# Patient Record
Sex: Male | Born: 1980
Health system: Southern US, Community
[De-identification: ages and names within clinical notes are randomized; demographics above are authoritative.]

## PROBLEM LIST (undated history)

## (undated) DIAGNOSIS — R42 Dizziness and giddiness: Secondary | ICD-10-CM

## (undated) DIAGNOSIS — L02419 Cutaneous abscess of limb, unspecified: Secondary | ICD-10-CM

## (undated) DIAGNOSIS — L03119 Cellulitis of unspecified part of limb: Secondary | ICD-10-CM

## (undated) DIAGNOSIS — D509 Iron deficiency anemia, unspecified: Secondary | ICD-10-CM

## (undated) DIAGNOSIS — K4090 Unilateral inguinal hernia, without obstruction or gangrene, not specified as recurrent: Secondary | ICD-10-CM

## (undated) DIAGNOSIS — E538 Deficiency of other specified B group vitamins: Secondary | ICD-10-CM

## (undated) DIAGNOSIS — Z21 Asymptomatic human immunodeficiency virus [HIV] infection status: Secondary | ICD-10-CM

## (undated) DIAGNOSIS — K219 Gastro-esophageal reflux disease without esophagitis: Secondary | ICD-10-CM

## (undated) DIAGNOSIS — B20 Human immunodeficiency virus [HIV] disease: Secondary | ICD-10-CM

## (undated) HISTORY — DX: Asymptomatic human immunodeficiency virus (hiv) infection status: Z21

## (undated) HISTORY — PX: FRACTURE SURGERY: SHX138

## (undated) HISTORY — DX: Human immunodeficiency virus (HIV) disease: B20

---
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
## 2015-11-25 ENCOUNTER — Encounter (HOSPITAL_COMMUNITY)
Admission: EM | Disposition: A | Discharge status: Skilled Nursing Facility | Payer: Self-pay | Source: Home / Self Care | Attending: Internal Medicine

## 2015-11-25 ENCOUNTER — Encounter (HOSPITAL_COMMUNITY): Payer: Self-pay | Admitting: *Deleted

## 2015-11-25 ENCOUNTER — Observation Stay (HOSPITAL_COMMUNITY): Payer: Medicaid Other | Admitting: Anesthesiology

## 2015-11-25 ENCOUNTER — Inpatient Hospital Stay (HOSPITAL_COMMUNITY)
Admission: EM | Admit: 2015-11-25 | Discharge: 2015-11-28 | DRG: 494 | Disposition: A | Discharge status: Skilled Nursing Facility | Payer: Medicaid Other | Attending: Internal Medicine | Admitting: Internal Medicine

## 2015-11-25 ENCOUNTER — Emergency Department (HOSPITAL_COMMUNITY): Payer: Medicaid Other

## 2015-11-25 ENCOUNTER — Observation Stay (HOSPITAL_COMMUNITY): Payer: Medicaid Other

## 2015-11-25 ENCOUNTER — Inpatient Hospital Stay (HOSPITAL_COMMUNITY)
Admission: AD | Admit: 2015-11-25 | Payer: No Typology Code available for payment source | Source: Ambulatory Visit | Admitting: Orthopedic Surgery

## 2015-11-25 DIAGNOSIS — Z21 Asymptomatic human immunodeficiency virus [HIV] infection status: Secondary | ICD-10-CM | POA: Diagnosis present

## 2015-11-25 DIAGNOSIS — B351 Tinea unguium: Secondary | ICD-10-CM | POA: Diagnosis present

## 2015-11-25 DIAGNOSIS — K4090 Unilateral inguinal hernia, without obstruction or gangrene, not specified as recurrent: Secondary | ICD-10-CM | POA: Insufficient documentation

## 2015-11-25 DIAGNOSIS — S82141A Displaced bicondylar fracture of right tibia, initial encounter for closed fracture: Principal | ICD-10-CM | POA: Diagnosis present

## 2015-11-25 DIAGNOSIS — S82131A Displaced fracture of medial condyle of right tibia, initial encounter for closed fracture: Secondary | ICD-10-CM | POA: Diagnosis present

## 2015-11-25 DIAGNOSIS — R42 Dizziness and giddiness: Secondary | ICD-10-CM | POA: Diagnosis present

## 2015-11-25 DIAGNOSIS — M79604 Pain in right leg: Secondary | ICD-10-CM | POA: Diagnosis present

## 2015-11-25 DIAGNOSIS — S82143A Displaced bicondylar fracture of unspecified tibia, initial encounter for closed fracture: Secondary | ICD-10-CM | POA: Diagnosis present

## 2015-11-25 DIAGNOSIS — S82191A Other fracture of upper end of right tibia, initial encounter for closed fracture: Secondary | ICD-10-CM | POA: Diagnosis present

## 2015-11-25 DIAGNOSIS — Z419 Encounter for procedure for purposes other than remedying health state, unspecified: Secondary | ICD-10-CM

## 2015-11-25 DIAGNOSIS — I872 Venous insufficiency (chronic) (peripheral): Secondary | ICD-10-CM | POA: Diagnosis present

## 2015-11-25 DIAGNOSIS — Y92410 Unspecified street and highway as the place of occurrence of the external cause: Secondary | ICD-10-CM | POA: Diagnosis not present

## 2015-11-25 DIAGNOSIS — M79661 Pain in right lower leg: Secondary | ICD-10-CM

## 2015-11-25 DIAGNOSIS — S82209A Unspecified fracture of shaft of unspecified tibia, initial encounter for closed fracture: Secondary | ICD-10-CM

## 2015-11-25 DIAGNOSIS — B20 Human immunodeficiency virus [HIV] disease: Secondary | ICD-10-CM | POA: Insufficient documentation

## 2015-11-25 HISTORY — PX: EXTERNAL FIXATION LEG: SHX1549

## 2015-11-25 HISTORY — DX: Dizziness and giddiness: R42

## 2015-11-25 LAB — COMPREHENSIVE METABOLIC PANEL WITH GFR
ALT: 27 U/L (ref 17–63)
AST: 63 U/L — ABNORMAL HIGH (ref 15–41)
Albumin: 3.6 g/dL (ref 3.5–5.0)
Alkaline Phosphatase: 71 U/L (ref 38–126)
Anion gap: 8 (ref 5–15)
BUN: 11 mg/dL (ref 6–20)
CO2: 27 mmol/L (ref 22–32)
Calcium: 9.1 mg/dL (ref 8.9–10.3)
Chloride: 102 mmol/L (ref 101–111)
Creatinine, Ser: 0.9 mg/dL (ref 0.61–1.24)
GFR calc Af Amer: >60 mL/min
GFR calc non Af Amer: >60 mL/min
Glucose, Bld: 114 mg/dL — ABNORMAL HIGH (ref 65–99)
Potassium: 5 mmol/L (ref 3.5–5.1)
Sodium: 137 mmol/L (ref 135–145)
Total Bilirubin: 0.8 mg/dL (ref 0.3–1.2)
Total Protein: 7.9 g/dL (ref 6.5–8.1)

## 2015-11-25 LAB — CBC WITH DIFFERENTIAL/PLATELET
Basophils Absolute: 0 K/uL (ref 0.0–0.1)
Basophils Relative: 0 %
Eosinophils Absolute: 0.1 K/uL (ref 0.0–0.7)
Eosinophils Relative: 1 %
HCT: 39.5 % (ref 39.0–52.0)
Hemoglobin: 12 g/dL — ABNORMAL LOW (ref 13.0–17.0)
Lymphocytes Relative: 16 %
Lymphs Abs: 1.3 K/uL (ref 0.7–4.0)
MCH: 24.8 pg — ABNORMAL LOW (ref 26.0–34.0)
MCHC: 30.4 g/dL (ref 30.0–36.0)
MCV: 81.6 fL (ref 78.0–100.0)
Monocytes Absolute: 0.6 K/uL (ref 0.1–1.0)
Monocytes Relative: 7 %
Neutro Abs: 6 K/uL (ref 1.7–7.7)
Neutrophils Relative %: 76 %
Platelets: 244 K/uL (ref 150–400)
RBC: 4.84 MIL/uL (ref 4.22–5.81)
RDW: 13.5 % (ref 11.5–15.5)
WBC: 7.9 K/uL (ref 4.0–10.5)

## 2015-11-25 LAB — BRAIN NATRIURETIC PEPTIDE: B Natriuretic Peptide: 39.8 pg/mL (ref 0.0–100.0)

## 2015-11-25 SURGERY — EXTERNAL FIXATION, LOWER EXTREMITY
Anesthesia: General | Site: Leg Lower | Laterality: Right

## 2015-11-25 MED ORDER — CEFAZOLIN SODIUM-DEXTROSE 2-3 GM-% IV SOLR
2.0000 g | Freq: Four times a day (QID) | INTRAVENOUS | Status: AC
  Administered 2015-11-25 – 2015-11-26 (×3): 2 g via INTRAVENOUS
  Filled 2015-11-25 (×3): qty 50

## 2015-11-25 MED ORDER — ONDANSETRON HCL 4 MG PO TABS: 4.0000 mg | ORAL_TABLET | Freq: Four times a day (QID) | ORAL | Status: DC | PRN

## 2015-11-25 MED ORDER — TETANUS-DIPHTH-ACELL PERTUSSIS 5-2.5-18.5 LF-MCG/0.5 IM SUSP
0.5000 mL | Freq: Once | INTRAMUSCULAR | Status: AC
  Administered 2015-11-25: 0.5 mL via INTRAMUSCULAR
  Filled 2015-11-25: qty 0.5

## 2015-11-25 MED ORDER — FENTANYL CITRATE (PF) 100 MCG/2ML IJ SOLN
50.0000 ug | Freq: Once | INTRAMUSCULAR | Status: AC
Start: 2015-11-25 — End: 2015-11-25
  Administered 2015-11-25: 50 ug via INTRAVENOUS
  Filled 2015-11-25: qty 2

## 2015-11-25 MED ORDER — CEFAZOLIN SODIUM-DEXTROSE 2-3 GM-% IV SOLR: 2.0000 g | INTRAVENOUS | Status: DC

## 2015-11-25 MED ORDER — FENTANYL CITRATE (PF) 100 MCG/2ML IJ SOLN: 25.0000 ug | INTRAMUSCULAR | Status: DC | PRN

## 2015-11-25 MED ORDER — METHOCARBAMOL 500 MG PO TABS
500.0000 mg | ORAL_TABLET | Freq: Four times a day (QID) | ORAL | Status: DC | PRN
  Administered 2015-11-25 – 2015-11-28 (×9): 500 mg via ORAL
  Filled 2015-11-25 (×8): qty 1

## 2015-11-25 MED ORDER — SUCCINYLCHOLINE CHLORIDE 200 MG/10ML IV SOSY
PREFILLED_SYRINGE | INTRAVENOUS | Status: DC | PRN
  Administered 2015-11-25: 120 mg via INTRAVENOUS

## 2015-11-25 MED ORDER — METHOCARBAMOL 1000 MG/10ML IJ SOLN
500.0000 mg | Freq: Four times a day (QID) | INTRAVENOUS | Status: DC | PRN
  Filled 2015-11-25: qty 5

## 2015-11-25 MED ORDER — OXYCODONE HCL 5 MG PO TABS
5.0000 mg | ORAL_TABLET | ORAL | Status: DC | PRN
  Administered 2015-11-25 – 2015-11-27 (×10): 10 mg via ORAL
  Filled 2015-11-25 (×10): qty 2

## 2015-11-25 MED ORDER — ATROPINE SULFATE 1 MG/ML IJ SOLN
INTRAMUSCULAR | Status: DC | PRN
  Administered 2015-11-25: 1 mg via INTRAVENOUS

## 2015-11-25 MED ORDER — DEXAMETHASONE SODIUM PHOSPHATE 4 MG/ML IJ SOLN
INTRAMUSCULAR | Status: DC | PRN
  Administered 2015-11-25: 4 mg via INTRAVENOUS

## 2015-11-25 MED ORDER — ONDANSETRON HCL 4 MG/2ML IJ SOLN: 4.0000 mg | Freq: Four times a day (QID) | INTRAMUSCULAR | Status: DC | PRN

## 2015-11-25 MED ORDER — HYDROMORPHONE HCL 1 MG/ML IJ SOLN
INTRAMUSCULAR | Status: AC
  Filled 2015-11-25: qty 2

## 2015-11-25 MED ORDER — ALBUTEROL SULFATE HFA 108 (90 BASE) MCG/ACT IN AERS
INHALATION_SPRAY | RESPIRATORY_TRACT | Status: DC | PRN
  Administered 2015-11-25: 2 via RESPIRATORY_TRACT

## 2015-11-25 MED ORDER — LIDOCAINE HCL (CARDIAC) 20 MG/ML IV SOLN
INTRAVENOUS | Status: DC | PRN
  Administered 2015-11-25: 80 mg via INTRAVENOUS

## 2015-11-25 MED ORDER — ALBUTEROL SULFATE HFA 108 (90 BASE) MCG/ACT IN AERS
INHALATION_SPRAY | RESPIRATORY_TRACT | Status: AC
  Filled 2015-11-25: qty 6.7

## 2015-11-25 MED ORDER — METOCLOPRAMIDE HCL 5 MG PO TABS: 5.0000 mg | ORAL_TABLET | Freq: Three times a day (TID) | ORAL | Status: DC | PRN

## 2015-11-25 MED ORDER — MORPHINE SULFATE (PF) 4 MG/ML IV SOLN
4.0000 mg | Freq: Once | INTRAVENOUS | Status: AC
  Administered 2015-11-25: 4 mg via INTRAVENOUS
  Filled 2015-11-25: qty 1

## 2015-11-25 MED ORDER — PROPOFOL 10 MG/ML IV BOLUS
INTRAVENOUS | Status: AC
  Filled 2015-11-25: qty 20

## 2015-11-25 MED ORDER — METHOCARBAMOL 500 MG PO TABS
ORAL_TABLET | ORAL | Status: AC
  Filled 2015-11-25: qty 1

## 2015-11-25 MED ORDER — ONDANSETRON HCL 4 MG/2ML IJ SOLN
INTRAMUSCULAR | Status: DC | PRN
  Administered 2015-11-25: 4 mg via INTRAVENOUS

## 2015-11-25 MED ORDER — MIDAZOLAM HCL 2 MG/2ML IJ SOLN
INTRAMUSCULAR | Status: AC
  Filled 2015-11-25: qty 2

## 2015-11-25 MED ORDER — 0.9 % SODIUM CHLORIDE (POUR BTL) OPTIME
TOPICAL | Status: DC | PRN
  Administered 2015-11-25: 1000 mL

## 2015-11-25 MED ORDER — METOCLOPRAMIDE HCL 5 MG/ML IJ SOLN: 5.0000 mg | Freq: Three times a day (TID) | INTRAMUSCULAR | Status: DC | PRN

## 2015-11-25 MED ORDER — PROMETHAZINE HCL 25 MG/ML IJ SOLN: 6.2500 mg | INTRAMUSCULAR | Status: DC | PRN

## 2015-11-25 MED ORDER — ACETAMINOPHEN 650 MG RE SUPP: 650.0000 mg | Freq: Four times a day (QID) | RECTAL | Status: DC | PRN

## 2015-11-25 MED ORDER — FENTANYL CITRATE (PF) 250 MCG/5ML IJ SOLN
INTRAMUSCULAR | Status: AC
  Filled 2015-11-25: qty 5

## 2015-11-25 MED ORDER — ACETAMINOPHEN 500 MG PO TABS: 1000.0000 mg | ORAL_TABLET | Freq: Once | ORAL | Status: DC

## 2015-11-25 MED ORDER — LIDOCAINE HCL (CARDIAC) 20 MG/ML IV SOLN
INTRAVENOUS | Status: AC
  Filled 2015-11-25: qty 5

## 2015-11-25 MED ORDER — DEXTROSE-NACL 5-0.45 % IV SOLN: 100.0000 mL/h | INTRAVENOUS | Status: DC

## 2015-11-25 MED ORDER — LACTATED RINGERS IV SOLN
INTRAVENOUS | Status: DC | PRN
  Administered 2015-11-25: 15:00:00 via INTRAVENOUS

## 2015-11-25 MED ORDER — FENTANYL CITRATE (PF) 100 MCG/2ML IJ SOLN
INTRAMUSCULAR | Status: DC | PRN
  Administered 2015-11-25: 100 ug via INTRAVENOUS
  Administered 2015-11-25 (×3): 50 ug via INTRAVENOUS

## 2015-11-25 MED ORDER — POTASSIUM CHLORIDE IN NACL 20-0.45 MEQ/L-% IV SOLN
INTRAVENOUS | Status: DC
  Administered 2015-11-26: 100 mL/h via INTRAVENOUS
  Filled 2015-11-25 (×6): qty 1000

## 2015-11-25 MED ORDER — POLYETHYLENE GLYCOL 3350 17 G PO PACK: 17.0000 g | PACK | Freq: Every day | ORAL | Status: DC | PRN

## 2015-11-25 MED ORDER — DEXTROSE 5 % IV SOLN
3.0000 g | Freq: Once | INTRAVENOUS | Status: AC
  Administered 2015-11-25: 3 g via INTRAVENOUS
  Filled 2015-11-25 (×2): qty 3000

## 2015-11-25 MED ORDER — ACETAMINOPHEN 325 MG PO TABS
650.0000 mg | ORAL_TABLET | Freq: Four times a day (QID) | ORAL | Status: DC | PRN
  Administered 2015-11-26: 650 mg via ORAL
  Filled 2015-11-25: qty 2

## 2015-11-25 MED ORDER — DOCUSATE SODIUM 100 MG PO CAPS
100.0000 mg | ORAL_CAPSULE | Freq: Two times a day (BID) | ORAL | Status: DC
  Administered 2015-11-25 – 2015-11-26 (×3): 100 mg via ORAL
  Filled 2015-11-25 (×3): qty 1

## 2015-11-25 MED ORDER — HYDROMORPHONE HCL 1 MG/ML IJ SOLN
0.2500 mg | INTRAMUSCULAR | Status: DC | PRN
  Administered 2015-11-25 (×4): 0.5 mg via INTRAVENOUS

## 2015-11-25 MED ORDER — OXYCODONE HCL 5 MG PO TABS
ORAL_TABLET | ORAL | Status: AC
  Filled 2015-11-25: qty 2

## 2015-11-25 MED ORDER — PROPOFOL 10 MG/ML IV BOLUS
INTRAVENOUS | Status: DC | PRN
  Administered 2015-11-25: 200 mg via INTRAVENOUS

## 2015-11-25 MED ORDER — MECLIZINE HCL 12.5 MG PO TABS
12.5000 mg | ORAL_TABLET | Freq: Three times a day (TID) | ORAL | Status: DC | PRN
  Filled 2015-11-25: qty 1

## 2015-11-25 MED ORDER — MIDAZOLAM HCL 5 MG/5ML IJ SOLN
INTRAMUSCULAR | Status: DC | PRN
  Administered 2015-11-25: 2 mg via INTRAVENOUS

## 2015-11-25 MED ORDER — HYDROMORPHONE HCL 1 MG/ML IJ SOLN
1.0000 mg | INTRAMUSCULAR | Status: DC | PRN
  Administered 2015-11-25 – 2015-11-26 (×3): 1 mg via INTRAVENOUS
  Filled 2015-11-25 (×3): qty 1

## 2015-11-25 MED ORDER — ACETAMINOPHEN 325 MG PO TABS
650.0000 mg | ORAL_TABLET | Freq: Four times a day (QID) | ORAL | Status: DC | PRN
  Administered 2015-11-27 – 2015-11-28 (×3): 650 mg via ORAL
  Filled 2015-11-25 (×3): qty 2

## 2015-11-25 MED ORDER — OXYCODONE-ACETAMINOPHEN 5-325 MG PO TABS: 1.0000 | ORAL_TABLET | ORAL | Status: DC | PRN

## 2015-11-25 SURGICAL SUPPLY — 35 items
BANDAGE ELASTIC 6 VELCRO ST LF (GAUZE/BANDAGES/DRESSINGS) ×3 IMPLANT
BNDG COHESIVE 6X5 TAN STRL LF (GAUZE/BANDAGES/DRESSINGS) ×3 IMPLANT
BNDG CONFORM 3 STRL LF (GAUZE/BANDAGES/DRESSINGS) ×3 IMPLANT
BNDG ELASTIC 6X15 VLCR STRL LF (GAUZE/BANDAGES/DRESSINGS) ×3 IMPLANT
CHLORAPREP W/TINT 26ML (MISCELLANEOUS) ×9 IMPLANT
COVER SURGICAL LIGHT HANDLE (MISCELLANEOUS) ×3 IMPLANT
DRAPE C-ARM 42X72 X-RAY (DRAPES) ×3 IMPLANT
DRAPE C-ARMOR (DRAPES) ×3 IMPLANT
DRAPE IMP U-DRAPE 54X76 (DRAPES) ×3 IMPLANT
DRAPE U-SHAPE 47X51 STRL (DRAPES) ×3 IMPLANT
DRSG ADAPTIC 3X8 NADH LF (GAUZE/BANDAGES/DRESSINGS) ×3 IMPLANT
DRSG MEPILEX BORDER 4X4 (GAUZE/BANDAGES/DRESSINGS) IMPLANT
DRSG MEPITEL 4X7.2 (GAUZE/BANDAGES/DRESSINGS) IMPLANT
GAUZE SPONGE 4X4 12PLY STRL (GAUZE/BANDAGES/DRESSINGS) ×3 IMPLANT
GAUZE XEROFORM 5X9 LF (GAUZE/BANDAGES/DRESSINGS) ×3 IMPLANT
GLOVE BIO SURGEON STRL SZ7 (GLOVE) ×9 IMPLANT
GLOVE BIO SURGEON STRL SZ7.5 (GLOVE) IMPLANT
GLOVE BIOGEL PI IND STRL 7.0 (GLOVE) ×1 IMPLANT
GLOVE BIOGEL PI INDICATOR 7.0 (GLOVE) ×2
GLOVE NEODERM STER SZ 7 (GLOVE) ×9 IMPLANT
GLOVE SURG SS PI 6.0 STRL IVOR (GLOVE) ×3 IMPLANT
KIT ROOM TURNOVER OR (KITS) ×3 IMPLANT
NS IRRIG 1000ML POUR BTL (IV SOLUTION) ×3 IMPLANT
PAD ARMBOARD 7.5X6 YLW CONV (MISCELLANEOUS) ×6 IMPLANT
PAD CAST 4YDX4 CTTN HI CHSV (CAST SUPPLIES) ×1 IMPLANT
PADDING CAST COTTON 4X4 STRL (CAST SUPPLIES) ×2
PIN APEX 180X50X5XST SLF (PIN) ×2 IMPLANT
PIN APEX 5X180 (PIN) ×4
PIN APEX 6.0X250MM EXFIX (EXFIX) ×6 IMPLANT
PIN CLAMP 5H 30DEG POST (EXFIX) ×6 IMPLANT
ROD HOFFMANN3 CONNECT 11X500 (EXFIX) ×6 IMPLANT
ROD TO ROD COUPLING EXFIX (EXFIX) ×12 IMPLANT
STOCKINETTE IMPERVIOUS LG (DRAPES) ×3 IMPLANT
SUT ETHILON 3 0 PS 1 (SUTURE) IMPLANT
TOWEL OR 17X24 6PK STRL BLUE (TOWEL DISPOSABLE) ×3 IMPLANT

## 2015-11-25 NOTE — Progress Notes (Signed)
One bag of belongings taken to PACU. Sister's name Vicie Muttersmy Peyton [phone]

## 2015-11-25 NOTE — ED Notes (Signed)
With 2mg  morphine pt became hypotensive at 68/57, BP repeated and cuff adjusted, BP remains the same. MD Little made aware.
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
## 2015-11-25 NOTE — Anesthesia Procedure Notes (Signed)
Procedure Name: Intubation Date/Time: 11/25/2015 3:19 PM Performed by: Renford DillsMULLINS, Kariel Skillman L Pre-anesthesia Checklist: Patient identified, Emergency Drugs available, Suction available and Patient being monitored Patient Re-evaluated:Patient Re-evaluated prior to inductionOxygen Delivery Method: Circle system utilized Preoxygenation: Pre-oxygenation with 100% oxygen Intubation Type: IV induction, Cricoid Pressure applied and Rapid sequence Laryngoscope Size: Miller and 2 Grade View: Grade II Tube type: Oral Tube size: 7.5 mm Number of attempts: 2 Airway Equipment and Method: Stylet Placement Confirmation: ETT inserted through vocal cords under direct vision,  positive ETCO2,  CO2 detector and breath sounds checked- equal and bilateral Secured at: 23 cm Tube secured with: Tape Dental Injury: Teeth and Oropharynx as per pre-operative assessment
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
## 2015-11-25 NOTE — Transfer of Care (Signed)
Immediate Anesthesia Transfer of Care Note  Patient: Todd Parrish  Procedure(s) Performed: Procedure(s): EXTERNAL FIXATION LEG (Right)  Patient Location: PACU  Anesthesia Type:General  Level of Consciousness: awake  Airway & Oxygen Therapy: Patient Spontanous Breathing  Post-op Assessment: Report given to RN and Post -op Vital signs reviewed and stable  Post vital signs: stable  Last Vitals:  Filed Vitals:   11/25/15 1300 11/25/15 1453  BP: 113/74 114/63  Pulse:    Temp:    Resp: 15     Complications: No apparent anesthesia complications
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
## 2015-11-25 NOTE — Op Note (Signed)
11/25/2015  4:14 PM  PATIENT:  Todd Parrish    PRE-OPERATIVE DIAGNOSIS:  fracture tibia plateau  POST-OPERATIVE DIAGNOSIS:  Same  PROCEDURE:  EXTERNAL FIXATION LEG  SURGEON:  Helmi Hechavarria, Jewel BaizeIMOTHY D, MD  ASSISTANT: Janalee DaneBrittney Kelly, PA-C, She was present and scrubbed throughout the case, critical for completion in a timely fashion, and for retraction, instrumentation, and closure.   ANESTHESIA:   gen  PREOPERATIVE INDICATIONS:  Todd Parrish is a  34 y.o. male with a diagnosis of fracture tibia plateau who failed conservative measures and elected for surgical management.    The risks benefits and alternatives were discussed with the patient preoperatively including but not limited to the risks of infection, bleeding, nerve injury, cardiopulmonary complications, the need for revision surgery, among others, and the patient was willing to proceed.  OPERATIVE IMPLANTS: Stryker ex fix  OPERATIVE FINDINGS: unstable plateau, soft compartments  BLOOD LOSS: min  COMPLICATIONS: none  TOURNIQUET TIME: none  OPERATIVE PROCEDURE:  Patient was identified in the preoperative holding area and site was marked by me He was transported to the operating theater and placed on the table in supine position taking care to pad all bony prominences. After a preincinduction time out anesthesia was induced. The right lower extremity was prepped and draped in normal sterile fashion and a pre-incision timeout was performed. He received ancef for preoperative antibiotics.   I took multiple x-rays of his tibia to confirm the inferior aspect of the fracture and compared these with the CAT scan that was obtained. This was done to be able to keep the external for up to the fracture site but away from his edema is much as possible.  I placed 2 pins bicortical into the tibia from anterior to posterior these were 5 mm ex-fix pins.  I placed 25 mm ex-fix pins into the femur from anterior to posterior to  multiple x-rays of both confirming appropriate location and placement of pins. Next I assembled a multiplane frame spanning his knee. I then pulled his lateral side out to length I took multiple x-rays both AP and laterals confirming appropriate length and tension on the external fixator I final tightened everything was happy with the reduction.  Sterile dressings were applied and he was taking the PACU in stable condition.  POST OPERATIVE PLAN: NWB RLE, Lovenox    This note was generated using a template and dragon dictation system. In light of that, I have reviewed the note and all aspects of it are applicable to this case. Any dictation errors are due to the computerized dictation system.

## 2015-11-25 NOTE — Anesthesia Preprocedure Evaluation (Addendum)
Anesthesia Evaluation  Patient identified by MRN, date of birth, ID band Patient awake    Reviewed: Allergy & Precautions, H&P , NPO status , Patient's Chart, lab work & pertinent test results  Airway Mallampati: II  TM Distance: >3 FB Neck ROM: full    Dental  (+) Teeth Intact, Dental Advidsory Given   Pulmonary neg pulmonary ROS,    breath sounds clear to auscultation       Cardiovascular negative cardio ROS   Rhythm:regular Rate:Normal     Neuro/Psych negative neurological ROS  negative psych ROS   GI/Hepatic negative GI ROS, Neg liver ROS,   Endo/Other  Morbid obesity  Renal/GU negative Renal ROS     Musculoskeletal   Abdominal   Peds  Hematology   Anesthesia Other Findings   Reproductive/Obstetrics negative OB ROS                            Anesthesia Physical Anesthesia Plan  ASA: II and emergent  Anesthesia Plan: General ETT   Post-op Pain Management:    Induction: Intravenous, Rapid sequence and Cricoid pressure planned  Airway Management Planned:   Additional Equipment:   Intra-op Plan:   Post-operative Plan: Extubation in OR  Informed Consent: I have reviewed the patients History and Physical, chart, labs and discussed the procedure including the risks, benefits and alternatives for the proposed anesthesia with the patient or authorized representative who has indicated his/her understanding and acceptance.   Dental Advisory Given  Plan Discussed with: Anesthesiologist and Surgeon  Anesthesia Plan Comments:        Anesthesia Quick Evaluation

## 2015-11-25 NOTE — ED Notes (Signed)
Ortho tech paged  
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
## 2015-11-26 ENCOUNTER — Inpatient Hospital Stay (HOSPITAL_COMMUNITY): Payer: Medicaid Other

## 2015-11-26 ENCOUNTER — Encounter (HOSPITAL_COMMUNITY): Payer: Self-pay

## 2015-11-26 DIAGNOSIS — S82141A Displaced bicondylar fracture of right tibia, initial encounter for closed fracture: Principal | ICD-10-CM

## 2015-11-26 DIAGNOSIS — K4090 Unilateral inguinal hernia, without obstruction or gangrene, not specified as recurrent: Secondary | ICD-10-CM

## 2015-11-26 DIAGNOSIS — Y92410 Unspecified street and highway as the place of occurrence of the external cause: Secondary | ICD-10-CM

## 2015-11-26 DIAGNOSIS — R600 Localized edema: Secondary | ICD-10-CM

## 2015-11-26 LAB — BASIC METABOLIC PANEL
ANION GAP: 8 (ref 5–15)
BUN: 8 mg/dL (ref 6–20)
CALCIUM: 8.4 mg/dL — AB (ref 8.9–10.3)
CO2: 25 mmol/L (ref 22–32)
Chloride: 101 mmol/L (ref 101–111)
Creatinine, Ser: 0.82 mg/dL (ref 0.61–1.24)
Glucose, Bld: 117 mg/dL — ABNORMAL HIGH (ref 65–99)
POTASSIUM: 4.7 mmol/L (ref 3.5–5.1)
Sodium: 134 mmol/L — ABNORMAL LOW (ref 135–145)

## 2015-11-26 LAB — CBC
HCT: 34 % — ABNORMAL LOW (ref 39.0–52.0)
HEMOGLOBIN: 10.1 g/dL — AB (ref 13.0–17.0)
MCH: 24.2 pg — AB (ref 26.0–34.0)
MCHC: 29.7 g/dL — ABNORMAL LOW (ref 30.0–36.0)
MCV: 81.5 fL (ref 78.0–100.0)
Platelets: 189 10*3/uL (ref 150–400)
RBC: 4.17 MIL/uL — ABNORMAL LOW (ref 4.22–5.81)
RDW: 13.7 % (ref 11.5–15.5)
WBC: 10.9 10*3/uL — ABNORMAL HIGH (ref 4.0–10.5)

## 2015-11-26 MED ORDER — HYDROMORPHONE HCL 1 MG/ML IJ SOLN
0.5000 mg | INTRAMUSCULAR | Status: DC | PRN
  Administered 2015-11-26 – 2015-11-27 (×4): 0.5 mg via INTRAVENOUS
  Filled 2015-11-26 (×4): qty 1

## 2015-11-26 MED ORDER — ENOXAPARIN SODIUM 40 MG/0.4ML ~~LOC~~ SOLN
40.0000 mg | SUBCUTANEOUS | Status: DC
  Administered 2015-11-26 – 2015-11-28 (×3): 40 mg via SUBCUTANEOUS
  Filled 2015-11-26 (×3): qty 0.4
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
## 2015-11-26 NOTE — Clinical Social Work Note (Addendum)
CSW met with patient and also talked to patient's sister Amy [phone] about SNF search process.  Patient's sister stated she has a friend at [facility] and would like to see if patient can get into facility.  CSW informed patient and his sister that because patient is a self-pay he will have limited options on what SNF he can go to.  CSW explained about what to expect and informed patient that a SNF may not be in the county.  Patient and his sister are aware of the situation and are in agreement for CSW to fax patient out for SNF placement.  Patient has two young sons who are currently staying with a family friend, patient does not have any support at home and PT are recommending SNF.  CSW to continue to follow patient's progress and begin SNF search process.  Jones Broom. Horine, MSW, [place] 11/26/2015 8:00 PM
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
## 2015-11-26 NOTE — Clinical Social Work Placement (Signed)
   CLINICAL SOCIAL WORK PLACEMENT  NOTE  Date:  11/26/2015  Patient Details  Name: Todd Parrish MRN: [id number] Date of Birth: 1981/10/21  Clinical Social Work is seeking post-discharge placement for this patient at the Skilled  Nursing Facility level of care (*CSW will initial, date and re-position this form in  chart as items are completed):  Yes   Patient/family provided with [facility] Clinical Social Work Department's list of facilities offering this level of care within the geographic area requested by the patient (or if unable, by the patient's family).  Yes   Patient/family informed of their freedom to choose among providers that offer the needed level of care, that participate in Medicare, Medicaid or managed care program needed by the patient, have an available bed and are willing to accept the patient.  Yes   Patient/family informed of [facility]'s ownership interest in [facility] and [facility], as well as of the fact that they are under no obligation to receive care at these facilities.  PASRR submitted to EDS on 11/26/15     PASRR number received on 11/26/15     Existing PASRR number confirmed on       FL2 transmitted to all facilities in geographic area requested by pt/family on 11/26/15     FL2 transmitted to all facilities within larger geographic area on 11/26/15     Patient informed that his/her managed care company has contracts with or will negotiate with certain facilities, including the following:            Patient/family informed of bed offers received.  Patient chooses bed at       Physician recommends and patient chooses bed at      Patient to be transferred to   on  .  Patient to be transferred to facility by       Patient family notified on   of transfer.  Name of family member notified:        PHYSICIAN Please sign FL2     Additional Comment:    _______________________________________________ Darleene CleaverAnterhaus, Ryken Paschal R,  LCSWA 11/26/2015, 11:03 PM

## 2015-11-26 NOTE — Care Management (Signed)
Utilization review completed. Shatora Weatherbee, RN Case Manager [phone]. 

## 2015-11-26 NOTE — NC FL2 (Signed)
[place] MEDICAID FL2 LEVEL OF CARE SCREENING TOOL     IDENTIFICATION  Patient Name: Todd Parrish Birthdate: 01-13-1981 Sex: male Admission Date (Current Location): 11/25/2015  [facility] and [place] Number: Producer, television/film/video and Address:  The . [facility], [address], [place], [place] [address]      Provider Number: [id number]  Attending Physician Name and Address:  Burns Spain, MD  Relative Name and Phone Number:  Patient's sister Amy [phone]    Current Level of Care: Hospital Recommended Level of Care: Skilled Nursing Facility Prior Approval Number:    Date Approved/Denied:   PASRR Number: [id number]  Discharge Plan: SNF    Current Diagnoses: Patient Active Problem List   Diagnosis Date Noted  . Right medial tibial plateau fracture 11/25/2015  . Tibial plateau fracture 11/25/2015    Orientation ACTIVITIES/SOCIAL BLADDER RESPIRATION    Self, Time, Situation, Place    Continent Normal  BEHAVIORAL SYMPTOMS/MOOD NEUROLOGICAL BOWEL NUTRITION STATUS      Continent Diet  PHYSICIAN VISITS COMMUNICATION OF NEEDS Height & Weight Skin    Verbally     Surgical wounds          AMBULATORY STATUS RESPIRATION    Supervision limited Normal      Personal Care Assistance Level of Assistance  Bathing, Dressing Bathing Assistance: Limited assistance   Dressing Assistance: Limited assistance      Functional Limitations Info                SPECIAL CARE FACTORS FREQUENCY  PT (By licensed PT)     PT Frequency: 5x             Additional Factors Info  Code Status Code Status Info: Full             Current Medications (11/26/2015):  This is the current hospital active medication list Current Facility-Administered Medications  Medication Dose Route Frequency Provider Last Rate Last Dose  . 0.45 % NaCl with KCl 20 mEq / L infusion   Intravenous Continuous Janalee Dane, PA-C 100 mL/hr at 11/26/15 0059 100  mL/hr at 11/26/15 0059  . acetaminophen (TYLENOL) tablet 650 mg  650 mg Oral Q6H PRN Alexa Dulcy Fanny, MD       Or  . acetaminophen (TYLENOL) suppository 650 mg  650 mg Rectal Q6H PRN Alexa Dulcy Fanny, MD      . acetaminophen (TYLENOL) tablet 650 mg  650 mg Oral Q6H PRN Janalee Dane, PA-C   650 mg at 11/26/15 1501   Or  . acetaminophen (TYLENOL) suppository 650 mg  650 mg Rectal Q6H PRN Janalee Dane, PA-C      . docusate sodium (COLACE) capsule 100 mg  100 mg Oral BID Janalee Dane, PA-C   100 mg at 11/26/15 2052  . enoxaparin (LOVENOX) injection 40 mg  40 mg Subcutaneous Q24H Brittney Kelly, PA-C   40 mg at 11/26/15 1503  . HYDROmorphone (DILAUDID) injection 0.5 mg  0.5 mg Intravenous Q2H PRN Ruben Im, MD   0.5 mg at 11/26/15 2216  . meclizine (ANTIVERT) tablet 12.5 mg  12.5 mg Oral TID PRN Alexa Dulcy Fanny, MD      . methocarbamol (ROBAXIN) tablet 500 mg  500 mg Oral Q6H PRN Janalee Dane, PA-C   500 mg at 11/26/15 1726   Or  . methocarbamol (ROBAXIN) 500 mg in dextrose 5 % 50 mL IVPB  500 mg Intravenous Q6H PRN Janalee Dane, PA-C      .  metoCLOPramide (REGLAN) tablet 5-10 mg  5-10 mg Oral Q8H PRN Janalee DaneBrittney Kelly, PA-C       Or  . metoCLOPramide (REGLAN) injection 5-10 mg  5-10 mg Intravenous Q8H PRN Brittney Kelly, PA-C      . ondansetron (ZOFRAN) tablet 4 mg  4 mg Oral Q6H PRN Alexa Dulcy FannyM Richardson, MD       Or  . ondansetron (ZOFRAN) injection 4 mg  4 mg Intravenous Q6H PRN Alexa Dulcy FannyM Richardson, MD      . ondansetron (ZOFRAN) tablet 4 mg  4 mg Oral Q6H PRN Janalee DaneBrittney Kelly, PA-C       Or  . ondansetron (ZOFRAN) injection 4 mg  4 mg Intravenous Q6H PRN Janalee DaneBrittney Kelly, PA-C      . oxyCODONE (Oxy IR/ROXICODONE) immediate release tablet 5-10 mg  5-10 mg Oral Q3H PRN Janalee DaneBrittney Kelly, PA-C   10 mg at 11/26/15 2051  . polyethylene glycol (MIRALAX / GLYCOLAX) packet 17 g  17 g Oral Daily PRN Alexa Dulcy FannyM Richardson, MD         Discharge Medications: Please see discharge summary for a  list of discharge medications.  Relevant Imaging Results:  Relevant Lab Results:  Recent Labs    Additional Information Patient's SSN [id number]  Darleene Cleavernterhaus, Nekayla Heider R, LCSWA
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
## 2015-11-26 NOTE — Progress Notes (Signed)
     Subjective:  POD#1 Closed reduction with external fixation of R tibial plateau fx. Patient reports pain as moderate.  Patient was limited with mobility while working with PT today.  They recommend SNF at discharge.  Patient is ok for discharge once medically stable.  Plan for ORIF of tibial plateau 12/10/15 as inpatient.  Objective:   VITALS:   Filed Vitals:   11/25/15 1845 11/25/15 2142 11/26/15 0225 11/26/15 0702  BP: 156/90 147/78 128/69 133/65  Pulse: 66 70 84 82  Temp: 99.2 F (37.3 C) 98.9 F (37.2 C) 98 F (36.7 C) 98.8 F (37.1 C)  TempSrc: Oral Oral Oral Oral  Resp: 15 18 18 18   SpO2: 94% 98% 98% 97%    Neurologically intact ABD soft Neurovascular intact Sensation intact distally Intact pulses distally Dorsiflexion/Plantar flexion intact Incision: scant drainage External fixation to R leg  Lab Results  Component Value Date   WBC 10.9* 11/26/2015   HGB 10.1* 11/26/2015   HCT 34.0* 11/26/2015   MCV 81.5 11/26/2015   PLT 189 11/26/2015   BMET    Component Value Date/Time   NA 134* 11/26/2015 0705   K 4.7 11/26/2015 0705   CL 101 11/26/2015 0705   CO2 25 11/26/2015 0705   GLUCOSE 117* 11/26/2015 0705   BUN 8 11/26/2015 0705   CREATININE 0.82 11/26/2015 0705   CALCIUM 8.4* 11/26/2015 0705   GFRNONAA >60 11/26/2015 0705   GFRAA >60 11/26/2015 0705     Assessment/Plan: 1 Day Post-Op   Active Problems:   Right medial tibial plateau fracture   Tibial plateau fracture   Up with therapy NWB in the RLE Lovenox for DVT prophylaxis Ok for discharge to SNF once medically stable.  Will plan for ORIF of tibial plateau fracture on 12/10/15.   Todd Parrish Todd Parrish 11/26/2015, 10:31 AM Cell [phone]
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
## 2015-11-26 NOTE — Anesthesia Postprocedure Evaluation (Signed)
Anesthesia Post Note  Patient: Todd Parrish  Procedure(s) Performed: Procedure(s) (LRB): EXTERNAL FIXATION LEG (Right)  Patient location during evaluation: PACU Anesthesia Type: General Level of consciousness: sedated Pain management: pain level controlled Vital Signs Assessment: post-procedure vital signs reviewed and stable Respiratory status: spontaneous breathing and respiratory function stable Cardiovascular status: stable Anesthetic complications: no         Raksha Wolfgang DANIEL

## 2015-11-27 DIAGNOSIS — Z21 Asymptomatic human immunodeficiency virus [HIV] infection status: Secondary | ICD-10-CM

## 2015-11-27 DIAGNOSIS — S82191D Other fracture of upper end of right tibia, subsequent encounter for closed fracture with routine healing: Secondary | ICD-10-CM

## 2015-11-27 LAB — HIV 1/2 AB DIFFERENTIATION
HIV 1 Ab: POSITIVE — AB
HIV 2 Ab: NEGATIVE

## 2015-11-27 LAB — CBC
HEMATOCRIT: 33.4 % — AB (ref 39.0–52.0)
Hemoglobin: 10 g/dL — ABNORMAL LOW (ref 13.0–17.0)
MCH: 24.6 pg — AB (ref 26.0–34.0)
MCHC: 29.9 g/dL — AB (ref 30.0–36.0)
MCV: 82.1 fL (ref 78.0–100.0)
Platelets: 207 10*3/uL (ref 150–400)
RBC: 4.07 MIL/uL — ABNORMAL LOW (ref 4.22–5.81)
RDW: 13.7 % (ref 11.5–15.5)
WBC: 8.1 10*3/uL (ref 4.0–10.5)

## 2015-11-27 LAB — HIV ANTIBODY (ROUTINE TESTING W REFLEX)

## 2015-11-27 MED ORDER — OXYCODONE HCL 5 MG PO TABS
15.0000 mg | ORAL_TABLET | ORAL | Status: DC | PRN
  Administered 2015-11-27 – 2015-11-28 (×7): 15 mg via ORAL
  Filled 2015-11-27 (×7): qty 3

## 2015-11-27 MED ORDER — ENOXAPARIN SODIUM 40 MG/0.4ML ~~LOC~~ SOLN: 40.0000 mg | SUBCUTANEOUS | Status: DC

## 2015-11-27 MED ORDER — SENNOSIDES-DOCUSATE SODIUM 8.6-50 MG PO TABS
2.0000 | ORAL_TABLET | Freq: Once | ORAL | Status: AC
  Administered 2015-11-27: 2 via ORAL
  Filled 2015-11-27: qty 2

## 2015-11-27 MED ORDER — METHOCARBAMOL 500 MG PO TABS: 500.0000 mg | ORAL_TABLET | Freq: Four times a day (QID) | ORAL | Status: DC | PRN

## 2015-11-27 MED ORDER — ONDANSETRON HCL 4 MG PO TABS: 4.0000 mg | ORAL_TABLET | Freq: Four times a day (QID) | ORAL | Status: DC | PRN

## 2015-11-27 MED ORDER — ACETAMINOPHEN 325 MG PO TABS: 650.0000 mg | ORAL_TABLET | Freq: Four times a day (QID) | ORAL | Status: DC | PRN

## 2015-11-27 MED ORDER — OXYCODONE HCL 15 MG PO TABS: 15.0000 mg | ORAL_TABLET | ORAL | Status: DC | PRN

## 2015-11-27 MED ORDER — METOCLOPRAMIDE HCL 5 MG PO TABS: 5.0000 mg | ORAL_TABLET | Freq: Three times a day (TID) | ORAL | Status: DC | PRN

## 2015-11-27 MED ORDER — OXYCODONE HCL 5 MG PO TABS: 15.0000 mg | ORAL_TABLET | ORAL | Status: DC | PRN
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
## 2015-11-27 NOTE — Progress Notes (Signed)
     Subjective:  POD#2 Closed reduction and external fixation of R tibial plateau fx. Patient reports pain as moderate.  Resting comfortably in bed this morning.  Up to the chair multiple times with PT/OT yesterday.  Appropriate from ortho standpoint for discharge to SNF once bed is available. Discontinuing IV pain medication since will not be able to transfer to facility on this.  Will keep the oxycodone and muscle relaxant on board for pain control.  The importance of elevation was discussed to help with swelling.    Objective:   VITALS:   Filed Vitals:   11/26/15 0702 11/26/15 1411 11/26/15 2004 11/27/15 0401  BP: 133/65 161/77 148/71 142/62  Pulse: 82 81 52 80  Temp: 98.8 F (37.1 C) 97.7 F (36.5 C) 98.2 F (36.8 C) 98.1 F (36.7 C)  TempSrc: Oral Oral Oral Oral  Resp: 18 18 16 18   SpO2: 97% 100% 94% 97%    Neurologically intact ABD soft Neurovascular intact Sensation intact distally Intact pulses distally Dorsiflexion/Plantar flexion intact Incision: dressing C/D/I External fixation to the R leg  Lab Results  Component Value Date   WBC 8.1 11/27/2015   HGB 10.0* 11/27/2015   HCT 33.4* 11/27/2015   MCV 82.1 11/27/2015   PLT 207 11/27/2015   BMET    Component Value Date/Time   NA 134* 11/26/2015 0705   K 4.7 11/26/2015 0705   CL 101 11/26/2015 0705   CO2 25 11/26/2015 0705   GLUCOSE 117* 11/26/2015 0705   BUN 8 11/26/2015 0705   CREATININE 0.82 11/26/2015 0705   CALCIUM 8.4* 11/26/2015 0705   GFRNONAA >60 11/26/2015 0705   GFRAA >60 11/26/2015 0705     Assessment/Plan: 2 Days Post-Op   Active Problems:   Right medial tibial plateau fracture   Tibial plateau fracture   Up with therapy NWB in the RLE Lovenox for DVT prophylaxis Encouraged elevation to help with swelling of the leg.   Todd Parrish 11/27/2015, 6:28 AM Cell [phone]
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
## 2015-11-27 NOTE — Progress Notes (Signed)
Subjective:  Patient reports that the pain in his leg is a 5 this morning, improved from yesterday. However, off of IV pain medication, he says his pain is generally uncontrolled. He has no acute complaints, other than his longstanding scrotal hernia. He reports it is non-painful, but is interested in getting surgery for it in the near future.  Objective: Vital signs in last 24 hours: Filed Vitals:   11/26/15 0702 11/26/15 1411 11/26/15 2004 11/27/15 0401  BP: 133/65 161/77 148/71 142/62  Pulse: 82 81 52 80  Temp: 98.8 F (37.1 C) 97.7 F (36.5 C) 98.2 F (36.8 C) 98.1 F (36.7 C)  TempSrc: Oral Oral Oral Oral  Resp: 18 18 16 18   SpO2: 97% 100% 94% 97%   Weight change:   Intake/Output Summary (Last 24 hours) at 11/27/15 0836 Last data filed at 11/27/15 0343  Gross per 24 hour  Intake   1440 ml  Output    400 ml  Net   1040 ml   General: Sitting up recliner, no acute distress Cardiovascular: RRR, no m/r/g heard Pulmonary: Clear to auscultation bilaterally Abdomen: Soft, NT/ND. Normal bowel sounds. Extremities: 2+ edema in lower extremities.  MSK: Right leg in external fixation Genitourinary: Large, non-tender, scrotal hernia. Non-reducible. Neurological: Sensation in tact in distal extremities. Able to wiggle toes.  Psychiatric: Normal behavior and affect   Lab Results: Basic Metabolic Panel:  Recent Labs Lab 11/25/15 1134 11/26/15 0705  NA 137 134*  K 5.0 4.7  CL 102 101  CO2 27 25  GLUCOSE 114* 117*  BUN 11 8  CREATININE 0.90 0.82  CALCIUM 9.1 8.4*   CBC:  Recent Labs Lab 11/25/15 1134 11/26/15 0705 11/27/15 0439  WBC 7.9 10.9* 8.1  NEUTROABS 6.0  --   --   HGB 12.0* 10.1* 10.0*  HCT 39.5 34.0* 33.4*  MCV 81.6 81.5 82.1  PLT 244 189 207    Medications: I have reviewed the patient's current medications. Scheduled Meds: . enoxaparin (LOVENOX) injection  40 mg Subcutaneous Q24H  . senna-docusate  2 tablet Oral Once   Continuous  Infusions: . 0.45 % NaCl with KCl 20 mEq / L 100 mL/hr (11/26/15 0059)   PRN Meds:.acetaminophen **OR** acetaminophen, meclizine, methocarbamol **OR** methocarbamol (ROBAXIN)  IV, metoCLOPramide **OR** metoCLOPramide (REGLAN) injection, ondansetron **OR** ondansetron (ZOFRAN) IV, oxyCODONE Assessment/Plan:  Right medial tibial plateau fracture: External fixation on 12/5, followed by internal fixation on 12/20. PT recommends SNF, but placement could be an issue given payer status. SNF requests sent by CSW. Orthopedic surgery recommends Lovenox for DVT prophylaxis.  - Non-weight bearing  - Increased oxycodone to 15 mg q4h prn for better pain coverage - D/c'ed dilaudid  - PT/OT - Awaiting SNF placement  Scrotal Hernia: Unable to assess this AM since he had visitors. This is causing him distress since he has to physically move it every time he goes to the bathroom. Large, Non-tender, non-reducible, on exam. Interested in a  Procedure, but this could be an issue given his payer status. This hernia significantly diminishes his quality of life. He will establish with our clinic, we will continue to follow, and we will attempt to find him a surgeon once his leg fracture is addressed.  Lower leg edema: Likely represents venous insufficiency. No complaints of orthopnea, DOE, and no crackles on exam.  DVT Prophylaxis: Lovenox [place]. Will need continuing anticoagulation on discharge  Dispo: Disposition is deferred at this time, awaiting improvement of current medical problems.  Anticipated discharge in  approximately 2-3 day(s).   The patient does not have a current PCP (No primary care provider on file.) and does need an [facility] hospital follow-up appointment after discharge.  The patient does have transportation limitations that hinder transportation to clinic appointments.  .Services Needed at time of discharge: Y = Yes, Blank = No PT: Y  OT: Y  RN:   Equipment:   Other:     LOS: 2 days   Ruben Im, MD 11/27/2015, 8:36 AM

## 2015-11-27 NOTE — Progress Notes (Signed)
[facility]- MD and patient informed  CSW will continue to follow  Merlyn LotJenna Holoman, [facility] Behavioral Health CenterCSWA Clinical Social Worker [phone]
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
## 2015-11-28 ENCOUNTER — Encounter (HOSPITAL_COMMUNITY): Payer: Self-pay | Admitting: Orthopedic Surgery

## 2015-11-28 DIAGNOSIS — K4090 Unilateral inguinal hernia, without obstruction or gangrene, not specified as recurrent: Secondary | ICD-10-CM | POA: Insufficient documentation

## 2015-11-28 LAB — HIV ANTIBODY (ROUTINE TESTING W REFLEX)

## 2015-11-28 LAB — HIV 1/2 AB DIFFERENTIATION
HIV 1 AB: POSITIVE — AB
HIV 2 Ab: NEGATIVE

## 2015-11-28 LAB — RPR: RPR: NONREACTIVE

## 2015-11-28 LAB — HEPATITIS PANEL, ACUTE
HCV Ab: <0.1 s/co ratio (ref 0.0–0.9)
HEP B S AG: NEGATIVE
Hep A IgM: NEGATIVE
Hep B C IgM: NEGATIVE

## 2015-11-28 LAB — GC/CHLAMYDIA PROBE AMP (~~LOC~~) NOT AT ARMC
CHLAMYDIA, DNA PROBE: NEGATIVE
NEISSERIA GONORRHEA: NEGATIVE

## 2015-11-28 LAB — HEMOGLOBIN A1C
Hgb A1c MFr Bld: 5.6 % (ref 4.8–5.6)
Mean Plasma Glucose: 114 mg/dL

## 2015-11-28 LAB — T-HELPER CELLS (CD4) COUNT (NOT AT ARMC)
CD4 T CELL HELPER: 31 % — AB (ref 33–55)
CD4 T Cell Abs: 450 /uL (ref 400–2700)

## 2015-11-28 MED ORDER — HEPATITIS B VAC RECOMBINANT 10 MCG/ML IJ SUSP
1.0000 mL | Freq: Once | INTRAMUSCULAR | Status: AC
  Administered 2015-11-28: 10 ug via INTRAMUSCULAR
  Filled 2015-11-28: qty 1

## 2015-11-28 NOTE — Progress Notes (Signed)
[facility] Anticipated discharge date:11/28/15 Report #: [phone] Transportation by Sharin MonsPTAR- scheduled for 1pm  CSW signing off.  Merlyn LotJenna Holoman, LCSWA Clinical Social Worker [phone]

## 2015-11-28 NOTE — Discharge Instructions (Signed)
Mr. Ellwood SayersCreasey, you had a positive HIV test, and this is undergoing confirmatory testing and characterization. You will follow up in our infectious disease clinic in January.  Non-weight bearing in the right leg  Keep leg clean and dry till follow up  Elevate as much as possible to help control swelling  Please follow up in our clinic for general medical management and to discuss options for your scrotal hernia on 12/21. We will also attempt to establish you with a podiatrist.

## 2015-11-28 NOTE — Progress Notes (Signed)
OT Cancellation Note  Patient Details Name: Todd StackDevon Jermaine Wise MRN: [id number] DOB: Aug 22, 1981   Cancelled Treatment:    Reason Eval/Treat Not Completed: Other (comment);Pain limiting ability to participate;Fatigue/lethargy limiting ability to participate.  Pt. Declines skilled OT secondary to fatigue and pain.  Also states he is d/c to snf later today and prefers to start therapy there and rest for now.    Robet LeuMorris, Kyriakos Babler Lorraine, COTA/L 11/28/2015, 9:47 AM

## 2015-11-28 NOTE — Progress Notes (Signed)
   INFECTIOUS DISEASE PROGRESS NOTE  ID: Todd Parrish is a 34 y.o. male with  Active Problems:   Right medial tibial plateau fracture   Tibial plateau fracture  Subjective: Without complaints.  He states his wife used to be involved with a "drug dealer" But her tests for "STD" were negative prior to getting married.   Abtx:  Anti-infectives    Start     Dose/Rate Route Frequency Ordered Stop   11/25/15 2200  ceFAZolin (ANCEF) IVPB 2 g/50 mL premix     2 g 100 mL/hr over 30 Minutes Intravenous Every 6 hours 11/25/15 1849 11/26/15 1101   11/25/15 1615  ceFAZolin (ANCEF) IVPB 2 g/50 mL premix  Status:  Discontinued     2 g 100 mL/hr over 30 Minutes Intravenous On call to O.R. 11/25/15 1459 11/25/15 1836   11/25/15 1500  ceFAZolin (ANCEF) 3 g in dextrose 5 % 50 mL IVPB     3 g 160 mL/hr over 30 Minutes Intravenous  Once 11/25/15 1451 11/25/15 1535      Medications:  Scheduled: . enoxaparin (LOVENOX) injection  40 mg Subcutaneous Q24H    Objective: Vital signs in last 24 hours: Temp:  [98.2 F (36.8 C)-98.4 F (36.9 C)] 98.4 F (36.9 C) (12/08 0440) Pulse Rate:  [67-94] 94 (12/08 0440) Resp:  [16-18] 16 (12/08 0440) BP: (148-153)/(71-78) 148/71 mmHg (12/08 0440) SpO2:  [95 %-98 %] 95 % (12/08 0440)   General appearance: alert, cooperative and no distress Resp: clear to auscultation bilaterally Cardio: regular rate and rhythm GI: normal findings: bowel sounds normal and soft, non-tender  Lab Results  Recent Labs  11/25/15 1134 11/26/15 0705 11/27/15 0439  WBC 7.9 10.9* 8.1  HGB 12.0* 10.1* 10.0*  HCT 39.5 34.0* 33.4*  NA 137 134*  --   K 5.0 4.7  --   CL 102 101  --   CO2 27 25  --   BUN 11 8  --   CREATININE 0.90 0.82  --    Liver Panel  Recent Labs  11/25/15 1134  PROT 7.9  ALBUMIN 3.6  AST 63*  ALT 27  ALKPHOS 71  BILITOT 0.8   Sedimentation Rate No results for input(s): ESRSEDRATE in the last 72 hours. C-Reactive Protein No  results for input(s): CRP in the last 72 hours.  Microbiology: No results found for this or any previous visit (from the past 240 hour(s)).  Studies/Results: No results found.   Assessment/Plan: HIV+ Spoke with lab- his RNA will be resulted on 12-10 Will call him with results. Verified his phone # in epic.  He needs Hep B series.  He is being d/c to SNF today.   R leg fracture F/u with orthopedics.  Pain well controlled at this point.   Obesity  LLE venous insufficiency  Scrotal hernia outpt surgical f/u         Todd Parrish Infectious Diseases (pager) [phone] [email or web address] 11/28/2015, 11:22 AM  LOS: 3 days

## 2015-11-28 NOTE — Progress Notes (Signed)
Subjective:  Patient reported that he was surprised that his HIV test was positive, and is hopeful that the confirmatory testing would be negative. We explained that HIV is now a chronic disease that can be successfully managed with medications. He reported that his pain was well controlled.   Objective: Vital signs in last 24 hours: Filed Vitals:   11/27/15 0401 11/27/15 1250 11/27/15 1940 11/28/15 0440  BP: 142/62 152/78 153/71 148/71  Pulse: 80 67 81 94  Temp: 98.1 F (36.7 C) 98.2 F (36.8 C) 98.2 F (36.8 C) 98.4 F (36.9 C)  TempSrc: Oral  Oral Oral  Resp: SpO2: 97% 96% 98% 95%   Weight change:   Intake/Output Summary (Last 24 hours) at 11/28/15 1104 Last data filed at 11/28/15 8119  Gross per 24 hour  Intake    600 ml  Output   1400 ml  Net   -800 ml   General: Sitting up bed, no acute distress Cardiovascular: RRR, no m/r/g heard Pulmonary: Clear to auscultation bilaterally Abdomen: Soft, NT/ND. Normal bowel sounds. Extremities: 2+ edema in lower extremities.  MSK: Right leg in external fixation Neurological: Sensation in tact in distal extremities. Able to wiggle toes.  Psychiatric: Normal behavior and affect   Lab Results: Basic Metabolic Panel:  Recent Labs Lab 11/25/15 1134 11/26/15 0705  NA 137 134*  K 5.0 4.7  CL 102 101  CO2 27 25  GLUCOSE 114* 117*  BUN 11 8  CREATININE 0.90 0.82  CALCIUM 9.1 8.4*   CBC:  Recent Labs Lab 11/25/15 1134 11/26/15 0705 11/27/15 0439  WBC 7.9 10.9* 8.1  NEUTROABS 6.0  --   --   HGB 12.0* 10.1* 10.0*  HCT 39.5 34.0* 33.4*  MCV 81.6 81.5 82.1  PLT 244 189 207    Medications: I have reviewed the patient's current medications. Scheduled Meds: . enoxaparin (LOVENOX) injection  40 mg Subcutaneous Q24H   Continuous Infusions:   PRN Meds:.acetaminophen **OR** acetaminophen, meclizine, methocarbamol **OR** methocarbamol (ROBAXIN)  IV, metoCLOPramide **OR** metoCLOPramide (REGLAN)  injection, ondansetron **OR** ondansetron (ZOFRAN) IV, oxyCODONE Assessment/Plan:  Right medial tibial plateau fracture: External fixation on 12/5, followed by internal fixation on 12/20. PT recommends SNF, but placement could be an issue given payer status. SNF requests sent by CSW. Orthopedic surgery recommends Lovenox for DVT prophylaxis.  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 Patient was informed of status by infectious disease specialist, Dr. Ninetta Lights. Todd Parrish verbalized understanding that this would have to followed closely and likely treated long-term.  Scrotal Hernia: Unable to assess this AM since he had visitors. This is causing him distress since he has to physically move it every time he goes to the bathroom. Large, Non-tender, non-reducible, on exam. Interested in a  Procedure, but this could be an issue given his payer status. This hernia significantly diminishes his quality of life. He will establish with our clinic, we will continue to follow, and we will attempt to find him a surgeon once his leg fracture is addressed.  Lower leg edema: Likely represents venous insufficiency. No complaints of orthopnea, DOE, and no crackles on exam.  DVT Prophylaxis: Lovenox [place]. Will need continuing anticoagulation on discharge  Dispo: Disposition is deferred at this time, awaiting improvement of current medical problems.  Anticipated discharge today.  The patient does not have a current PCP (No primary care provider on file.) and [facility]  The patient does have transportation limitations that hinder transportation to clinic appointments.  .Services Needed at time of discharge: Y = Yes, Blank = No PT: Y  OT: Y  RN:   Equipment:   Other:     LOS: 3 days    Todd ImJeremy Kody Vigil, MD 11/28/2015, 11:04 AM

## 2015-11-29 ENCOUNTER — Telehealth: Payer: Self-pay | Admitting: Infectious Diseases

## 2015-11-29 ENCOUNTER — Non-Acute Institutional Stay (SKILLED_NURSING_FACILITY): Payer: Medicaid Other | Admitting: Adult Health

## 2015-11-29 DIAGNOSIS — T402X5A Adverse effect of other opioids, initial encounter: Secondary | ICD-10-CM | POA: Diagnosis not present

## 2015-11-29 DIAGNOSIS — J302 Other seasonal allergic rhinitis: Secondary | ICD-10-CM

## 2015-11-29 DIAGNOSIS — Z21 Asymptomatic human immunodeficiency virus [HIV] infection status: Secondary | ICD-10-CM | POA: Diagnosis not present

## 2015-11-29 DIAGNOSIS — S82131S Displaced fracture of medial condyle of right tibia, sequela: Secondary | ICD-10-CM

## 2015-11-29 DIAGNOSIS — S82191S Other fracture of upper end of right tibia, sequela: Secondary | ICD-10-CM | POA: Diagnosis not present

## 2015-11-29 DIAGNOSIS — K5903 Drug induced constipation: Secondary | ICD-10-CM | POA: Diagnosis not present

## 2015-11-29 NOTE — Telephone Encounter (Signed)
Called pt to let him know of 2nd positive HIV test.  Will get him seen in clinic within next 2 weeks.

## 2015-11-30 DIAGNOSIS — B20 Human immunodeficiency virus [HIV] disease: Secondary | ICD-10-CM | POA: Insufficient documentation

## 2015-12-03 ENCOUNTER — Encounter: Payer: Self-pay | Admitting: Internal Medicine

## 2015-12-03 ENCOUNTER — Non-Acute Institutional Stay (SKILLED_NURSING_FACILITY): Payer: Medicaid Other | Admitting: Internal Medicine

## 2015-12-03 ENCOUNTER — Encounter: Payer: Self-pay | Admitting: Adult Health

## 2015-12-03 ENCOUNTER — Ambulatory Visit: Payer: Self-pay | Admitting: Internal Medicine

## 2015-12-03 DIAGNOSIS — J302 Other seasonal allergic rhinitis: Secondary | ICD-10-CM | POA: Insufficient documentation

## 2015-12-03 DIAGNOSIS — S82191A Other fracture of upper end of right tibia, initial encounter for closed fracture: Secondary | ICD-10-CM

## 2015-12-03 DIAGNOSIS — T402X5A Adverse effect of other opioids, initial encounter: Secondary | ICD-10-CM | POA: Insufficient documentation

## 2015-12-03 DIAGNOSIS — K4090 Unilateral inguinal hernia, without obstruction or gangrene, not specified as recurrent: Secondary | ICD-10-CM | POA: Diagnosis not present

## 2015-12-03 DIAGNOSIS — K5900 Constipation, unspecified: Secondary | ICD-10-CM

## 2015-12-03 DIAGNOSIS — Z21 Asymptomatic human immunodeficiency virus [HIV] infection status: Secondary | ICD-10-CM | POA: Diagnosis not present

## 2015-12-03 DIAGNOSIS — S82131A Displaced fracture of medial condyle of right tibia, initial encounter for closed fracture: Secondary | ICD-10-CM

## 2015-12-03 DIAGNOSIS — K5903 Drug induced constipation: Secondary | ICD-10-CM | POA: Insufficient documentation
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
## 2015-12-05 NOTE — Telephone Encounter (Signed)
Patient was recently admitted to skilled nursing facility on 12-03-15.  I will call to see if transportation is an issue since he is not able to ambulate at this time.   If this is an issue I will schedule the patient for visit with Dr Ninetta LightsHatcher and intake on same day during a one hour block of time.   Laurell Josephsammy K Jair Lindblad, RN

## 2015-12-06 LAB — HIV-1 RNA ULTRAQUANT REFLEX TO GENTYP+
HIV-1 RNA BY PCR: 12500 {copies}/mL
HIV-1 RNA Quant, Log: 4.097 log10copy/mL

## 2015-12-06 LAB — REFLEX TO GENOSURE(R) MG: HIV GenoSure(R) MG PDF: 0

## 2015-12-06 NOTE — Telephone Encounter (Signed)
Thank you for your dedication to the care of our patients, to our community and your commitment to excellence.   

## 2015-12-09 ENCOUNTER — Encounter (HOSPITAL_COMMUNITY): Payer: Self-pay | Admitting: *Deleted

## 2015-12-09 MED ORDER — ACETAMINOPHEN 500 MG PO TABS
1000.0000 mg | ORAL_TABLET | Freq: Once | ORAL | Status: DC
  Filled 2015-12-09: qty 2

## 2015-12-09 MED ORDER — POTASSIUM CHLORIDE IN NACL 20-0.45 MEQ/L-% IV SOLN
INTRAVENOUS | Status: DC
  Filled 2015-12-09: qty 1000

## 2015-12-09 MED ORDER — CEFAZOLIN SODIUM 10 G IJ SOLR
3.0000 g | INTRAMUSCULAR | Status: AC
  Administered 2015-12-10: 3 g via INTRAVENOUS
  Filled 2015-12-09 (×2): qty 3000

## 2015-12-09 MED ORDER — CHLORHEXIDINE GLUCONATE 4 % EX LIQD: 60.0000 mL | Freq: Once | CUTANEOUS | Status: DC

## 2015-12-09 NOTE — Progress Notes (Signed)
Nurse Logan BoresEvans and pt made aware that it is okay to administer today's dose of Lovenox per MD ( via Tresa EndoKelly, nursing staff). Nurse Logan BoresEvans confirmed receipt of faxed pre-op instructions.

## 2015-12-09 NOTE — Progress Notes (Signed)
Pt denies SOB, chest pain, and being under the care of a cardiologist. Pt completed SDW-pre-op call and pt instructions faxed to nurse Logan BoresEvans, LPN.  LVM with Tresa EndoKelly, Dr. Eulah PontMurphy, nursing staff, to make MD aware that pt refused Lovenox injection today " because I am having surgery tomorrow."

## 2015-12-09 NOTE — Pre-Procedure Instructions (Signed)
    [name]  [facility]      [id number] [name], [name] [place] [id number] Phone: [phone] Fax: [phone]    Your procedure is scheduled on Tuesday, December 10, 2015  [facility] at 5:30 A.M.  Call this number if you have problems the morning of surgery:  [phone]   Remember:  Do not eat food or drink liquids after midnight.  Take these medicines the morning of surgery with A SIP OF WATER : If needed: Antivert for dizziness, Tylenol OR Oxycodone for pain, Zofran OR Reglan for nausea.  Stop taking vitamins, fish oil, and herbal medications. Do not take any NSAIDs ie: Ibuprofen, Advil, Naproxen, BC or Goody Powder; stop now.   Do not wear jewelry, make-up or nail polish.  Do not wear lotions, powders, or perfumes.  You may wear deodorant.  Do not shave 48 hours prior to surgery.  Men may shave face and neck.  Do not bring valuables to the hospital.  [facility] not responsible for any belongings or valuables.  Contacts, dentures or bridgework may not be worn into surgery.  Leave your suitcase in the car.  After surgery it may be brought to your room.  For patients admitted to the hospital, discharge time will be determined by your treatment team.  Patients discharged the day of surgery will not be allowed to drive home. *  Please read over the following fact sheets that you were given.

## 2015-12-10 ENCOUNTER — Encounter (HOSPITAL_COMMUNITY): Payer: Self-pay | Admitting: *Deleted

## 2015-12-10 ENCOUNTER — Inpatient Hospital Stay (HOSPITAL_COMMUNITY)
Admission: RE | Admit: 2015-12-10 | Discharge: 2015-12-12 | DRG: 493 | Disposition: A | Discharge status: Skilled Nursing Facility | Payer: Medicaid Other | Source: Ambulatory Visit | Attending: Orthopedic Surgery | Admitting: Orthopedic Surgery

## 2015-12-10 ENCOUNTER — Encounter (HOSPITAL_COMMUNITY)
Admission: RE | Disposition: A | Discharge status: Skilled Nursing Facility | Payer: Self-pay | Source: Ambulatory Visit | Attending: Orthopedic Surgery

## 2015-12-10 ENCOUNTER — Inpatient Hospital Stay (HOSPITAL_COMMUNITY): Payer: Medicaid Other

## 2015-12-10 ENCOUNTER — Inpatient Hospital Stay (HOSPITAL_COMMUNITY): Payer: Medicaid Other | Admitting: Certified Registered Nurse Anesthetist

## 2015-12-10 ENCOUNTER — Telehealth (HOSPITAL_BASED_OUTPATIENT_CLINIC_OR_DEPARTMENT_OTHER): Payer: Self-pay | Admitting: Emergency Medicine

## 2015-12-10 DIAGNOSIS — S82141A Displaced bicondylar fracture of right tibia, initial encounter for closed fracture: Principal | ICD-10-CM | POA: Diagnosis present

## 2015-12-10 DIAGNOSIS — S83282A Other tear of lateral meniscus, current injury, left knee, initial encounter: Secondary | ICD-10-CM | POA: Diagnosis present

## 2015-12-10 DIAGNOSIS — H8113 Benign paroxysmal vertigo, bilateral: Secondary | ICD-10-CM | POA: Diagnosis not present

## 2015-12-10 DIAGNOSIS — R42 Dizziness and giddiness: Secondary | ICD-10-CM | POA: Diagnosis present

## 2015-12-10 DIAGNOSIS — J45909 Unspecified asthma, uncomplicated: Secondary | ICD-10-CM | POA: Diagnosis present

## 2015-12-10 DIAGNOSIS — S82141D Displaced bicondylar fracture of right tibia, subsequent encounter for closed fracture with routine healing: Secondary | ICD-10-CM | POA: Diagnosis not present

## 2015-12-10 DIAGNOSIS — Z6841 Body Mass Index (BMI) 40.0 and over, adult: Secondary | ICD-10-CM | POA: Diagnosis not present

## 2015-12-10 DIAGNOSIS — K4090 Unilateral inguinal hernia, without obstruction or gangrene, not specified as recurrent: Secondary | ICD-10-CM | POA: Diagnosis not present

## 2015-12-10 DIAGNOSIS — K5903 Drug induced constipation: Secondary | ICD-10-CM | POA: Diagnosis not present

## 2015-12-10 DIAGNOSIS — T402X5A Adverse effect of other opioids, initial encounter: Secondary | ICD-10-CM | POA: Diagnosis not present

## 2015-12-10 DIAGNOSIS — B20 Human immunodeficiency virus [HIV] disease: Secondary | ICD-10-CM | POA: Diagnosis present

## 2015-12-10 DIAGNOSIS — S82141S Displaced bicondylar fracture of right tibia, sequela: Secondary | ICD-10-CM

## 2015-12-10 DIAGNOSIS — K219 Gastro-esophageal reflux disease without esophagitis: Secondary | ICD-10-CM | POA: Diagnosis present

## 2015-12-10 DIAGNOSIS — S82143A Displaced bicondylar fracture of unspecified tibia, initial encounter for closed fracture: Secondary | ICD-10-CM | POA: Diagnosis present

## 2015-12-10 DIAGNOSIS — E66813 Obesity, class 3: Secondary | ICD-10-CM | POA: Diagnosis present

## 2015-12-10 DIAGNOSIS — Z419 Encounter for procedure for purposes other than remedying health state, unspecified: Secondary | ICD-10-CM

## 2015-12-10 DIAGNOSIS — Z21 Asymptomatic human immunodeficiency virus [HIV] infection status: Secondary | ICD-10-CM | POA: Diagnosis not present

## 2015-12-10 DIAGNOSIS — G629 Polyneuropathy, unspecified: Secondary | ICD-10-CM | POA: Diagnosis not present

## 2015-12-10 HISTORY — PX: ORIF TIBIA PLATEAU: SHX2132

## 2015-12-10 HISTORY — PX: KNEE ARTHROTOMY: SHX5881

## 2015-12-10 HISTORY — PX: ORIF PROXIMAL TIBIAL PLATEAU FRACTURE: SUR953

## 2015-12-10 HISTORY — DX: Gastro-esophageal reflux disease without esophagitis: K21.9

## 2015-12-10 HISTORY — DX: Unilateral inguinal hernia, without obstruction or gangrene, not specified as recurrent: K40.90

## 2015-12-10 HISTORY — PX: APPLICATION OF WOUND VAC: SHX5189

## 2015-12-10 LAB — CBC
HCT: 31.7 % — ABNORMAL LOW (ref 39.0–52.0)
HEMOGLOBIN: 10 g/dL — AB (ref 13.0–17.0)
MCH: 25.6 pg — AB (ref 26.0–34.0)
MCHC: 31.5 g/dL (ref 30.0–36.0)
MCV: 81.1 fL (ref 78.0–100.0)
PLATELETS: 255 10*3/uL (ref 150–400)
RBC: 3.91 MIL/uL — AB (ref 4.22–5.81)
RDW: 13.6 % (ref 11.5–15.5)
WBC: 13.9 10*3/uL — AB (ref 4.0–10.5)

## 2015-12-10 LAB — CREATININE, SERUM
Creatinine, Ser: 0.75 mg/dL (ref 0.61–1.24)
GFR calc non Af Amer: >60 mL/min (ref >60)

## 2015-12-10 LAB — PREPARE RBC (CROSSMATCH)

## 2015-12-10 LAB — ABO/RH: ABO/RH(D): O POS

## 2015-12-10 SURGERY — OPEN REDUCTION INTERNAL FIXATION (ORIF) TIBIAL PLATEAU
Anesthesia: General | Site: Leg Lower | Laterality: Right

## 2015-12-10 MED ORDER — ALBUTEROL SULFATE HFA 108 (90 BASE) MCG/ACT IN AERS
INHALATION_SPRAY | RESPIRATORY_TRACT | Status: AC
  Filled 2015-12-10: qty 6.7

## 2015-12-10 MED ORDER — SUGAMMADEX SODIUM 200 MG/2ML IV SOLN
INTRAVENOUS | Status: DC | PRN
  Administered 2015-12-10: 200 mg via INTRAVENOUS

## 2015-12-10 MED ORDER — LACTATED RINGERS IV SOLN
INTRAVENOUS | Status: DC | PRN
  Administered 2015-12-10 (×4): via INTRAVENOUS

## 2015-12-10 MED ORDER — LIDOCAINE HCL (CARDIAC) 20 MG/ML IV SOLN
INTRAVENOUS | Status: DC | PRN
  Administered 2015-12-10: 100 mg via INTRAVENOUS

## 2015-12-10 MED ORDER — ENOXAPARIN SODIUM 40 MG/0.4ML ~~LOC~~ SOLN: 40.0000 mg | SUBCUTANEOUS | Status: DC

## 2015-12-10 MED ORDER — ACETAMINOPHEN 650 MG RE SUPP: 650.0000 mg | Freq: Four times a day (QID) | RECTAL | Status: DC | PRN

## 2015-12-10 MED ORDER — FENTANYL CITRATE (PF) 250 MCG/5ML IJ SOLN
INTRAMUSCULAR | Status: AC
  Filled 2015-12-10: qty 5

## 2015-12-10 MED ORDER — SODIUM CHLORIDE 0.9 % IV SOLN
Freq: Once | INTRAVENOUS | Status: AC
  Administered 2015-12-10: 10:00:00 via INTRAVENOUS

## 2015-12-10 MED ORDER — SUCCINYLCHOLINE CHLORIDE 20 MG/ML IJ SOLN
INTRAMUSCULAR | Status: AC
  Filled 2015-12-10: qty 1

## 2015-12-10 MED ORDER — OXYCODONE HCL 5 MG PO TABS
5.0000 mg | ORAL_TABLET | ORAL | Status: DC | PRN
Start: 2015-12-10 — End: 2015-12-12
  Administered 2015-12-10 – 2015-12-12 (×14): 10 mg via ORAL
  Filled 2015-12-10 (×14): qty 2

## 2015-12-10 MED ORDER — ONDANSETRON HCL 4 MG PO TABS: 4.0000 mg | ORAL_TABLET | Freq: Four times a day (QID) | ORAL | Status: DC | PRN

## 2015-12-10 MED ORDER — ALBUTEROL SULFATE HFA 108 (90 BASE) MCG/ACT IN AERS
INHALATION_SPRAY | RESPIRATORY_TRACT | Status: DC | PRN
  Administered 2015-12-10: 8 via RESPIRATORY_TRACT

## 2015-12-10 MED ORDER — METHOCARBAMOL 500 MG PO TABS: 500.0000 mg | ORAL_TABLET | Freq: Four times a day (QID) | ORAL | Status: DC

## 2015-12-10 MED ORDER — PROPOFOL 10 MG/ML IV BOLUS
INTRAVENOUS | Status: DC | PRN
  Administered 2015-12-10: 150 mg via INTRAVENOUS

## 2015-12-10 MED ORDER — MEPERIDINE HCL 25 MG/ML IJ SOLN: 6.2500 mg | INTRAMUSCULAR | Status: DC | PRN

## 2015-12-10 MED ORDER — ALBUMIN HUMAN 5 % IV SOLN
INTRAVENOUS | Status: DC | PRN
  Administered 2015-12-10: 10:00:00 via INTRAVENOUS

## 2015-12-10 MED ORDER — ACETAMINOPHEN 325 MG PO TABS
650.0000 mg | ORAL_TABLET | Freq: Four times a day (QID) | ORAL | Status: DC | PRN
Start: 2015-12-10 — End: 2015-12-12

## 2015-12-10 MED ORDER — 0.9 % SODIUM CHLORIDE (POUR BTL) OPTIME
TOPICAL | Status: DC | PRN
  Administered 2015-12-10: 1000 mL

## 2015-12-10 MED ORDER — ONDANSETRON HCL 4 MG/2ML IJ SOLN
INTRAMUSCULAR | Status: DC | PRN
  Administered 2015-12-10: 4 mg via INTRAVENOUS

## 2015-12-10 MED ORDER — MIDAZOLAM HCL 5 MG/5ML IJ SOLN
INTRAMUSCULAR | Status: DC | PRN
  Administered 2015-12-10: 2 mg via INTRAVENOUS

## 2015-12-10 MED ORDER — ONDANSETRON HCL 4 MG/2ML IJ SOLN: 4.0000 mg | Freq: Once | INTRAMUSCULAR | Status: DC | PRN

## 2015-12-10 MED ORDER — DOCUSATE SODIUM 100 MG PO CAPS
100.0000 mg | ORAL_CAPSULE | Freq: Two times a day (BID) | ORAL | Status: DC
  Administered 2015-12-10 – 2015-12-12 (×5): 100 mg via ORAL
  Filled 2015-12-10 (×5): qty 1

## 2015-12-10 MED ORDER — PROPOFOL 10 MG/ML IV BOLUS
INTRAVENOUS | Status: AC
  Filled 2015-12-10: qty 20

## 2015-12-10 MED ORDER — METOCLOPRAMIDE HCL 5 MG PO TABS: 5.0000 mg | ORAL_TABLET | Freq: Three times a day (TID) | ORAL | Status: DC | PRN

## 2015-12-10 MED ORDER — EPHEDRINE SULFATE 50 MG/ML IJ SOLN
INTRAMUSCULAR | Status: AC
  Filled 2015-12-10: qty 1

## 2015-12-10 MED ORDER — HYDROMORPHONE HCL 1 MG/ML IJ SOLN
INTRAMUSCULAR | Status: AC
  Administered 2015-12-10: 0.5 mg via INTRAVENOUS
  Filled 2015-12-10: qty 1

## 2015-12-10 MED ORDER — ROCURONIUM BROMIDE 50 MG/5ML IV SOLN
INTRAVENOUS | Status: AC
  Filled 2015-12-10: qty 1

## 2015-12-10 MED ORDER — ONDANSETRON HCL 4 MG/2ML IJ SOLN: 4.0000 mg | Freq: Four times a day (QID) | INTRAMUSCULAR | Status: DC | PRN

## 2015-12-10 MED ORDER — PHENYLEPHRINE 40 MCG/ML (10ML) SYRINGE FOR IV PUSH (FOR BLOOD PRESSURE SUPPORT)
PREFILLED_SYRINGE | INTRAVENOUS | Status: AC
  Filled 2015-12-10: qty 10

## 2015-12-10 MED ORDER — LIDOCAINE HCL (CARDIAC) 20 MG/ML IV SOLN
INTRAVENOUS | Status: AC
  Filled 2015-12-10: qty 5

## 2015-12-10 MED ORDER — ONDANSETRON HCL 4 MG PO TABS: 4.0000 mg | ORAL_TABLET | Freq: Three times a day (TID) | ORAL | Status: DC | PRN

## 2015-12-10 MED ORDER — GABAPENTIN 300 MG PO CAPS
300.0000 mg | ORAL_CAPSULE | Freq: Every day | ORAL | Status: DC
  Administered 2015-12-10 – 2015-12-11 (×2): 300 mg via ORAL
  Filled 2015-12-10 (×2): qty 1

## 2015-12-10 MED ORDER — POLYETHYLENE GLYCOL 3350 17 G PO PACK
17.0000 g | PACK | Freq: Every day | ORAL | Status: DC
  Administered 2015-12-10 – 2015-12-12 (×2): 17 g via ORAL
  Filled 2015-12-10 (×3): qty 1

## 2015-12-10 MED ORDER — SUGAMMADEX SODIUM 200 MG/2ML IV SOLN
INTRAVENOUS | Status: AC
  Filled 2015-12-10: qty 2

## 2015-12-10 MED ORDER — METOCLOPRAMIDE HCL 5 MG/ML IJ SOLN: 5.0000 mg | Freq: Three times a day (TID) | INTRAMUSCULAR | Status: DC | PRN

## 2015-12-10 MED ORDER — PNEUMOCOCCAL VAC POLYVALENT 25 MCG/0.5ML IJ INJ
0.5000 mL | INJECTION | INTRAMUSCULAR | Status: DC
  Filled 2015-12-10: qty 0.5

## 2015-12-10 MED ORDER — HYDROMORPHONE HCL 1 MG/ML IJ SOLN
1.0000 mg | INTRAMUSCULAR | Status: DC | PRN
  Administered 2015-12-10 – 2015-12-12 (×7): 1 mg via INTRAVENOUS
  Filled 2015-12-10 (×7): qty 1

## 2015-12-10 MED ORDER — DIAZEPAM 5 MG PO TABS
5.0000 mg | ORAL_TABLET | Freq: Four times a day (QID) | ORAL | Status: DC | PRN
  Administered 2015-12-10 – 2015-12-12 (×7): 5 mg via ORAL
  Filled 2015-12-10 (×7): qty 1

## 2015-12-10 MED ORDER — DEXAMETHASONE SODIUM PHOSPHATE 10 MG/ML IJ SOLN
INTRAMUSCULAR | Status: DC | PRN
  Administered 2015-12-10: 10 mg via INTRAVENOUS

## 2015-12-10 MED ORDER — HYDROMORPHONE HCL 1 MG/ML IJ SOLN
0.2500 mg | INTRAMUSCULAR | Status: DC | PRN
  Administered 2015-12-10 (×4): 0.5 mg via INTRAVENOUS

## 2015-12-10 MED ORDER — DEXAMETHASONE SODIUM PHOSPHATE 10 MG/ML IJ SOLN
INTRAMUSCULAR | Status: AC
  Filled 2015-12-10: qty 1

## 2015-12-10 MED ORDER — CEFAZOLIN SODIUM-DEXTROSE 2-3 GM-% IV SOLR
2.0000 g | Freq: Four times a day (QID) | INTRAVENOUS | Status: AC
  Administered 2015-12-10 – 2015-12-11 (×3): 2 g via INTRAVENOUS
  Filled 2015-12-10 (×5): qty 50

## 2015-12-10 MED ORDER — POTASSIUM CHLORIDE IN NACL 20-0.45 MEQ/L-% IV SOLN
INTRAVENOUS | Status: DC
  Administered 2015-12-10 – 2015-12-11 (×2): via INTRAVENOUS
  Filled 2015-12-10 (×7): qty 1000

## 2015-12-10 MED ORDER — FENTANYL CITRATE (PF) 100 MCG/2ML IJ SOLN
INTRAMUSCULAR | Status: DC | PRN
  Administered 2015-12-10 (×4): 50 ug via INTRAVENOUS
  Administered 2015-12-10: 100 ug via INTRAVENOUS
  Administered 2015-12-10 (×2): 50 ug via INTRAVENOUS

## 2015-12-10 MED ORDER — MECLIZINE HCL 12.5 MG PO TABS
12.5000 mg | ORAL_TABLET | Freq: Three times a day (TID) | ORAL | Status: DC | PRN
  Filled 2015-12-10: qty 1

## 2015-12-10 MED ORDER — ROCURONIUM BROMIDE 100 MG/10ML IV SOLN
INTRAVENOUS | Status: DC | PRN
  Administered 2015-12-10: 50 mg via INTRAVENOUS
  Administered 2015-12-10: 10 mg via INTRAVENOUS

## 2015-12-10 MED ORDER — OXYCODONE HCL 5 MG PO TABS: 15.0000 mg | ORAL_TABLET | ORAL | Status: DC | PRN

## 2015-12-10 MED ORDER — MIDAZOLAM HCL 2 MG/2ML IJ SOLN
INTRAMUSCULAR | Status: AC
  Filled 2015-12-10: qty 2

## 2015-12-10 MED ORDER — ONDANSETRON HCL 4 MG/2ML IJ SOLN
INTRAMUSCULAR | Status: AC
  Filled 2015-12-10: qty 2

## 2015-12-10 MED ORDER — DOCUSATE SODIUM 100 MG PO CAPS: 100.0000 mg | ORAL_CAPSULE | Freq: Two times a day (BID) | ORAL | Status: DC

## 2015-12-10 SURGICAL SUPPLY — 98 items
BANDAGE ELASTIC 4 VELCRO ST LF (GAUZE/BANDAGES/DRESSINGS) IMPLANT
BANDAGE ELASTIC 6 VELCRO ST LF (GAUZE/BANDAGES/DRESSINGS) IMPLANT
BANDAGE ESMARK 6X9 LF (GAUZE/BANDAGES/DRESSINGS) ×1 IMPLANT
BIT DRILL 2.5 (BIT) ×1
BIT DRILL LOCK 3.1X238 (BIT) ×2 IMPLANT
BIT DRILL SHRT 216X2.5XNS LF (BIT) ×1 IMPLANT
BIT DRL SHRT 216X2.5XNS LF (BIT) ×1
BLADE SURG 10 STRL SS (BLADE) IMPLANT
BLADE SURG 15 STRL LF DISP TIS (BLADE) IMPLANT
BLADE SURG 15 STRL SS (BLADE)
BNDG COHESIVE 4X5 TAN STRL (GAUZE/BANDAGES/DRESSINGS) IMPLANT
BNDG COHESIVE 6X5 TAN STRL LF (GAUZE/BANDAGES/DRESSINGS) ×2 IMPLANT
BNDG ELASTIC 6X10 VLCR STRL LF (GAUZE/BANDAGES/DRESSINGS) ×2 IMPLANT
BNDG ESMARK 6X9 LF (GAUZE/BANDAGES/DRESSINGS) ×2
BNDG GAUZE ELAST 4 BULKY (GAUZE/BANDAGES/DRESSINGS) ×2 IMPLANT
CANISTER WOUND CARE 500ML ATS (WOUND CARE) ×2 IMPLANT
COVER MAYO STAND STRL (DRAPES) IMPLANT
COVER SURGICAL LIGHT HANDLE (MISCELLANEOUS) ×2 IMPLANT
CUFF TOURNIQUET SINGLE 34IN LL (TOURNIQUET CUFF) IMPLANT
CUFF TOURNIQUET SINGLE 44IN (TOURNIQUET CUFF) ×2 IMPLANT
DRAPE C-ARM 42X72 X-RAY (DRAPES) ×2 IMPLANT
DRAPE C-ARMOR (DRAPES) ×2 IMPLANT
DRAPE IMP U-DRAPE 54X76 (DRAPES) ×4 IMPLANT
DRAPE INCISE IOBAN 66X45 STRL (DRAPES) ×2 IMPLANT
DRAPE ORTHO SPLIT 77X108 STRL (DRAPES) ×2
DRAPE SURG ORHT 6 SPLT 77X108 (DRAPES) ×2 IMPLANT
DRAPE U-SHAPE 47X51 STRL (DRAPES) ×2 IMPLANT
DRSG ADAPTIC 3X8 NADH LF (GAUZE/BANDAGES/DRESSINGS) ×2 IMPLANT
DRSG MEPILEX BORDER 4X4 (GAUZE/BANDAGES/DRESSINGS) ×2 IMPLANT
DRSG PAD ABDOMINAL 8X10 ST (GAUZE/BANDAGES/DRESSINGS) IMPLANT
DRSG VAC ATS MED SENSATRAC (GAUZE/BANDAGES/DRESSINGS) ×2 IMPLANT
ELECT REM PT RETURN 9FT ADLT (ELECTROSURGICAL) ×2
ELECTRODE REM PT RTRN 9FT ADLT (ELECTROSURGICAL) ×1 IMPLANT
GAUZE SPONGE 4X4 12PLY STRL (GAUZE/BANDAGES/DRESSINGS) ×2 IMPLANT
GLOVE BIO SURGEON STRL SZ7 (GLOVE) ×2 IMPLANT
GLOVE BIO SURGEON STRL SZ7.5 (GLOVE) ×2 IMPLANT
GLOVE BIOGEL PI IND STRL 7.0 (GLOVE) ×1 IMPLANT
GLOVE BIOGEL PI IND STRL 7.5 (GLOVE) ×1 IMPLANT
GLOVE BIOGEL PI IND STRL 8 (GLOVE) ×2 IMPLANT
GLOVE BIOGEL PI INDICATOR 7.0 (GLOVE) ×1
GLOVE BIOGEL PI INDICATOR 7.5 (GLOVE) ×1
GLOVE BIOGEL PI INDICATOR 8 (GLOVE) ×2
GLOVE SURG SS PI 6.5 STRL IVOR (GLOVE) ×2 IMPLANT
GLOVE SURG SS PI 8.0 STRL IVOR (GLOVE) ×4 IMPLANT
GOWN STRL REUS W/ TWL LRG LVL3 (GOWN DISPOSABLE) ×2 IMPLANT
GOWN STRL REUS W/TWL LRG LVL3 (GOWN DISPOSABLE) ×2
IMMOBILIZER KNEE 22  40 CIR (ORTHOPEDIC SUPPLIES) ×1
IMMOBILIZER KNEE 22 40 CIR (ORTHOPEDIC SUPPLIES) ×1 IMPLANT
IMMOBILIZER KNEE 22 UNIV (SOFTGOODS) IMPLANT
K-WIRE 2.0 (WIRE) ×2
K-WIRE FX234X2XDRL TIP NS (WIRE) ×2
KIT BASIN OR (CUSTOM PROCEDURE TRAY) ×2 IMPLANT
KIT ROOM TURNOVER OR (KITS) ×2 IMPLANT
KWIRE FX234X2XDRL TIP NS (WIRE) ×2 IMPLANT
LOCKING DRILL BIT ×2 IMPLANT
MANIFOLD NEPTUNE II (INSTRUMENTS) ×4 IMPLANT
NDL SUT 6 .5 CRC .975X.05 MAYO (NEEDLE) ×1 IMPLANT
NEEDLE MAYO TAPER (NEEDLE) ×1
NS IRRIG 1000ML POUR BTL (IV SOLUTION) ×2 IMPLANT
PACK ORTHO EXTREMITY (CUSTOM PROCEDURE TRAY) ×2 IMPLANT
PAD ARMBOARD 7.5X6 YLW CONV (MISCELLANEOUS) ×4 IMPLANT
PLATE LAT TIB RT PROX 6H (Plate) ×2 IMPLANT
SCREW 3.5X80MM (Screw) ×2 IMPLANT
SCREW 4.0X60MM (Screw) ×4 IMPLANT
SCREW BONE 3.5X75MM (Screw) ×2 IMPLANT
SCREW BONE 38X3.5MM (Screw) ×2 IMPLANT
SCREW BONE 40X3.5MM (Screw) ×2 IMPLANT
SCREW CORT 30X3.5XST LCK NS (Screw) ×3 IMPLANT
SCREW CORT 32X3.5XST LCK NS (Screw) ×1 IMPLANT
SCREW CORT 34X3.5XST LCK NS (Screw) ×1 IMPLANT
SCREW CORTICAL 3.5X30MM (Screw) ×3 IMPLANT
SCREW CORTICAL 3.5X32MM (Screw) ×1 IMPLANT
SCREW CORTICAL 3.5X34MM (Screw) ×1 IMPLANT
SCREW LOCKING 4.0X70MM (Screw) ×4 IMPLANT
SCREW NON LOCKING 3.5X70MM (Screw) ×2 IMPLANT
SOLUTION BETADINE 4OZ (MISCELLANEOUS) ×2 IMPLANT
SPONGE LAP 18X18 X RAY DECT (DISPOSABLE) ×4 IMPLANT
SPRAY BETADINE AEROSOL XXX (MISCELLANEOUS) ×2 IMPLANT
STOCKINETTE IMPERVIOUS LG (DRAPES) ×2 IMPLANT
SUCTION FRAZIER TIP 10 FR DISP (SUCTIONS) ×4 IMPLANT
SUT ETHILON 2 0 PSLX (SUTURE) ×2 IMPLANT
SUT ETHILON 3 0 FSL (SUTURE) ×2 IMPLANT
SUT FIBERWIRE #2 38 T-5 BLUE (SUTURE) ×6
SUT FIBERWIRE 2-0 18 17.9 3/8 (SUTURE)
SUT MNCRL AB 4-0 PS2 18 (SUTURE) ×2 IMPLANT
SUT MON AB 2-0 CT1 36 (SUTURE) ×4 IMPLANT
SUT VIC AB 0 CT1 27 (SUTURE) ×1
SUT VIC AB 0 CT1 27XBRD ANBCTR (SUTURE) ×1 IMPLANT
SUTURE FIBERWR #2 38 T-5 BLUE (SUTURE) ×3 IMPLANT
SUTURE FIBERWR 2-0 18 17.9 3/8 (SUTURE) IMPLANT
TOWEL OR 17X24 6PK STRL BLUE (TOWEL DISPOSABLE) ×2 IMPLANT
TOWEL OR 17X26 10 PK STRL BLUE (TOWEL DISPOSABLE) ×4 IMPLANT
TOWEL OR NON WOVEN STRL DISP B (DISPOSABLE) ×2 IMPLANT
TRAY CATH 16FR W/PLASTIC CATH (SET/KITS/TRAYS/PACK) ×2 IMPLANT
TRAY FOLEY CATH 14FR (SET/KITS/TRAYS/PACK) IMPLANT
TUBE CONNECTING 12X1/4 (SUCTIONS) ×2 IMPLANT
WATER STERILE IRR 1000ML POUR (IV SOLUTION) IMPLANT
YANKAUER SUCT BULB TIP NO VENT (SUCTIONS) ×2 IMPLANT

## 2015-12-10 NOTE — Discharge Instructions (Signed)
Keep dressings clean and dry till follow up  Non-weight bearing in the knee immobilizer at all times  Lovenox daily for DVT prophylaxis

## 2015-12-10 NOTE — Transfer of Care (Signed)
Immediate Anesthesia Transfer of Care Note  Patient: [employer] Jermaine Dorner  Procedure(s) Performed: Procedure(s): OPEN REDUCTION INTERNAL FIXATION (ORIF) RIGHT BICONDYLAR PLATEAU     (Right) RIGHT KNEE ARTHROTOMY FASCIOTOMY. (Right) APPLICATION OF WOUND VAC RIGHT LOWER LEG (Right)  Patient Location: PACU  Anesthesia Type:General  Level of Consciousness: awake  Airway & Oxygen Therapy: Patient Spontanous Breathing and Patient connected to face mask oxygen  Post-op Assessment: Report given to RN and Post -op Vital signs reviewed and stable  Post vital signs: stable  Last Vitals:  Filed Vitals:   12/10/15 0555  BP: 173/72  Pulse: 84  Temp: 37.1 C  Resp: 20    Complications: No apparent anesthesia complications

## 2015-12-10 NOTE — Anesthesia Procedure Notes (Signed)
Procedure Name: Intubation Date/Time: 12/10/2015 7:44 AM Performed by: Little IshikawaMERCER, Masha Orbach L Pre-anesthesia Checklist: Patient identified, Timeout performed, Emergency Drugs available, Suction available and Patient being monitored Patient Re-evaluated:Patient Re-evaluated prior to inductionOxygen Delivery Method: Circle system utilized Preoxygenation: Pre-oxygenation with 100% oxygen Intubation Type: IV induction Ventilation: Mask ventilation without difficulty Laryngoscope Size: Mac and 4 Grade View: Grade II Tube type: Oral Tube size: 7.5 mm Number of attempts: 1 Airway Equipment and Method: Stylet (positioned with ramped shoulders on blankets) Placement Confirmation: ETT inserted through vocal cords under direct vision,  positive ETCO2 and breath sounds checked- equal and bilateral Secured at: 22 cm Tube secured with: Tape Dental Injury: Teeth and Oropharynx as per pre-operative assessment

## 2015-12-10 NOTE — Op Note (Signed)
12/10/2015  9:58 AM  PATIENT:  Todd Parrish    PRE-OPERATIVE DIAGNOSIS:  RIGHT TIBIAL FIBIULA FRACTURE   POST-OPERATIVE DIAGNOSIS:  Same  PROCEDURE:  OPEN REDUCTION INTERNAL FIXATION (ORIF) RIGHT BICONDYLAR PLATEAU     ARTHROTOMY,FASCIOTOMY., RIGHT KNEE ARTHROTOMY FASCIOTOMY.  SURGEON:  MURPHY, Jewel BaizeIMOTHY D, MD  ASSISTANT: Janalee DaneBrittney Kelly, PA-C, She was present and scrubbed throughout the case, critical for completion in a timely fashion, and for retraction, instrumentation, and closure.   ANESTHESIA:   gen  PREOPERATIVE INDICATIONS:  Todd Parrish is a  34 y.o. male with a diagnosis of RIGHT TIBIAL FIBIULA FRACTURE  who failed conservative measures and elected for surgical management.    The risks benefits and alternatives were discussed with the patient preoperatively including but not limited to the risks of infection, bleeding, nerve injury, cardiopulmonary complications, the need for revision surgery, among others, and the patient was willing to proceed.  OPERATIVE IMPLANTS: ant lat locking plate  OPERATIVE FINDINGS: lateral meniscal tear. Significant lateral joint comminution.   BLOOD LOSS: 1L  COMPLICATIONS: none  TOURNIQUET TIME: 90min  OPERATIVE PROCEDURE:  Patient was identified in the preoperative holding area and site was marked by me He was transported to the operating theater and placed on the table in supine position taking care to pad all bony prominences. After a preincinduction time out anesthesia was induced. The right extremity was prepped and draped in normal sterile fashion and a pre-incision timeout was performed. He received ancef for preoperative antibiotics.   His surgery was made significantly more difficult due to his obesity.  Tourniquet was inflated to 300 mmHg.  I made an anterior lateral approach to his knee. Identified his IT band and split this. I incised his anterior compartment off the crest of the tibia and elevated it.  I  used a Cobb to elevate submuscular within the anterior compartment for placement of the plate.  I then made a sub-meniscal arthrotomy identified a circumferential bucketed meniscus tear I reduced this and placed two 0 FiberWire stitches in the meniscus.   Open booked his fracture identified the compressed portion of his joint there was significantly comminuted portions that had to be removed and were stripped of cartilage.  With the elevation of the large depressed portion I was able to maintain a relatively stable joint line I then reduced the split portion of the fracture back under this and this gave him good varus valgus stability.  Next I pinned his plate into place I placed 2 lag screws initially to close down the split on the medial side as well as a lag screw in the shaft.  Next I placed multiple locking screws underneath the lateral joint and medial joint to help raft these.  I placed 4 more lag screws across the shaft and was happy with the purchase of each of these. I then repaired his meniscus down to the plate and capsule.  I left his anterior compartment open for room for swelling after performing this fasciotomy there.  I closed his capsule IT band and reapproximated some of his anterior musculature.  I then thoroughly irrigated his incision and closed in layers following with simple stitches and a wound VAC. Sterile dressings were applied he received a block in the operating room and an in and out catheter.  He received a unit of blood as well. His taken the PACU in stable condition.  POST OPERATIVE PLAN: Lovenox, and scd's for dvt px.     This note was  generated using a template and dragon dictation system. In light of that, I have reviewed the note and all aspects of it are applicable to this case. Any dictation errors are due to the computerized dictation system.

## 2015-12-10 NOTE — Anesthesia Postprocedure Evaluation (Signed)
Anesthesia Post Note  Patient: Todd Parrish  Procedure(s) Performed: Procedure(s) (LRB): OPEN REDUCTION INTERNAL FIXATION (ORIF) RIGHT BICONDYLAR PLATEAU     (Right) RIGHT KNEE ARTHROTOMY FASCIOTOMY. (Right) APPLICATION OF WOUND VAC RIGHT LOWER LEG (Right)  Patient location during evaluation: PACU Anesthesia Type: General Level of consciousness: awake and alert Pain management: pain level controlled Vital Signs Assessment: post-procedure vital signs reviewed and stable Respiratory status: spontaneous breathing, nonlabored ventilation, respiratory function stable and patient connected to nasal cannula oxygen Cardiovascular status: blood pressure returned to baseline and stable Postop Assessment: no signs of nausea or vomiting Anesthetic complications: no    Last Vitals:  Filed Vitals:   12/10/15 1135 12/10/15 1150  BP: 149/78 148/73  Pulse: 106 110  Temp:    Resp: 16 17    Last Pain:  Filed Vitals:   12/10/15 1154  PainSc: Asleep        RLE Motor Response: Purposeful movement;Responds to commands (12/10/15 1150)        Carrissa Taitano DAVID

## 2015-12-10 NOTE — Progress Notes (Signed)
Utilization review completed. Bernita Beckstrom, RN, BSN. 

## 2015-12-10 NOTE — Anesthesia Preprocedure Evaluation (Addendum)
Anesthesia Evaluation  Patient identified by MRN, date of birth, ID band Patient awake    Reviewed: Allergy & Precautions, NPO status , Patient's Chart, lab work & pertinent test results  History of Anesthesia Complications Negative for: history of anesthetic complications  Airway Mallampati: II  TM Distance: >3 FB Neck ROM: Full    Dental  (+) Teeth Intact, Dental Advisory Given   Pulmonary asthma ,  Childhood Asthma. Denies issues as an adult   Pulmonary exam normal        Cardiovascular Normal cardiovascular exam     Neuro/Psych vertigo    GI/Hepatic GERD  Medicated and Controlled,  Endo/Other    Renal/GU      Musculoskeletal   Abdominal   Peds  Hematology   Anesthesia Other Findings   Reproductive/Obstetrics                            Anesthesia Physical Anesthesia Plan  ASA: II  Anesthesia Plan: General   Post-op Pain Management:    Induction: Intravenous  Airway Management Planned: Oral ETT  Additional Equipment:   Intra-op Plan:   Post-operative Plan: Extubation in OR  Informed Consent: I have reviewed the patients History and Physical, chart, labs and discussed the procedure including the risks, benefits and alternatives for the proposed anesthesia with the patient or authorized representative who has indicated his/her understanding and acceptance.     Plan Discussed with: CRNA and Surgeon  Anesthesia Plan Comments:        Anesthesia Quick Evaluation

## 2015-12-10 NOTE — Interval H&P Note (Signed)
History and Physical Interval Note:  12/10/2015 7:23 AM  Todd Parrish  has presented today for surgery, with the diagnosis of RIGHT TIBIAL FIBIULA FRACTURE   The various methods of treatment have been discussed with the patient and family. After consideration of risks, benefits and other options for treatment, the patient has consented to  Procedure(s): OPEN REDUCTION INTERNAL FIXATION (ORIF) RIGHT BICONDYLAR PLATEAU     ARTHROTOMY,FASCIOTOMY. (Right) RIGHT KNEE ARTHROTOMY FASCIOTOMY. (Right) as a surgical intervention .  The patient's history has been reviewed, patient examined, no change in status, stable for surgery.  I have reviewed the patient's chart and labs.  Questions were answered to the patient's satisfaction.     Cortland Crehan D

## 2015-12-10 NOTE — H&P (View-Only) (Signed)
     Subjective:  POD#2 Closed reduction and external fixation of R tibial plateau fx. Patient reports pain as moderate.  Resting comfortably in bed this morning.  Up to the chair multiple times with PT/OT yesterday.  Appropriate from ortho standpoint for discharge to SNF once bed is available. Discontinuing IV pain medication since will not be able to transfer to facility on this.  Will keep the oxycodone and muscle relaxant on board for pain control.  The importance of elevation was discussed to help with swelling.    Objective:   VITALS:   Filed Vitals:   11/26/15 0702 11/26/15 1411 11/26/15 2004 11/27/15 0401  BP: 133/65 161/77 148/71 142/62  Pulse: 82 81 52 80  Temp: 98.8 F (37.1 C) 97.7 F (36.5 C) 98.2 F (36.8 C) 98.1 F (36.7 C)  TempSrc: Oral Oral Oral Oral  Resp: 18 18 16 18  SpO2: 97% 100% 94% 97%    Neurologically intact ABD soft Neurovascular intact Sensation intact distally Intact pulses distally Dorsiflexion/Plantar flexion intact Incision: dressing C/D/I External fixation to the R leg  Lab Results  Component Value Date   WBC 8.1 11/27/2015   HGB 10.0* 11/27/2015   HCT 33.4* 11/27/2015   MCV 82.1 11/27/2015   PLT 207 11/27/2015   BMET    Component Value Date/Time   NA 134* 11/26/2015 0705   K 4.7 11/26/2015 0705   CL 101 11/26/2015 0705   CO2 25 11/26/2015 0705   GLUCOSE 117* 11/26/2015 0705   BUN 8 11/26/2015 0705   CREATININE 0.82 11/26/2015 0705   CALCIUM 8.4* 11/26/2015 0705   GFRNONAA >60 11/26/2015 0705   GFRAA >60 11/26/2015 0705     Assessment/Plan: 2 Days Post-Op   Active Problems:   Right medial tibial plateau fracture   Tibial plateau fracture   Up with therapy NWB in the RLE Lovenox for DVT prophylaxis Encouraged elevation to help with swelling of the leg.   Todd Parrish 11/27/2015, 6:28 AM Cell [phone]    

## 2015-12-11 ENCOUNTER — Encounter (HOSPITAL_COMMUNITY): Payer: Self-pay | Admitting: Orthopedic Surgery

## 2015-12-11 ENCOUNTER — Ambulatory Visit: Payer: Self-pay | Admitting: Internal Medicine

## 2015-12-11 LAB — TYPE AND SCREEN
ABO/RH(D): O POS
ANTIBODY SCREEN: NEGATIVE
Unit division: 0
Unit division: 0

## 2015-12-11 LAB — POCT I-STAT EG7
ACID-BASE EXCESS: 2 mmol/L (ref 0.0–2.0)
BICARBONATE: 25.6 meq/L — AB (ref 20.0–24.0)
CALCIUM ION: 1.13 mmol/L (ref 1.12–1.23)
HCT: 29 % — ABNORMAL LOW (ref 39.0–52.0)
Hemoglobin: 9.9 g/dL — ABNORMAL LOW (ref 13.0–17.0)
O2 SAT: 99 %
PH VEN: 7.495 — AB (ref 7.250–7.300)
Potassium: 4.1 mmol/L (ref 3.5–5.1)
SODIUM: 132 mmol/L — AB (ref 135–145)
TCO2: 27 mmol/L (ref 0–100)
pCO2, Ven: 33 mmHg — ABNORMAL LOW (ref 45.0–50.0)
pO2, Ven: 126 mmHg — ABNORMAL HIGH (ref 30.0–45.0)

## 2015-12-11 LAB — HEMOGLOBIN AND HEMATOCRIT, BLOOD
HEMATOCRIT: 27.2 % — AB (ref 39.0–52.0)
Hemoglobin: 8.8 g/dL — ABNORMAL LOW (ref 13.0–17.0)

## 2015-12-11 MED ORDER — ENOXAPARIN SODIUM 40 MG/0.4ML ~~LOC~~ SOLN: 80.0000 mg | SUBCUTANEOUS | Status: DC

## 2015-12-11 MED ORDER — ENOXAPARIN SODIUM 80 MG/0.8ML ~~LOC~~ SOLN
80.0000 mg | SUBCUTANEOUS | Status: DC
  Administered 2015-12-11 – 2015-12-12 (×2): 80 mg via SUBCUTANEOUS
  Filled 2015-12-11 (×2): qty 0.8

## 2015-12-11 NOTE — Progress Notes (Signed)
OT Cancellation Note  Patient Details Name: Allayne StackDevon Jermaine Bertelson MRN: [id number] DOB: 1981-08-15   Cancelled Treatment:    Reason Eval/Treat Not Completed: Other (comment) (Pt from SNF, plan to return to SNF; defer OT to next venue). CM notified to re-consult OT if change in discharge disposition occurs or OT eval is needed for SNF placement. Thank you for this referral.   Gaye AlkenBailey A Karon Cotterill M.S., OTR/L Pager: [phone]  12/11/2015, 2:45 PM

## 2015-12-11 NOTE — Evaluation (Signed)
Physical Therapy Evaluation Patient Details Name: Todd Parrish MRN: [id number] DOB: 27-May-1981 Today's Date: 12/11/2015   History of Present Illness  Patient s/p ORIF Rt tibial plateau fx with wound vac placement. Pt with PMHx of vertigo, HIV, and hernia  Clinical Impression  Patient is s/p above surgery presenting with functional limitations due to the deficits listed below (see PT Problem List). Tolerated bed mobility and transfer training today with min-mod +2 assist. Maintains NWB on Rt with slight assistance. Requires much encouragement to participate with therapy. Patient will benefit from skilled PT to increase their independence and safety with mobility to allow discharge to the venue listed below.       Follow Up Recommendations SNF;Supervision/Assistance - 24 hour    Equipment Recommendations  None recommended by PT    Recommendations for Other Services       Precautions / Restrictions Precautions Precautions: Fall Restrictions Weight Bearing Restrictions: Yes RLE Weight Bearing: Non weight bearing      Mobility  Bed Mobility Overal bed mobility: Needs Assistance;+2 for physical assistance Bed Mobility: Supine to Sit     Supine to sit: Mod assist;HOB elevated;+2 for safety/equipment     General bed mobility comments: Mod assist for RLE support out of bed and for patient to pull through PTs hand.  Cues for technique. Required extra time.  Transfers Overall transfer level: Needs assistance Equipment used: Rolling walker (2 wheeled) Transfers: Sit to/from Stand Sit to Stand: +2 physical assistance;Mod assist;From elevated surface Stand pivot transfers: Min assist;+2 safety/equipment       General transfer comment: Mod assist for boost to stand +2 support with cues for technique and hand placement. Pt impatient and wanting to attempt his way which was not successful. Once LEs placed within RW and pt cued to use Lt hand to push up from bed he was able  to safely complete with our assistance. Min assist to maintain NWB on Rt and for control of RW with pivot to chair. Poor control with descent.  Ambulation/Gait                Stairs            Wheelchair Mobility    Modified Rankin (Stroke Patients Only)       Balance Overall balance assessment: Needs assistance Sitting-balance support: Single extremity supported Sitting balance-Leahy Scale: Poor     Standing balance support: Bilateral upper extremity supported Standing balance-Leahy Scale: Poor                               Pertinent Vitals/Pain Pain Assessment: Faces Faces Pain Scale: Hurts whole lot Pain Location: RLE Pain Descriptors / Indicators: Aching Pain Intervention(s): Monitored during session;Repositioned;Limited activity within patient's tolerance    Home Living Family/patient expects to be discharged to:: Skilled nursing facility Living Arrangements: Children   Type of Home: House Home Access: Ramped entrance     Home Layout: One level Home Equipment: None      Prior Function Level of Independence: Independent               Hand Dominance        Extremity/Trunk Assessment   Upper Extremity Assessment: Defer to OT evaluation           Lower Extremity Assessment: RLE deficits/detail RLE Deficits / Details: Knee in immobilizer. Able to move Rt ankle without difficulty       Communication   Communication:  No difficulties  Cognition Arousal/Alertness: Awake/alert Behavior During Therapy: WFL for tasks assessed/performed Overall Cognitive Status: Within Functional Limits for tasks assessed                      General Comments General comments (skin integrity, edema, etc.): educated on knee brace use    Exercises General Exercises - Lower Extremity Ankle Circles/Pumps: AROM;Both;10 reps;Seated Quad Sets: Strengthening;Both;10 reps;Seated      Assessment/Plan    PT Assessment Patient needs  continued PT services  PT Diagnosis Difficulty walking;Acute pain   PT Problem List Decreased strength;Decreased range of motion;Decreased activity tolerance;Decreased balance;Decreased mobility;Decreased knowledge of use of DME;Obesity;Pain  PT Treatment Interventions DME instruction;Gait training;Therapeutic activities;Functional mobility training;Therapeutic exercise;Balance training;Neuromuscular re-education;Patient/family education;Modalities   PT Goals (Current goals can be found in the Care Plan section) Acute Rehab PT Goals Patient Stated Goal: to return home after rehab PT Goal Formulation: With patient Time For Goal Achievement: 12/24/15 Potential to Achieve Goals: Good    Frequency Min 5X/week   Barriers to discharge Decreased caregiver support      Co-evaluation               End of Session Equipment Utilized During Treatment: Gait belt Activity Tolerance: Patient limited by pain Patient left: in chair;with call bell/phone within reach;with family/visitor present;with SCD's reapplied Nurse Communication: Mobility status;Weight bearing status         Time: [id number] PT Time Calculation (min) (ACUTE ONLY): 34 min   Charges:   PT Evaluation $Initial PT Evaluation Tier I: 1 Procedure PT Treatments $Therapeutic Activity: 8-22 mins   PT G CodesBerton Mount:        Keauna Brasel S 12/11/2015, 11:38 AM Charlsie MerlesLogan Secor Darinda [place], PT [phone]

## 2015-12-11 NOTE — [place] FL2 (Signed)
[place] MEDICAID FL2 LEVEL OF CARE SCREENING TOOL     IDENTIFICATION  Patient Name: Todd StackDevon Jermaine Lisenbee Birthdate: [birth year] Sex: male Admission Date (Current Location): 12/10/2015  [facility] [place]   [facility] and [facility]  [facility] [address], [place], [place] [address]      Provider Number: [id number]  Attending Physician Name and Address:  Sheral Apleyimothy D Murphy, MD  Relative Name and Phone Number:       Current Level of Care: Hospital Recommended Level of Care: Skilled Nursing Facility Prior Approval Number:    Date Approved/Denied:   PASRR Number: [id number] [id number]  Discharge Plan: SNF    Current Diagnoses: Patient Active Problem List   Diagnosis Date Noted  . Constipation due to opioid therapy 12/03/2015  . Seasonal allergies 12/03/2015  . HIV antibody positive (HCC)   . Left inguinal hernia   . Right medial tibial plateau fracture 11/25/2015  . Tibial plateau fracture 11/25/2015    Orientation RESPIRATION BLADDER Height & Weight    Self, Time, Situation, Place  O2 Continent 5\' 7"  (170.2 cm) 340 lbs.  BEHAVIORAL SYMPTOMS/MOOD NEUROLOGICAL BOWEL NUTRITION STATUS  Other (Comment) (n/a)  (n/a) Continent Diet (Please see discharge summary.)  AMBULATORY STATUS COMMUNICATION OF NEEDS Skin   Extensive Assist Verbally Wound Vac                       Personal Care Assistance Level of Assistance  Bathing, Feeding, Dressing Bathing Assistance: Limited assistance Feeding assistance: Independent Dressing Assistance: Limited assistance     Functional Limitations Info   (n/a)          SPECIAL CARE FACTORS FREQUENCY  PT (By licensed PT), OT (By licensed OT)     PT Frequency: 5 OT Frequency: 5            Contractures      Additional Factors Info  Code Status, Allergies Code Status Info: FULL Allergies Info: Morphine and Related           Current Medications (12/11/2015):  This is the  current hospital active medication list Current Facility-Administered Medications  Medication Dose Route Frequency Provider Last Rate Last Dose  . 0.45 % NaCl with KCl 20 mEq / L infusion   Intravenous Continuous Janalee DaneBrittney Kelly, PA-C 100 mL/hr at 12/11/15 0058    . acetaminophen (TYLENOL) tablet 650 mg  650 mg Oral Q6H PRN Janalee DaneBrittney Kelly, PA-C       Or  . acetaminophen (TYLENOL) suppository 650 mg  650 mg Rectal Q6H PRN Brittney Kelly, PA-C      . diazepam (VALIUM) tablet 5 mg  5 mg Oral Q6H PRN Janalee DaneBrittney Kelly, PA-C   5 mg at 12/11/15 0424  . docusate sodium (COLACE) capsule 100 mg  100 mg Oral BID Janalee DaneBrittney Kelly, PA-C   100 mg at 12/11/15 1144  . enoxaparin (LOVENOX) injection 80 mg  80 mg Subcutaneous Q24H Brittney Kelly, PA-C   80 mg at 12/11/15 0843  . gabapentin (NEURONTIN) capsule 300 mg  300 mg Oral QHS Brittney Kelly, PA-C   300 mg at 12/10/15 2132  . HYDROmorphone (DILAUDID) injection 1 mg  1 mg Intravenous Q2H PRN Janalee DaneBrittney Kelly, PA-C   1 mg at 12/11/15 0353  . meclizine (ANTIVERT) tablet 12.5 mg  12.5 mg Oral TID PRN Janalee DaneBrittney Kelly, PA-C      . metoCLOPramide (REGLAN) tablet 5-10 mg  5-10 mg Oral Q8H PRN Janalee DaneBrittney Kelly, PA-C  Or  . metoCLOPramide (REGLAN) injection 5-10 mg  5-10 mg Intravenous Q8H PRN Brittney Kelly, PA-C      . ondansetron (ZOFRAN) tablet 4 mg  4 mg Oral Q6H PRN Janalee Dane, PA-C       Or  . ondansetron (ZOFRAN) injection 4 mg  4 mg Intravenous Q6H PRN Brittney Kelly, PA-C      . oxyCODONE (Oxy IR/ROXICODONE) immediate release tablet 5-10 mg  5-10 mg Oral Q3H PRN Janalee Dane, PA-C   10 mg at 12/11/15 1144  . pneumococcal 23 valent vaccine (PNU-IMMUNE) injection 0.5 mL  0.5 mL Intramuscular Tomorrow-1000 Sheral Apley, MD      . polyethylene glycol (MIRALAX / GLYCOLAX) packet 17 g  17 g Oral Daily Brittney Kelly, PA-C   17 g at 12/10/15 1602     Discharge Medications: Please see discharge summary for a list of discharge medications.  Relevant  Imaging Results:  Relevant Lab Results:   Additional Information Social Security #: [id number]  Rojelio Brenner [id number]

## 2015-12-11 NOTE — Progress Notes (Signed)
     Subjective:  POD#1 ORIF R tibial plateau fx. Patient reports pain as moderate.  Resting comfortably in bed this morning. Will likely need one more night to improve pain control and mobilization.  Will plan for discharge back to SNF tomorrow with the wound vac.    Objective:   VITALS:   Filed Vitals:   12/10/15 1900 12/10/15 2131 12/11/15 0148 12/11/15 0537  BP: 158/70 136/81 158/72 135/55  Pulse: 110 103 104 107  Temp: 97.7 F (36.5 C) 98.5 F (36.9 C) 98.4 F (36.9 C) 98.5 F (36.9 C)  TempSrc: Oral Oral Oral Oral  Resp: 18 16 14 18   Height:      Weight:      SpO2: 97% 96% 99% 95%    Neurologically intact ABD soft Neurovascular intact Sensation intact distally Intact pulses distally Dorsiflexion/Plantar flexion intact Incision: dressing C/D/I Wound vac to the R leg, no output so far Knee immobilizer to the R leg  Lab Results  Component Value Date   WBC 13.9* 12/10/2015   HGB 8.8* 12/11/2015   HCT 27.2* 12/11/2015   MCV 81.1 12/10/2015   PLT 255 12/10/2015   BMET    Component Value Date/Time   NA 132* 12/10/2015 0858   K 4.1 12/10/2015 0858   CL 101 11/26/2015 0705   CO2 25 11/26/2015 0705   GLUCOSE 117* 11/26/2015 0705   BUN 8 11/26/2015 0705   CREATININE 0.75 12/10/2015 1605   CALCIUM 8.4* 11/26/2015 0705   GFRNONAA >60 12/10/2015 1605   GFRAA >60 12/10/2015 1605     Assessment/Plan: 1 Day Post-Op   Active Problems:   Tibial plateau fracture   Up with therapy NWB in the RLE, knee immobilizer at all times Lovenox for DVT prophylaxis Will plan for discharge to facility tomorrow  Mikeila [name] Marie 12/11/2015, 8:20 AM Cell [phone]

## 2015-12-12 ENCOUNTER — Non-Acute Institutional Stay (SKILLED_NURSING_FACILITY): Payer: Medicaid Other | Admitting: Internal Medicine

## 2015-12-12 ENCOUNTER — Encounter: Payer: Self-pay | Admitting: Internal Medicine

## 2015-12-12 DIAGNOSIS — K5903 Drug induced constipation: Secondary | ICD-10-CM

## 2015-12-12 DIAGNOSIS — G629 Polyneuropathy, unspecified: Secondary | ICD-10-CM

## 2015-12-12 DIAGNOSIS — H8113 Benign paroxysmal vertigo, bilateral: Secondary | ICD-10-CM

## 2015-12-12 DIAGNOSIS — Z6841 Body Mass Index (BMI) 40.0 and over, adult: Secondary | ICD-10-CM | POA: Diagnosis present

## 2015-12-12 DIAGNOSIS — T402X5A Adverse effect of other opioids, initial encounter: Secondary | ICD-10-CM

## 2015-12-12 DIAGNOSIS — Z21 Asymptomatic human immunodeficiency virus [HIV] infection status: Secondary | ICD-10-CM

## 2015-12-12 DIAGNOSIS — S82141D Displaced bicondylar fracture of right tibia, subsequent encounter for closed fracture with routine healing: Secondary | ICD-10-CM | POA: Diagnosis not present

## 2015-12-12 DIAGNOSIS — K4090 Unilateral inguinal hernia, without obstruction or gangrene, not specified as recurrent: Secondary | ICD-10-CM

## 2015-12-12 NOTE — Clinical Social Work Note (Signed)
Clinical Social Work Assessment  Patient Details  Name: Todd Parrish MRN: [id number] Date of Birth: 1981-12-10  Date of referral:  12/12/15               Reason for consult:  Facility Placement, Discharge Planning                Permission sought to share information with:  Chartered certified accountant granted to share information::  Yes, Verbal Permission Granted  Name::        Agency::  [facility] (previously [facility])  Relationship::     Contact Information:     Housing/Transportation Living arrangements for the past 2 months:  [place] (Admitted from [facility] (previously [facility] Source of Information:  Patient Patient Interpreter Needed:  None Criminal Activity/Legal Involvement Pertinent to Current Situation/Hospitalization:  No - Comment as needed Significant Relationships:  Siblings Lives with:  Self, Facility Resident Do you feel safe going back to the place where you live?  Yes (Returning to SNF at discharge.) Need for family participation in patient care:  No (Coment) (Patient able to make own decisions.)  Care giving concerns:  Patient expressed no concerns at this time.  Social Worker assessment / plan:  CSW received referral stating patient admitted from [facility] and to return to SNF at time of discharge. CSW contacted by [facility] admissions coordinator stating patient to return to [facility] facility, but will return to [facility] instead of [facility]. CSW met with patient to confirm patient agreeable to discharge plan. Patient verbally stated patient agreeable to discharge plan. CSW to continue to follow and assist with discharge planning needs.  CSW Director approved continued LOG placement.  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  Patient/Family's Understanding of and Emotional Response to Diagnosis, Current Treatment, and Prognosis:  Patient understanding and agreeable to CSW plan of care.  Emotional Assessment Appearance:  Appears younger than stated age Attitude/Demeanor/Rapport:  Other (Pleasant.) Affect (typically observed):  Accepting, Adaptable, Appropriate, Pleasant Orientation:  Oriented to Self, Oriented to Place, Oriented to  Time, Oriented to Situation Alcohol / Substance use:  Not Applicable Psych involvement (Current and /or in the community):  No (Comment) (Not appropriate on this admission.)  Discharge Needs  Concerns to be addressed:  No discharge needs identified Readmission within the last 30 days:  Yes Current discharge risk:  None Barriers to Discharge:  No Barriers Identified   Caroline Sauger, LCSW 12/12/2015, 12:03 PM [phone]

## 2015-12-12 NOTE — Progress Notes (Signed)
Called PA Brittney to clarify if patient needs a temporary transport VAC when [facility]. PA stated that he can leave just with a clampped VAC access. Order carry out and communicated with CM and SW. I also measured the VAC dressing size and reported to SW.

## 2015-12-12 NOTE — Assessment & Plan Note (Addendum)
OPEN REDUCTION INTERNAL FIXATION (ORIF) RIGHT BICONDYLAR PLATEAU  RIGHT KNEE ARTHROTOMY FASCIOTOMY. APPLICATION OF WOUND VAC RIGHT LOWER LEG on 12/20/201  SNF - OT/PT and wound vac care

## 2015-12-12 NOTE — Discharge Planning (Signed)
Patient to be discharged to [facility]. Patient updated at bedside.  Facility[facility] RN report number: [phone] Transportation: [facility]  Vanshika Jastrzebski, [place] [phone] Orthopedics: [id number] Surgical: [phone]
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
## 2015-12-12 NOTE — Progress Notes (Signed)
     Subjective:  POD#2 ORIF R tibial plateau fracture. Patient reports pain as moderate.  Resting comfortably in bed this morning.  Up with assistance with PT yesterday.  Pain managed with oral meds.  Appropriate for discharge to SNF today with the wound vac in place.  Will have vac removed in our office in 10 days.  Objective:   VITALS:   Filed Vitals:   12/11/15 0537 12/11/15 1300 12/11/15 2240 12/12/15 0627  BP: 135/55 131/68 155/70 153/74  Pulse: 107 101 104 106  Temp: 98.5 F (36.9 C) 98.1 F (36.7 C) 98.7 F (37.1 C) 98.7 F (37.1 C)  TempSrc: Oral Oral Oral Oral  Resp: 18 16 20 18   Height:      Weight:      SpO2: 95% 98% 100% 94%    Neurologically intact ABD soft Neurovascular intact Sensation intact distally Intact pulses distally Dorsiflexion/Plantar flexion intact Incision: dressing C/D/I Wound vac actively connected to R leg Knee immobilizer to R leg  Lab Results  Component Value Date   WBC 13.9* 12/10/2015   HGB 8.8* 12/11/2015   HCT 27.2* 12/11/2015   MCV 81.1 12/10/2015   PLT 255 12/10/2015   BMET    Component Value Date/Time   NA 132* 12/10/2015 0858   K 4.1 12/10/2015 0858   CL 101 11/26/2015 0705   CO2 25 11/26/2015 0705   GLUCOSE 117* 11/26/2015 0705   BUN 8 11/26/2015 0705   CREATININE 0.75 12/10/2015 1605   CALCIUM 8.4* 11/26/2015 0705   GFRNONAA >60 12/10/2015 1605   GFRAA >60 12/10/2015 1605     Assessment/Plan: 2 Days Post-Op   Active Problems:   Tibial plateau fracture   Up with therapy NWB in RLE, knee immobilizer full time Lovenox for DVT prophylaxis Will be discharged back to SNF today with wound vac.  Will have vac removed in 10 days in our office.   Amanie Mcculley [name] 12/12/2015, 8:05 AM Cell [phone]

## 2015-12-12 NOTE — Progress Notes (Signed)
RN tried to call [facility] a couple of times, but was not able to give report due to nobody response to the phone. RN explained to the EMS transporters and gave a hand off report to the transporter before patient left.

## 2015-12-12 NOTE — Care Management Note (Signed)
Case Management Note  Patient Details  Name: Todd Parrish MRN: [id number] Date of Birth: 01/05/1981  Subjective/Objective:           S/p ORIF right tibial plateau         Action/Plan: Patient from SNF, referral made to CSW. Per CSW [facility]   Expected Discharge Date:                  Expected Discharge Plan:  Skilled Nursing Facility  In-House Referral:  Clinical Social Work  Discharge planning Services  CM Consult  Post Acute Care Choice:  NA Choice offered to:     DME Arranged:    DME Agency:     HH Arranged:    HH Agency:     Status of Service:  Completed, signed off  Medicare Important Message Given:    Date Medicare IM Given:    Medicare IM give by:    Date Additional Medicare IM Given:    Additional Medicare Important Message give by:     If discussed at Long Length of Stay Meetings, dates discussed:    Additional Comments:  Monica BectonKrieg, Kilian Schwartz Watson, RN 12/12/2015, 12:43 PM

## 2015-12-12 NOTE — Progress Notes (Signed)
PT Cancellation Note  Patient Details Name: Todd StackDevon [name] MRN: [id number] DOB: February 14, 1981   Cancelled Treatment:    Reason Eval/Treat Not Completed:  (patient deferred treatment as he is discharging to SNF today)   Olivia CanterMoton, Addilynne Olheiser M 12/12/2015, 1:14 PM

## 2015-12-16 ENCOUNTER — Encounter: Payer: Self-pay | Admitting: Internal Medicine

## 2015-12-18 ENCOUNTER — Other Ambulatory Visit: Payer: Self-pay

## 2015-12-18 ENCOUNTER — Encounter (HOSPITAL_COMMUNITY): Payer: Self-pay | Admitting: Orthopedic Surgery

## 2015-12-18 MED ORDER — OXYCODONE HCL 5 MG PO TABS: ORAL_TABLET | ORAL | Status: DC

## 2015-12-18 NOTE — Telephone Encounter (Signed)
RX faxed to [facility] @ [phone], phone number [phone] 

## 2015-12-23 ENCOUNTER — Encounter: Payer: Self-pay | Admitting: Internal Medicine

## 2015-12-23 DIAGNOSIS — G629 Polyneuropathy, unspecified: Secondary | ICD-10-CM | POA: Insufficient documentation

## 2015-12-23 DIAGNOSIS — H8110 Benign paroxysmal vertigo, unspecified ear: Secondary | ICD-10-CM | POA: Insufficient documentation
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
## 2015-12-23 NOTE — Assessment & Plan Note (Signed)
SNF - colace regularly BID

## 2015-12-23 NOTE — Assessment & Plan Note (Signed)
SNF - RLE, neurontin 300 mg qHS for numbness r leg

## 2015-12-23 NOTE — Assessment & Plan Note (Signed)
SNF -Long time problem, usually in the am; continue antivert TID prn

## 2015-12-23 NOTE — Assessment & Plan Note (Signed)
SNF - has been there for years;pt can f/u as outpt

## 2016-01-02 ENCOUNTER — Ambulatory Visit: Payer: Self-pay | Admitting: Internal Medicine

## 2016-01-09 ENCOUNTER — Non-Acute Institutional Stay (SKILLED_NURSING_FACILITY): Payer: Medicaid Other | Admitting: Internal Medicine

## 2016-01-09 ENCOUNTER — Encounter: Payer: Self-pay | Admitting: Internal Medicine

## 2016-01-09 DIAGNOSIS — K4090 Unilateral inguinal hernia, without obstruction or gangrene, not specified as recurrent: Secondary | ICD-10-CM | POA: Diagnosis not present

## 2016-01-09 DIAGNOSIS — T402X5A Adverse effect of other opioids, initial encounter: Secondary | ICD-10-CM | POA: Diagnosis not present

## 2016-01-09 DIAGNOSIS — G629 Polyneuropathy, unspecified: Secondary | ICD-10-CM

## 2016-01-09 DIAGNOSIS — H8113 Benign paroxysmal vertigo, bilateral: Secondary | ICD-10-CM

## 2016-01-09 DIAGNOSIS — Z21 Asymptomatic human immunodeficiency virus [HIV] infection status: Secondary | ICD-10-CM | POA: Diagnosis not present

## 2016-01-09 DIAGNOSIS — S82141D Displaced bicondylar fracture of right tibia, subsequent encounter for closed fracture with routine healing: Secondary | ICD-10-CM

## 2016-01-09 DIAGNOSIS — K5903 Drug induced constipation: Secondary | ICD-10-CM | POA: Diagnosis not present
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
## 2016-01-12 ENCOUNTER — Encounter (HOSPITAL_COMMUNITY): Payer: Self-pay | Admitting: Nurse Practitioner

## 2016-01-12 ENCOUNTER — Emergency Department (HOSPITAL_COMMUNITY)
Admission: EM | Admit: 2016-01-12 | Discharge: 2016-01-13 | Disposition: A | Discharge status: Skilled Nursing Facility | Payer: Medicaid Other | Attending: Emergency Medicine | Admitting: Emergency Medicine

## 2016-01-12 DIAGNOSIS — Z8719 Personal history of other diseases of the digestive system: Secondary | ICD-10-CM | POA: Insufficient documentation

## 2016-01-12 DIAGNOSIS — Z719 Counseling, unspecified: Secondary | ICD-10-CM

## 2016-01-12 DIAGNOSIS — G629 Polyneuropathy, unspecified: Secondary | ICD-10-CM

## 2016-01-12 DIAGNOSIS — X58XXXD Exposure to other specified factors, subsequent encounter: Secondary | ICD-10-CM | POA: Insufficient documentation

## 2016-01-12 DIAGNOSIS — Z7409 Other reduced mobility: Secondary | ICD-10-CM

## 2016-01-12 DIAGNOSIS — S82201D Unspecified fracture of shaft of right tibia, subsequent encounter for closed fracture with routine healing: Secondary | ICD-10-CM | POA: Diagnosis not present

## 2016-01-12 DIAGNOSIS — Z79899 Other long term (current) drug therapy: Secondary | ICD-10-CM | POA: Insufficient documentation

## 2016-01-12 DIAGNOSIS — Z741 Need for assistance with personal care: Secondary | ICD-10-CM | POA: Diagnosis present

## 2016-01-12 DIAGNOSIS — S82141D Displaced bicondylar fracture of right tibia, subsequent encounter for closed fracture with routine healing: Secondary | ICD-10-CM

## 2016-01-12 DIAGNOSIS — Z7982 Long term (current) use of aspirin: Secondary | ICD-10-CM | POA: Insufficient documentation
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
## 2016-01-13 ENCOUNTER — Ambulatory Visit: Payer: Self-pay | Admitting: Infectious Diseases

## 2016-01-13 ENCOUNTER — Encounter: Payer: Self-pay | Admitting: *Deleted

## 2016-01-13 MED ORDER — ASPIRIN EC 325 MG PO TBEC: 325.0000 mg | DELAYED_RELEASE_TABLET | Freq: Every day | ORAL | Status: DC

## 2016-01-13 MED ORDER — ZIPRASIDONE MESYLATE 20 MG IM SOLR: 20.0000 mg | Freq: Once | INTRAMUSCULAR | Status: DC

## 2016-01-13 MED ORDER — MECLIZINE HCL 25 MG PO TABS: 12.5000 mg | ORAL_TABLET | Freq: Three times a day (TID) | ORAL | Status: DC | PRN

## 2016-01-13 MED ORDER — OXYCODONE HCL 5 MG PO TABS: 5.0000 mg | ORAL_TABLET | ORAL | Status: DC | PRN

## 2016-01-13 MED ORDER — DOCUSATE SODIUM 100 MG PO CAPS: 100.0000 mg | ORAL_CAPSULE | Freq: Two times a day (BID) | ORAL | Status: DC

## 2016-01-13 MED ORDER — METHOCARBAMOL 500 MG PO TABS: 500.0000 mg | ORAL_TABLET | Freq: Four times a day (QID) | ORAL | Status: DC

## 2016-01-13 MED ORDER — GABAPENTIN 100 MG PO CAPS: 300.0000 mg | ORAL_CAPSULE | Freq: Every day | ORAL | Status: DC
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
## 2016-01-13 NOTE — Progress Notes (Addendum)
1328 updated Cm supervisor  [address] Spoke with Octavia Bruckner of [place] who confirm they got referral but did not engage services - home safety check possibly from [facility] staff 1230 Cm met with pt again who state "janice and steven" helped him to get an agency to his home and they had done the first "home safety check" and "was scheduled to see him "today"  Pt still not sure but believes agency is [place].  Cm called [facility] in house [place] staff, Tim as pt called [facility] for "[employer] confirmed with CM while she was in the room with pt at 1209 that pt is active with [place]  Cm provided pt with the following resources:   List of [place] accepting [employer], list of [place] uninsured patient resources, list of [place] private duty nursing services, list of [place] crisis assistance programs and a list of [place] home health agencies  CM reviewed in details home health guidelines, home health ([place]) (length of stay in home, types of [facility] staff available, coverage, primary caregiver, up to 24 hrs before services may be started) and Private duty nursing (PDN-coverage, length of stay in the home types of staff available).  CM medicaid list given to pt so he could be active participant in choosing [place] when and if medicaid is active Discussed 30 -45 day time it takes for approval.  Pt is aware and states he believes he will get medicaid Reports his children has medicaid Discussed the importance of pcp to write further orders for home health and to have when released by surgeon and/or orthopedic provider CM spoke with pt who confirms uninsured [employer] resident with no pcp.  CM discussed and provided written information for uninsured accepting pcps, discussed the importance of pcp vs EDP services for f/u care, www.needymeds.org, www.goodrx.com, discounted pharmacies and other [employer] such as [employer], affordable care act,  financial assistance, uninsured dental services, [place] med assist, DSS and  health department  Reviewed resources for [employer] uninsured accepting pcps like Jinny Blossom, family medicine at [employer], [facility], [facility], local urgent care centers, [facility], [facility] family practice, general medical clinics, family services of the [place], [facility] urgent care plus others, medication resources, [facility] out patient pharmacies and housing Pt voiced understanding and appreciation of resources provided   Provided [facility] contact information Pt agreed to a referral Cm completed referral Pt to be contact by [facility] clinical liason  A list of [place] crisis assistance programs offered because 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 Reporting having a mother remaining in [place] who will return his children when he starts walking well, "I am the youngest", " I am spoiled", and he has two sisters both nurses; one travels as a Marine scientist and the sister is a step sister living in [place] as a Marine scientist but has kids and works long hours and he is a single parent These resources offer emergency crisis programs for utility, rent, bills, food, legal aid, jobs/employment, housing, counselors, Futures trader came to room prior to [employer] leaving Cm inquired of pt if he left snf AMA and pt informed CM with Surveyor, quantity present he did not leave AMA

## 2016-01-13 NOTE — ED Notes (Signed)
Bed: ZO10 Expected date:  Expected time:  Means of arrival:  Comments: Margo Aye A

## 2016-01-13 NOTE — ED Provider Notes (Signed)
Rehabilitation placement has been arranged. I have assessed the patient, he is alert and nontoxic. He has no complaints at this time. He reports he does not have assistance at home to deal with his care of his tibial plateau fracture. He reports he is getting regular wound care through surgery and wound management. He however is unable to accomplish activities of daily living without assistance and he has none at home. He reports the wound management is going well he does not have complaints regarding his wound.  Home medications have been reordered for his nursing facility.  Arby Barrette, MD 01/13/16 1430

## 2016-01-13 NOTE — Progress Notes (Signed)
ED CM consulted by ED SW who states pt is at baseline walking with a walker and pt wanting further snf placement; not meeting criteria  Pt was d/c from [facility] to [facility] on 12/12/15 EPIC indicates pt d/c home from [place] on 01/09/16  with HH/OT/PT set up by snf staff

## 2016-01-13 NOTE — NC FL2 (Signed)
  [place] MEDICAID FL2 LEVEL OF CARE SCREENING TOOL     IDENTIFICATION  Patient Name: Todd Parrish Birthdate: December 02, 1981 Sex: male Admission Date (Current Location): 01/12/2016  [facility] and [place] Number:  Producer, television/film/video and Address:  [facility],  [address], [place] [address]      Provider Number: [id number]  Attending Physician Name and Address:  Provider Default, MD  Relative Name and Phone Number:  Patient's sister Amy [phone]    Current Level of Care: Hospital Recommended Level of Care: Skilled Nursing Facility Prior Approval Number:    Date Approved/Denied:   PASRR Number: [id number]  Discharge Plan: SNF    Current Diagnoses: Patient Active Problem List   Diagnosis Date Noted  . Neuropathy (HCC) 12/23/2015  . Benign paroxysmal positional vertigo 12/23/2015  . Morbid obesity (HCC) 12/12/2015  . Constipation due to opioid therapy 12/03/2015  . Seasonal allergies 12/03/2015  . HIV antibody positive (HCC)   . Left inguinal hernia   . Right medial tibial plateau fracture 11/25/2015  . Tibial plateau fracture 11/25/2015    Orientation RESPIRATION BLADDER Height & Weight    Self, Time, Situation, Place  Normal Continent   340 lbs.  BEHAVIORAL SYMPTOMS/MOOD NEUROLOGICAL BOWEL NUTRITION STATUS  Other (Comment) (n/a)  (n/a) Continent Diet  AMBULATORY STATUS COMMUNICATION OF NEEDS Skin   Extensive Assist Verbally  (Right lower extremity immobilized with a long-leg immobilizer )                       Personal Care Assistance Level of Assistance  Bathing, Dressing, Feeding Bathing Assistance: Maximum assistance (1-2 assist) Feeding assistance: Independent Dressing Assistance: Maximum assistance (1-2 assist)     Functional Limitations Info   (n/a)          SPECIAL CARE FACTORS FREQUENCY  PT (By licensed PT)     PT Frequency: 3 x a week              Contractures Contractures Info: Not present     Additional Factors Info  Code Status, Allergies Code Status Info: FULL code status Allergies Info: Morphine and Related           Current Medications (01/13/2016):  This is the current hospital active medication list No current facility-administered medications for this encounter.   Current Outpatient Prescriptions  Medication Sig Dispense Refill  . aspirin 325 MG tablet Take 325 mg by mouth daily.    Marland Kitchen docusate sodium (COLACE) 100 MG capsule Take 1 capsule (100 mg total) by mouth 2 (two) times daily. 10 capsule 0  . gabapentin (NEURONTIN) 300 MG capsule Take 300 mg by mouth at bedtime.    . methocarbamol (ROBAXIN) 500 MG tablet Take 1 tablet (500 mg total) by mouth 4 (four) times daily. 60 tablet 0  . oxycodone (OXY-IR) 5 MG capsule Take 5 mg by mouth every 4 (four) hours as needed for pain.     . meclizine (ANTIVERT) 12.5 MG tablet Take 12.5 mg by mouth 3 (three) times daily as needed for dizziness.       Discharge Medications: Please see discharge summary for a list of discharge medications.  Relevant Imaging Results:  Relevant Lab Results:   Additional Information Social Security #: [id number]  Claudean Severance, LCSW

## 2016-01-13 NOTE — Discharge Instructions (Signed)
Nondisplaced Tibial Plateau Fracture °A tibial plateau fracture is a break in the bone that forms the bottom of your knee joint (tibia or shin bone). The lower end of your thigh bone (femur) forms the upper surface of your knee joint. The top of the tibia has a flat, smooth surface (tibial plateau). This part of the shin bone is made of softer bone than the shaft of the shin bone. If a strong force shoves your femur down into your tibial plateau, the tibial plateau can collapse or break away at the edges. This is also called an intra-articular fracture. °A nondisplaced fracture means that the broken piece or pieces of your tibial plateau have not moved out of their normal position. This type of fracture can usually be treated without surgery. You will need to wear a brace or a splint and avoid using your knee to support your body weight while the fracture is healing. °CAUSES °Common causes of this type of fracture include: °· Car accidents. °· Jumps or falls from a significant height. °· Injuries from high-energy sports or contact sports. °RISK FACTORS °You may be at higher risk for this type of fracture if: °· You play high-energy sports or contact sports. °· You have a history of bone infections. °· You are an older person with a condition that causes weak bones (osteoporosis). °SIGNS AND SYMPTOMS °Signs and symptoms of a nondisplaced tibial plateau fracture begin immediately after the injury. They may include: °· Pain that is worse when you use your knee to support your body weight. °· Swelling of your knee. °· Bruising around your knee. °DIAGNOSIS °Your health care provider may suspect a nondisplaced tibial plateau fracture based on your signs and symptoms, especially if you had a recent injury. Your health care provider will also do a physical exam. This may include imaging tests such as: °· X-ray of your knee. This is used to confirm the diagnosis. °· CT scan or MRI. This checks to make sure that no bones have  moved out of place and that there are no other injuries to your knee. °TREATMENT °Treatment for a nondisplaced tibial plateau fracture may involve wearing a brace or a splint to keep your leg in one position while it heals. You may also need to use crutches, a scooter, a walker, or a wheelchair so you can move around without using your injured leg to support your body weight. You may also be prescribed pain medicine. °While your knee is healing, you may see a physical therapist. Your physical therapist will move your knee without putting weight on it (passive exercise). This helps to keep your knee from becoming stiff. After your leg has healed, your health care provider will show you how to do range-of-motion exercises to strengthen your knee muscles and prevent stiffness. °HOME CARE INSTRUCTIONS °If You Have a Brace or a Splint: °· Wear it as directed by your health care provider. Remove it only as directed by your health care provider. °· Loosen the brace or splint if your toes become numb and tingle, or if they turn cold and blue. °Bathing °· Cover the brace or splint with a watertight plastic bag to protect it from water while you bathe or shower. Do not let the brace or splint get wet. °Managing Pain, Stiffness, and Swelling °· If directed, apply ice to the injured area: °¨ Put ice in a plastic bag. °¨ Place a towel between your skin and the bag. °¨ Leave the ice on for 20   minutes, 2-3 times a day. °· Move your toes and ankle often to avoid stiffness and to lessen swelling. °· Raise the injured area above the level of your heart while you are sitting or lying down. °Driving °· Do not drive or operate heavy machinery while taking pain medicine. °· Do not drive while wearing a brace or a splint on a leg that you use for driving. °Activity °· Return to your normal activities as directed by your health care provider. Ask your health care provider what activities are safe for you. °· Perform range-of-motion  exercises only as directed by your health care provider. °Safety °· Do not use the injured limb to support your body weight until your health care provider says that you can. Use crutches, a scooter, a walker, or a wheelchair as directed by your health care provider. °General Instructions °· Keep the brace or splint clean and dry. °· Do not use any tobacco products, including cigarettes, chewing tobacco, or electronic cigarettes. Tobacco can delay bone healing. If you need help quitting, ask your health care provider. °· Take medicines only as directed by your health care provider. °· Keep all follow-up visits as directed by your health care provider. This is important. °SEEK MEDICAL CARE IF: °· Your pain is getting worse. °· Your pain medicine is not helping. °SEEK IMMEDIATE MEDICAL CARE IF: °· You have very bad pain in your injured leg. °· You have swelling or redness in your foot that is getting worse. °· You begin to lose feeling in your foot or your toes. °· Your foot or your toes are cold or turn blue. °  °This information is not intended to replace advice given to you by your health care provider. Make sure you discuss any questions you have with your health care provider. °  °Document Released: 09/16/2005 Document Revised: 12/28/2014 Document Reviewed: 07/25/2014 °Elsevier Interactive Patient Education ©2016 [employer] ° °

## 2016-01-13 NOTE — ED Provider Notes (Signed)
Voice mail left on the daytime case manager phone detailing the patients needs and assistance we require for placement  Todd Bilis, MD 01/13/16 [phone]
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
## 2016-01-14 ENCOUNTER — Non-Acute Institutional Stay (SKILLED_NURSING_FACILITY): Payer: Medicaid Other | Admitting: Adult Health

## 2016-01-14 ENCOUNTER — Telehealth: Payer: Self-pay

## 2016-01-14 DIAGNOSIS — S82131S Displaced fracture of medial condyle of right tibia, sequela: Secondary | ICD-10-CM

## 2016-01-14 DIAGNOSIS — T402X5A Adverse effect of other opioids, initial encounter: Secondary | ICD-10-CM | POA: Diagnosis not present

## 2016-01-14 DIAGNOSIS — K5903 Drug induced constipation: Secondary | ICD-10-CM | POA: Diagnosis not present

## 2016-01-14 DIAGNOSIS — S82191S Other fracture of upper end of right tibia, sequela: Secondary | ICD-10-CM | POA: Diagnosis not present

## 2016-01-14 DIAGNOSIS — B20 Human immunodeficiency virus [HIV] disease: Secondary | ICD-10-CM

## 2016-01-14 NOTE — Telephone Encounter (Signed)
TC to client to followup from Sunday.  No answer, voice mail left to call me back

## 2016-01-15 ENCOUNTER — Non-Acute Institutional Stay (SKILLED_NURSING_FACILITY): Payer: Medicaid Other | Admitting: Internal Medicine

## 2016-01-15 DIAGNOSIS — G629 Polyneuropathy, unspecified: Secondary | ICD-10-CM | POA: Diagnosis not present

## 2016-01-15 DIAGNOSIS — K4090 Unilateral inguinal hernia, without obstruction or gangrene, not specified as recurrent: Secondary | ICD-10-CM

## 2016-01-15 DIAGNOSIS — K5903 Drug induced constipation: Secondary | ICD-10-CM

## 2016-01-15 DIAGNOSIS — T402X5A Adverse effect of other opioids, initial encounter: Secondary | ICD-10-CM | POA: Diagnosis not present

## 2016-01-15 DIAGNOSIS — B20 Human immunodeficiency virus [HIV] disease: Secondary | ICD-10-CM | POA: Diagnosis not present

## 2016-01-15 DIAGNOSIS — H8113 Benign paroxysmal vertigo, bilateral: Secondary | ICD-10-CM

## 2016-01-15 DIAGNOSIS — S82191S Other fracture of upper end of right tibia, sequela: Secondary | ICD-10-CM | POA: Diagnosis not present

## 2016-01-15 DIAGNOSIS — S82131S Displaced fracture of medial condyle of right tibia, sequela: Secondary | ICD-10-CM

## 2016-01-17 ENCOUNTER — Encounter: Payer: Self-pay | Admitting: Infectious Diseases

## 2016-01-17 ENCOUNTER — Ambulatory Visit (INDEPENDENT_AMBULATORY_CARE_PROVIDER_SITE_OTHER): Payer: Self-pay | Admitting: Infectious Diseases

## 2016-01-17 VITALS — BP 142/89 | HR 83 | Temp 97.8°F

## 2016-01-17 DIAGNOSIS — Z23 Encounter for immunization: Secondary | ICD-10-CM

## 2016-01-17 DIAGNOSIS — Z113 Encounter for screening for infections with a predominantly sexual mode of transmission: Secondary | ICD-10-CM

## 2016-01-17 DIAGNOSIS — S82191S Other fracture of upper end of right tibia, sequela: Secondary | ICD-10-CM

## 2016-01-17 DIAGNOSIS — S82131S Displaced fracture of medial condyle of right tibia, sequela: Secondary | ICD-10-CM

## 2016-01-17 DIAGNOSIS — B20 Human immunodeficiency virus [HIV] disease: Secondary | ICD-10-CM

## 2016-01-17 DIAGNOSIS — K5903 Drug induced constipation: Secondary | ICD-10-CM

## 2016-01-17 DIAGNOSIS — Z79899 Other long term (current) drug therapy: Secondary | ICD-10-CM

## 2016-01-17 DIAGNOSIS — T402X5A Adverse effect of other opioids, initial encounter: Secondary | ICD-10-CM

## 2016-01-17 LAB — COMPREHENSIVE METABOLIC PANEL
ALBUMIN: 3.9 g/dL (ref 3.6–5.1)
ALT: 42 U/L (ref 9–46)
AST: 19 U/L (ref 10–40)
Alkaline Phosphatase: 85 U/L (ref 40–115)
BILIRUBIN TOTAL: 0.3 mg/dL (ref 0.2–1.2)
BUN: 9 mg/dL (ref 7–25)
CO2: 25 mmol/L (ref 20–31)
CREATININE: 0.85 mg/dL (ref 0.60–1.35)
Calcium: 9.2 mg/dL (ref 8.6–10.3)
Chloride: 102 mmol/L (ref 98–110)
Glucose, Bld: 102 mg/dL — ABNORMAL HIGH (ref 65–99)
Potassium: 4.1 mmol/L (ref 3.5–5.3)
SODIUM: 139 mmol/L (ref 135–146)
TOTAL PROTEIN: 7.9 g/dL (ref 6.1–8.1)

## 2016-01-17 LAB — T-HELPER CELL (CD4) - (RCID CLINIC ONLY)
CD4 T CELL ABS: 420 /uL (ref 400–2700)
CD4 T CELL HELPER: 22 % — AB (ref 33–55)

## 2016-01-17 LAB — CBC
HCT: 40.3 % (ref 39.0–52.0)
HEMOGLOBIN: 12.5 g/dL — AB (ref 13.0–17.0)
MCH: 24.9 pg — AB (ref 26.0–34.0)
MCHC: 31 g/dL (ref 30.0–36.0)
MCV: 80.1 fL (ref 78.0–100.0)
PLATELETS: 188 10*3/uL (ref 150–400)
RBC: 5.03 MIL/uL (ref 4.22–5.81)
RDW: 14.3 % (ref 11.5–15.5)
WBC: 7 10*3/uL (ref 4.0–10.5)

## 2016-01-17 LAB — LIPID PANEL
CHOLESTEROL: 162 mg/dL (ref 125–200)
HDL: 40 mg/dL (ref >=40)
LDL CALC: 98 mg/dL (ref <130)
Total CHOL/HDL Ratio: 4.1 Ratio (ref <=5.0)
Triglycerides: 121 mg/dL (ref <150)
VLDL: 24 mg/dL (ref <30)

## 2016-01-17 NOTE — Assessment & Plan Note (Signed)
Will continue to follow up with surgery, rehab Dressing change today.  Can get dressing supplies at rehab.

## 2016-01-17 NOTE — Assessment & Plan Note (Signed)
Has lost 20#, will encourage him to continue to lose wt.
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
## 2016-01-17 NOTE — Assessment & Plan Note (Signed)
Todd Parrish is convinced some one called him and told him his 3rd test was negative and he is HIV- I re-assured him that he has had 3 tests positive including  RNA.  Will repeat his tests today to further convince him His CD4 is intact I doubt he is having sex in rehab center.  He refuses PNVx and flu Will give him 2nd hep B rtc in 2 weeks

## 2016-01-17 NOTE — Assessment & Plan Note (Signed)
Now off opioids, improving.

## 2016-01-17 NOTE — Addendum Note (Signed)
Addended by: Alesia Morin F on: 01/17/2016 12:01 PM   Modules accepted: Orders

## 2016-01-18 LAB — HEPATITIS B CORE ANTIBODY, TOTAL: Hep B Core Total Ab: NONREACTIVE

## 2016-01-18 LAB — HEPATITIS B SURFACE ANTIGEN: HEP B S AG: NEGATIVE

## 2016-01-18 LAB — HEPATITIS B SURFACE ANTIBODY,QUALITATIVE: Hep B S Ab: NEGATIVE

## 2016-01-18 LAB — RPR

## 2016-01-18 LAB — HEPATITIS C ANTIBODY: HCV AB: NEGATIVE

## 2016-01-20 ENCOUNTER — Encounter: Payer: Self-pay | Admitting: Internal Medicine
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
## 2016-01-21 LAB — HIV-1 RNA ULTRAQUANT REFLEX TO GENTYP+
HIV 1 RNA Quant: 6270 copies/mL — ABNORMAL HIGH (ref <20)
HIV-1 RNA Quant, Log: 3.8 Log copies/mL — ABNORMAL HIGH (ref <1.30)

## 2016-01-24 LAB — HLA B*5701: HLA-B 5701 W/RFLX HLA-B HIGH: NEGATIVE

## 2016-01-27 LAB — HIV-1 GENOTYPR PLUS

## 2016-01-28 ENCOUNTER — Non-Acute Institutional Stay (SKILLED_NURSING_FACILITY): Payer: Medicaid Other | Admitting: Adult Health

## 2016-01-28 DIAGNOSIS — S82191S Other fracture of upper end of right tibia, sequela: Secondary | ICD-10-CM

## 2016-01-28 DIAGNOSIS — S82131S Displaced fracture of medial condyle of right tibia, sequela: Secondary | ICD-10-CM

## 2016-01-28 DIAGNOSIS — B20 Human immunodeficiency virus [HIV] disease: Secondary | ICD-10-CM | POA: Diagnosis not present

## 2016-01-28 DIAGNOSIS — R5381 Other malaise: Secondary | ICD-10-CM | POA: Diagnosis not present

## 2016-01-29 ENCOUNTER — Encounter (HOSPITAL_COMMUNITY): Payer: Self-pay | Admitting: *Deleted

## 2016-01-29 ENCOUNTER — Inpatient Hospital Stay (HOSPITAL_COMMUNITY)
Admission: EM | Admit: 2016-01-29 | Discharge: 2016-02-06 | DRG: 166 | Disposition: A | Discharge status: Rehabilitation Facility | Payer: Medicaid Other | Attending: Internal Medicine | Admitting: Internal Medicine

## 2016-01-29 ENCOUNTER — Emergency Department (HOSPITAL_COMMUNITY): Payer: Medicaid Other

## 2016-01-29 ENCOUNTER — Inpatient Hospital Stay (HOSPITAL_COMMUNITY): Payer: Medicaid Other

## 2016-01-29 DIAGNOSIS — R58 Hemorrhage, not elsewhere classified: Secondary | ICD-10-CM | POA: Diagnosis not present

## 2016-01-29 DIAGNOSIS — R571 Hypovolemic shock: Secondary | ICD-10-CM | POA: Diagnosis present

## 2016-01-29 DIAGNOSIS — K4090 Unilateral inguinal hernia, without obstruction or gangrene, not specified as recurrent: Secondary | ICD-10-CM | POA: Diagnosis present

## 2016-01-29 DIAGNOSIS — R7303 Prediabetes: Secondary | ICD-10-CM | POA: Insufficient documentation

## 2016-01-29 DIAGNOSIS — J9690 Respiratory failure, unspecified, unspecified whether with hypoxia or hypercapnia: Secondary | ICD-10-CM

## 2016-01-29 DIAGNOSIS — D696 Thrombocytopenia, unspecified: Secondary | ICD-10-CM | POA: Diagnosis present

## 2016-01-29 DIAGNOSIS — J9601 Acute respiratory failure with hypoxia: Secondary | ICD-10-CM | POA: Diagnosis present

## 2016-01-29 DIAGNOSIS — Z6841 Body Mass Index (BMI) 40.0 and over, adult: Secondary | ICD-10-CM | POA: Diagnosis not present

## 2016-01-29 DIAGNOSIS — G9341 Metabolic encephalopathy: Secondary | ICD-10-CM | POA: Diagnosis present

## 2016-01-29 DIAGNOSIS — E876 Hypokalemia: Secondary | ICD-10-CM | POA: Diagnosis present

## 2016-01-29 DIAGNOSIS — I2699 Other pulmonary embolism without acute cor pulmonale: Secondary | ICD-10-CM | POA: Insufficient documentation

## 2016-01-29 DIAGNOSIS — I8290 Acute embolism and thrombosis of unspecified vein: Secondary | ICD-10-CM | POA: Insufficient documentation

## 2016-01-29 DIAGNOSIS — Z7982 Long term (current) use of aspirin: Secondary | ICD-10-CM

## 2016-01-29 DIAGNOSIS — J90 Pleural effusion, not elsewhere classified: Secondary | ICD-10-CM | POA: Diagnosis not present

## 2016-01-29 DIAGNOSIS — B20 Human immunodeficiency virus [HIV] disease: Secondary | ICD-10-CM | POA: Diagnosis present

## 2016-01-29 DIAGNOSIS — I469 Cardiac arrest, cause unspecified: Secondary | ICD-10-CM

## 2016-01-29 DIAGNOSIS — E874 Mixed disorder of acid-base balance: Secondary | ICD-10-CM | POA: Diagnosis present

## 2016-01-29 DIAGNOSIS — N179 Acute kidney failure, unspecified: Secondary | ICD-10-CM | POA: Insufficient documentation

## 2016-01-29 DIAGNOSIS — K219 Gastro-esophageal reflux disease without esophagitis: Secondary | ICD-10-CM | POA: Diagnosis present

## 2016-01-29 DIAGNOSIS — D500 Iron deficiency anemia secondary to blood loss (chronic): Secondary | ICD-10-CM | POA: Insufficient documentation

## 2016-01-29 DIAGNOSIS — E872 Acidosis: Secondary | ICD-10-CM | POA: Insufficient documentation

## 2016-01-29 DIAGNOSIS — I468 Cardiac arrest due to other underlying condition: Secondary | ICD-10-CM | POA: Diagnosis present

## 2016-01-29 DIAGNOSIS — D62 Acute posthemorrhagic anemia: Secondary | ICD-10-CM | POA: Diagnosis not present

## 2016-01-29 DIAGNOSIS — R42 Dizziness and giddiness: Secondary | ICD-10-CM | POA: Insufficient documentation

## 2016-01-29 DIAGNOSIS — R0602 Shortness of breath: Secondary | ICD-10-CM

## 2016-01-29 DIAGNOSIS — J690 Pneumonitis due to inhalation of food and vomit: Secondary | ICD-10-CM | POA: Diagnosis present

## 2016-01-29 DIAGNOSIS — E8729 Other acidosis: Secondary | ICD-10-CM | POA: Insufficient documentation

## 2016-01-29 LAB — CBC WITH DIFFERENTIAL/PLATELET
BASOS ABS: 0.1 10*3/uL (ref 0.0–0.1)
BASOS PCT: 0 %
EOS ABS: 0.2 10*3/uL (ref 0.0–0.7)
Eosinophils Relative: 1 %
HEMATOCRIT: 37.9 % — AB (ref 39.0–52.0)
HEMOGLOBIN: 11.1 g/dL — AB (ref 13.0–17.0)
LYMPHS ABS: 9.7 10*3/uL — AB (ref 0.7–4.0)
LYMPHS PCT: 50 %
MCH: 24.7 pg — ABNORMAL LOW (ref 26.0–34.0)
MCHC: 29.3 g/dL — ABNORMAL LOW (ref 30.0–36.0)
MCV: 84.2 fL (ref 78.0–100.0)
MONO ABS: 1.4 10*3/uL — AB (ref 0.1–1.0)
MONOS PCT: 7 %
NEUTROS ABS: 8 10*3/uL — AB (ref 1.7–7.7)
Neutrophils Relative %: 41 %
Platelets: 100 10*3/uL — ABNORMAL LOW (ref 150–400)
RBC: 4.5 MIL/uL (ref 4.22–5.81)
RDW: 14.3 % (ref 11.5–15.5)
WBC: 19.3 10*3/uL — ABNORMAL HIGH (ref 4.0–10.5)

## 2016-01-29 LAB — COMPREHENSIVE METABOLIC PANEL
ALBUMIN: 2.7 g/dL — AB (ref 3.5–5.0)
ALT: 210 U/L — ABNORMAL HIGH (ref 17–63)
AST: 201 U/L — AB (ref 15–41)
Alkaline Phosphatase: 73 U/L (ref 38–126)
Anion gap: 25 — ABNORMAL HIGH (ref 5–15)
BILIRUBIN TOTAL: 0.5 mg/dL (ref 0.3–1.2)
BUN: 8 mg/dL (ref 6–20)
CO2: 14 mmol/L — AB (ref 22–32)
Calcium: 8.5 mg/dL — ABNORMAL LOW (ref 8.9–10.3)
Chloride: 102 mmol/L (ref 101–111)
Creatinine, Ser: 1.7 mg/dL — ABNORMAL HIGH (ref 0.61–1.24)
GFR calc Af Amer: 58 mL/min — ABNORMAL LOW (ref >60)
GFR calc non Af Amer: 50 mL/min — ABNORMAL LOW (ref >60)
GLUCOSE: 436 mg/dL — AB (ref 65–99)
POTASSIUM: 3.4 mmol/L — AB (ref 3.5–5.1)
SODIUM: 141 mmol/L (ref 135–145)
TOTAL PROTEIN: 6 g/dL — AB (ref 6.5–8.1)

## 2016-01-29 LAB — I-STAT CHEM 8, ED
BUN: 9 mg/dL (ref 6–20)
CALCIUM ION: 1.07 mmol/L — AB (ref 1.12–1.23)
CHLORIDE: 101 mmol/L (ref 101–111)
Creatinine, Ser: 1.3 mg/dL — ABNORMAL HIGH (ref 0.61–1.24)
Glucose, Bld: 444 mg/dL — ABNORMAL HIGH (ref 65–99)
HEMATOCRIT: 42 % (ref 39.0–52.0)
Hemoglobin: 14.3 g/dL (ref 13.0–17.0)
POTASSIUM: 3.4 mmol/L — AB (ref 3.5–5.1)
SODIUM: 139 mmol/L (ref 135–145)
TCO2: 16 mmol/L (ref 0–100)

## 2016-01-29 LAB — TRIGLYCERIDES: Triglycerides: 136 mg/dL (ref <150)

## 2016-01-29 LAB — I-STAT ARTERIAL BLOOD GAS, ED
ACID-BASE DEFICIT: 9 mmol/L — AB (ref 0.0–2.0)
Acid-base deficit: 24 mmol/L — ABNORMAL HIGH (ref 0.0–2.0)
BICARBONATE: 11.9 meq/L — AB (ref 20.0–24.0)
BICARBONATE: 18.3 meq/L — AB (ref 20.0–24.0)
O2 Saturation: 93 %
O2 Saturation: 94 %
PCO2 ART: 81.4 mmHg — AB (ref 35.0–45.0)
PO2 ART: 77 mmHg — AB (ref 80.0–100.0)
Patient temperature: 96.7
Patient temperature: 96.7
TCO2: 15 mmol/L (ref 0–100)
TCO2: 20 mmol/L (ref 0–100)
pCO2 arterial: 42.2 mmHg (ref 35.0–45.0)
pH, Arterial: 6.765 — CL (ref 7.350–7.450)
pH, Arterial: 7.239 — ABNORMAL LOW (ref 7.350–7.450)
pO2, Arterial: 128 mmHg — ABNORMAL HIGH (ref 80.0–100.0)

## 2016-01-29 LAB — PROTIME-INR
INR: 1.97 — ABNORMAL HIGH (ref 0.00–1.49)
Prothrombin Time: 22.3 seconds — ABNORMAL HIGH (ref 11.6–15.2)

## 2016-01-29 LAB — CBG MONITORING, ED: GLUCOSE-CAPILLARY: 165 mg/dL — AB (ref 65–99)

## 2016-01-29 LAB — I-STAT TROPONIN, ED: TROPONIN I, POC: 0.1 ng/mL — AB (ref 0.00–0.08)

## 2016-01-29 LAB — I-STAT CG4 LACTIC ACID, ED: Lactic Acid, Venous: 16.73 mmol/L (ref 0.5–2.0)

## 2016-01-29 LAB — BRAIN NATRIURETIC PEPTIDE: B NATRIURETIC PEPTIDE 5: 19.1 pg/mL (ref 0.0–100.0)

## 2016-01-29 LAB — APTT: aPTT: 62 seconds — ABNORMAL HIGH (ref 24–37)

## 2016-01-29 LAB — MAGNESIUM: Magnesium: 2.5 mg/dL — ABNORMAL HIGH (ref 1.7–2.4)

## 2016-01-29 LAB — PHOSPHORUS: Phosphorus: 12 mg/dL — ABNORMAL HIGH (ref 2.5–4.6)

## 2016-01-29 MED ORDER — SODIUM BICARBONATE 8.4 % IV SOLN
50.0000 meq | Freq: Once | INTRAVENOUS | Status: AC
  Administered 2016-01-29 (×2): 50 meq via INTRAVENOUS
  Filled 2016-01-29: qty 100

## 2016-01-29 MED ORDER — NOREPINEPHRINE BITARTRATE 1 MG/ML IV SOLN
0.0000 ug/min | INTRAVENOUS | Status: DC
  Filled 2016-01-29: qty 4

## 2016-01-29 MED ORDER — FENTANYL BOLUS VIA INFUSION
50.0000 ug | INTRAVENOUS | Status: DC | PRN
  Filled 2016-01-29: qty 50

## 2016-01-29 MED ORDER — SODIUM CHLORIDE 0.9 % IV SOLN
1.0000 g | Freq: Once | INTRAVENOUS | Status: AC
  Administered 2016-01-30: 1 g via INTRAVENOUS
  Filled 2016-01-29: qty 10

## 2016-01-29 MED ORDER — TENECTEPLASE 50 MG IV KIT
50.0000 mg | PACK | Freq: Once | INTRAVENOUS | Status: AC
  Administered 2016-01-29: 50 mg via INTRAVENOUS
  Filled 2016-01-29: qty 10

## 2016-01-29 MED ORDER — FENTANYL CITRATE (PF) 100 MCG/2ML IJ SOLN
100.0000 ug | Freq: Once | INTRAMUSCULAR | Status: AC
Start: 2016-01-29 — End: 2016-01-29
  Administered 2016-01-29: 100 ug via INTRAVENOUS

## 2016-01-29 MED ORDER — CISATRACURIUM BOLUS VIA INFUSION
0.0500 mg/kg | INTRAVENOUS | Status: DC | PRN
  Filled 2016-01-29: qty 8

## 2016-01-29 MED ORDER — SODIUM CHLORIDE 0.9 % IV SOLN
1.0000 ug/kg/min | INTRAVENOUS | Status: DC
  Filled 2016-01-29: qty 20

## 2016-01-29 MED ORDER — NALOXONE HCL 2 MG/2ML IJ SOSY
PREFILLED_SYRINGE | INTRAMUSCULAR | Status: AC
  Filled 2016-01-29: qty 2

## 2016-01-29 MED ORDER — SODIUM CHLORIDE 0.9 % IV SOLN
INTRAVENOUS | Status: DC
  Administered 2016-01-30: 01:00:00 via INTRAVENOUS

## 2016-01-29 MED ORDER — NALOXONE HCL 2 MG/2ML IJ SOSY
2.0000 mg/h | PREFILLED_SYRINGE | INTRAVENOUS | Status: DC
  Administered 2016-01-29: 2 mg/h via INTRAVENOUS
  Filled 2016-01-29: qty 4

## 2016-01-29 MED ORDER — PROPOFOL 1000 MG/100ML IV EMUL
5.0000 ug/kg/min | INTRAVENOUS | Status: DC
  Administered 2016-01-29: 10 ug/kg/min via INTRAVENOUS
  Filled 2016-01-29: qty 100

## 2016-01-29 MED ORDER — MIDAZOLAM HCL 2 MG/2ML IJ SOLN
2.0000 mg | Freq: Once | INTRAMUSCULAR | Status: AC
  Administered 2016-01-29: 2 mg via INTRAVENOUS

## 2016-01-29 MED ORDER — ROCURONIUM BROMIDE 50 MG/5ML IV SOLN
INTRAVENOUS | Status: AC | PRN
  Administered 2016-01-29: 80 mg via INTRAVENOUS

## 2016-01-29 MED ORDER — ARTIFICIAL TEARS OP OINT
1.0000 "application " | TOPICAL_OINTMENT | Freq: Three times a day (TID) | OPHTHALMIC | Status: DC
  Filled 2016-01-29: qty 3.5

## 2016-01-29 MED ORDER — MIDAZOLAM HCL 5 MG/ML IJ SOLN
1.0000 mg/h | INTRAMUSCULAR | Status: DC
  Filled 2016-01-29: qty 10

## 2016-01-29 MED ORDER — CISATRACURIUM BOLUS VIA INFUSION
0.1000 mg/kg | Freq: Once | INTRAVENOUS | Status: DC
Start: 2016-01-29 — End: 2016-01-30
  Filled 2016-01-29: qty 16

## 2016-01-29 MED ORDER — PANTOPRAZOLE SODIUM 40 MG IV SOLR
40.0000 mg | Freq: Every day | INTRAVENOUS | Status: DC
  Administered 2016-01-30 (×2): 40 mg via INTRAVENOUS
  Filled 2016-01-29 (×2): qty 40

## 2016-01-29 MED ORDER — MIDAZOLAM BOLUS VIA INFUSION
2.0000 mg | INTRAVENOUS | Status: DC | PRN
  Filled 2016-01-29: qty 2

## 2016-01-29 MED ORDER — IOHEXOL 350 MG/ML SOLN
100.0000 mL | Freq: Once | INTRAVENOUS | Status: AC | PRN
  Administered 2016-01-29: 100 mL via INTRAVENOUS

## 2016-01-29 MED ORDER — EPINEPHRINE HCL 0.1 MG/ML IJ SOSY
PREFILLED_SYRINGE | INTRAMUSCULAR | Status: DC | PRN
  Administered 2016-01-29 (×2): 1 mg via INTRAVENOUS

## 2016-01-29 MED ORDER — SODIUM CHLORIDE 0.9 % IV SOLN
25.0000 ug/h | INTRAVENOUS | Status: DC
  Filled 2016-01-29: qty 50

## 2016-01-29 MED ORDER — NALOXONE HCL 2 MG/2ML IJ SOSY
PREFILLED_SYRINGE | INTRAMUSCULAR | Status: DC | PRN
Start: 2016-01-29 — End: 2016-01-29
  Administered 2016-01-29 (×4): 2 mg via INTRAVENOUS

## 2016-01-29 MED ORDER — MIDAZOLAM HCL 2 MG/2ML IJ SOLN
INTRAMUSCULAR | Status: AC
  Administered 2016-01-29: 2 mg via INTRAVENOUS
  Filled 2016-01-29: qty 2

## 2016-01-29 MED ORDER — SODIUM CHLORIDE 0.9 % IV SOLN
2000.0000 mL | Freq: Once | INTRAVENOUS | Status: AC
  Administered 2016-01-29: 2000 mL via INTRAVENOUS

## 2016-01-29 MED ORDER — ASPIRIN 300 MG RE SUPP: 300.0000 mg | RECTAL | Status: DC

## 2016-01-29 MED ORDER — EPINEPHRINE HCL 0.1 MG/ML IJ SOSY
PREFILLED_SYRINGE | INTRAMUSCULAR | Status: DC | PRN
  Administered 2016-01-29: 1 mg via INTRAVENOUS

## 2016-01-29 MED ORDER — INSULIN ASPART 100 UNIT/ML ~~LOC~~ SOLN: 2.0000 [IU] | SUBCUTANEOUS | Status: DC

## 2016-01-29 MED ORDER — FENTANYL CITRATE (PF) 100 MCG/2ML IJ SOLN
INTRAMUSCULAR | Status: AC
  Administered 2016-01-29: 100 ug via INTRAVENOUS
  Filled 2016-01-29: qty 2

## 2016-01-29 MED ORDER — ETOMIDATE 2 MG/ML IV SOLN
INTRAVENOUS | Status: AC | PRN
  Administered 2016-01-29: 20 mg via INTRAVENOUS
  Administered 2016-01-29: 25 mg via INTRAVENOUS

## 2016-01-29 NOTE — ED Notes (Signed)
See free text note

## 2016-01-29 NOTE — Progress Notes (Signed)
Pt transferred to 2H1. Pt was transported on 100% uneventful trip pt is stable at this time RN at bedside

## 2016-01-29 NOTE — Code Documentation (Signed)
Pulse check - compressions stopped

## 2016-01-29 NOTE — ED Notes (Signed)
Critical Care PA bedside with resident.

## 2016-01-29 NOTE — ED Notes (Signed)
ETCO2 set up.

## 2016-01-29 NOTE — Progress Notes (Signed)
EMS On arrival noted severe SOB and pt huffing and trying to talk. Pt was alert but only limited orientation. Pt was able to state his name, very hard to make it out due to his SOB and him stating "cant breath". Shortly after pt presented agonal respirations with bradycardia and eventually soon thereafter pt had no pulse. CPR initiated. Prepped to set up for Intubation at this time.

## 2016-01-29 NOTE — Progress Notes (Signed)
eLink Physician-Brief Progress Note Patient Name: Todd Parrish DOB: 1981-09-13 MRN: [id number]   Date of Service  01/29/2016  HPI/Events of Note  S/p cardiac arrest-patient was  To be started on CODe Cool. However, nurse notes following commands. Patient was able to wiggle toes on command, upper ext seem to be weak, had attempted to gives thumbs up sign  eICU Interventions  ICU team notified-will re-assess neuro status and consider stopping code cool.        Erin Fulling 01/29/2016, 11:34 PM

## 2016-01-29 NOTE — ED Notes (Addendum)
Patient arrived via EMS from [facility] on [address].  Patient was being discharged from the rehab there and had 3 syncopal episodes while in the car leaving.  EMS was called and transferred him in to the ambulance and while enroute patient was talking and then had 2 seizure like activity episodes with right gaze and sudden SOB.  Upon arrival to the ED patient started with agonal breathing.  EMS reported they paced him for approx 15 mins due to becoming bradycardic. Versed 1/2mg  given by EMS sats 90%  CBG 180 EMS BP 140/98 initial rhythm ST

## 2016-01-29 NOTE — ED Notes (Signed)
Pants cut and full of stool.  Place in trash can.  Shoes, Advertising account planner, gift card, $41.00, notebook  NO CELL PHONE FOUND

## 2016-01-29 NOTE — Progress Notes (Signed)
abg results given to MD covering this case. MD aware of Marked Acidosis
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
## 2016-01-29 NOTE — Code Documentation (Signed)
Compressions started.

## 2016-01-29 NOTE — Progress Notes (Signed)
abg collected  

## 2016-01-29 NOTE — ED Notes (Signed)
Unable to insert urinary cath due to large scrotal hernia.  MD aware.  Patient cleaned up of large amount yellow stool.

## 2016-01-29 NOTE — ED Notes (Addendum)
TNK  via right AC over 5 seconds

## 2016-01-29 NOTE — Procedures (Signed)
Intubation Procedure Note Todd Parrish [id number] 1981-10-16  Procedure: Intubation Indications: Airway protection and maintenance  Procedure Details Consent: Unable to obtain consent because of emergent medical necessity. Time Out: Verified patient identification, verified procedure, site/side was marked, verified correct patient position, special equipment/implants available, medications/allergies/relevent history reviewed, required imaging and test results available.  Performed  Maximum sterile technique was used including gloves, hand hygiene and mask.  MAC and 3    Evaluation Hemodynamic Status: BP stable throughout; O2 sats: stable throughout Patient's Current Condition: stable Complications: No apparent complications Patient did tolerate procedure well. Chest X-ray ordered to verify placement.  CXR: pending   Pt was intubated per ED MD. 7.5 ETT 26lip. Pt had positive CO2 color change and bilateral breath sounds.   Todd Parrish 01/29/2016
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
## 2016-01-29 NOTE — Progress Notes (Signed)
abg results given to PA
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
## 2016-01-29 NOTE — ED Notes (Signed)
Heart rate 93 - compression stopped

## 2016-01-29 NOTE — ED Notes (Signed)
Patients heart rate in the 40's compression initiated

## 2016-01-29 NOTE — ED Notes (Signed)
CBG-[id number] 

## 2016-01-29 NOTE — ED Notes (Signed)
Patient with agonal resp and compressions started

## 2016-01-30 ENCOUNTER — Inpatient Hospital Stay (HOSPITAL_COMMUNITY): Payer: Medicaid Other

## 2016-01-30 DIAGNOSIS — E8729 Other acidosis: Secondary | ICD-10-CM | POA: Insufficient documentation

## 2016-01-30 DIAGNOSIS — I469 Cardiac arrest, cause unspecified: Secondary | ICD-10-CM | POA: Insufficient documentation

## 2016-01-30 DIAGNOSIS — J9600 Acute respiratory failure, unspecified whether with hypoxia or hypercapnia: Secondary | ICD-10-CM

## 2016-01-30 DIAGNOSIS — J9690 Respiratory failure, unspecified, unspecified whether with hypoxia or hypercapnia: Secondary | ICD-10-CM | POA: Insufficient documentation

## 2016-01-30 DIAGNOSIS — N179 Acute kidney failure, unspecified: Secondary | ICD-10-CM

## 2016-01-30 DIAGNOSIS — R0602 Shortness of breath: Secondary | ICD-10-CM

## 2016-01-30 DIAGNOSIS — I2699 Other pulmonary embolism without acute cor pulmonale: Secondary | ICD-10-CM | POA: Insufficient documentation

## 2016-01-30 DIAGNOSIS — I2609 Other pulmonary embolism with acute cor pulmonale: Secondary | ICD-10-CM

## 2016-01-30 DIAGNOSIS — E872 Acidosis: Secondary | ICD-10-CM

## 2016-01-30 LAB — LACTIC ACID, PLASMA
LACTIC ACID, VENOUS: 5.8 mmol/L — AB (ref 0.5–2.0)
Lactic Acid, Venous: 7.5 mmol/L (ref 0.5–2.0)

## 2016-01-30 LAB — BASIC METABOLIC PANEL
ANION GAP: 17 — AB (ref 5–15)
BUN: 10 mg/dL (ref 6–20)
CALCIUM: 6.7 mg/dL — AB (ref 8.9–10.3)
CO2: 16 mmol/L — ABNORMAL LOW (ref 22–32)
Chloride: 110 mmol/L (ref 101–111)
Creatinine, Ser: 1.69 mg/dL — ABNORMAL HIGH (ref 0.61–1.24)
GFR calc Af Amer: 59 mL/min — ABNORMAL LOW (ref >60)
GFR, EST NON AFRICAN AMERICAN: 51 mL/min — AB (ref >60)
GLUCOSE: 243 mg/dL — AB (ref 65–99)
POTASSIUM: 3.5 mmol/L (ref 3.5–5.1)
SODIUM: 143 mmol/L (ref 135–145)

## 2016-01-30 LAB — HEPARIN LEVEL (UNFRACTIONATED)
HEPARIN UNFRACTIONATED: 0.2 [IU]/mL — AB (ref 0.30–0.70)
Heparin Unfractionated: 0.38 IU/mL (ref 0.30–0.70)

## 2016-01-30 LAB — POCT I-STAT, CHEM 8
BUN: 10 mg/dL (ref 6–20)
Calcium, Ion: 0.94 mmol/L — ABNORMAL LOW (ref 1.12–1.23)
Chloride: 106 mmol/L (ref 101–111)
Creatinine, Ser: 1.3 mg/dL — ABNORMAL HIGH (ref 0.61–1.24)
Glucose, Bld: 234 mg/dL — ABNORMAL HIGH (ref 65–99)
HCT: 40 % (ref 39.0–52.0)
HEMOGLOBIN: 13.6 g/dL (ref 13.0–17.0)
POTASSIUM: 3.3 mmol/L — AB (ref 3.5–5.1)
Sodium: 145 mmol/L (ref 135–145)
TCO2: 19 mmol/L (ref 0–100)

## 2016-01-30 LAB — CBC
HEMATOCRIT: 37.1 % — AB (ref 39.0–52.0)
HEMOGLOBIN: 11.1 g/dL — AB (ref 13.0–17.0)
MCH: 24 pg — AB (ref 26.0–34.0)
MCHC: 29.9 g/dL — ABNORMAL LOW (ref 30.0–36.0)
MCV: 80.3 fL (ref 78.0–100.0)
Platelets: 141 10*3/uL — ABNORMAL LOW (ref 150–400)
RBC: 4.62 MIL/uL (ref 4.22–5.81)
RDW: 14 % (ref 11.5–15.5)
WBC: 14.6 10*3/uL — ABNORMAL HIGH (ref 4.0–10.5)

## 2016-01-30 LAB — MAGNESIUM: MAGNESIUM: 1.7 mg/dL (ref 1.7–2.4)

## 2016-01-30 LAB — PROTIME-INR
INR: 2.31 — AB (ref 0.00–1.49)
Prothrombin Time: 25.2 seconds — ABNORMAL HIGH (ref 11.6–15.2)

## 2016-01-30 LAB — TROPONIN I: Troponin I: 3.96 ng/mL (ref <0.031)

## 2016-01-30 LAB — GLUCOSE, CAPILLARY
Glucose-Capillary: 97 mg/dL (ref 65–99)
Glucose-Capillary: 96 mg/dL (ref 65–99)

## 2016-01-30 LAB — PHOSPHORUS: Phosphorus: 6.5 mg/dL — ABNORMAL HIGH (ref 2.5–4.6)

## 2016-01-30 LAB — PROCALCITONIN: PROCALCITONIN: 7.74 ng/mL

## 2016-01-30 MED ORDER — INSULIN ASPART 100 UNIT/ML ~~LOC~~ SOLN: 0.0000 [IU] | SUBCUTANEOUS | Status: DC

## 2016-01-30 MED ORDER — ASPIRIN 325 MG PO TABS
325.0000 mg | ORAL_TABLET | ORAL | Status: AC
  Administered 2016-01-30: 325 mg
  Filled 2016-01-30: qty 1

## 2016-01-30 MED ORDER — PERFLUTREN LIPID MICROSPHERE
INTRAVENOUS | Status: AC
  Administered 2016-01-30: 11:00:00
  Filled 2016-01-30: qty 10

## 2016-01-30 MED ORDER — SODIUM CHLORIDE 0.9 % IV SOLN
3.0000 g | Freq: Four times a day (QID) | INTRAVENOUS | Status: DC
  Administered 2016-01-30 – 2016-02-01 (×8): 3 g via INTRAVENOUS
  Filled 2016-01-30 (×12): qty 3

## 2016-01-30 MED ORDER — ONDANSETRON HCL 4 MG/2ML IJ SOLN
4.0000 mg | Freq: Four times a day (QID) | INTRAMUSCULAR | Status: DC | PRN
  Administered 2016-01-30: 4 mg via INTRAVENOUS
  Filled 2016-01-30: qty 2

## 2016-01-30 MED ORDER — POTASSIUM CHLORIDE 20 MEQ/15ML (10%) PO SOLN
40.0000 meq | Freq: Once | ORAL | Status: AC
  Administered 2016-01-30: 40 meq
  Filled 2016-01-30: qty 30

## 2016-01-30 MED ORDER — FENTANYL CITRATE (PF) 100 MCG/2ML IJ SOLN
100.0000 ug | INTRAMUSCULAR | Status: DC | PRN
  Administered 2016-01-30 (×2): 100 ug via INTRAVENOUS
  Filled 2016-01-30 (×3): qty 2

## 2016-01-30 MED ORDER — HEPARIN (PORCINE) IN NACL 100-0.45 UNIT/ML-% IJ SOLN
1400.0000 [IU]/h | INTRAMUSCULAR | Status: DC
  Administered 2016-01-30: 1000 [IU]/h via INTRAVENOUS
  Administered 2016-01-31 (×2): 1250 [IU]/h via INTRAVENOUS
  Filled 2016-01-30 (×3): qty 250

## 2016-01-30 MED ORDER — FENTANYL CITRATE (PF) 100 MCG/2ML IJ SOLN
100.0000 ug | INTRAMUSCULAR | Status: DC | PRN
  Administered 2016-01-31: 50 ug via INTRAVENOUS

## 2016-01-30 MED ORDER — MIDAZOLAM HCL 2 MG/2ML IJ SOLN
2.0000 mg | INTRAMUSCULAR | Status: DC | PRN
  Administered 2016-01-30 (×2): 2 mg via INTRAVENOUS
  Filled 2016-01-30 (×4): qty 2

## 2016-01-30 MED ORDER — ANTISEPTIC ORAL RINSE SOLUTION (CORINZ)
7.0000 mL | OROMUCOSAL | Status: DC
  Administered 2016-01-30 (×7): 7 mL via OROMUCOSAL

## 2016-01-30 MED ORDER — CHOLESTYRAMINE LIGHT 4 G PO PACK
4.0000 g | PACK | Freq: Three times a day (TID) | ORAL | Status: DC
  Administered 2016-01-30 – 2016-02-03 (×10): 4 g via ORAL
  Filled 2016-01-30 (×16): qty 1

## 2016-01-30 MED ORDER — MIDAZOLAM HCL 2 MG/2ML IJ SOLN
2.0000 mg | INTRAMUSCULAR | Status: DC | PRN
  Administered 2016-01-30: 2 mg via INTRAVENOUS

## 2016-01-30 MED ORDER — CETYLPYRIDINIUM CHLORIDE 0.05 % MT LIQD
7.0000 mL | Freq: Two times a day (BID) | OROMUCOSAL | Status: DC
  Administered 2016-01-30 – 2016-02-06 (×9): 7 mL via OROMUCOSAL

## 2016-01-30 MED ORDER — SODIUM CHLORIDE 0.9 % IV BOLUS (SEPSIS)
1000.0000 mL | Freq: Once | INTRAVENOUS | Status: AC
  Administered 2016-01-30: 1000 mL via INTRAVENOUS

## 2016-01-30 MED ORDER — CHLORHEXIDINE GLUCONATE 0.12% ORAL RINSE (MEDLINE KIT)
15.0000 mL | Freq: Two times a day (BID) | OROMUCOSAL | Status: DC
  Administered 2016-01-30 (×3): 15 mL via OROMUCOSAL

## 2016-01-30 MED FILL — Medication: Qty: 1 | Status: AC

## 2016-01-30 NOTE — Progress Notes (Signed)
VASCULAR LAB PRELIMINARY  PRELIMINARY  PRELIMINARY  PRELIMINARY  Bilateral lower extremity venous duplex  completed.    Preliminary report:  Bilateral:  No obvious evidence of DVT, superficial thrombosis, or Baker's Cyst.  Technically limited by body habitus.   Elliotte Marsalis, RVT 01/30/2016, 11:44 AM

## 2016-01-30 NOTE — Progress Notes (Signed)
Dr. Belia Heman called about lab results and labs needed in am, Patient cannot get lab sticks due to order for 24 hours, RN sometimes able to get blood off peripheral IVs with difficulty, Dr. Belia Heman notified, no new orders at this time.

## 2016-01-30 NOTE — Progress Notes (Signed)
ANTICOAGULATION CONSULT NOTE  Pharmacy Consult for heparin Indication: pulmonary embolus  Allergies  Allergen Reactions  . Morphine And Related Nausea And Vomiting    Elevated blood pressure    Patient Measurements: Height:  (170.2 cm) Weight: (!) 340 lb 2.7 oz (154.3 kg) IBW/kg (Calculated) : 66.1 Heparin Dosing Weight: 105kg  Vital Signs: Temp: 98.3 F (36.8 C) (02/09 2000) Temp Source: Oral (02/09 2000) BP: 145/79 mmHg (02/09 2200) Pulse Rate: 98 (02/09 2200)  Labs:  Recent Labs  01/29/16 2020 01/29/16 2024 01/30/16 0020 01/30/16 0022 01/30/16 0320 01/30/16 1200 01/30/16 2307  HGB 11.1* 14.3 11.1* 13.6  --   --   --   HCT 37.9* 42.0 37.1* 40.0  --   --   --   PLT 100*  --  141*  --   --   --   --   APTT 62*  --   --   --   --   --   --   LABPROT 22.3*  --   --   --  25.2*  --   --   INR 1.97*  --   --   --  2.31*  --   --   HEPARINUNFRC  --   --   --   --   --  0.20* 0.38  CREATININE 1.70* 1.30* 1.69* 1.30*  --   --   --   TROPONINI  --   --  3.96*  --   --   --   --     Estimated Creatinine Clearance: 112.7 mL/min (by C-G formula based on Cr of 1.3).    Scheduled:  . ampicillin-sulbactam (UNASYN) IV  3 g Intravenous 4 times per day  . antiseptic oral rinse  7 mL Mouth Rinse BID  . cholestyramine light  4 g Oral TID  . insulin aspart  0-9 Units Subcutaneous 6 times per day  . pantoprazole (PROTONIX) IV  40 mg Intravenous QHS   Infusions:  . sodium chloride 10 mL/hr at 01/30/16 1930  . heparin 1,250 Units/hr (01/30/16 1930)    Assessment: 34yo male had ortho surgery and was to be released from rehab facility when pt began c/o SOB and had syncopal event, rehab facility called EMS, pt coded when arrived to ED. Patient received TNKase on 2/8 at 8pm for PE with right heart strain. Heparin level 0.38 (therapeutic) on 1250 units/hr. Goal now back to range of 0.3-0.7 as 24hr post TNKase. No bleeding noted.  Goal of Therapy:  Heparin level 0.3-0.7  units/ml Monitor platelets by anticoagulation protocol: Yes   Plan:  -Continue heparin at 1250 units/hr -Heparin level and CBC daily  Christoper Fabian, PharmD, BCPS Clinical pharmacist, pager [phone]  01/30/2016 11:33 PM

## 2016-01-30 NOTE — Progress Notes (Signed)
Lactic 5.8 called to elink nurse Mark.

## 2016-01-30 NOTE — Progress Notes (Signed)
Echocardiogram 2D Echocardiogram with Definity has been performed.  Nolon Rod 01/30/2016, 12:31 PM

## 2016-01-30 NOTE — Progress Notes (Signed)
ANTICOAGULATION CONSULT NOTE  Pharmacy Consult for heparin Indication: pulmonary embolus  Allergies  Allergen Reactions  . Morphine And Related Nausea And Vomiting    Elevated blood pressure    Patient Measurements: Height:  (170.2 cm) Weight: (!) 340 lb 2.7 oz (154.3 kg) IBW/kg (Calculated) : 66.1 Heparin Dosing Weight: 105kg  Vital Signs: Temp: 98.3 F (36.8 C) (02/09 1140) Temp Source: Oral (02/09 1140) BP: 94/58 mmHg (02/09 0900) Pulse Rate: 112 (02/09 0900)  Labs:  Recent Labs  01/29/16 2020 01/29/16 2024 01/30/16 0020 01/30/16 0022 01/30/16 0320 01/30/16 1200  HGB 11.1* 14.3 11.1* 13.6  --   --   HCT 37.9* 42.0 37.1* 40.0  --   --   PLT 100*  --  141*  --   --   --   APTT 62*  --   --   --   --   --   LABPROT 22.3*  --   --   --  25.2*  --   INR 1.97*  --   --   --  2.31*  --   HEPARINUNFRC  --   --   --   --   --  0.20*  CREATININE 1.70* 1.30* 1.69* 1.30*  --   --   TROPONINI  --   --  3.96*  --   --   --     Estimated Creatinine Clearance: 112.7 mL/min (by C-G formula based on Cr of 1.3).    Scheduled:  . ampicillin-sulbactam (UNASYN) IV  3 g Intravenous 4 times per day  . antiseptic oral rinse  7 mL Mouth Rinse 10 times per day  . chlorhexidine gluconate  15 mL Mouth Rinse BID  . insulin aspart  0-9 Units Subcutaneous 6 times per day  . pantoprazole (PROTONIX) IV  40 mg Intravenous QHS   Infusions:  . sodium chloride 50 mL/hr at 01/30/16 0630  . heparin 1,000 Units/hr (01/30/16 0058)    Assessment: 34yo male had ortho surgery and was to be released from rehab facility when pt began c/o SOB and had syncopal event, rehab facility called EMS, pt coded when arrived to ED. Patient was suspected with PE now s/p TNKase on 2/8 at 8pm (CT confirmed PE w/ RHS).  -Initial heparin level is 0.2 at 1000 units/hr. Hg= 13.6, plt= 141  Goal of Therapy:  Heparin level 0.3-0.5 units/ml x24h then heparin level 0.3-0.5 units/ml Monitor platelets by  anticoagulation protocol: Yes   Plan:  -Increase heparin to 1250 units/hr -Heparin level in 6 hours and daily wth CBC daily  Harland German, Pharm D 01/30/2016 1:12 PM
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
## 2016-01-30 NOTE — Progress Notes (Signed)
PULMONARY / CRITICAL CARE MEDICINE   Name: Todd Parrish MRN: [id number] DOB: 1981/02/11    ADMISSION DATE:  01/29/2016 CONSULTATION DATE:  01/29/16  REFERRING MD:  EDP  CHIEF COMPLAINT:  Cardiac Arrest  SUBJECTIVE:   Multiple events: 1.  Chest CTA positive for PE. 2.  After arrival to ICU, pt opened eyes and wiggled toes and arms.  He was able to follow all commands and is shaking head appropriately in response to questions.  Hypothermia protocol therefore discontinued.  VITAL SIGNS: BP 118/73 mmHg  Pulse 117  Resp 30  Ht 5\' 7"  (1.702 m)  Wt 154.2 kg (339 lb 15.2 oz)  BMI 53.23 kg/m2  SpO2 98%  HEMODYNAMICS:    VENTILATOR SETTINGS: Vent Mode:  [-] PRVC FiO2 (%):  [60 %-70 %] 70 % Set Rate:  [30 bmp] 30 bmp Vt Set:  [600 mL] 600 mL PEEP:  [5 cmH20-8 cmH20] 8 cmH20 Plateau Pressure:  [25 cmH20-28 cmH20] 28 cmH20  INTAKE / OUTPUT:     PHYSICAL EXAMINATION: General: Young AA male, in NAD. Neuro: Somnolent but easily arouseable to voice.  Follows basic commands and shakes head appropriately in response to questions. HEENT: [place]/AT. PERRL, sclerae anicteric. Cardiovascular: RRR, no M/R/G.  Lungs: Respirations even and unlabored.  Coarse bilaterally. Abdomen: Obese. BS x 4, soft, NT/ND.  Urologic: Large hernia protruding into scrotum. Musculoskeletal: No gross deformities, 1+ BLE edema Skin: Intact, warm, no rashes.  LABS:  BMET  Recent Labs Lab 01/29/16 2020 01/29/16 2024  NA 141 139  K 3.4* 3.4*  CL 102 101  CO2 14*  --   BUN 8 9  CREATININE 1.70* 1.30*  GLUCOSE 436* 444*    Electrolytes  Recent Labs Lab 01/29/16 2020  CALCIUM 8.5*  MG 2.5*  PHOS 12.0*    CBC  Recent Labs Lab 01/29/16 2020 01/29/16 2024  WBC 19.3*  --   HGB 11.1* 14.3  HCT 37.9* 42.0  PLT 100*  --     Coag's  Recent Labs Lab 01/29/16 2020  APTT 62*  INR 1.97*    Sepsis Markers  Recent Labs Lab 01/29/16 2024  LATICACIDVEN 16.73*     ABG  Recent Labs Lab 01/29/16 2023 01/29/16 2200  PHART 6.765* 7.239*  PCO2ART 81.4* 42.2  PO2ART 128.0* 77.0*    Liver Enzymes  Recent Labs Lab 01/29/16 2020  AST 201*  ALT 210*  ALKPHOS 73  BILITOT 0.5  ALBUMIN 2.7*    Cardiac Enzymes No results for input(s): TROPONINI, PROBNP in the last 168 hours.  Glucose  Recent Labs Lab 01/29/16 1947  GLUCAP 165*    Imaging Ct Head Wo Contrast  01/29/2016  CLINICAL DATA:  Continued surveillance cardiac arrest. Three syncopal episodes and 2 seizure-like episodes EXAM: CT HEAD WITHOUT CONTRAST TECHNIQUE: Contiguous axial images were obtained from the base of the skull through the vertex without intravenous contrast. COMPARISON:  None. FINDINGS: No evidence for acute infarction, hemorrhage, mass lesion, hydrocephalus, or extra-axial fluid. Normal cerebral volume. No white matter disease. Gray-white junction preserved throughout. Calvarium intact. Mild sinus fluid. Negative mastoids. Unremarkable orbits. IMPRESSION: Negative exam.  No intracranial mass lesion or acute findings. Electronically Signed   By: Elsie Stain M.D.   On: 01/29/2016 23:19   Ct Angio Chest Pe W/cm &/or Wo Cm  01/29/2016  CLINICAL DATA:  Status post cardiac arrest.  Initial encounter. EXAM: CT ANGIOGRAPHY CHEST WITH CONTRAST TECHNIQUE: Multidetector CT imaging of the chest was performed using the standard protocol during  bolus administration of intravenous contrast. Multiplanar CT image reconstructions and MIPs were obtained to evaluate the vascular anatomy. CONTRAST:  OMNIPAQUE IOHEXOL 350 MG/ML SOLN COMPARISON:  Chest radiograph performed earlier today at 8:41 p.m. FINDINGS: There appears to be pulmonary embolus within the pulmonary artery to the left lower lobe. An RV/LV ratio of 1.6 raises concern for right heart strain and submassive pulmonary embolus. Patchy airspace consolidation is noted about the right hilum and at both lung bases. This may reflect  multifocal pneumonia, though post-code atelectasis might have a similar appearance. There is no evidence of pleural effusion or pneumothorax. No masses are identified; no abnormal focal contrast enhancement is seen. The patient's endotracheal tube balloon appears mildly overinflated. The endotracheal tube is seen ending 2 cm above the carina. No definite mediastinal lymphadenopathy is seen, though evaluation is somewhat suboptimal due to the patient's habitus and enteric tube. No pericardial effusion is identified. No axillary lymphadenopathy is seen. The visualized portions of the thyroid gland are unremarkable in appearance. There is reflux of contrast into the hepatic veins and IVC. A small amount of ascites is noted. Evaluation is somewhat suboptimal due to the patient's habitus, but the left hepatic lobe appears to drape over the spleen. Vague mild soft tissue inflammation is suggested about the visualized pancreas, and mild pancreatitis cannot be excluded. Would correlate with the patient's lab values. No acute osseous abnormalities are seen. Review of the MIP images confirms the above findings. IMPRESSION: 1. Pulmonary embolus within the pulmonary artery to the left lower lobe. CT evidence of right heart strain (RV/LV Ratio = 1.6) consistent with at least submassive (intermediate risk) PE. The presence of right heart strain has been associated with an increased risk of morbidity and mortality. Please activate Code PE by paging [phone]. 2. Patchy airspace consolidation about the right hilum and at both lung bases. This may reflect multifocal pneumonia, though post-code atelectasis might have a similar appearance. 3. Endotracheal tube balloon appears mildly overinflated. This could be slightly deflated, as deemed clinically appropriate. 4. Small amount of ascites noted. Vague soft tissue inflammation is suggested about the visualized pancreas. Mild pancreatitis cannot be excluded, though this may simply be  artifactual. Would correlate with lab values. 5. Reflux of contrast into the hepatic veins and IVC. Critical Value/emergent results were called by telephone at the time of interpretation on 01/29/2016 at 11:34 pm to Dr. Esperanza Richters, who verbally acknowledged these results. Electronically Signed   By: Roanna Raider M.D.   On: 01/29/2016 23:36   Dg Chest Portable 1 View  01/29/2016  CLINICAL DATA:  Endotracheal tube placement. Orogastric tube placement. Syncope. Initial encounter. EXAM: PORTABLE CHEST 1 VIEW COMPARISON:  Chest radiograph from 11/25/2015 FINDINGS: The endotracheal tube is seen ending 2 cm above the carina. An enteric tube is noted extending below the diaphragm. The lungs are hypoexpanded. Vascular crowding is noted. Right basilar airspace opacity may reflect pneumonia. No pleural effusion or pneumothorax is seen. The cardiomediastinal silhouette is borderline normal in size. No acute osseous abnormalities are identified. External pacing pads are seen. IMPRESSION: 1. Endotracheal tube seen ending 2 cm above the carina. 2. Enteric tube noted extending below the diaphragm. 3. Lungs hypoexpanded. Right basilar airspace opacity may reflect pneumonia. Would correlate with the patient's symptoms. If there are symptoms of pneumonia, follow-up PA and lateral chest X-ray is recommended in 3-4 weeks following trial of antibiotic therapy to ensure resolution and exclude underlying malignancy. Electronically Signed   By: Beryle Beams.D.  On: 01/29/2016 21:18     STUDIES:  CXR 02/08 > low volumes.  Right basilar opacity. CT head 02/08 > No acute process. CTA chest 02/08 > PE in LLL.  RV / LV 1.6.  Echo 02/09 > LE duplex 02/09 > EEG 02/09 >  CULTURES: Sputum 02/08 >  ANTIBIOTICS: None.  SIGNIFICANT EVENTS: 02/08 > admitted following cardiac arrest.  Unknown etiology at this point but PE high on differential. 02/09 > after arrival to ICU, awake and following commands.  LINES/TUBES: ETT 02/08  >   ASSESSMENT / PLAN:  PULMONARY A: VDRF - due to cardiac arrest. Mixed acidosis - improved. Massive Pulmonary Embolism - provoked presumed due to recent surgery + decreased activity + refusal to take lovenox. P:   Full vent support - keep intubated overnight and plan for probable extubation in AM. VAP prevention measures. SBT in AM with goal extubation. Albuterol PRN. Initiate heparin gtt for PE. Will need outpatient anticoagulation - importance of compliance stressed to pt's sister. CXR in AM.  CARDIOVASCULAR A:  S/p PEA arrest - due to PE for which he empirically received TNKase while in ED. Trop from PE RV strain P:  D/c hypothermia protocol as pt is awake and following commands. Follow echo for RV and Pa pressures Trend troponin / lactate. Hep drip  RENAL A:   Hypokalemia. AKI. AGMA - lactate. Hypocalcemia. Hyperphosphatemia. P:   NS @ 125 1g Ca gluconate. Follow BMP.  GASTROINTESTINAL A:   Obesity. GERD. Nutrition vomiting P:   SUP: Pantoprazole. NPO.  HEMATOLOGIC A:   Mild anemia. Thrombocytopenia. VTE Prophylaxis. Massive PE P:  Transfuse for hgb <7. Monitor platelet counts. Heparin gtt.  No SCD's for now until DVT ruled out. Coags and CBC in AM. Heparin  INFECTIOUS A:   Hx HIV - followed by Dr. Ninetta Lights.  Last CD4 count 420 (January 2017). Leukocytosis - likely acute phase reactant, but consider aspiration. P:   Assess CD4. Day team to please notify ID. Follow cultures as above. Pct done  ENDOCRINE A:   No acute issues. P:   Monitor glucose on BMP. Assess TSH.  NEUROLOGIC A:   Acute metabolic encephalopathy - improving despite cardiac arrest. ? Seizure disorder - doubt true seizures, favor hypoxia due to PE. P:   Sedation:  Fentanyl PRN / Midazolam PRN. RASS goal: 0 to -1. WUA in AM. EEG. Consider neuro consult.   Family updated: Sister and family friend at bedside.  Interdisciplinary Family Meeting v Palliative  Care Meeting:  Due by: 02/14.  CC time: 30 minutes.    Rutherford Guys, [place] [facility] Pager: [phone]  or [phone] 01/30/2016, 12:20 AM   STAFF NOTE: I, Rory Percy, MD FACP have personally reviewed patient's available data, including medical history, events of note, physical examination and test results as part of my evaluation. I have discussed with resident/NP and other care providers such as pharmacist, RN and RRT. In addition, I personally evaluated patient and elicited key findings of: awake alert, cooperative, ronchi diffuse moderate, obese, left leg greater RT, CT reviewed, concern ASPiration remains in setting HIV, PCT not relaible with immunosuppressed - dc, ABg last reviewed, keep a reasonable MV, would consider abg  But s/p TNK, i called as spoke to RT, reduce peep to 5 and goal 50% then CPAP 5 PS 5 goal 1 hr, sit upright, likely will extubate this am after wean and WUA, ECHo  Awaited, need  dopler legs rt is karger then left also, ensure no mobile clot, k supp, need follow up chem in afternoon, if not extubated will feed, some caution with prior vomiting, heparin drip transition, HCT stable, wound knee wnl, no hematoma, pcxr and ronchi , CT reviewed empiric unasyn for now for asp The patient is critically ill with multiple organ systems failure and requires high complexity decision making for assessment and support, frequent evaluation and titration of therapies, application of advanced monitoring technologies and extensive interpretation of multiple databases.   Critical Care Time devoted to patient care services described in this note is35 Minutes. This time reflects time of care of this signee: Rory Percy, MD FACP. This critical care time does not reflect procedure time, or teaching time or supervisory time of PA/NP/Med student/Med Resident etc but could involve care discussion time. Rest per NP/medical resident whose note is  outlined above and that I agree with   Mcarthur Rossetti. Tyson Alias, MD, FACP Pgr: [phone] [facility] 01/30/2016 6:19 AM

## 2016-01-30 NOTE — Progress Notes (Signed)
CRITICAL VALUE ALERT  Critical value received:  Trop 3.96 and lactic acid 7.5  Date of notification:  2/9  Time of notification:  0145  Critical value read back:Yes.    Nurse who received alert:  Lexi  MD notified (1st page):  Dr. Belia Heman  Time of first page:  0145  MD notified (2nd page):  Time of second page:  Responding MD:  Dr. Belia Heman  Time MD responded:  [phone]

## 2016-01-30 NOTE — Procedures (Signed)
Extubation Procedure Note  Patient Details:   Name: Todd Parrish DOB: February 15, 1981 MRN: [id number]   Airway Documentation:     Evaluation  O2 sats: stable throughout Complications: No apparent complications Patient did tolerate procedure well. Bilateral Breath Sounds: Clear, Diminished Suctioning: Airway Yes   Positive cuff leak noted prior to extubation. Pt placed on [place] 4 Lpm with humidity.  Rayburn Felt 01/30/2016, 9:38 AM

## 2016-01-30 NOTE — Progress Notes (Signed)
eLink Physician-Brief Progress Note Patient Name: Todd Parrish DOB: 10-11-1981 MRN: [id number]   Date of Service  01/30/2016  HPI/Events of Note  Diarrhea - 3+ watery stools today. No laxatives. Recent hospital/rehab stay.  eICU Interventions  Will order: 1. C. Difficile rapid assay.  2. Enteric precautions. 3. Questran 4 grams PO TID.     Intervention Category Intermediate Interventions: Other:  [name] 01/30/2016, 7:21 PM

## 2016-01-30 NOTE — Progress Notes (Signed)
eLink Physician-Brief Progress Note Patient Name: Todd Parrish DOB: [birth year] MRN: [id number]   Date of Service  01/30/2016  HPI/Events of Note  Extubated this AM. Has been on clear liquids all day without problems.   eICU Interventions  Will advance diet to heart health carb modified.      Intervention Category Minor Interventions: Routine modifications to care plan (e.g. PRN medications for pain, fever)  Todd Parrish 01/30/2016, 4:15 PM

## 2016-01-30 NOTE — Progress Notes (Signed)
Utilization review completed. Meshach Perry, RN, BSN. 

## 2016-01-30 NOTE — Progress Notes (Signed)
Pharmacy Antibiotic Note  Todd Parrish is a 35 y.o. male admitted on 01/29/2016 with aspiration 2/2 cardiac arrest.  Pharmacy has been consulted for Unasyn dosing.  Plan: Will begin Unasyn 3g IV Q6H and monitor CBC and Cx.  Height:  (170.2 cm) Weight: (!) 340 lb 2.7 oz (154.3 kg) IBW/kg (Calculated) : 66.1  Temp (24hrs), Avg:98.9 F (37.2 C), Min:98.8 F (37.1 C), Max:99 F (37.2 C)   Recent Labs Lab 01/29/16 2020 01/29/16 2024 01/30/16 0020 01/30/16 0022 01/30/16 0320  WBC 19.3*  --  14.6*  --   --   CREATININE 1.70* 1.30* 1.69* 1.30*  --   LATICACIDVEN  --  16.73* 7.5*  --  5.8*    Estimated Creatinine Clearance: 112.7 mL/min (by C-G formula based on Cr of 1.3).    Allergies  Allergen Reactions  . Morphine And Related Nausea And Vomiting    Elevated blood pressure     Thank you for allowing pharmacy to be a part of this patient's care.  Vernard Gambles, PharmD, BCPS 01/30/2016 6:37 AM

## 2016-01-30 NOTE — Progress Notes (Signed)
PULMONARY / CRITICAL CARE MEDICINE   Name: Todd Parrish MRN: [id number] DOB: 1981-01-17    ADMISSION DATE:  01/29/2016 CONSULTATION DATE:  01/29/16  REFERRING MD:  EDP  CHIEF COMPLAINT:  Cardiac Arrest  HISTORY OF PRESENT ILLNESS:  Pt is encephelopathic; therefore, this HPI is obtained from chart review. Todd Parrish is a 35 y.o. male with PMH as outlined below and who had been in rehab facility since December 10, 2015 following ORIF of right bicondylar tibial plateau fx and right knee arthrotomy and fasciotomy.  He had apparently been doing well at rehab and was being discharged on evening of 01/29/16. While in the car to go home, he suddenly had syncopal episode.  Sister immediately called EMS and staff from rehab facility came out to help.  Pt never lost pulses and regained consciousness immediately; however, had had an additional 2 syncopal episodes (he regained consciousness in between each time).   On EMS arrival, he was found to be hypoxic and after they got him into ambulance, he reportedly had 2 seizure like episodes and was gasping for breath stating that he could not breathe.  On ED arrival, he was significantly SOB and barely able to communicate.  He then became agonal and had bradycardia.  He was given narcan and had questionable response.  He then became bradycardic and agonal again and was then noted to be pulseless.  He had brief episodes of PEA arrest (2 - 3 minutes before ROSC on each occasion).  During these events, he was intubated for respiratory insufficiency and he was given TNKase push dose for presumed PE.  He had marked resp acidosis on initial ABG and vent was adjusted accordingly.  Repeat ABG had significant improvement.  On further questioning with pt's family, pt had been doing well at rehab and had not been complaining of anything at all.  Of note however, he had been refusing lovenox as he was told by somebody that he didn't need it.  His  sister also reports that she felt he had LE swelling, but pt never complained of this or any LE pain.  Sister does state that although he had been participating in rehab, he had not been very active over the past 1 month.    SUBJECTIVE:  On vent, now on SBT and tolerating anticoagulated with heparin gtt  VITAL SIGNS: BP 110/69 mmHg  Pulse 110  Temp(Src) 99.7 F (37.6 C) (Oral)  Resp 27  Ht 5\' 7"  (1.702 m)  Wt 154.3 kg (340 lb 2.7 oz)  BMI 53.27 kg/m2  SpO2 100%  HEMODYNAMICS:    VENTILATOR SETTINGS: Vent Mode:  [-] PSV;CPAP FiO2 (%):  [40 %-70 %] 40 % Set Rate:  [30 bmp] 30 bmp Vt Set:  [600 mL] 600 mL PEEP:  [5 cmH20-8 cmH20] 5 cmH20 Pressure Support:  [5 cmH20] 5 cmH20 Plateau Pressure:  [24 cmH20-28 cmH20] 24 cmH20  INTAKE / OUTPUT: I/O last 3 completed shifts: In: 2663.7 [I.V.:2553.7; IV Piggyback:110] Out: 377 [Other:375; Stool:2]   PHYSICAL EXAMINATION: General: Young AA male, critically ill. Neuro: Sedated.  Non-responsive. HEENT: [place]/AT. PERRL, sclerae anicteric. Cardiovascular: RRR, no M/R/G.  Lungs: Respirations even and unlabored.  Coarse bilaterally. Abdomen: Obese. BS x 4, soft, NT/ND.  Urologic: Large hernia protruding into scrotum. Musculoskeletal: No gross deformities, 1+ BLE edema Skin: Intact, warm, no rashes.  LABS:  BMET  Recent Labs Lab 01/29/16 2020 01/29/16 2024 01/30/16 0020 01/30/16 0022  NA 141 139 143 145  K 3.4* 3.4*  3.5 3.3*  CL 102 101 110 106  CO2 14*  --  16*  --   BUN 8 9 10 10   CREATININE 1.70* 1.30* 1.69* 1.30*  GLUCOSE 436* 444* 243* 234*    Electrolytes  Recent Labs Lab 01/29/16 2020 01/30/16 0020  CALCIUM 8.5* 6.7*  MG 2.5* 1.7  PHOS 12.0* 6.5*    CBC  Recent Labs Lab 01/29/16 2020 01/29/16 2024 01/30/16 0020 01/30/16 0022  WBC 19.3*  --  14.6*  --   HGB 11.1* 14.3 11.1* 13.6  HCT 37.9* 42.0 37.1* 40.0  PLT 100*  --  141*  --     Coag's  Recent Labs Lab 01/29/16 2020 01/30/16 0320   APTT 62*  --   INR 1.97* 2.31*    Sepsis Markers  Recent Labs Lab 01/29/16 2024 01/30/16 0020 01/30/16 0320  LATICACIDVEN 16.73* 7.5* 5.8*  PROCALCITON  --  7.74  --     ABG  Recent Labs Lab 01/29/16 2023 01/29/16 2200  PHART 6.765* 7.239*  PCO2ART 81.4* 42.2  PO2ART 128.0* 77.0*    Liver Enzymes  Recent Labs Lab 01/29/16 2020  AST 201*  ALT 210*  ALKPHOS 73  BILITOT 0.5  ALBUMIN 2.7*    Cardiac Enzymes  Recent Labs Lab 01/30/16 0020  TROPONINI 3.96*    Glucose  Recent Labs Lab 01/29/16 1947  GLUCAP 165*    Imaging Ct Head Wo Contrast  01/29/2016  CLINICAL DATA:  Continued surveillance cardiac arrest. Three syncopal episodes and 2 seizure-like episodes EXAM: CT HEAD WITHOUT CONTRAST TECHNIQUE: Contiguous axial images were obtained from the base of the skull through the vertex without intravenous contrast. COMPARISON:  None. FINDINGS: No evidence for acute infarction, hemorrhage, mass lesion, hydrocephalus, or extra-axial fluid. Normal cerebral volume. No white matter disease. Gray-white junction preserved throughout. Calvarium intact. Mild sinus fluid. Negative mastoids. Unremarkable orbits. IMPRESSION: Negative exam.  No intracranial mass lesion or acute findings. Electronically Signed   By: Elsie Stain M.D.   On: 01/29/2016 23:19   Ct Angio Chest Pe W/cm &/or Wo Cm  01/29/2016  CLINICAL DATA:  Status post cardiac arrest.  Initial encounter. EXAM: CT ANGIOGRAPHY CHEST WITH CONTRAST TECHNIQUE: Multidetector CT imaging of the chest was performed using the standard protocol during bolus administration of intravenous contrast. Multiplanar CT image reconstructions and MIPs were obtained to evaluate the vascular anatomy. CONTRAST:  OMNIPAQUE IOHEXOL 350 MG/ML SOLN COMPARISON:  Chest radiograph performed earlier today at 8:41 p.m. FINDINGS: There appears to be pulmonary embolus within the pulmonary artery to the left lower lobe. An RV/LV ratio of 1.6  raises concern for right heart strain and submassive pulmonary embolus. Patchy airspace consolidation is noted about the right hilum and at both lung bases. This may reflect multifocal pneumonia, though post-code atelectasis might have a similar appearance. There is no evidence of pleural effusion or pneumothorax. No masses are identified; no abnormal focal contrast enhancement is seen. The patient's endotracheal tube balloon appears mildly overinflated. The endotracheal tube is seen ending 2 cm above the carina. No definite mediastinal lymphadenopathy is seen, though evaluation is somewhat suboptimal due to the patient's habitus and enteric tube. No pericardial effusion is identified. No axillary lymphadenopathy is seen. The visualized portions of the thyroid gland are unremarkable in appearance. There is reflux of contrast into the hepatic veins and IVC. A small amount of ascites is noted. Evaluation is somewhat suboptimal due to the patient's habitus, but the left hepatic lobe appears to drape over the  spleen. Vague mild soft tissue inflammation is suggested about the visualized pancreas, and mild pancreatitis cannot be excluded. Would correlate with the patient's lab values. No acute osseous abnormalities are seen. Review of the MIP images confirms the above findings. IMPRESSION: 1. Pulmonary embolus within the pulmonary artery to the left lower lobe. CT evidence of right heart strain (RV/LV Ratio = 1.6) consistent with at least submassive (intermediate risk) PE. The presence of right heart strain has been associated with an increased risk of morbidity and mortality. Please activate Code PE by paging [phone]. 2. Patchy airspace consolidation about the right hilum and at both lung bases. This may reflect multifocal pneumonia, though post-code atelectasis might have a similar appearance. 3. Endotracheal tube balloon appears mildly overinflated. This could be slightly deflated, as deemed clinically  appropriate. 4. Small amount of ascites noted. Vague soft tissue inflammation is suggested about the visualized pancreas. Mild pancreatitis cannot be excluded, though this may simply be artifactual. Would correlate with lab values. 5. Reflux of contrast into the hepatic veins and IVC. Critical Value/emergent results were called by telephone at the time of interpretation on 01/29/2016 at 11:34 pm to Dr. Esperanza Richters, who verbally acknowledged these results. Electronically Signed   By: Roanna Raider M.D.   On: 01/29/2016 23:36   Dg Chest Port 1 View  01/30/2016  CLINICAL DATA:  Respiratory failure. EXAM: PORTABLE CHEST 1 VIEW COMPARISON:  CT 01/29/2016.  Chest x-ray 08/17/2016 . FINDINGS: Endotracheal tube and NG tube in stable position. Mediastinum hilar structures normal. Low lung volumes with dense bibasilar atelectasis and/or infiltrates. No pleural effusion or pneumothorax. IMPRESSION: 1. Lines and tubes in stable position. 2. Low lung volumes with dense bibasilar atelectasis and/or infiltrates . Similar findings noted on prior exam. Electronically Signed   By: Maisie Fus  Register   On: 01/30/2016 07:15   Dg Chest Portable 1 View  01/29/2016  CLINICAL DATA:  Endotracheal tube placement. Orogastric tube placement. Syncope. Initial encounter. EXAM: PORTABLE CHEST 1 VIEW COMPARISON:  Chest radiograph from 11/25/2015 FINDINGS: The endotracheal tube is seen ending 2 cm above the carina. An enteric tube is noted extending below the diaphragm. The lungs are hypoexpanded. Vascular crowding is noted. Right basilar airspace opacity may reflect pneumonia. No pleural effusion or pneumothorax is seen. The cardiomediastinal silhouette is borderline normal in size. No acute osseous abnormalities are identified. External pacing pads are seen. IMPRESSION: 1. Endotracheal tube seen ending 2 cm above the carina. 2. Enteric tube noted extending below the diaphragm. 3. Lungs hypoexpanded. Right basilar airspace opacity may reflect  pneumonia. Would correlate with the patient's symptoms. If there are symptoms of pneumonia, follow-up PA and lateral chest X-ray is recommended in 3-4 weeks following trial of antibiotic therapy to ensure resolution and exclude underlying malignancy. Electronically Signed   By: Roanna Raider M.D.   On: 01/29/2016 21:18     STUDIES:  CXR 02/08 > low volumes.  Right basilar opacity. CT head 02/08 > CTA chest 02/08 > Echo 02/09 > LE duplex 02/09 > EEG 02/09 >  CULTURES: Sputum 02/08 >  ANTIBIOTICS: None.  SIGNIFICANT EVENTS: 02/08 > admitted following cardiac arrest.  Unknown etiology at this point but PE high on differential.  LINES/TUBES: ETT 02/08 >   ASSESSMENT / PLAN:  PULMONARY A: VDRF - due to cardiac arrest. Mixed acidosis. Documented L PA PE - s/p TNKase in ED. R basilar atx / opacity, ? Consider PNA P:   Full vent support Wean as able, goal extubation 2/9.  VAP  prevention measures. Albuterol PRN.   CARDIOVASCULAR A:  S/p PEA arrest - due to acute PE P:  Hypothermia protocol stopped due to improved MS Levophed as needed for goal MAP > 70, wean to off Follow echo. Trend troponin / lactate.  RENAL A:   Hypokalemia. AKI, improved  AGMA - lactate, clearing  Hypocalcemia. Hyperphosphatemia. P:   NS @ 125. Frequent BMP's.  GASTROINTESTINAL A:   Obesity. GERD. Nutrition. P:   SUP: Pantoprazole. NPO.  HEMATOLOGIC A:   Mild anemia. Thrombocytopenia in setting acute clot VTE Prophylaxis. P:  Transfuse for hgb <7. Monitor platelet counts. Heparin gtt Coags now and q8hrs x 2. CBC in AM. Will discuss long term anticoag plan once stabilized   INFECTIOUS A:   Hx HIV - followed by Dr. Ninetta Lights.  Last CD4 count 420 (January 2017). Leukocytosis - likely acute phase reactant, but consider aspiration. P:   Assess CD4. Follow cultures as above. Empiric unasyn added 2/9  ENDOCRINE A:   Anticipate hyperglycemia during hypothermia protocol.   P:   ICU hyperglycemia protocol. Assess TSH.  NEUROLOGIC A:   Acute metabolic encephalopathy. ? Seizure disorder. P:   Sedation:  Fentanyl gtt / Midazolam gtt. RASS goal: -1 Neuro consulted.   Family updated: Sister in person, mother over the phone.  Interdisciplinary Family Meeting v Palliative Care Meeting:  Due by: 02/14.  Independent CC time 35 minutes   Levy Pupa, MD, PhD 01/30/2016, 8:22 AM [facility] [phone] or if no answer [phone]

## 2016-01-31 ENCOUNTER — Encounter: Payer: Self-pay | Admitting: Internal Medicine

## 2016-01-31 ENCOUNTER — Inpatient Hospital Stay (HOSPITAL_COMMUNITY): Payer: Medicaid Other

## 2016-01-31 DIAGNOSIS — I2699 Other pulmonary embolism without acute cor pulmonale: Principal | ICD-10-CM

## 2016-01-31 LAB — COMPREHENSIVE METABOLIC PANEL
ALBUMIN: 2.3 g/dL — AB (ref 3.5–5.0)
ALK PHOS: 67 U/L (ref 38–126)
ALT: 262 U/L — ABNORMAL HIGH (ref 17–63)
ANION GAP: 10 (ref 5–15)
AST: 124 U/L — ABNORMAL HIGH (ref 15–41)
BILIRUBIN TOTAL: 0.4 mg/dL (ref 0.3–1.2)
BUN: 17 mg/dL (ref 6–20)
CALCIUM: 7.8 mg/dL — AB (ref 8.9–10.3)
CO2: 21 mmol/L — ABNORMAL LOW (ref 22–32)
Chloride: 109 mmol/L (ref 101–111)
Creatinine, Ser: 2.91 mg/dL — ABNORMAL HIGH (ref 0.61–1.24)
GFR calc Af Amer: 30 mL/min — ABNORMAL LOW (ref >60)
GFR calc non Af Amer: 26 mL/min — ABNORMAL LOW (ref >60)
GLUCOSE: 114 mg/dL — AB (ref 65–99)
Potassium: 4.1 mmol/L (ref 3.5–5.1)
Sodium: 140 mmol/L (ref 135–145)
TOTAL PROTEIN: 5.8 g/dL — AB (ref 6.5–8.1)

## 2016-01-31 LAB — CBC
HCT: 24.2 % — ABNORMAL LOW (ref 39.0–52.0)
HEMOGLOBIN: 7.5 g/dL — AB (ref 13.0–17.0)
MCH: 23.9 pg — ABNORMAL LOW (ref 26.0–34.0)
MCHC: 31 g/dL (ref 30.0–36.0)
MCV: 77.1 fL — ABNORMAL LOW (ref 78.0–100.0)
Platelets: 137 10*3/uL — ABNORMAL LOW (ref 150–400)
RBC: 3.14 MIL/uL — AB (ref 4.22–5.81)
RDW: 14.3 % (ref 11.5–15.5)
WBC: 9.5 10*3/uL (ref 4.0–10.5)

## 2016-01-31 LAB — CBC WITH DIFFERENTIAL/PLATELET
BASOS ABS: 0 10*3/uL (ref 0.0–0.1)
BASOS PCT: 0 %
EOS ABS: 0.1 10*3/uL (ref 0.0–0.7)
EOS PCT: 1 %
HCT: 26.2 % — ABNORMAL LOW (ref 39.0–52.0)
Hemoglobin: 8.1 g/dL — ABNORMAL LOW (ref 13.0–17.0)
Lymphocytes Relative: 20 %
Lymphs Abs: 1.6 10*3/uL (ref 0.7–4.0)
MCH: 23.8 pg — ABNORMAL LOW (ref 26.0–34.0)
MCHC: 30.9 g/dL (ref 30.0–36.0)
MCV: 76.8 fL — ABNORMAL LOW (ref 78.0–100.0)
MONO ABS: 0.8 10*3/uL (ref 0.1–1.0)
Monocytes Relative: 10 %
NEUTROS ABS: 5.7 10*3/uL (ref 1.7–7.7)
Neutrophils Relative %: 69 %
PLATELETS: 117 10*3/uL — AB (ref 150–400)
RBC: 3.41 MIL/uL — ABNORMAL LOW (ref 4.22–5.81)
RDW: 14.2 % (ref 11.5–15.5)
WBC: 8.3 10*3/uL (ref 4.0–10.5)

## 2016-01-31 LAB — BASIC METABOLIC PANEL
ANION GAP: 12 (ref 5–15)
BUN: 18 mg/dL (ref 6–20)
CALCIUM: 7.9 mg/dL — AB (ref 8.9–10.3)
CO2: 19 mmol/L — ABNORMAL LOW (ref 22–32)
Chloride: 108 mmol/L (ref 101–111)
Creatinine, Ser: 3.13 mg/dL — ABNORMAL HIGH (ref 0.61–1.24)
GFR, EST AFRICAN AMERICAN: 28 mL/min — AB (ref >60)
GFR, EST NON AFRICAN AMERICAN: 24 mL/min — AB (ref >60)
Glucose, Bld: 138 mg/dL — ABNORMAL HIGH (ref 65–99)
Potassium: 4.5 mmol/L (ref 3.5–5.1)
SODIUM: 139 mmol/L (ref 135–145)

## 2016-01-31 LAB — GLUCOSE, CAPILLARY
GLUCOSE-CAPILLARY: 109 mg/dL — AB (ref 65–99)
GLUCOSE-CAPILLARY: 131 mg/dL — AB (ref 65–99)
Glucose-Capillary: 137 mg/dL — ABNORMAL HIGH (ref 65–99)
Glucose-Capillary: 122 mg/dL — ABNORMAL HIGH (ref 65–99)

## 2016-01-31 LAB — MRSA PCR SCREENING: MRSA by PCR: NEGATIVE

## 2016-01-31 LAB — HEPARIN LEVEL (UNFRACTIONATED): Heparin Unfractionated: 0.4 IU/mL (ref 0.30–0.70)

## 2016-01-31 MED ORDER — HYDROCODONE-ACETAMINOPHEN 10-325 MG PO TABS
1.0000 | ORAL_TABLET | Freq: Four times a day (QID) | ORAL | Status: DC | PRN
  Administered 2016-01-31 – 2016-02-06 (×7): 1 via ORAL
  Filled 2016-01-31 (×8): qty 1

## 2016-01-31 MED ORDER — ACETAMINOPHEN 325 MG PO TABS
650.0000 mg | ORAL_TABLET | Freq: Four times a day (QID) | ORAL | Status: DC | PRN
  Administered 2016-02-01 – 2016-02-02 (×3): 650 mg via ORAL
  Administered 2016-02-03: 325 mg via ORAL
  Filled 2016-01-31 (×6): qty 2

## 2016-01-31 MED ORDER — INSULIN ASPART 100 UNIT/ML ~~LOC~~ SOLN
0.0000 [IU] | Freq: Three times a day (TID) | SUBCUTANEOUS | Status: DC
  Administered 2016-01-31: 1 [IU] via SUBCUTANEOUS
  Administered 2016-01-31: 2 [IU] via SUBCUTANEOUS
  Administered 2016-02-01: 1 [IU] via SUBCUTANEOUS

## 2016-01-31 MED ORDER — PANTOPRAZOLE SODIUM 40 MG PO TBEC
40.0000 mg | DELAYED_RELEASE_TABLET | Freq: Every day | ORAL | Status: DC
  Administered 2016-01-31 – 2016-02-06 (×7): 40 mg via ORAL
  Filled 2016-01-31 (×7): qty 1

## 2016-01-31 MED ORDER — INSULIN ASPART 100 UNIT/ML ~~LOC~~ SOLN: 0.0000 [IU] | Freq: Every day | SUBCUTANEOUS | Status: DC

## 2016-01-31 NOTE — Progress Notes (Signed)
PULMONARY / CRITICAL CARE MEDICINE   Name: Todd Parrish MRN: [id number] DOB: 1981/03/24    ADMISSION DATE:  01/29/2016 CONSULTATION DATE:  01/29/16  REFERRING MD:  EDP  CHIEF COMPLAINT:  Cardiac Arrest  HISTORY OF PRESENT ILLNESS:  Pt is encephelopathic; therefore, this HPI is obtained from chart review. Todd Parrish is a 35 y.o. male with PMH as outlined below and who had been in rehab facility since December 10, 2015 following ORIF of right bicondylar tibial plateau fx and right knee arthrotomy and fasciotomy.  He had apparently been doing well at rehab and was being discharged on evening of 01/29/16. While in the car to go home, he suddenly had syncopal episode.  Sister immediately called EMS and staff from rehab facility came out to help.  Pt never lost pulses and regained consciousness immediately; however, had had an additional 2 syncopal episodes (he regained consciousness in between each time).   On EMS arrival, he was found to be hypoxic and after they got him into ambulance, he reportedly had 2 seizure like episodes and was gasping for breath stating that he could not breathe.  On ED arrival, he was significantly SOB and barely able to communicate.  He then became agonal and had bradycardia.  He was given narcan and had questionable response.  He then became bradycardic and agonal again and was then noted to be pulseless.  He had brief episodes of PEA arrest (2 - 3 minutes before ROSC on each occasion).  During these events, he was intubated for respiratory insufficiency and he was given TNKase push dose for presumed PE.  He had marked resp acidosis on initial ABG and vent was adjusted accordingly.  Repeat ABG had significant improvement.  On further questioning with pt's family, pt had been doing well at rehab and had not been complaining of anything at all.  Of note however, he had been refusing lovenox as he was told by somebody that he didn't need it.  His  sister also reports that she felt he had LE swelling, but pt never complained of this or any LE pain.  Sister does state that although he had been participating in rehab, he had not been very active over the past 1 month.    SUBJECTIVE:  On vent, now on SBT and tolerating anticoagulated with heparin gtt  VITAL SIGNS: BP 133/88 mmHg  Pulse 121  Temp(Src) 98.2 F (36.8 C) (Oral)  Resp 32  Ht  (1.702 m)  Wt 154.3 kg (340 lb 2.7 oz)  BMI 53.27 kg/m2  SpO2 97%  HEMODYNAMICS:    VENTILATOR SETTINGS:    INTAKE / OUTPUT: I/O last 3 completed shifts: In: 3868.7 [I.V.:3258.7; IV Piggyback:610] Out: 2127 [Urine:1750; Other:375; Stool:2]   PHYSICAL EXAMINATION: General: Young AA male, critically ill. Neuro: Sedated.  Non-responsive. HEENT: [place]/AT. PERRL, sclerae anicteric. Cardiovascular: RRR, no M/R/G.  Lungs: Respirations even and unlabored.  Coarse bilaterally. Abdomen: Obese. BS x 4, soft, NT/ND.  Urologic: Large hernia protruding into scrotum. Musculoskeletal: No gross deformities, 1+ BLE edema Skin: Intact, warm, no rashes.  LABS:  BMET  Recent Labs Lab 01/29/16 2020  01/30/16 0020 01/30/16 0022 01/31/16 0450  NA 141  < > 143 145 140  K 3.4*  < > 3.5 3.3* 4.1  CL 102  < > 110 106 109  CO2 14*  --  16*  --  21*  BUN 8  < > CREATININE 1.70*  < > 1.69* 1.30*  2.91*  GLUCOSE 436*  < > 243* 234* 114*  < > = values in this interval not displayed.  Electrolytes  Recent Labs Lab 01/29/16 2020 01/30/16 0020 01/31/16 0450  CALCIUM 8.5* 6.7* 7.8*  MG 2.5* 1.7  --   PHOS 12.0* 6.5*  --     CBC  Recent Labs Lab 01/29/16 2020  01/30/16 0020 01/30/16 0022 01/31/16 0450  WBC 19.3*  --  14.6*  --  8.3  HGB 11.1*  < > 11.1* 13.6 8.1*  HCT 37.9*  < > 37.1* 40.0 26.2*  PLT 100*  --  141*  --  117*  < > = values in this interval not displayed.  Coag's  Recent Labs Lab 01/29/16 2020 01/30/16 0320  APTT 62*  --   INR 1.97* 2.31*    Sepsis  Markers  Recent Labs Lab 01/29/16 2024 01/30/16 0020 01/30/16 0320  LATICACIDVEN 16.73* 7.5* 5.8*  PROCALCITON  --  7.74  --     ABG  Recent Labs Lab 01/29/16 2023 01/29/16 2200  PHART 6.765* 7.239*  PCO2ART 81.4* 42.2  PO2ART 128.0* 77.0*    Liver Enzymes  Recent Labs Lab 01/29/16 2020 01/31/16 0450  AST 201* 124*  ALT 210* 262*  ALKPHOS 73 67  BILITOT 0.5 0.4  ALBUMIN 2.7* 2.3*    Cardiac Enzymes  Recent Labs Lab 01/30/16 0020  TROPONINI 3.96*    Glucose  Recent Labs Lab 01/29/16 1947 01/30/16 2015 01/30/16 2315 01/31/16 0809  GLUCAP 165* 97 96 109*    Imaging Dg Chest Port 1 View  01/31/2016  CLINICAL DATA:  Pulmonary embolism. Soreness in the chest, cardiopulmonary resuscitation last night at 6 p.m. EXAM: PORTABLE CHEST 1 VIEW COMPARISON:  01/30/2016 FINDINGS: Low lung volumes are present, causing crowding of the pulmonary vasculature. Indistinct airspace opacity in the medial basilar and perihilar regions potentially from atelectasis, pneumonia, or pulmonary hemorrhage. Heart size within normal limits for technique. No blunting of the costophrenic angles. The patient has been intervally extubated. IMPRESSION: 1. Continued right perihilar and medial basilar airspace opacity with some mild increase in left retrocardiac airspace opacity potentially from atelectasis, pneumonia, or pulmonary hemorrhage in the setting of the patient's known pulmonary embolus. 2. Low lung volumes are present, causing crowding of the pulmonary vasculature. 3. Interval extubation. Electronically Signed   By: Gaylyn Rong M.D.   On: 01/31/2016 06:46     STUDIES:  CXR 02/08 > low volumes.  Right basilar opacity. CT head 02/08 > CTA chest 02/08 > Echo 02/09 > LE duplex 02/09 > EEG 02/09 >  CULTURES: Sputum 02/08 >  ANTIBIOTICS: None.  SIGNIFICANT EVENTS: 02/08 > admitted following cardiac arrest.  Unknown etiology at this point but PE high on  differential.  LINES/TUBES: ETT 02/08 >   ASSESSMENT / PLAN:  PULMONARY A: VDRF - due to cardiac arrest. Mixed acidosis. Documented L PA PE - s/p TNKase in ED. R basilar atx / opacity, ? Consider PNA P:   Extubated 2/9 VAP prevention measures. Albuterol PRN.   CARDIOVASCULAR TTE > RV cavity size upper limit of normal.  A:  S/p PEA arrest - due to acute PE P:  Hypothermia protocol stopped due to improved MS Levophed off Follow echo.  RENAL A:   Hypokalemia. AKI, improved  AGMA - lactate, clearing  Hypocalcemia. Hyperphosphatemia. P:   NS @ KVO Follow BMP .  GASTROINTESTINAL A:   Obesity. GERD. Nutrition. Diarrhea P:   SUP: Pantoprazole. Advance diet With recent hospitalization, C  diff ordered and pending  HEMATOLOGIC A:   Mild anemia. Thrombocytopenia in setting acute clot VTE Prophylaxis. P:  Transfuse for hgb <7. Monitor platelet counts. Heparin gtt Coags now and q8hrs x 2. CBC in AM. Will need to discuss long term anticoag plan with ortho - they were OK with heparin gtt so I believe we should be able to start a DOAC  INFECTIOUS A:   Hx HIV - followed by Dr. Ninetta Lights.  Last CD4 count 420 (January 2017). Leukocytosis - likely acute phase reactant, but consider aspiration. P:   Assess CD4. Follow cultures as above. Empiric unasyn added 2/9  ENDOCRINE A:   Hyperglycemia  P:   SSI   NEUROLOGIC A:   Acute metabolic encephalopathy. ? Seizure disorder. P:   Sedation:  Off  RASS goal: 0   Family updated: Sister in person, mother over the phone.  Interdisciplinary Family Meeting v Palliative Care Meeting:  Due by: 02/14.  To SDU bed and to [facility] as of 2/11. PCCM will follow as a consult.    Levy Pupa, MD, PhD 01/31/2016, 12:00 PM [facility] [phone] or if no answer [phone]

## 2016-01-31 NOTE — Progress Notes (Signed)
eLink Physician-Brief Progress Note Patient Name: Todd Parrish DOB: 1981-09-03 MRN: [id number]   Date of Service  01/31/2016  HPI/Events of Note  RN notified of normal BG. Tolerating diet post extubation.  eICU Interventions  1. Switching Accu-Checks to [facility] & HS 2. Continuing SSI for now     Intervention Category Intermediate Interventions: Hyperglycemia - evaluation and treatment  Lawanda Cousins 01/31/2016, 12:38 AM

## 2016-01-31 NOTE — Assessment & Plan Note (Signed)
SNF - cont appropriate diet and encourage activity

## 2016-01-31 NOTE — Assessment & Plan Note (Signed)
SNF -Long time problem, usually in the am; continue antivert TID prn

## 2016-01-31 NOTE — Assessment & Plan Note (Signed)
SNF - has been there for years;pt can f/u as outpt

## 2016-01-31 NOTE — Assessment & Plan Note (Signed)
SNF - RLE, neurontin 300 mg qHS for numbness r leg

## 2016-01-31 NOTE — Assessment & Plan Note (Signed)
OPEN REDUCTION INTERNAL FIXATION (ORIF) RIGHT BICONDYLAR PLATEAU  RIGHT KNEE ARTHROTOMY FASCIOTOMY. APPLICATION OF WOUND VAC RIGHT LOWER LEG on 12/20/201 SNF - OT/PT and wound care
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
## 2016-01-31 NOTE — Assessment & Plan Note (Signed)
SNF - colace regularly BID

## 2016-01-31 NOTE — Progress Notes (Addendum)
CONSULT NOTE  Pharmacy Consult for heparin, unasyn Indication: pulmonary embolus  Allergies  Allergen Reactions  . Morphine And Related Nausea And Vomiting    Elevated blood pressure    Patient Measurements: Height:  (170.2 cm) Weight: (!) 340 lb 2.7 oz (154.3 kg) IBW/kg (Calculated) : 66.1 Heparin Dosing Weight: 105kg  Vital Signs: Temp: 98.2 F (36.8 C) (02/10 0800) Temp Source: Oral (02/10 0800) BP: 132/97 mmHg (02/10 0800) Pulse Rate: 118 (02/10 0800)  Labs:  Recent Labs  01/29/16 2020  01/30/16 0020 01/30/16 0022 01/30/16 0320 01/30/16 1200 01/30/16 2307 01/31/16 0450  HGB 11.1*  < > 11.1* 13.6  --   --   --  8.1*  HCT 37.9*  < > 37.1* 40.0  --   --   --  26.2*  PLT 100*  --  141*  --   --   --   --  117*  APTT 62*  --   --   --   --   --   --   --   LABPROT 22.3*  --   --   --  25.2*  --   --   --   INR 1.97*  --   --   --  2.31*  --   --   --   HEPARINUNFRC  --   --   --   --   --  0.20* 0.38 0.40  CREATININE 1.70*  < > 1.69* 1.30*  --   --   --  2.91*  TROPONINI  --   --  3.96*  --   --   --   --   --   < > = values in this interval not displayed.  Estimated Creatinine Clearance: 50.3 mL/min (by C-G formula based on Cr of 2.91).    Scheduled:  . ampicillin-sulbactam (UNASYN) IV  3 g Intravenous 4 times per day  . antiseptic oral rinse  7 mL Mouth Rinse BID  . cholestyramine light  4 g Oral TID  . insulin aspart  0-5 Units Subcutaneous QHS  . insulin aspart  0-9 Units Subcutaneous TID WC  . pantoprazole (PROTONIX) IV  40 mg Intravenous QHS   Infusions:  . sodium chloride 10 mL/hr at 01/30/16 1930  . heparin 1,250 Units/hr (01/31/16 0046)    Assessment: 34yo male had ortho surgery and was to be released from rehab facility when pt began c/o SOB and had syncopal event, rehab facility called EMS, pt coded when arrived to ED. Patient received TNKase on 2/8 at 8pm for PE with right heart strain.  -Heparin level 0.4 (therapeutic) on 1250  units/hr -Hg 13.6>> 8.1, plt= 117  He is also on Unasyn D2 for aspiration PNA. WBC= 8.3, tmax= 99.7, PCT 7.74, LA 16>>5.8. -noted SCr 1.3>>2.91, CrCl ~ 50  2/9 resp cx  Goal of Therapy:  Heparin level 0.3-0.7 units/ml Monitor platelets by anticoagulation protocol: Yes   Plan:  -Continue unasyn 3gm IV q6h -Will recheck a CBC and a BMET  -Continue heparin at 1250 units/hr -Heparin level and CBC daily  Harland German, Pharm D 01/31/2016 8:38 AM   Addendum -Patient to begin apixiban for PE -SCr= 3.13 (upfrom this am); Hg= 7.5 (trend 12.6>>8.1>>7.5) -No bleeding noted. Also no bleeding noted per RN  Plan -Discussed with Dr. Delton Coombes: hold apixiban for now with renal function and hg trend -Continue heparin and will keep goal at 0.3-0.5  Harland German, Pharm D 01/31/2016 2:44 PM
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
## 2016-02-01 ENCOUNTER — Inpatient Hospital Stay (HOSPITAL_COMMUNITY): Payer: Medicaid Other

## 2016-02-01 DIAGNOSIS — D500 Iron deficiency anemia secondary to blood loss (chronic): Secondary | ICD-10-CM | POA: Insufficient documentation

## 2016-02-01 LAB — BASIC METABOLIC PANEL
ANION GAP: 10 (ref 5–15)
BUN: 20 mg/dL (ref 6–20)
CALCIUM: 7.7 mg/dL — AB (ref 8.9–10.3)
CHLORIDE: 105 mmol/L (ref 101–111)
CO2: 21 mmol/L — AB (ref 22–32)
Creatinine, Ser: 3.21 mg/dL — ABNORMAL HIGH (ref 0.61–1.24)
GFR calc non Af Amer: 23 mL/min — ABNORMAL LOW (ref >60)
GFR, EST AFRICAN AMERICAN: 27 mL/min — AB (ref >60)
Glucose, Bld: 150 mg/dL — ABNORMAL HIGH (ref 65–99)
Potassium: 4.5 mmol/L (ref 3.5–5.1)
Sodium: 136 mmol/L (ref 135–145)

## 2016-02-01 LAB — CBC
HCT: 22.3 % — ABNORMAL LOW (ref 39.0–52.0)
HEMATOCRIT: 17.7 % — AB (ref 39.0–52.0)
HEMOGLOBIN: 5.4 g/dL — AB (ref 13.0–17.0)
Hemoglobin: 7.1 g/dL — ABNORMAL LOW (ref 13.0–17.0)
MCH: 23.3 pg — ABNORMAL LOW (ref 26.0–34.0)
MCH: 24.8 pg — ABNORMAL LOW (ref 26.0–34.0)
MCHC: 30.5 g/dL (ref 30.0–36.0)
MCHC: 31.8 g/dL (ref 30.0–36.0)
MCV: 76.3 fL — ABNORMAL LOW (ref 78.0–100.0)
MCV: 78 fL (ref 78.0–100.0)
PLATELETS: 129 10*3/uL — AB (ref 150–400)
Platelets: 137 10*3/uL — ABNORMAL LOW (ref 150–400)
RBC: 2.32 MIL/uL — AB (ref 4.22–5.81)
RBC: 2.86 MIL/uL — AB (ref 4.22–5.81)
RDW: 14.2 % (ref 11.5–15.5)
RDW: 14.9 % (ref 11.5–15.5)
WBC: 9.7 10*3/uL (ref 4.0–10.5)
WBC: 9.2 10*3/uL (ref 4.0–10.5)

## 2016-02-01 LAB — HEMOGLOBIN AND HEMATOCRIT, BLOOD
HCT: 18.1 % — ABNORMAL LOW (ref 39.0–52.0)
HCT: 24.8 % — ABNORMAL LOW (ref 39.0–52.0)
HEMOGLOBIN: 5.7 g/dL — AB (ref 13.0–17.0)
Hemoglobin: 7.9 g/dL — ABNORMAL LOW (ref 13.0–17.0)

## 2016-02-01 LAB — GLUCOSE, CAPILLARY
GLUCOSE-CAPILLARY: 123 mg/dL — AB (ref 65–99)
GLUCOSE-CAPILLARY: 116 mg/dL — AB (ref 65–99)
Glucose-Capillary: 112 mg/dL — ABNORMAL HIGH (ref 65–99)

## 2016-02-01 LAB — PREPARE RBC (CROSSMATCH)

## 2016-02-01 LAB — C DIFFICILE QUICK SCREEN W PCR REFLEX
C DIFFICLE (CDIFF) ANTIGEN: NEGATIVE
C Diff interpretation: NEGATIVE
C Diff toxin: NEGATIVE

## 2016-02-01 LAB — HEPARIN LEVEL (UNFRACTIONATED): HEPARIN UNFRACTIONATED: 0.26 [IU]/mL — AB (ref 0.30–0.70)

## 2016-02-01 MED ORDER — MIDAZOLAM HCL 2 MG/2ML IJ SOLN
INTRAMUSCULAR | Status: AC
  Filled 2016-02-01: qty 2

## 2016-02-01 MED ORDER — LIDOCAINE HCL 1 % IJ SOLN
INTRAMUSCULAR | Status: AC
  Filled 2016-02-01: qty 20

## 2016-02-01 MED ORDER — FENTANYL CITRATE (PF) 100 MCG/2ML IJ SOLN
INTRAMUSCULAR | Status: AC
  Filled 2016-02-01: qty 2

## 2016-02-01 MED ORDER — ENSURE ENLIVE PO LIQD
237.0000 mL | Freq: Two times a day (BID) | ORAL | Status: DC
  Administered 2016-02-02 – 2016-02-05 (×4): 237 mL via ORAL

## 2016-02-01 MED ORDER — SODIUM CHLORIDE 0.9 % IV SOLN
Freq: Once | INTRAVENOUS | Status: AC
  Administered 2016-02-01: 18:00:00 via INTRAVENOUS

## 2016-02-01 MED ORDER — SODIUM CHLORIDE 0.9 % IV SOLN
INTRAVENOUS | Status: DC
  Administered 2016-02-01: 16:00:00 via INTRAVENOUS
  Administered 2016-02-02: 75 mL via INTRAVENOUS

## 2016-02-01 MED ORDER — MIDAZOLAM HCL 2 MG/2ML IJ SOLN
INTRAMUSCULAR | Status: AC | PRN
  Administered 2016-02-01: 1 mg via INTRAVENOUS

## 2016-02-01 MED ORDER — SODIUM CHLORIDE 0.9 % IV SOLN
Freq: Once | INTRAVENOUS | Status: AC
Start: 2016-02-01 — End: 2016-02-01
  Administered 2016-02-01: 13:00:00 via INTRAVENOUS

## 2016-02-01 MED ORDER — FENTANYL CITRATE (PF) 100 MCG/2ML IJ SOLN
INTRAMUSCULAR | Status: AC | PRN
  Administered 2016-02-01 (×2): 50 ug via INTRAVENOUS

## 2016-02-01 MED ORDER — SODIUM CHLORIDE 0.9 % IV SOLN
3.0000 g | Freq: Four times a day (QID) | INTRAVENOUS | Status: DC
  Administered 2016-02-01 – 2016-02-02 (×5): 3 g via INTRAVENOUS
  Filled 2016-02-01 (×7): qty 3

## 2016-02-01 NOTE — Procedures (Signed)
Successful placement of an infrarenal IVC filter.  EBL: None No immediate complications.   Jay Yousof Alderman, MD Pager #: [phone]    

## 2016-02-01 NOTE — Progress Notes (Signed)
Notified md of clarification for hgb recheck.  Last was 7.1 after 2 units PRBCs.  Received additional unit PRBC for total of 3.  Will recheck H&H and notify MD of results.  Will continue to monitor. Todd Parrish

## 2016-02-01 NOTE — Progress Notes (Addendum)
ANTICOAGULATION CONSULT NOTE - Follow Up Consult  Pharmacy Consult for heparin Indication: pulmonary embolus   Labs:  Recent Labs  01/29/16 2020  01/30/16 0020 01/30/16 0022 01/30/16 0320  01/30/16 2307 01/31/16 0450 01/31/16 1221 02/01/16 0552  HGB 11.1*  < > 11.1* 13.6  --   --   --  8.1* 7.5*  --   HCT 37.9*  < > 37.1* 40.0  --   --   --  26.2* 24.2*  --   PLT 100*  --  141*  --   --   --   --  117* 137*  --   APTT 62*  --   --   --   --   --   --   --   --   --   LABPROT 22.3*  --   --   --  25.2*  --   --   --   --   --   INR 1.97*  --   --   --  2.31*  --   --   --   --   --   HEPARINUNFRC  --   --   --   --   --   < > 0.38 0.40  --  0.26*  CREATININE 1.70*  < > 1.69* 1.30*  --   --   --  2.91* 3.13*  --   TROPONINI  --   --  3.96*  --   --   --   --   --   --   --   < > = values in this interval not displayed.   Assessment: 35yo male now subtherapeutic on heparin after two levels at goal.  Goal of Therapy:  Heparin level 0.3-0.5 units/ml   Plan:  Will increase heparin gtt by 1 unit/kg/hr to 1400 units/hr and check level in 6-8hr.  Vernard Gambles, PharmD, BCPS  02/01/2016,6:44 AM   ADDENDUM -------------------------------------------------------------------------------------- Heparin level originally scheduled for 1400, but by 1440 had not yet been drawn by lab. Pt also required blood transfusion and will need a repeat CBC, but at 1440 it was too early to draw the CBC as the infusion had just finished.   Per nurse, pt has been refusing blood draws and would likely refuse one of the two labs. Re-timed heparin level for 1600 to coincide with repeat CBC. Will adjust heparin as appropriate at that time.  Arcola Jansky, PharmD Clinical Pharmacy Resident Pager: [phone]

## 2016-02-01 NOTE — Progress Notes (Addendum)
[facility] PROGRESS NOTE  Ahad Colarusso Shimamoto ZOX:[id number] DOB: 06-Jan-1981 DOA: 01/29/2016 PCP: No PCP Per Patient  Assessment/Plan: Todd Parrish is a 35 y.o. male with PMH as outlined below and who had been in rehab facility since December 10, 2015 following ORIF of right bicondylar tibial plateau fx and right knee arthrotomy and fasciotomy. He had apparently been doing well at rehab and was being discharged on evening of 01/29/16. While in the car to go home, he suddenly had syncopal episode. Sister immediately called EMS and staff from rehab facility came out to help. Pt never lost pulses and regained consciousness immediately; however, had had an additional 2 syncopal episodes (he regained consciousness in between each time).  On EMS arrival, he was found to be hypoxic and after they got him into ambulance, he reportedly had 2 seizure like episodes and was gasping for breath stating that he could not breathe.  On ED arrival, he was significantly SOB and barely able to communicate. He then became agonal and had bradycardia. He was given narcan and had questionable response. He then became bradycardic and agonal again and was then noted to be pulseless.  He had brief episodes of PEA arrest (2 - 3 minutes before ROSC on each occasion). During these events, he was intubated for respiratory insufficiency and he was given TNKase push dose for presumed PE.  1-. Left pulmonary embolus submassive . VDRF - due to cardiac arrest., Mixed acidosis., Documented L PA PE - s/p TNKase in ED. R basilar atx / opacity, ? Consider PNA Continue with IV heparin.  Continue with IV antibiotics.   Addendum: called by Radiology, CT abdomen pelvis consistent with retroperitoneal hemorrhage.  Discussed with Dr Vaughan Basta: hold heparin, per Dr Jamison Neighbor note consider IVC filter. Dr Grace Isaac consulted for evaluation for consideration of IVC filter.    2-Acute blood loss anemia;  Getting 2 units of  PRBC.  CT abdomen ordered to rule out retroperitoneal bleed.  Occult blood ordered.   3-Diarrhea; C diff negative.   4-AKI, might be related to hypovolemia, hemodynamic, also received contrast for CT angio.  Cr 2 month ago was at 0.8. Cr on admission at 1.7.  Continue with IV fluids.  Monitor urine out put.   s/p PEA arrest - due to acute PE ECHO; normal EF.  Hypothermia protocol stopped due to improved MS Levophed off  Thrombocytopenia in setting acute clot.  Hx HIV - followed by Dr. Ninetta Lights. Last CD4 count 420 (January 2017). Leukocytosis - likely acute phase reactant, but consider aspiration. Assess CD4. Follow cultures as above. Empiric unasyn added 2/9  Hyperglycemia  SSI  Check HB A-1c.   Acute metabolic encephalopathy. ? Seizure disorder. Resolved.    Code Status: Full code.  Family Communication: care discussed with sister at bedside.  Disposition Plan: remain in the step down.    Consultants:  CCM, Pulmonary   Procedures:  Intubation 2-  ECHO pending.   Antibiotics:  Unasyn   HPI/Subjective:   Objective: Filed Vitals:   02/01/16 1215 02/01/16 1230  BP: 165/84 151/101  Pulse: 106 100  Temp:  98 F (36.7 C)  Resp: 24 20    Intake/Output Summary (Last 24 hours) at 02/01/16 1244 Last data filed at 02/01/16 1129  Gross per 24 hour  Intake 1289.73 ml  Output    200 ml  Net 1089.73 ml   Filed Weights   01/29/16 2320 01/30/16 0000 02/01/16 0400  Weight: 154.2 kg (339 lb 15.2 oz) 154.3 kg (  340 lb 2.7 oz) 155 kg (341 lb 11.4 oz)    Exam:   General:  Alert in no distress  Cardiovascular: S 1, S 2 RRR  Respiratory: CTA  Abdomen: BS present, soft, nt  Musculoskeletal: trace edema  Data Reviewed: Basic Metabolic Panel:  Recent Labs Lab 01/29/16 2020  01/30/16 0020 01/30/16 0022 01/31/16 0450 01/31/16 1221 02/01/16 0552  NA 141  < > 143 145 140 139 136  K 3.4*  < > 3.5 3.3* 4.1 4.5 4.5  CL 102  < > 110 106 109 108 105   CO2 14*  --  16*  --  21* 19* 21*  GLUCOSE 436*  < > 243* 234* 114* 138* 150*  BUN 8  < > CREATININE 1.70*  < > 1.69* 1.30* 2.91* 3.13* 3.21*  CALCIUM 8.5*  --  6.7*  --  7.8* 7.9* 7.7*  MG 2.5*  --  1.7  --   --   --   --   PHOS 12.0*  --  6.5*  --   --   --   --   < > = values in this interval not displayed. Liver Function Tests:  Recent Labs Lab 01/29/16 2020 01/31/16 0450  AST 201* 124*  ALT 210* 262*  ALKPHOS 73 67  BILITOT 0.5 0.4  PROT 6.0* 5.8*  ALBUMIN 2.7* 2.3*   No results for input(s): LIPASE, AMYLASE in the last 168 hours. No results for input(s): AMMONIA in the last 168 hours. CBC:  Recent Labs Lab 01/29/16 2020  01/30/16 0020 01/30/16 0022 01/31/16 0450 01/31/16 1221 02/01/16 0552 02/01/16 0731  WBC 19.3*  --  14.6*  --  8.3 9.5 9.7  --   NEUTROABS 8.0*  --   --   --  5.7  --   --   --   HGB 11.1*  < > 11.1* 13.6 8.1* 7.5* 5.4* 5.7*  HCT 37.9*  < > 37.1* 40.0 26.2* 24.2* 17.7* 18.1*  MCV 84.2  --  80.3  --  76.8* 77.1* 76.3*  --   PLT 100*  --  141*  --  117* 137* 137*  --   < > = values in this interval not displayed. Cardiac Enzymes:  Recent Labs Lab 01/30/16 0020  TROPONINI 3.96*   BNP (last 3 results)  Recent Labs  11/25/15 1134 01/29/16 2020  BNP 39.8 19.1    ProBNP (last 3 results) No results for input(s): PROBNP in the last 8760 hours.  CBG:  Recent Labs Lab 01/31/16 1136 01/31/16 1746 01/31/16 2113 02/01/16 0816 02/01/16 1136  GLUCAP 137* 131* 122* 112* 123*    Recent Results (from the past 240 hour(s))  Culture, respiratory (NON-Expectorated)     Status: None (Preliminary result)   Collection Time: 01/30/16  3:16 AM  Result Value Ref Range Status   Specimen Description TRACHEAL ASPIRATE  Final   Special Requests NONE  Final   Gram Stain   Final    ABUNDANT WBC PRESENT,BOTH PMN AND MONONUCLEAR RARE SQUAMOUS EPITHELIAL CELLS PRESENT ABUNDANT GRAM POSITIVE COCCI IN PAIRS FEW GRAM NEGATIVE  RODS Performed at [facility]    Culture   Final    Culture reincubated for better growth Performed at [facility]    Report Status PENDING  Incomplete  MRSA PCR Screening     Status: None   Collection Time: 01/31/16 10:45 AM  Result Value Ref Range Status   MRSA by  PCR NEGATIVE NEGATIVE Final    Comment:        The GeneXpert MRSA Assay (FDA approved for NASAL specimens only), is one component of a comprehensive MRSA colonization surveillance program. It is not intended to diagnose MRSA infection nor to guide or monitor treatment for MRSA infections.   C difficile quick scan w PCR reflex     Status: None   Collection Time: 01/31/16  7:03 PM  Result Value Ref Range Status   C Diff antigen NEGATIVE NEGATIVE Final   C Diff toxin NEGATIVE NEGATIVE Final   C Diff interpretation Negative for toxigenic C. difficile  Final     Studies: Dg Chest Port 1 View  01/31/2016  CLINICAL DATA:  Pulmonary embolism. Soreness in the chest, cardiopulmonary resuscitation last night at 6 p.m. EXAM: PORTABLE CHEST 1 VIEW COMPARISON:  01/30/2016 FINDINGS: Low lung volumes are present, causing crowding of the pulmonary vasculature. Indistinct airspace opacity in the medial basilar and perihilar regions potentially from atelectasis, pneumonia, or pulmonary hemorrhage. Heart size within normal limits for technique. No blunting of the costophrenic angles. The patient has been intervally extubated. IMPRESSION: 1. Continued right perihilar and medial basilar airspace opacity with some mild increase in left retrocardiac airspace opacity potentially from atelectasis, pneumonia, or pulmonary hemorrhage in the setting of the patient's known pulmonary embolus. 2. Low lung volumes are present, causing crowding of the pulmonary vasculature. 3. Interval extubation. Electronically Signed   By: Gaylyn Rong M.D.   On: 01/31/2016 06:46    Scheduled Meds: . sodium chloride   Intravenous Once  .  ampicillin-sulbactam (UNASYN) IV  3 g Intravenous Q6H  . antiseptic oral rinse  7 mL Mouth Rinse BID  . cholestyramine light  4 g Oral TID  . insulin aspart  0-5 Units Subcutaneous QHS  . insulin aspart  0-9 Units Subcutaneous TID WC  . pantoprazole  40 mg Oral Daily   Continuous Infusions: . sodium chloride 10 mL/hr at 02/01/16 1100  . heparin 1,400 Units/hr (02/01/16 1100)    Active Problems:   Cardiac arrest (HCC)   PEA (Pulseless electrical activity) (HCC)   Respiratory failure (HCC)   SOB (shortness of breath)   Metabolic acidosis, increased anion gap   AKI (acute kidney injury) (HCC)   Pulmonary embolism on left (HCC)   Pulmonary embolism (HCC)    Time spent: 35 minutes.     Hartley Barefoot A  [facility] Pager [phone]. If 7PM-7AM, please contact night-coverage at [email or web address], password [facility] 02/01/2016, 12:44 PM  LOS: 3 days
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
## 2016-02-01 NOTE — Progress Notes (Signed)
Inpatient Rehabilitation  PT has requested that pt. be screened for possible IP Rehab admission.  Would recommend IP Rehab consult to assess his appropriateness for CIR.  Family support appears to be limited per chart, thus requiring SNF for rehab prior to this admission.    Weldon Picking PT Inpatient Rehab Admissions Coordinator Cell [phone] Office [phone]

## 2016-02-01 NOTE — Sedation Documentation (Signed)
Patient denies pain and is resting comfortably.  

## 2016-02-01 NOTE — Progress Notes (Signed)
CRITICAL VALUE ALERT  Critical value received:  Hgb 5.4  Date of notification:  02/01/2016  Time of notification:  06:47  Critical value read back:Yes.     Nurse who received alert:  Devonne Doughty  MD notified (1st page):  Jamison Neighbor  Time of first page:  06:49  Responding MD:  Jamison Neighbor  Time MD responded:  06:52   Stanford Breed, RN 02/01/2016
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
## 2016-02-02 DIAGNOSIS — K661 Hemoperitoneum: Secondary | ICD-10-CM

## 2016-02-02 DIAGNOSIS — J9601 Acute respiratory failure with hypoxia: Secondary | ICD-10-CM

## 2016-02-02 DIAGNOSIS — I2602 Saddle embolus of pulmonary artery with acute cor pulmonale: Secondary | ICD-10-CM

## 2016-02-02 LAB — BASIC METABOLIC PANEL
ANION GAP: 11 (ref 5–15)
BUN: 20 mg/dL (ref 6–20)
CALCIUM: 7.9 mg/dL — AB (ref 8.9–10.3)
CO2: 21 mmol/L — ABNORMAL LOW (ref 22–32)
Chloride: 109 mmol/L (ref 101–111)
Creatinine, Ser: 2.49 mg/dL — ABNORMAL HIGH (ref 0.61–1.24)
GFR calc Af Amer: 37 mL/min — ABNORMAL LOW (ref >60)
GFR, EST NON AFRICAN AMERICAN: 32 mL/min — AB (ref >60)
Glucose, Bld: 103 mg/dL — ABNORMAL HIGH (ref 65–99)
POTASSIUM: 4.4 mmol/L (ref 3.5–5.1)
SODIUM: 141 mmol/L (ref 135–145)

## 2016-02-02 LAB — CBC
HCT: 24 % — ABNORMAL LOW (ref 39.0–52.0)
Hemoglobin: 7.7 g/dL — ABNORMAL LOW (ref 13.0–17.0)
MCH: 25.3 pg — ABNORMAL LOW (ref 26.0–34.0)
MCHC: 32.1 g/dL (ref 30.0–36.0)
MCV: 78.9 fL (ref 78.0–100.0)
PLATELETS: 129 10*3/uL — AB (ref 150–400)
RBC: 3.04 MIL/uL — AB (ref 4.22–5.81)
RDW: 15.3 % (ref 11.5–15.5)
WBC: 8 10*3/uL (ref 4.0–10.5)

## 2016-02-02 LAB — CULTURE, RESPIRATORY

## 2016-02-02 LAB — GLUCOSE, CAPILLARY
GLUCOSE-CAPILLARY: 104 mg/dL — AB (ref 65–99)
GLUCOSE-CAPILLARY: 86 mg/dL (ref 65–99)
GLUCOSE-CAPILLARY: 104 mg/dL — AB (ref 65–99)
Glucose-Capillary: 97 mg/dL (ref 65–99)
Glucose-Capillary: 97 mg/dL (ref 65–99)

## 2016-02-02 LAB — CULTURE, RESPIRATORY W GRAM STAIN

## 2016-02-02 MED ORDER — CEFAZOLIN SODIUM-DEXTROSE 2-3 GM-% IV SOLR
2.0000 g | Freq: Three times a day (TID) | INTRAVENOUS | Status: DC
Start: 2016-02-02 — End: 2016-02-03
  Administered 2016-02-02 – 2016-02-03 (×4): 2 g via INTRAVENOUS
  Filled 2016-02-02 (×6): qty 50

## 2016-02-02 MED ORDER — MECLIZINE HCL 25 MG PO TABS
25.0000 mg | ORAL_TABLET | Freq: Two times a day (BID) | ORAL | Status: DC | PRN
  Administered 2016-02-02: 25 mg via ORAL
  Filled 2016-02-02: qty 2
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
## 2016-02-02 NOTE — Progress Notes (Signed)
Notified md of pt's lab H&H 7.9,24.8.  Will continue to monitor. Todd Parrish

## 2016-02-02 NOTE — Progress Notes (Signed)
Pharmacy Antibiotic Note  Todd Parrish is a 35 y.o. male admitted on 01/29/2016 with MSSA  pneumonia.  Pharmacy has been consulted for Ancef dosing. PT previously received 3 days of unasyn. Will start ancef with a stop date of 2/15.   WBC= 9.7>8, tmax= 98.8, PCT 7.74, LA 16>>5.8  Plan: Discontinue Unasyn  Ancef 2g IV Q8h (stop date 2/15 F/U renal function, clinical course   Height:  (170.2 cm) Weight: (!) 341 lb 11.4 oz (155 kg) IBW/kg (Calculated) : 66.1  Temp (24hrs), Avg:98.7 F (37.1 C), Min:98.2 F (36.8 C), Max:99 F (37.2 C)   Recent Labs Lab 01/29/16 2024 01/30/16 0020 01/30/16 0022 01/30/16 0320 01/31/16 0450 01/31/16 1221 02/01/16 0552 02/01/16 1714 02/02/16 0536  WBC  --  14.6*  --   --  8.3 9.5 9.7 9.2 8.0  CREATININE 1.30* 1.69* 1.30*  --  2.91* 3.13* 3.21*  --  2.49*  LATICACIDVEN 16.73* 7.5*  --  5.8*  --   --   --   --   --     Estimated Creatinine Clearance: 59 mL/min (by C-G formula based on Cr of 2.49).    Allergies  Allergen Reactions  . Morphine And Related Nausea And Vomiting    Elevated blood pressure    Antimicrobials this admission: 2/9>>unasyn>>2/12  2/12>>Ancef>>[stop date 2/15]  Microbiology results: 2/9 resp>>MSSA  2/11 C diff>> negative  Thank you for allowing pharmacy to be a part of this patient's care.  Todd Parrish C. Marvis Moeller, PharmD Pharmacy Resident  Pager: [phone] 02/02/2016 1:22 PM

## 2016-02-02 NOTE — Progress Notes (Signed)
Pt reporting some dizziness. VSS. Blood sugar WNL. Pt states he "has vertigo and takes meclizine at home for this". Dr. David Stall paged awaiting response.
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
## 2016-02-02 NOTE — Progress Notes (Signed)
Spoke with Dr. David Stall about pt dizziness again. Meclizine reordered. VSS. Physician states it is ok to move him to telemetry.

## 2016-02-03 DIAGNOSIS — J9601 Acute respiratory failure with hypoxia: Secondary | ICD-10-CM

## 2016-02-03 LAB — CBC
HEMATOCRIT: 24.8 % — AB (ref 39.0–52.0)
HEMOGLOBIN: 8 g/dL — AB (ref 13.0–17.0)
MCH: 26.1 pg (ref 26.0–34.0)
MCHC: 32.3 g/dL (ref 30.0–36.0)
MCV: 80.8 fL (ref 78.0–100.0)
Platelets: 134 10*3/uL — ABNORMAL LOW (ref 150–400)
RBC: 3.07 MIL/uL — ABNORMAL LOW (ref 4.22–5.81)
RDW: 15.5 % (ref 11.5–15.5)
WBC: 6.7 10*3/uL (ref 4.0–10.5)

## 2016-02-03 LAB — GLUCOSE, CAPILLARY
GLUCOSE-CAPILLARY: 100 mg/dL — AB (ref 65–99)
GLUCOSE-CAPILLARY: 96 mg/dL (ref 65–99)
Glucose-Capillary: 90 mg/dL (ref 65–99)
Glucose-Capillary: 101 mg/dL — ABNORMAL HIGH (ref 65–99)

## 2016-02-03 LAB — HEMOGLOBIN A1C
HEMOGLOBIN A1C: 6 % — AB (ref 4.8–5.6)
MEAN PLASMA GLUCOSE: 126 mg/dL

## 2016-02-03 MED ORDER — ACETAMINOPHEN 325 MG PO TABS
325.0000 mg | ORAL_TABLET | Freq: Four times a day (QID) | ORAL | Status: DC | PRN
  Administered 2016-02-03 – 2016-02-06 (×7): 325 mg via ORAL
  Filled 2016-02-03 (×6): qty 1

## 2016-02-03 MED ORDER — DOXYCYCLINE HYCLATE 100 MG PO TABS
100.0000 mg | ORAL_TABLET | Freq: Two times a day (BID) | ORAL | Status: DC
  Administered 2016-02-03 – 2016-02-06 (×6): 100 mg via ORAL
  Filled 2016-02-03 (×6): qty 1
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
## 2016-02-03 NOTE — Progress Notes (Signed)
Utilization review completed.  
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
## 2016-02-04 LAB — BASIC METABOLIC PANEL
ANION GAP: 10 (ref 5–15)
BUN: 15 mg/dL (ref 6–20)
CALCIUM: 8.6 mg/dL — AB (ref 8.9–10.3)
CHLORIDE: 108 mmol/L (ref 101–111)
CO2: 22 mmol/L (ref 22–32)
Creatinine, Ser: 1.71 mg/dL — ABNORMAL HIGH (ref 0.61–1.24)
GFR calc non Af Amer: 50 mL/min — ABNORMAL LOW (ref >60)
GFR, EST AFRICAN AMERICAN: 58 mL/min — AB (ref >60)
GLUCOSE: 92 mg/dL (ref 65–99)
POTASSIUM: 4.2 mmol/L (ref 3.5–5.1)
Sodium: 140 mmol/L (ref 135–145)

## 2016-02-04 LAB — URINE MICROSCOPIC-ADD ON

## 2016-02-04 LAB — CBC
HEMATOCRIT: 25.4 % — AB (ref 39.0–52.0)
HEMOGLOBIN: 7.8 g/dL — AB (ref 13.0–17.0)
MCH: 24.8 pg — ABNORMAL LOW (ref 26.0–34.0)
MCHC: 30.7 g/dL (ref 30.0–36.0)
MCV: 80.6 fL (ref 78.0–100.0)
Platelets: 167 10*3/uL (ref 150–400)
RBC: 3.15 MIL/uL — AB (ref 4.22–5.81)
RDW: 15.6 % — AB (ref 11.5–15.5)
WBC: 9 10*3/uL (ref 4.0–10.5)

## 2016-02-04 LAB — URINALYSIS, ROUTINE W REFLEX MICROSCOPIC
BILIRUBIN URINE: NEGATIVE
Glucose, UA: NEGATIVE mg/dL
Ketones, ur: NEGATIVE mg/dL
NITRITE: NEGATIVE
PH: 5.5 (ref 5.0–8.0)
Protein, ur: 30 mg/dL — AB
SPECIFIC GRAVITY, URINE: 1.01 (ref 1.005–1.030)

## 2016-02-04 LAB — GLUCOSE, CAPILLARY: GLUCOSE-CAPILLARY: 102 mg/dL — AB (ref 65–99)

## 2016-02-04 LAB — MAGNESIUM: Magnesium: 1.9 mg/dL (ref 1.7–2.4)
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
## 2016-02-04 NOTE — Progress Notes (Signed)
   Name: Todd Parrish MRN: [id number] DOB: January 14, 1981    ADMISSION DATE:  01/29/2016 CONSULTATION DATE:  01/29/2016  REFERRING MD :  ED  CHIEF COMPLAINT:  SOB.   SUBJECTIVE: Doing well overall. Denies dyspnea. He has occasional chest pain but none presently. Occasional cough.  VITAL SIGNS: Temp:  [98 F (36.7 C)-98.7 F (37.1 C)] 98.7 F (37.1 C) (02/14 0600) Pulse Rate:  [81-85] 85 (02/14 0600) Resp:  [17-18] 18 (02/14 0600) BP: (138-160)/(63-77) 138/77 mmHg (02/14 0600) SpO2:  [98 %-100 %] 100 % (02/14 0600)  PHYSICAL EXAMINATION:  Vitals:  Filed Vitals:   02/03/16 0507 02/03/16 1406 02/03/16 2041 02/04/16 0600  BP: 144/70 146/63 160/75 138/77  Pulse: 86 81 84 85  Temp: 98.8 F (37.1 C) 98 F (36.7 C)  98.7 F (37.1 C)  TempSrc: Oral Oral  Oral  Resp:  Height:      Weight:      SpO2: 100% 98% 100% 100%   General Apperance: NAD HEENT: Normocephalic, atraumatic, anicteric sclera Neck: Supple, trachea midline Lungs: Clear to auscultation bilaterally. No wheezes, rhonchi or rales. Breathing comfortably Heart: Regular rate and rhythm, no murmur/rub/gallop Abdomen: Soft, nontender, nondistended, no rebound/guarding Extremities: Warm and well perfused, no edema Skin: No rashes or lesions Neurologic: Alert and interactive. No gross deficits.   Recent Labs Lab 02/01/16 0552 02/02/16 0536 02/04/16 0250  NA 136 141 140  K 4.5 4.4 4.2  CL 105 109 108  CO2 21* 21* 22  BUN CREATININE 3.21* 2.49* 1.71*  GLUCOSE 150* 103* 92    Recent Labs Lab 02/02/16 0536 02/03/16 0700 02/04/16 0250  HGB 7.7* 8.0* 7.8*  HCT 24.0* 24.8* 25.4*  WBC 8.0 6.7 9.0  PLT 129* 134* 167   No results found.  ASSESSMENT / PLAN:  35 year old man who had been in rehab facility 12/10/2015 to 01/29/2016 following ORIF of right bicondylar tibial plateau fx and right knee arthrotomy and fasciotomy presenting after syncope, hypoxia and seizure like activity,  found to have submassive PE (LLL). Was given heparin. Had RP bleeed with heparin. Heparin dc'd and IVC filter placed.   Submassive Pulmonary Embolus: s/p IVC filter  Retroperitoneal Bleed: Hgb stable at 7.8 from 8 yesterday.  Acute Hypoxemic Respiratory Failure -Wean O2 as tolerated -On doxycycline for possible aspiration pneumonia -Continue pulm toilet -OOB, ambulate as tolerated  Hyperglycemia, Prediabetes: Hyperglycemic on admission. CBG within normal limits for past 48 hours and has not required any insulin. No history of diabetes. Hemoglobin A1c 6. -Discontinue CBG checks  Griffin Basil, MD  Internal Medicine PGY-2  Pulmonary and Critical Care Medicine [facility] Pager: [phone] 02/04/2016, 9:30 AM

## 2016-02-04 NOTE — Progress Notes (Signed)
CSW following for potential placement for continued therapy. LOG approved for 2 weeks therapy and room and board if patient is unable to pay privately- [facility] rehab where pt was before anticipates being able to take him back if needed.  CSW will continue to follow  Merlyn Lot, [facility] Clinical Social Worker [phone]

## 2016-02-05 ENCOUNTER — Inpatient Hospital Stay: Payer: Self-pay | Admitting: Family Medicine

## 2016-02-05 DIAGNOSIS — D62 Acute posthemorrhagic anemia: Secondary | ICD-10-CM

## 2016-02-05 DIAGNOSIS — R42 Dizziness and giddiness: Secondary | ICD-10-CM | POA: Insufficient documentation

## 2016-02-05 DIAGNOSIS — R58 Hemorrhage, not elsewhere classified: Secondary | ICD-10-CM | POA: Insufficient documentation

## 2016-02-05 DIAGNOSIS — R7303 Prediabetes: Secondary | ICD-10-CM | POA: Insufficient documentation

## 2016-02-05 DIAGNOSIS — B20 Human immunodeficiency virus [HIV] disease: Secondary | ICD-10-CM

## 2016-02-05 DIAGNOSIS — I8290 Acute embolism and thrombosis of unspecified vein: Secondary | ICD-10-CM | POA: Insufficient documentation

## 2016-02-05 LAB — TYPE AND SCREEN
ABO/RH(D): O POS
Antibody Screen: NEGATIVE
UNIT DIVISION: 0
UNIT DIVISION: 0
UNIT DIVISION: 0
Unit division: 0

## 2016-02-05 LAB — BASIC METABOLIC PANEL
ANION GAP: 9 (ref 5–15)
BUN: 14 mg/dL (ref 6–20)
CALCIUM: 8.5 mg/dL — AB (ref 8.9–10.3)
CO2: 23 mmol/L (ref 22–32)
CREATININE: 1.44 mg/dL — AB (ref 0.61–1.24)
Chloride: 106 mmol/L (ref 101–111)
GFR calc non Af Amer: >60 mL/min (ref >60)
Glucose, Bld: 108 mg/dL — ABNORMAL HIGH (ref 65–99)
Potassium: 4 mmol/L (ref 3.5–5.1)
SODIUM: 138 mmol/L (ref 135–145)

## 2016-02-05 LAB — CBC
HCT: 26.7 % — ABNORMAL LOW (ref 39.0–52.0)
HEMOGLOBIN: 8.2 g/dL — AB (ref 13.0–17.0)
MCH: 24.6 pg — ABNORMAL LOW (ref 26.0–34.0)
MCHC: 30.7 g/dL (ref 30.0–36.0)
MCV: 79.9 fL (ref 78.0–100.0)
Platelets: 227 10*3/uL (ref 150–400)
RBC: 3.34 MIL/uL — ABNORMAL LOW (ref 4.22–5.81)
RDW: 15.5 % (ref 11.5–15.5)
WBC: 10 10*3/uL (ref 4.0–10.5)

## 2016-02-05 NOTE — [place] FL2 (Signed)
[place] MEDICAID FL2 LEVEL OF CARE SCREENING TOOL     IDENTIFICATION  Patient Name: Todd Parrish Birthdate: 1981-02-01 Sex: male Admission Date (Current Location): 01/29/2016  [facility] and [place] Number:  Producer, television/film/video and Address:  [facility] [place], [address] [place], [place] [address]      Provider Number: [id number]  Attending Physician Name and Address:  Alba Cory, MD  Relative Name and Phone Number:  Patient's sister Amy [phone]    Current Level of Care: Hospital Recommended Level of Care: Skilled Nursing Facility Prior Approval Number:    Date Approved/Denied:   PASRR Number:    Discharge Plan: SNF    Current Diagnoses: Patient Active Problem List   Diagnosis Date Noted  . Blood loss anemia   . PEA (Pulseless electrical activity) (HCC)   . Respiratory failure (HCC)   . SOB (shortness of breath)   . Metabolic acidosis, increased anion gap   . AKI (acute kidney injury) (HCC)   . Pulmonary embolism on left (HCC)   . Pulmonary embolism (HCC)   . Cardiac arrest (HCC) 01/29/2016  . Neuropathy (HCC) 12/23/2015  . Benign paroxysmal positional vertigo 12/23/2015  . Morbid obesity (HCC) 12/12/2015  . Constipation due to opioid therapy 12/03/2015  . Seasonal allergies 12/03/2015  . Human immunodeficiency virus (HIV) disease (HCC) 11/30/2015  . Left inguinal hernia   . Right medial tibial plateau fracture 11/25/2015    Orientation RESPIRATION BLADDER Height & Weight     Self, Time, Situation, Place  Normal Continent Weight: (!) 341 lb 11.4 oz (155 kg) Height:   (170.2 cm)  BEHAVIORAL SYMPTOMS/MOOD NEUROLOGICAL BOWEL NUTRITION STATUS      Continent Diet (cardiac)  AMBULATORY STATUS COMMUNICATION OF NEEDS Skin   Extensive Assist Verbally Surgical wounds                       Personal Care Assistance Level of Assistance  Bathing, Dressing Bathing Assistance: Maximum assistance Feeding  assistance: Independent Dressing Assistance: Maximum assistance     Functional Limitations Info             SPECIAL CARE FACTORS FREQUENCY  PT (By licensed PT), OT (By licensed OT)     PT Frequency: 5/wk OT Frequency: 5/wk            Contractures      Additional Factors Info  Code Status, Allergies Code Status Info: FULL Allergies Info: morphine and related           Current Medications (02/05/2016):  This is the current hospital active medication list Current Facility-Administered Medications  Medication Dose Route Frequency Provider Last Rate Last Dose  . 0.9 %  sodium chloride infusion   Intravenous Continuous Leslye Peer, MD   Stopped at 02/01/16 2132  . acetaminophen (TYLENOL) tablet 325 mg  325 mg Oral Q6H PRN Albertine Grates, MD   325 mg at 02/04/16 2001  . antiseptic oral rinse (CPC / CETYLPYRIDINIUM CHLORIDE 0.05%) solution 7 mL  7 mL Mouth Rinse BID Nelda Bucks, MD   7 mL at 02/04/16 1114  . doxycycline (VIBRA-TABS) tablet 100 mg  100 mg Oral Q12H Albertine Grates, MD   100 mg at 02/04/16 2103  . feeding supplement (ENSURE ENLIVE) (ENSURE ENLIVE) liquid 237 mL  237 mL Oral BID BM Belkys A Regalado, MD   237 mL at 02/03/16 1436  . HYDROcodone-acetaminophen (NORCO) 10-325 MG per tablet 1 tablet  1 tablet Oral Q6H PRN Leslye Peer, MD   1 tablet at 02/04/16 2258  . meclizine (ANTIVERT) tablet 25 mg  25 mg Oral Q12H PRN Marinda Elk, MD   25 mg at 02/02/16 1500  . ondansetron (ZOFRAN) injection 4 mg  4 mg Intravenous Q6H PRN Erin Fulling, MD   4 mg at 01/30/16 0135  . pantoprazole (PROTONIX) EC tablet 40 mg  40 mg Oral Daily Leslye Peer, MD   40 mg at 02/04/16 1114     Discharge Medications: Please see discharge summary for a list of discharge medications.  Relevant Imaging Results:  Relevant Lab Results:   Additional Information Social Security #: [id number]  Izora Ribas, [place]
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
## 2016-02-05 NOTE — Progress Notes (Signed)
Rehab admissions - I am following along for potential acute inpatient rehab admission.  I will see patient in am.  Call me for questions.  #[phone]
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
## 2016-02-06 ENCOUNTER — Inpatient Hospital Stay (HOSPITAL_COMMUNITY)
Admission: AD | Admit: 2016-02-06 | Discharge: 2016-02-20 | DRG: 977 | Disposition: A | Discharge status: Home / Self Care | Payer: Medicaid Other | Source: Intra-hospital | Attending: Physical Medicine & Rehabilitation | Admitting: Physical Medicine & Rehabilitation

## 2016-02-06 ENCOUNTER — Encounter (HOSPITAL_COMMUNITY): Payer: Self-pay | Admitting: Physical Medicine & Rehabilitation

## 2016-02-06 DIAGNOSIS — I2699 Other pulmonary embolism without acute cor pulmonale: Secondary | ICD-10-CM | POA: Diagnosis present

## 2016-02-06 DIAGNOSIS — S82143A Displaced bicondylar fracture of unspecified tibia, initial encounter for closed fracture: Secondary | ICD-10-CM | POA: Diagnosis present

## 2016-02-06 DIAGNOSIS — S82141D Displaced bicondylar fracture of right tibia, subsequent encounter for closed fracture with routine healing: Secondary | ICD-10-CM | POA: Diagnosis not present

## 2016-02-06 DIAGNOSIS — R7303 Prediabetes: Secondary | ICD-10-CM | POA: Diagnosis present

## 2016-02-06 DIAGNOSIS — R5381 Other malaise: Secondary | ICD-10-CM | POA: Diagnosis present

## 2016-02-06 DIAGNOSIS — K4090 Unilateral inguinal hernia, without obstruction or gangrene, not specified as recurrent: Secondary | ICD-10-CM | POA: Diagnosis present

## 2016-02-06 DIAGNOSIS — Z885 Allergy status to narcotic agent status: Secondary | ICD-10-CM

## 2016-02-06 DIAGNOSIS — N390 Urinary tract infection, site not specified: Secondary | ICD-10-CM | POA: Diagnosis not present

## 2016-02-06 DIAGNOSIS — A419 Sepsis, unspecified organism: Secondary | ICD-10-CM | POA: Diagnosis not present

## 2016-02-06 DIAGNOSIS — B9620 Unspecified Escherichia coli [E. coli] as the cause of diseases classified elsewhere: Secondary | ICD-10-CM | POA: Diagnosis not present

## 2016-02-06 DIAGNOSIS — N179 Acute kidney failure, unspecified: Secondary | ICD-10-CM | POA: Diagnosis present

## 2016-02-06 DIAGNOSIS — D62 Acute posthemorrhagic anemia: Secondary | ICD-10-CM | POA: Diagnosis present

## 2016-02-06 DIAGNOSIS — J690 Pneumonitis due to inhalation of food and vomit: Secondary | ICD-10-CM | POA: Diagnosis present

## 2016-02-06 DIAGNOSIS — B20 Human immunodeficiency virus [HIV] disease: Secondary | ICD-10-CM | POA: Diagnosis present

## 2016-02-06 DIAGNOSIS — Z6841 Body Mass Index (BMI) 40.0 and over, adult: Secondary | ICD-10-CM

## 2016-02-06 DIAGNOSIS — Z8674 Personal history of sudden cardiac arrest: Secondary | ICD-10-CM

## 2016-02-06 DIAGNOSIS — K219 Gastro-esophageal reflux disease without esophagitis: Secondary | ICD-10-CM | POA: Diagnosis present

## 2016-02-06 DIAGNOSIS — I10 Essential (primary) hypertension: Secondary | ICD-10-CM | POA: Diagnosis present

## 2016-02-06 DIAGNOSIS — K130 Diseases of lips: Secondary | ICD-10-CM | POA: Diagnosis not present

## 2016-02-06 DIAGNOSIS — R350 Frequency of micturition: Secondary | ICD-10-CM | POA: Diagnosis not present

## 2016-02-06 LAB — BASIC METABOLIC PANEL
Anion gap: 9 (ref 5–15)
BUN: 12 mg/dL (ref 6–20)
CALCIUM: 8.6 mg/dL — AB (ref 8.9–10.3)
CHLORIDE: 107 mmol/L (ref 101–111)
CO2: 24 mmol/L (ref 22–32)
CREATININE: 1.34 mg/dL — AB (ref 0.61–1.24)
GFR calc Af Amer: >60 mL/min (ref >60)
GFR calc non Af Amer: >60 mL/min (ref >60)
GLUCOSE: 101 mg/dL — AB (ref 65–99)
Potassium: 4.1 mmol/L (ref 3.5–5.1)
Sodium: 140 mmol/L (ref 135–145)

## 2016-02-06 LAB — CBC
HEMATOCRIT: 26.8 % — AB (ref 39.0–52.0)
HEMOGLOBIN: 8.3 g/dL — AB (ref 13.0–17.0)
MCH: 24.5 pg — ABNORMAL LOW (ref 26.0–34.0)
MCHC: 31 g/dL (ref 30.0–36.0)
MCV: 79.1 fL (ref 78.0–100.0)
Platelets: 282 10*3/uL (ref 150–400)
RBC: 3.39 MIL/uL — ABNORMAL LOW (ref 4.22–5.81)
RDW: 15.7 % — AB (ref 11.5–15.5)
WBC: 9.1 10*3/uL (ref 4.0–10.5)

## 2016-02-06 MED ORDER — DOXYCYCLINE HYCLATE 100 MG PO TABS: 100.0000 mg | ORAL_TABLET | Freq: Two times a day (BID) | ORAL | Status: DC

## 2016-02-06 MED ORDER — PROCHLORPERAZINE 25 MG RE SUPP: 12.5000 mg | Freq: Four times a day (QID) | RECTAL | Status: DC | PRN

## 2016-02-06 MED ORDER — BISACODYL 10 MG RE SUPP: 10.0000 mg | Freq: Every day | RECTAL | Status: DC | PRN

## 2016-02-06 MED ORDER — GUAIFENESIN-DM 100-10 MG/5ML PO SYRP: 5.0000 mL | ORAL_SOLUTION | Freq: Four times a day (QID) | ORAL | Status: DC | PRN

## 2016-02-06 MED ORDER — FLEET ENEMA 7-19 GM/118ML RE ENEM: 1.0000 | ENEMA | Freq: Once | RECTAL | Status: DC | PRN

## 2016-02-06 MED ORDER — PROCHLORPERAZINE MALEATE 5 MG PO TABS: 5.0000 mg | ORAL_TABLET | Freq: Four times a day (QID) | ORAL | Status: DC | PRN

## 2016-02-06 MED ORDER — SENNOSIDES-DOCUSATE SODIUM 8.6-50 MG PO TABS: 2.0000 | ORAL_TABLET | Freq: Every evening | ORAL | Status: DC | PRN

## 2016-02-06 MED ORDER — PANTOPRAZOLE SODIUM 40 MG PO TBEC
40.0000 mg | DELAYED_RELEASE_TABLET | Freq: Every day | ORAL | Status: DC
  Administered 2016-02-07 – 2016-02-20 (×14): 40 mg via ORAL
  Filled 2016-02-06 (×14): qty 1

## 2016-02-06 MED ORDER — TRAZODONE HCL 50 MG PO TABS: 25.0000 mg | ORAL_TABLET | Freq: Every evening | ORAL | Status: DC | PRN

## 2016-02-06 MED ORDER — ACETAMINOPHEN 325 MG PO TABS
325.0000 mg | ORAL_TABLET | ORAL | Status: DC | PRN
  Administered 2016-02-07 – 2016-02-08 (×2): 650 mg via ORAL
  Administered 2016-02-09: 325 mg via ORAL
  Administered 2016-02-09 – 2016-02-14 (×3): 650 mg via ORAL
  Filled 2016-02-06 (×6): qty 2
  Filled 2016-02-06: qty 1

## 2016-02-06 MED ORDER — POLYSACCHARIDE IRON COMPLEX 150 MG PO CAPS
150.0000 mg | ORAL_CAPSULE | Freq: Two times a day (BID) | ORAL | Status: DC
  Administered 2016-02-06 – 2016-02-20 (×28): 150 mg via ORAL
  Filled 2016-02-06 (×28): qty 1

## 2016-02-06 MED ORDER — ALUM & MAG HYDROXIDE-SIMETH 200-200-20 MG/5ML PO SUSP: 30.0000 mL | ORAL | Status: DC | PRN

## 2016-02-06 MED ORDER — CETYLPYRIDINIUM CHLORIDE 0.05 % MT LIQD
7.0000 mL | Freq: Two times a day (BID) | OROMUCOSAL | Status: DC
  Administered 2016-02-07 – 2016-02-19 (×8): 7 mL via OROMUCOSAL

## 2016-02-06 MED ORDER — ENSURE ENLIVE PO LIQD: 237.0000 mL | Freq: Two times a day (BID) | ORAL | Status: DC

## 2016-02-06 MED ORDER — PROCHLORPERAZINE EDISYLATE 5 MG/ML IJ SOLN: 5.0000 mg | Freq: Four times a day (QID) | INTRAMUSCULAR | Status: DC | PRN

## 2016-02-06 MED ORDER — TRAMADOL HCL 50 MG PO TABS
50.0000 mg | ORAL_TABLET | Freq: Four times a day (QID) | ORAL | Status: DC | PRN
  Administered 2016-02-07 (×3): 50 mg via ORAL
  Filled 2016-02-06 (×5): qty 1

## 2016-02-06 MED ORDER — DOXYCYCLINE HYCLATE 100 MG PO TABS
100.0000 mg | ORAL_TABLET | Freq: Two times a day (BID) | ORAL | Status: AC
  Administered 2016-02-06 – 2016-02-07 (×3): 100 mg via ORAL
  Filled 2016-02-06 (×3): qty 1

## 2016-02-06 MED ORDER — ACETAMINOPHEN 325 MG PO TABS: 325.0000 mg | ORAL_TABLET | Freq: Four times a day (QID) | ORAL | Status: DC | PRN

## 2016-02-06 MED ORDER — DIPHENHYDRAMINE HCL 12.5 MG/5ML PO ELIX: 12.5000 mg | ORAL_SOLUTION | Freq: Four times a day (QID) | ORAL | Status: DC | PRN

## 2016-02-06 MED ORDER — METHOCARBAMOL 500 MG PO TABS
500.0000 mg | ORAL_TABLET | Freq: Four times a day (QID) | ORAL | Status: DC | PRN
  Filled 2016-02-06: qty 1

## 2016-02-06 MED ORDER — ENSURE ENLIVE PO LIQD
237.0000 mL | Freq: Two times a day (BID) | ORAL | Status: DC
  Administered 2016-02-07: 237 mL via ORAL

## 2016-02-06 NOTE — Discharge Summary (Signed)
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 colonization surveillance program. It is not intended to diagnose MRSA infection nor to guide or monitor treatment for MRSA infections.   C difficile quick scan w PCR reflex     Status: None   Collection Time: 01/31/16  7:03 PM  Result Value Ref Range Status   C Diff antigen NEGATIVE NEGATIVE Final   C Diff toxin NEGATIVE NEGATIVE Final   C Diff interpretation Negative for toxigenic C. difficile  Final     Labs: Basic Metabolic Panel:  Recent Labs Lab 02/01/16 0552 02/02/16 0536 02/04/16 0250 02/05/16 0605 02/06/16 0350  NA 136 141 140 138 140  K 4.5 4.4 4.2 4.0 4.1  CL 105 109 108 106 107  CO2 21* 21* '22 23 24  ' GLUCOSE 150* 103* 92 108* 101*  BUN '20 20 15 14 12  ' CREATININE 3.21* 2.49* 1.71* 1.44* 1.34*  CALCIUM 7.7* 7.9* 8.6* 8.5* 8.6*  MG  --   --  1.9  --   --    Liver Function Tests:  Recent Labs Lab 01/31/16 0450  AST 124*  ALT 262*  ALKPHOS 67  BILITOT 0.4  PROT 5.8*  ALBUMIN 2.3*   No results for input(s): LIPASE, AMYLASE in the last 168 hours. No  results for input(s): AMMONIA in the last 168 hours. CBC:  Recent Labs Lab 01/31/16 0450  02/02/16 0536 02/03/16 0700 02/04/16 0250 02/05/16 0605 02/06/16 0350  WBC 8.3  < > 8.0 6.7 9.0 10.0 9.1  NEUTROABS 5.7  --   --   --   --   --   --  HGB 8.1*  < > 7.7* 8.0* 7.8* 8.2* 8.3*  HCT 26.2*  < > 24.0* 24.8* 25.4* 26.7* 26.8*  MCV 76.8*  < > 78.9 80.8 80.6 79.9 79.1  PLT 117*  < > 129* 134* 167 227 282  < > = values in this interval not displayed. Cardiac Enzymes: No results for input(s): CKTOTAL, CKMB, CKMBINDEX, TROPONINI in the last 168 hours. BNP: BNP (last 3 results)  Recent Labs  11/25/15 1134 01/29/16 2020  BNP 39.8 19.1    ProBNP (last 3 results) No results for input(s): PROBNP in the last 8760 hours.  CBG:  Recent Labs Lab 02/03/16 0611 02/03/16 1120 02/03/16 1702 02/03/16 2027 02/04/16 0637  GLUCAP 90 100* 101* 96 102*       Signed:  Niel Hummer A MD.  [facility] 02/06/2016, 10:56 AM
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
## 2016-02-06 NOTE — Progress Notes (Signed)
Inpatient Rehabilitation  Met with patient to discuss team's recommendation for IP Rehab.  Patient agreeable to intensive therapies prior to discharge home with intermittent supervision.  MD cleared patient for admission today.  Notified RN CM and CSW will admit later today.  Please call with questions.   Carmelia Roller., CCC/SLP Admission Coordinator  [facility]  Cell [phone]
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
## 2016-02-06 NOTE — Progress Notes (Signed)
Report called to rehab. IV's remained. Transported patient over.   Mumtaz Lovins, Charlaine Dalton RN

## 2016-02-06 NOTE — Progress Notes (Signed)
Patient given admission packet, and discussed the safety plan and agreement- understanding verbalized. Oriented patient to room, rehab schedule, and answered all questions. Will continue to monitor patient.Todd Parrish
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
## 2016-02-07 ENCOUNTER — Inpatient Hospital Stay (HOSPITAL_COMMUNITY): Payer: Medicaid Other

## 2016-02-07 ENCOUNTER — Inpatient Hospital Stay (HOSPITAL_COMMUNITY): Payer: Self-pay | Admitting: Physical Therapy

## 2016-02-07 ENCOUNTER — Inpatient Hospital Stay (HOSPITAL_COMMUNITY): Payer: Self-pay | Admitting: Occupational Therapy

## 2016-02-07 DIAGNOSIS — J690 Pneumonitis due to inhalation of food and vomit: Secondary | ICD-10-CM

## 2016-02-07 DIAGNOSIS — N179 Acute kidney failure, unspecified: Secondary | ICD-10-CM

## 2016-02-07 DIAGNOSIS — D62 Acute posthemorrhagic anemia: Secondary | ICD-10-CM

## 2016-02-07 DIAGNOSIS — S82143A Displaced bicondylar fracture of unspecified tibia, initial encounter for closed fracture: Secondary | ICD-10-CM | POA: Diagnosis present

## 2016-02-07 DIAGNOSIS — S82141S Displaced bicondylar fracture of right tibia, sequela: Secondary | ICD-10-CM

## 2016-02-07 DIAGNOSIS — I2699 Other pulmonary embolism without acute cor pulmonale: Secondary | ICD-10-CM | POA: Diagnosis present

## 2016-02-07 DIAGNOSIS — K661 Hemoperitoneum: Secondary | ICD-10-CM

## 2016-02-07 MED ORDER — LISINOPRIL 2.5 MG PO TABS
2.5000 mg | ORAL_TABLET | Freq: Every day | ORAL | Status: DC
  Administered 2016-02-07 – 2016-02-08 (×2): 2.5 mg via ORAL
  Filled 2016-02-07 (×2): qty 1
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
## 2016-02-07 NOTE — Progress Notes (Signed)
[place] PHYSICAL MEDICINE & REHABILITATION     PROGRESS NOTE  Subjective/Complaints:  Pt seen laying in bed this AM. He is unpleased with his diet and states that he does not have any comorbities that warrant a modified diet.  He slept well overnight and is ready to begin therapies.   ROS: Denies CP, SOB, N/V/D.   Objective: Vital Signs: Blood pressure 136/81, pulse 97, temperature 99 F (37.2 C), temperature source Oral, resp. rate 18, height  (1.702 m), weight 154.994 kg (341 lb 11.2 oz), SpO2 97 %. No results found.  Recent Labs  02/05/16 0605 02/06/16 0350  WBC 10.0 9.1  HGB 8.2* 8.3*  HCT 26.7* 26.8*  PLT 227 282    Recent Labs  02/05/16 0605 02/06/16 0350  NA 138 140  K 4.0 4.1  CL 106 107  GLUCOSE 108* 101*  BUN 14 12  CREATININE 1.44* 1.34*  CALCIUM 8.5* 8.6*   CBG (last 3)  No results for input(s): GLUCAP in the last 72 hours.  Wt Readings from Last 3 Encounters:  02/06/16 154.994 kg (341 lb 11.2 oz)  02/01/16 155 kg (341 lb 11.4 oz)  01/14/16 157.852 kg (348 lb)    Physical Exam:  BP 136/81 mmHg  Pulse 97  Temp(Src) 99 F (37.2 C) (Oral)  Resp 18  Ht  (1.702 m)  Wt 154.994 kg (341 lb 11.2 oz)  BMI 53.51 kg/m2  SpO2 97% Constitutional: He appears well-developed and well-nourished. No distress.  Vital signs reviewed.  HENT: Normocephalic and atraumatic.  Eyes: Conjunctivae and EOM are normal.   Cardiovascular: Normal rate and regular rhythm.No murmur heard. Respiratory: Effort normal and breath sounds normal. No respiratory distress.  GI: Soft. Bowel sounds are normal. He exhibits no distension. There is no tenderness.  Musculoskeletal: He exhibits edema (BLE).  Well healed incision right shin. Tends to keep RLE rotated outward.  Neurological: He is alert and oriented.  Speech clear.  Follows commands without difficulty.  Sensation intact to light touch. Motor: B/l UE 5/5 proximal to distal LLE: 4+/5 proximal to  distal RLE: 4/5 hip flexion, knee extension, 4+/5 ankle dorsi/plantar flexion 3- L HF, KE and 4- ADF 4+ edema RLE, 3+ edema LLE Skin: Skin is warm and dry. He is not diaphoretic.  Psychiatric: He has a normal mood and affect. His behavior is normal. Thought content normal.   Assessment/Plan: 1. Functional deficits secondary to submassive PE and morbid obesity which require 3+ hours per day of interdisciplinary therapy in a comprehensive inpatient rehab setting. Physiatrist is providing close team supervision and 24 hour management of active medical problems listed below. Physiatrist and rehab team continue to assess barriers to discharge/monitor patient progress toward functional and medical goals.  Function:  Bathing Bathing position      Bathing parts      Bathing assist        Upper Body Dressing/Undressing Upper body dressing                    Upper body assist        Lower Body Dressing/Undressing Lower body dressing                                  Lower body assist        Toileting Toileting Toileting activity did not occur: Safety/medical concerns        Toileting assist  Transfers Chair/bed Company secretary Comprehension Comprehension assist level: Follows complex conversation/direction with extra time/assistive device  Expression Expression assist level: Expresses basic needs/ideas: With no assist  Social Interaction Social Interaction assist level: Interacts appropriately with others with medication or extra time (anti-anxiety, antidepressant).  Problem Solving Problem solving assist level: Solves basic problems with no assist  Memory Memory assist level: More than reasonable amount of time    Medical Problem List and Plan: 1. Debility secondary to submassive PE and morbid obesity.   Begin CIR 2. Submassive PE/DVT Prophylaxis/Anticoagulation:  Mechanical: Sequential compression devices, below knee Bilateral lower extremities. Has IVC filter in place and to follow up with IVR in 8-12 weeks. Follow up with Pulmonary in 3 months. No chemical anticoagulation due to retroperitoneal bleed. 3. Pain Management: Ultram prn for pain.  4. Mood: LCSW to follow for evaluation and support.  5. Neuropsych: This patient is capable of making decisions on his own behalf. 6. Skin/Wound Care: routine pressure relief measures.  7. Fluids/Electrolytes/Nutrition: Monitor I/Os.  8. Aspiration PNA: Cont Doxycyline - Abx D# 7/7 9. Retroperitoneal bleed with ABLA: H/H stable after transfusion with 2 units.  Added iron for supplement. Monitor for any signs of bleeding.  Hb 8.3 on 2/16  Will order labs for Monday 10. Cardiac arrest/PEA: Due to submassive PE. (see #2) 11. Large scrotal hernia: Follow up with PMD for referral to surgery in as outpt.  12. Morbid obesity: Life style modification. Carb modified diet.  13. Right bicondylar tibial plateau fracture s/p fasciotomy RLE and ORIF  PWB.  14. H/o HIV: Followed by Dr. Ninetta Lights.  15. Prediabetes: Hgb A1c-6.0. Dietician educate patient on appropriate diet.  16. AKI  CR improving, 1.34 on 2/16  Will order labs for Monday  LOS (Days) 1 A FACE TO FACE EVALUATION WAS PERFORMED  Ankit Karis Juba 02/07/2016 8:40 AM

## 2016-02-07 NOTE — Care Management Note (Signed)
Inpatient Rehabilitation Center Individual Statement of Services  Patient Name:  Todd Parrish  Date:  02/07/2016  Welcome to the Inpatient Rehabilitation Center.  Our goal is to provide you with an individualized program based on your diagnosis and situation, designed to meet your specific needs.  With this comprehensive rehabilitation program, you will be expected to participate in at least 3 hours of rehabilitation therapies Monday-Friday, with modified therapy programming on the weekends.  Your rehabilitation program will include the following services:  Physical Therapy (PT), Occupational Therapy (OT), 24 hour per day rehabilitation nursing, Therapeutic Recreaction (TR), Neuropsychology, Case Management (Social Worker), Rehabilitation Medicine, Nutrition Services and Pharmacy Services  Weekly team conferences will be held on Wednesday to discuss your progress.  Your Social Worker will talk with you frequently to get your input and to update you on team discussions.  Team conferences with you and your family in attendance may also be held.  Expected length of stay: 7-10 days Overall anticipated outcome: mod/i level  Depending on your progress and recovery, your program may change. Your Social Worker will coordinate services and will keep you informed of any changes. Your Social Worker's name and contact numbers are listed  below.  The following services may also be recommended but are not provided by the Inpatient Rehabilitation Center:   Driving Evaluations  Home Health Rehabiltiation Services  Outpatient Rehabilitation Services  Vocational Rehabilitation   Arrangements will be made to provide these services after discharge if needed.  Arrangements include referral to agencies that provide these services.  Your insurance has been verified to be:  Pending Medicaid Your primary doctor is:  None  Pertinent information will be shared with your doctor and your insurance  company.  Social Worker:  Dossie Der, SW [phone] or [phone]  Information discussed with and copy given to patient by: Lucy Chris, 02/07/2016, 11:34 AM
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
## 2016-02-07 NOTE — Progress Notes (Signed)
Occupational Therapy Session Note  Patient Details  Name: Todd Parrish MRN: [id number] Date of Birth: [birth year]  Today's Date: 02/07/2016 OT Individual Time: 1430-1500 OT Individual Time Calculation (min): 30 min    Short Term Goals: Week 1:  OT Short Term Goal 1 (Week 1): Pt will complete tub/shower transfer using bath bench and LRAD with min assist OT Short Term Goal 2 (Week 1): Pt will complete simple meal prep using RW with supervision OT Short Term Goal 3 (Week 1): Pt will complete 2 grooming tasks standing at sink for 3 minutes OT Short Term Goal 4 (Week 1): Pt will bathe using AE with supervision OT Short Term Goal 5 (Week 1): Pt will complete lower body dressing using AE, prn, with min assist  Skilled Therapeutic Interventions/Progress Updates:    1:1 Pt in bed when arrived. Pt required more than reasonable amt of time to come to EOB (with HOB elevated).  Pt Bp taken in session 153/80 sitting EOB.  After more than reasonable amt of time pt able to come into standing with steadying A and transfer (stand pivot ) into w/c. Pt transitioned down to gym and focused on functional transfer into car.  Pt plans to transfer in and out of 4 Runner (estmated about 24 inches high).  Pt unable to put both of his LEs in the car due short floor board in our simulated car. Pt able to perform stand pivot transfer from w/c to car with RW with steadying A.    Returned to EOB and pt with BP 175/103 - RN aware.  Returned to sitting upright in bed with extra time with supervision for postioning in bed.   Therapy Documentation Precautions:  Precautions Precautions: Other (comment) (PWB RLE) Restrictions Weight Bearing Restrictions: Yes RLE Weight Bearing: Partial weight bearing RLE Partial Weight Bearing Percentage or Pounds: 50% Pain: Pain Assessment Pain Assessment: 0-10 Pain Score: Asleep Pain Location: Head Pain Orientation: Mid Pain Descriptors / Indicators: Aching Pain Onset:  Gradual Patients Stated Pain Goal: 0 Pain Intervention(s): Medication (See eMAR) ADL: ADL ADL Comments: see Functional Assessment Tool  See Function Navigator for Current Functional Status.   Therapy/Group: Individual Therapy  Roney Mans [facility] 02/07/2016, 7:06 PM

## 2016-02-07 NOTE — Plan of Care (Signed)
Problem: Food- and Nutrition-Related Knowledge Deficit (NB-1.1) Goal: Nutrition education Formal process to instruct or train a patient/client in a skill or to impart knowledge to help patients/clients voluntarily manage or modify food choices and eating behavior to maintain or improve health. Outcome: Completed/Met Date Met:  02/07/16 Consulted for nutrition education regarding diabetes.  HgbA1c: 6.0%  Dietetic Intern provided "General Healthy Eating" and "Carbohydrate Counting for People with Diabetes" handouts from the Academy of Nutrition and Dietetics.  Discussed the different food groups and their effects on blood sugar, emphasizing carbohydrate-containing foods.  Provided list of carbohydrates and recommended serving sizes of common foods. Discussed importance of controlled carbohydrate intake.  Pt reports enjoying many different fruits, vegetables, chicken/turkey, and cheese.  Provided examples of portion sizes and ways to balance meals/snacks. Encouraged intake of high-fiber, whole grain complex carbohydrates.  Pt stated that he enjoys regular sodas and juice.  Discussed other drink options that are lower in sugar like water with crystal light packets.  Expect fair compliance.  Body mass index is 53.6 kg/(m^2).  Pt meets criteria for morbid obesity based on current BMI.  Current diet order is CHO Mod diet with thin liquids.  Patient is consuming approximately 100% of meals at this time.  Reports fair appetite now and PTA.  Pt expressed frustration that he did not need to be on a CHO Mod diet and wanted to be on a Regular diet.  Intern explained that with a slightly elevated A1c he should monitor his carbohydrate intake to decrease risk of progressing towards diabetes.  No nutrition interventions warranted at this time.  If nutrition issues arise, please re-consult RD.   Veronda Prude, Dietetic Intern Pager: [phone]

## 2016-02-07 NOTE — Progress Notes (Signed)
Patient information reviewed and entered into eRehab system by Wandalene Abrams, RN, CRRN, PPS Coordinator.  Information including medical coding and functional independence measure will be reviewed and updated through discharge.    

## 2016-02-08 ENCOUNTER — Inpatient Hospital Stay (HOSPITAL_COMMUNITY): Payer: Medicaid Other | Admitting: *Deleted

## 2016-02-08 ENCOUNTER — Inpatient Hospital Stay (HOSPITAL_COMMUNITY): Payer: Medicaid Other | Admitting: Occupational Therapy

## 2016-02-08 MED ORDER — LISINOPRIL 5 MG PO TABS
5.0000 mg | ORAL_TABLET | Freq: Every day | ORAL | Status: DC
  Administered 2016-02-09 – 2016-02-13 (×5): 5 mg via ORAL
  Filled 2016-02-08 (×5): qty 1

## 2016-02-08 MED ORDER — LISINOPRIL 2.5 MG PO TABS
2.5000 mg | ORAL_TABLET | ORAL | Status: AC
  Administered 2016-02-08: 2.5 mg via ORAL
  Filled 2016-02-08: qty 1

## 2016-02-08 NOTE — Progress Notes (Signed)
[facility]IC[facility] & RE[facility]ABILITATION     PROGRESS NOTE  Subjective/Complaints:  Still throwing a fit over diet. Otherwise doing well  ROS: Denies CP, SOB, N/V/D.   Objective: Vital Signs: Blood pressure 149/90, pulse 92, temperature 98.3 F (36.8 C), temperature source Oral, resp. rate 20, height  (1.702 m), weight 154.994 kg (341 lb 11.2 oz), SpO2 97 %. No results found.  Recent Labs  02/06/16 0350  WBC 9.1  HGB 8.3*  HCT 26.8*  PLT 282    Recent Labs  02/06/16 0350  NA 140  K 4.1  CL 107  GLUCOSE 101*  BUN 12  CREATININE 1.34*  CALCIUM 8.6*   CBG (last 3)  No results for input(s): GLUCAP in the last 72 hours.  Wt Readings from Last 3 Encounters:  02/06/16 154.994 kg (341 lb 11.2 oz)  02/01/16 155 kg (341 lb 11.4 oz)  01/14/16 157.852 kg (348 lb)    Physical Exam:  BP 149/90 mmHg  Pulse 92  Temp(Src) 98.3 F (36.8 C) (Oral)  Resp 20  Ht  (1.702 m)  Wt 154.994 kg (341 lb 11.2 oz)  BMI 53.51 kg/m2  SpO2 97% Constitutional: He appears well-developed and well-nourished. No distress.  Vital signs reviewed.  HENT: Normocephalic and atraumatic.  Eyes: Conjunctivae and EOM are normal.   Cardiovascular: Normal rate and regular rhythm.No murmur heard. Respiratory: Effort normal and breath sounds normal. No respiratory distress.  GI: Soft. Bowel sounds are normal. He exhibits no distension. There is no tenderness.  Musculoskeletal: He exhibits edema (BLE).  Well healed incision right shin. Tends to keep RLE rotated outward.  Neurological: He is alert and oriented.  Speech clear.  Follows commands without difficulty.  Sensation intact to light touch. Motor: B/l UE 5/5 proximal to distal LLE: 4+/5 proximal to distal RLE: 4/5 hip flexion, knee extension, 4+/5 ankle dorsi/plantar flexion 3- L HF, KE and 4- ADF 4+ edema RLE, 3+ edema LLE Skin: Skin is warm and dry. He is not diaphoretic.  Psychiatric: He has a normal mood and affect. His  behavior is normal. Thought content normal.   Assessment/Plan: 1. Functional deficits secondary to submassive PE and morbid obesity which require 3+ hours per day of interdisciplinary therapy in a comprehensive inpatient rehab setting. Physiatrist is providing close team supervision and 24 hour management of active medical problems listed below. Physiatrist and rehab team continue to assess barriers to discharge/monitor patient progress toward functional and medical goals.  Function:  Bathing Bathing position   Position: Wheelchair/chair at sink  Bathing parts Body parts bathed by patient: Right arm, Left arm, Chest, Abdomen Body parts bathed by helper: Buttocks, Back  Bathing assist Assist Level: Touching or steadying assistance(Pt > 75%)      Upper Body Dressing/Undressing Upper body dressing   What is the patient wearing?: Pull over shirt/dress     Pull over shirt/dress - Perfomed by patient: Thread/unthread right sleeve, Thread/unthread left sleeve, Put head through opening, Pull shirt over trunk          Upper body assist Assist Level: Set up   Set up : To obtain clothing/put away  Lower Body Dressing/Undressing Lower body dressing   What is the patient wearing?: Pants, Non-skid slipper socks     Pants- Performed by patient: Fasten/unfasten pants Pants- Performed by helper: Thread/unthread right pants leg, Thread/unthread left pants leg, Pull pants up/down   Non-skid slipper socks- Performed by helper: Don/doff right sock, Don/doff left sock  Lower body assist Assist for lower body dressing: Touching or steadying assistance (Pt > 75%)      Toileting Toileting Toileting activity did not occur: Safety/medical concerns Toileting steps completed by patient: Adjust clothing prior to toileting, Performs perineal hygiene Toileting steps completed by helper: Adjust clothing after toileting    Toileting assist Assist level: Touching or steadying  assistance (Pt.75%)   Transfers Chair/bed transfer   Chair/bed transfer method: Stand pivot Chair/bed transfer assist level: Touching or steadying assistance (Pt > 75%) Chair/bed transfer assistive device: Patent attorney Ambulation activity did not occur: Safety/medical concerns (elevated BP)         Wheelchair Wheelchair activity did not occur: Safety/medical concerns (c/o sternal pain when attempting to propel w/c with BUE)   Max wheelchair distance: 150 Assist Level: Dependent (Pt equals 0%)  Cognition Comprehension Comprehension assist level: Follows complex conversation/direction with extra time/assistive device  Expression Expression assist level: Expresses basic needs/ideas: With no assist  Social Interaction Social Interaction assist level: Interacts appropriately 90% of the time - Needs monitoring or encouragement for participation or interaction.  Problem Solving Problem solving assist level: Solves basic problems with no assist  Memory Memory assist level: More than reasonable amount of time    Medical Problem List and Plan: 1. Debility secondary to submassive PE and morbid obesity.   Continue CIR 2. Submassive PE/DVT Prophylaxis/Anticoagulation: Mechanical: Sequential compression devices, below knee Bilateral lower extremities. Has IVC filter in place and to follow up with IVR in 8-12 weeks. Follow up with Pulmonary in 3 months. No chemical anticoagulation due to retroperitoneal bleed. 3. Pain Management: Ultram prn for pain.  4. Mood: LCSW to follow for evaluation and support.  5. Neuropsych: This patient is capable of making decisions on his own behalf. 6. Skin/Wound Care: routine pressure relief measures.  7. Fluids/Electrolytes/Nutrition: Monitor I/Os.  8. Aspiration PNA:   Doxycyline completed 9. Retroperitoneal bleed with ABLA: H/H stable after transfusion with 2 units.  Added iron for supplement.              -Hb 8.3 on 2/16   -  labs for Monday 10. Cardiac arrest/PEA: Due to submassive PE. (see #2) 11. Large scrotal hernia: Follow up with PMD for referral to surgery in as outpt.  12. Morbid obesity: will allow regular diet  -discussed dietary choices, avoidance of excessive carbs  -[place] says [place] and fast food has been what's easy/available---understands that he needs to eat better  13. Right bicondylar tibial plateau fracture s/p fasciotomy RLE and ORIF  PWB.  14. H/o HIV: Followed by Dr. Ninetta Lights.  15. Prediabetes: Hgb A1c-6.0. Dietician educate patient on appropriate diet.  16. AKI  CR improving, 1.34 on 2/16  Will order labs for Monday  LOS (Days) 2 A FACE TO FACE EVALUATION WAS PERFORMED  SWARTZ,ZACHARY T 02/08/2016 9:03 AM

## 2016-02-08 NOTE — Progress Notes (Signed)
Physical Therapy Session Note  Patient Details  Name: Todd Parrish MRN: [id number] Date of Birth: Apr 08, 1981  Today's Date: 02/08/2016 PT Individual Time: 1415-1530 PT Individual Time Calculation (min): 75 min     Skilled Therapeutic Interventions/Progress Updates:  Patient in room sitting in w/c agrees to session, no complains of pain. Initiated tx with w/c propulsion with L LE and R UE in order top prevent pain in chest associated with recent CPR. Patient disliked the idea of self propulsion but with increased cues was able to maneuver w/c forward for 45 feet.  Gait Training with RW 2 x 30 feet with CGA and cues to maintain weight bearing precautions. Patient able to go up 2 steps with B rails and descend backwards with Supervision.  BP after gait and stairs has increased to 158/101. Rest offered and BPchecked after 5 min of sitting with return to 130/86.  Standing activity with R foot step ups 1 x 4 min with significant increase in BP noted.  Rest of session performed in sitting unsupported in w/c and on mat.  Ball tapping with 2 lbs bat,boxing ,theraband resistance exercises for B LE and  all planes of motion for UE with 3 lbs ,and core training performed in order to increase patient overall activity tolerance and increase cardiopulmonary compliance.  At the end of session patient returned to room , all needs within reach.   Therapy Documentation Precautions:  Precautions Precautions: Other (comment) (PWB RLE) Restrictions Weight Bearing Restrictions: Yes RLE Weight Bearing: Partial weight bearing RLE Partial Weight Bearing Percentage or Pounds: 50%   See Function Navigator for Current Functional Status.   Therapy/Group: Individual Therapy  Dorna Mai 02/08/2016, 3:54 PM

## 2016-02-08 NOTE — Progress Notes (Signed)
Occupational Therapy Session Note  Patient Details  Name: Todd Parrish MRN: [id number] Date of Birth: 1981-06-14  Today's Date: 02/08/2016 OT Individual Time: 1000-1100 and 1305-1400 OT Individual Time Calculation (min): 60 min and 55 min   Short Term Goals: Week 1:  OT Short Term Goal 1 (Week 1): Pt will complete tub/shower transfer using bath bench and LRAD with min assist OT Short Term Goal 2 (Week 1): Pt will complete simple meal prep using RW with supervision OT Short Term Goal 3 (Week 1): Pt will complete 2 grooming tasks standing at sink for 3 minutes OT Short Term Goal 4 (Week 1): Pt will bathe using AE with supervision OT Short Term Goal 5 (Week 1): Pt will complete lower body dressing using AE, prn, with min assist  Skilled Therapeutic Interventions/Progress Updates:   1) ADL retraining with focus on functional transfers, sit > stand, and increased independence in self-care tasks.  Pt required increased time with bed mobility and sit > stand initially.  Pt reports no dizziness or light-headedness.  Stand pivot transfer bed > w/c with RW and min assist with +2 present for safety.  Pt deferred LB bathing at this time, but completed grooming tasks and UB bathing and dressing seated at sink.  Educated on tub/shower transfer with use of tub bench.  Pt completed tub transfer with RW and min assist, requiring increased time to lift BLE over tub ledge but able to complete without assist.   2) Treatment session with focus on bed mobility, toilet transfers, and energy conservation strategies.  Pt in bed upon arrival reporting need to toilet.  Expressed interest in using toilet instead of BSC.  Pt ambulated with RW with min assist over threshold in to bathroom, completing toilet transfer, clothing management, and hygiene with supervision.  Utilized lateral leans for hygiene.  Required increased time as use of grab bar with sit > stand from low toilet seat.  Educated on energy conservation  strategies and various DME to increase success with self-care tasks and transfers.  Pt encouraged to sit up in w/c to finish lunch and encouraging increased OOB tolerance.  Therapy Documentation Precautions:  Precautions Precautions: Other (comment) (PWB RLE) Restrictions Weight Bearing Restrictions: Yes RLE Weight Bearing: Partial weight bearing RLE Partial Weight Bearing Percentage or Pounds: 50% General:   Vital Signs: Therapy Vitals BP: (!) 141/82 mmHg Patient Position (if appropriate): Sitting Pain:  Pt with c/o headache at end of session, notified RN ADL: ADL ADL Comments: see Functional Assessment Tool  See Function Navigator for Current Functional Status.   Therapy/Group: Individual Therapy  Rosalio Loud 02/08/2016, 12:12 PM

## 2016-02-09 ENCOUNTER — Inpatient Hospital Stay (HOSPITAL_COMMUNITY): Payer: Medicaid Other | Admitting: Occupational Therapy

## 2016-02-09 LAB — OCCULT BLOOD X 1 CARD TO LAB, STOOL: FECAL OCCULT BLD: NEGATIVE

## 2016-02-09 NOTE — Progress Notes (Signed)
[facility]ICAL MEDICINE & REHABILITATION     PROGRESS NOTE  Subjective/Complaints:  Feels that he had a really productive day yesterday. Denies pain. Slept well.   ROS: Denies CP, SOB, N/V/D.   Objective: Vital Signs: Blood pressure 131/88, pulse 93, temperature 98.4 F (36.9 C), temperature source Oral, resp. rate 17, height  (1.702 m), weight 154.994 kg (341 lb 11.2 oz), SpO2 97 %. No results found. No results for input(s): WBC, HGB, HCT, PLT in the last 72 hours. No results for input(s): NA, K, CL, GLUCOSE, BUN, CREATININE, CALCIUM in the last 72 hours.  Invalid input(s): CO CBG (last 3)  No results for input(s): GLUCAP in the last 72 hours.  Wt Readings from Last 3 Encounters:  02/06/16 154.994 kg (341 lb 11.2 oz)  02/01/16 155 kg (341 lb 11.4 oz)  01/14/16 157.852 kg (348 lb)    Physical Exam:  BP 131/88 mmHg  Pulse 93  Temp(Src) 98.4 F (36.9 C) (Oral)  Resp 17  Ht  (1.702 m)  Wt 154.994 kg (341 lb 11.2 oz)  BMI 53.51 kg/m2  SpO2 97% Constitutional: He appears well-developed and well-nourished. No distress.  Vital signs reviewed.  HENT: Normocephalic and atraumatic.  Eyes: Conjunctivae and EOM are normal.   Cardiovascular: Normal rate and regular rhythm.No murmur heard. Respiratory: Effort normal and breath sounds normal. No respiratory distress.  GI: Soft. Bowel sounds are normal. He exhibits no distension. There is no tenderness.  Musculoskeletal: He exhibits edema (BLE).  Well healed incision right shin. Tends to keep RLE rotated outward.  Neurological: He is alert and oriented.  Speech clear.  Follows commands without difficulty.  Sensation intact to light touch. Motor: B/l UE 5/5 proximal to distal LLE: 4+/5 proximal to distal RLE: 4/5 hip flexion, knee extension, 4+/5 ankle dorsi/plantar flexion 3- L HF, KE and 4- ADF 4+ edema RLE, 3+ edema LLE Skin: Skin is warm and dry. He is not diaphoretic.  Psychiatric: He has a normal mood  and affect. His behavior is normal. Thought content normal.   Assessment/Plan: 1. Functional deficits secondary to submassive PE and morbid obesity which require 3+ hours per day of interdisciplinary therapy in a comprehensive inpatient rehab setting. Physiatrist is providing close team supervision and 24 hour management of active medical problems listed below. Physiatrist and rehab team continue to assess barriers to discharge/monitor patient progress toward functional and medical goals.  Function:  Bathing Bathing position   Position: Wheelchair/chair at sink  Bathing parts Body parts bathed by patient: Right arm, Left arm, Chest, Abdomen Body parts bathed by helper: Buttocks, Back  Bathing assist Assist Level: Set up      Upper Body Dressing/Undressing Upper body dressing   What is the patient wearing?: Pull over shirt/dress     Pull over shirt/dress - Perfomed by patient: Thread/unthread right sleeve, Thread/unthread left sleeve, Put head through opening, Pull shirt over trunk          Upper body assist Assist Level: Set up   Set up : To obtain clothing/put away  Lower Body Dressing/Undressing Lower body dressing   What is the patient wearing?: Pants, Non-skid slipper socks     Pants- Performed by patient: Fasten/unfasten pants Pants- Performed by helper: Thread/unthread right pants leg, Thread/unthread left pants leg, Pull pants up/down   Non-skid slipper socks- Performed by helper: Don/doff right sock, Don/doff left sock                  Lower  body assist Assist for lower body dressing: Touching or steadying assistance (Pt > 75%)      Toileting Toileting Toileting activity did not occur: Safety/medical concerns Toileting steps completed by patient: Adjust clothing prior to toileting, Performs perineal hygiene, Adjust clothing after toileting Toileting steps completed by helper: Adjust clothing after toileting    Toileting assist Assist level: Supervision  or verbal cues   Transfers Chair/bed transfer   Chair/bed transfer method: Stand pivot Chair/bed transfer assist level: Touching or steadying assistance (Pt > 75%) Chair/bed transfer assistive device: Patent attorney Ambulation activity did not occur: Safety/medical concerns (elevated BP)   Max distance: 30 Assist level: Touching or steadying assistance (Pt > 75%)   Wheelchair Wheelchair activity did not occur: Safety/medical concerns (c/o sternal pain when attempting to propel w/c with BUE)   Max wheelchair distance: 150 Assist Level: Dependent (Pt equals 0%)  Cognition Comprehension Comprehension assist level: Follows complex conversation/direction with extra time/assistive device  Expression Expression assist level: Expresses basic needs/ideas: With no assist  Social Interaction Social Interaction assist level: Interacts appropriately 90% of the time - Needs monitoring or encouragement for participation or interaction.  Problem Solving Problem solving assist level: Solves basic problems with no assist  Memory Memory assist level: More than reasonable amount of time    Medical Problem List and Plan: 1. Debility secondary to submassive PE and morbid obesity.   Continue CIR 2. Submassive PE/DVT Prophylaxis/Anticoagulation: Mechanical: Sequential compression devices, below knee Bilateral lower extremities. Has IVC filter in place and to follow up with IVR in 8-12 weeks. Follow up with Pulmonary in 3 months. No chemical anticoagulation due to retroperitoneal bleed. 3. Pain Management: Ultram prn for pain.  4. Mood: LCSW to follow for evaluation and support.  5. Neuropsych: This patient is capable of making decisions on his own behalf. 6. Skin/Wound Care: routine pressure relief measures.  7. Fluids/Electrolytes/Nutrition: Monitor I/Os.  8. Aspiration PNA:   Doxycyline completed 9. Retroperitoneal bleed with ABLA: H/H stable after transfusion with 2 units.   Added iron for supplement.              -Hb 8.3 on 2/16   - labs for Monday 10. Cardiac arrest/PEA: Due to submassive PE. (see #2) 11. Large scrotal hernia: Follow up with PMD for referral to surgery in as outpt.  12. Morbid obesity: have allowed a regular diet  -discussed dietary choices, avoidance of excessive carbs  -[place] says [place] and fast food has been what's easy/available---understands that he needs to eat better  13. Right bicondylar tibial plateau fracture s/p fasciotomy RLE and ORIF  PWB.  14. H/o HIV: Followed by Dr. Ninetta Lights.  15. Prediabetes: Hgb A1c-6.0. Dietician educate patient on appropriate diet.  16. AKI  CR improving, 1.34 on 2/16  bmet pending for monday  LOS (Days) 3 A FACE TO FACE EVALUATION WAS PERFORMED  Todd Parrish T 02/09/2016 8:40 AM

## 2016-02-09 NOTE — Progress Notes (Signed)
Occupational Therapy Session Note  Patient Details  Name: Todd Parrish MRN: [id number] Date of Birth: July 05, 1981  Today's Date: 02/09/2016 OT Individual Time:  -   0730-0800  (30 min)  Short Term Goals: Week 1:  OT Short Term Goal 1 (Week 1): Pt will complete tub/shower transfer using bath bench and LRAD with min assist OT Short Term Goal 2 (Week 1): Pt will complete simple meal prep using RW with supervision OT Short Term Goal 3 (Week 1): Pt will complete 2 grooming tasks standing at sink for 3 minutes OT Short Term Goal 4 (Week 1): Pt will bathe using AE with supervision OT Short Term Goal 5 (Week 1): Pt will complete lower body dressing using AE, prn, with min assist Week 2:     Skilled Therapeutic Interventions/Progress Updates:    Pt. Lying in bed.  Agreed to Sit EOB and engaged in therapeutic activities, balance, strengthening, and mobility   Pt needed SBA to go from supine to sit.  Sat with no assistance EOB.  Engaged in UE exercises with PNF and AROM.  Pt complained of no pain.  Transferred to wc with min assist and RW and left there with all needs in reach.    Therapy Documentation Precautions:  Precautions Precautions: Other (comment) (PWB RLE) Restrictions Weight Bearing Restrictions: Yes RLE Weight Bearing: Partial weight bearing RLE Partial Weight Bearing Percentage or Pounds: 50%    Vital Signs: Therapy Vitals Temp: 98.4 F (36.9 C) Temp Source: Oral Pulse Rate: 93 Resp: 17 BP: 131/88 mmHg Patient Position (if appropriate): Lying Oxygen Therapy SpO2: 97 % O2 Device: Not Delivered Pain:  none   ADL: ADL ADL Comments: see Functional Assessment Tool   See Function Navigator for Current Functional Status.   Therapy/Group: Individual Therapy  Humberto Seals 02/09/2016, 7:15 AM

## 2016-02-10 ENCOUNTER — Inpatient Hospital Stay (HOSPITAL_COMMUNITY): Payer: Self-pay

## 2016-02-10 ENCOUNTER — Inpatient Hospital Stay (HOSPITAL_COMMUNITY): Payer: Self-pay | Admitting: Physical Therapy

## 2016-02-10 ENCOUNTER — Encounter (HOSPITAL_COMMUNITY): Payer: Self-pay

## 2016-02-10 ENCOUNTER — Inpatient Hospital Stay (HOSPITAL_COMMUNITY): Payer: Medicaid Other

## 2016-02-10 LAB — CBC WITH DIFFERENTIAL/PLATELET
BASOS PCT: 0 %
Basophils Absolute: 0 10*3/uL (ref 0.0–0.1)
EOS ABS: 0.2 10*3/uL (ref 0.0–0.7)
Eosinophils Relative: 2 %
HEMATOCRIT: 30.6 % — AB (ref 39.0–52.0)
HEMOGLOBIN: 9.5 g/dL — AB (ref 13.0–17.0)
LYMPHS ABS: 1.8 10*3/uL (ref 0.7–4.0)
Lymphocytes Relative: 20 %
MCH: 24.6 pg — ABNORMAL LOW (ref 26.0–34.0)
MCHC: 31 g/dL (ref 30.0–36.0)
MCV: 79.3 fL (ref 78.0–100.0)
Monocytes Absolute: 0.7 10*3/uL (ref 0.1–1.0)
Monocytes Relative: 8 %
NEUTROS ABS: 6.4 10*3/uL (ref 1.7–7.7)
NEUTROS PCT: 70 %
Platelets: 366 10*3/uL (ref 150–400)
RBC: 3.86 MIL/uL — AB (ref 4.22–5.81)
RDW: 15.6 % — ABNORMAL HIGH (ref 11.5–15.5)
WBC: 9.1 10*3/uL (ref 4.0–10.5)

## 2016-02-10 LAB — BASIC METABOLIC PANEL
Anion gap: 10 (ref 5–15)
BUN: 9 mg/dL (ref 6–20)
CHLORIDE: 104 mmol/L (ref 101–111)
CO2: 25 mmol/L (ref 22–32)
Calcium: 8.8 mg/dL — ABNORMAL LOW (ref 8.9–10.3)
Creatinine, Ser: 0.99 mg/dL (ref 0.61–1.24)
GFR calc non Af Amer: >60 mL/min (ref >60)
Glucose, Bld: 96 mg/dL (ref 65–99)
POTASSIUM: 4.5 mmol/L (ref 3.5–5.1)
SODIUM: 139 mmol/L (ref 135–145)

## 2016-02-10 NOTE — Progress Notes (Addendum)
[facility] PHYSICAL MEDICINE & REHABILITATION     PROGRESS NOTE  Subjective/Complaints:  Patient lying in bed this morning, feeling sleepy. He notes that he will woke up when his blood pressure was taken and was unable to fall back asleep.  ROS: Denies CP, SOB, N/V/D.   Objective: Vital Signs: Blood pressure 139/65, pulse 94, temperature 98.5 F (36.9 C), temperature source Oral, resp. rate 18, height  (1.702 m), weight 154.994 kg (341 lb 11.2 oz), SpO2 98 %. No results found.  Recent Labs  02/10/16 0802  WBC 9.1  HGB 9.5*  HCT 30.6*  PLT 366    Recent Labs  02/10/16 0802  NA 139  K 4.5  CL 104  GLUCOSE 96  BUN 9  CREATININE 0.99  CALCIUM 8.8*   CBG (last 3)  No results for input(s): GLUCAP in the last 72 hours.  Wt Readings from Last 3 Encounters:  02/06/16 154.994 kg (341 lb 11.2 oz)  02/01/16 155 kg (341 lb 11.4 oz)  01/14/16 157.852 kg (348 lb)    Physical Exam:  BP 139/65 mmHg  Pulse 94  Temp(Src) 98.5 F (36.9 C) (Oral)  Resp 18  Ht  (1.702 m)  Wt 154.994 kg (341 lb 11.2 oz)  BMI 53.51 kg/m2  SpO2 98% Constitutional: He appears well-developed and well-nourished. No distress.  Vital signs reviewed.  HENT: Normocephalic and atraumatic.  Eyes: Conjunctivae and EOM are normal.   Cardiovascular: Normal rate and regular rhythm.No murmur heard. Respiratory: Effort normal and breath sounds normal. No respiratory distress.  GI: Soft. Bowel sounds are normal. He exhibits no distension. There is no tenderness.  Musculoskeletal: He exhibits edema (BLE).  Well healed incision right shin. Tends to keep RLE rotated outward.  Neurological: He is alert and oriented.  Speech clear.  Follows commands without difficulty.  Sensation intact to light touch. Motor: B/l UE 5/5 proximal to distal LLE: 4/5 proximal to distal RLE: 4-/5 hip flexion, knee extension, 4+/5 ankle dorsi/plantar flexion Skin: Skin is warm and dry. He is not diaphoretic.   Psychiatric: He has a normal mood and affect. His behavior is normal. Thought content normal.   Assessment/Plan: 1. Functional deficits secondary to submassive PE and morbid obesity which require 3+ hours per day of interdisciplinary therapy in a comprehensive inpatient rehab setting. Physiatrist is providing close team supervision and 24 hour management of active medical problems listed below. Physiatrist and rehab team continue to assess barriers to discharge/monitor patient progress toward functional and medical goals.  Function:  Bathing Bathing position   Position: Wheelchair/chair at sink  Bathing parts Body parts bathed by patient: Right arm, Left arm, Chest, Abdomen Body parts bathed by helper: Buttocks, Back  Bathing assist Assist Level: Set up      Upper Body Dressing/Undressing Upper body dressing   What is the patient wearing?: Pull over shirt/dress     Pull over shirt/dress - Perfomed by patient: Thread/unthread right sleeve, Thread/unthread left sleeve, Put head through opening, Pull shirt over trunk          Upper body assist Assist Level: Set up   Set up : To obtain clothing/put away  Lower Body Dressing/Undressing Lower body dressing   What is the patient wearing?: Pants, Non-skid slipper socks     Pants- Performed by patient: Fasten/unfasten pants Pants- Performed by helper: Thread/unthread right pants leg, Thread/unthread left pants leg, Pull pants up/down   Non-skid slipper socks- Performed by helper: Don/doff right sock, Don/doff left sock  Lower body assist Assist for lower body dressing: Touching or steadying assistance (Pt > 75%)      Toileting Toileting Toileting activity did not occur: Safety/medical concerns Toileting steps completed by patient: Adjust clothing prior to toileting, Performs perineal hygiene, Adjust clothing after toileting Toileting steps completed by helper: Adjust clothing after toileting     Toileting assist Assist level: Supervision or verbal cues   Transfers Chair/bed transfer   Chair/bed transfer method: Stand pivot Chair/bed transfer assist level: Touching or steadying assistance (Pt > 75%) Chair/bed transfer assistive device: Patent attorney Ambulation activity did not occur: Safety/medical concerns (elevated BP)   Max distance: 30 Assist level: Touching or steadying assistance (Pt > 75%)   Wheelchair Wheelchair activity did not occur: Safety/medical concerns (c/o sternal pain when attempting to propel w/c with BUE)   Max wheelchair distance: 150 Assist Level: Dependent (Pt equals 0%)  Cognition Comprehension Comprehension assist level: Follows complex conversation/direction with extra time/assistive device  Expression Expression assist level: Expresses basic needs/ideas: With no assist  Social Interaction Social Interaction assist level: Interacts appropriately 90% of the time - Needs monitoring or encouragement for participation or interaction.  Problem Solving Problem solving assist level: Solves basic problems with no assist  Memory Memory assist level: More than reasonable amount of time    Medical Problem List and Plan: 1. Debility secondary to submassive PE and morbid obesity.   Continue CIR 2. Submassive PE/DVT Prophylaxis/Anticoagulation: Mechanical: Sequential compression devices, below knee Bilateral lower extremities. Has IVC filter in place and to follow up with IVR in 8-12 weeks. Follow up with Pulmonary in 3 months. No chemical anticoagulation due to retroperitoneal bleed. 3. Pain Management: Ultram prn for pain.  4. Mood: LCSW to follow for evaluation and support.  5. Neuropsych: This patient is capable of making decisions on his own behalf. 6. Skin/Wound Care: routine pressure relief measures.  7. Fluids/Electrolytes/Nutrition: Monitor I/Os.  8. Aspiration PNA:   Doxycyline completed 9. Retroperitoneal bleed with  ABLA: H/H stable after transfusion with 2 units.  Added iron for supplement.              -Hb 9.5 on 2/20 10. Cardiac arrest/PEA: Due to submassive PE. (see #2) 11. Large scrotal hernia: Follow up with PMD for referral to surgery in as outpt.  12. Morbid obesity: have allowed a regular diet  -discussed dietary choices, avoidance of excessive carbs  -[place] and fast food has been what's easy/available---understands that he needs to eat better  13. Right bicondylar tibial plateau fracture s/p fasciotomy RLE and ORIF  PWB.  14. H/o HIV: Followed by Dr. Ninetta Lights.  15. Prediabetes: Hgb A1c-6.0. Dietician educate patient on appropriate diet.  16. AKI: Resolved   CR 0.99 on 2/20  LOS (Days) 4 A FACE TO FACE EVALUATION WAS PERFORMED  Amyre Segundo Karis Juba 02/10/2016 9:22 AM

## 2016-02-10 NOTE — Plan of Care (Signed)
Problem: RH Ambulation Goal: LTG Patient will ambulate in home environment (PT) LTG: Patient will ambulate in home environment, # of feet with assistance (PT).  Upgraded on 02/10/16 due to increase in WB status

## 2016-02-10 NOTE — Progress Notes (Signed)
Occupational Therapy Session Note  Patient Details  Name: Todd Parrish MRN: [id number] Date of Birth: 1981-06-19  Today's Date: 02/10/2016 Time:11:00-12:00 60 Minutes  Short Term Goals: Week 1:  OT Short Term Goal 1 (Week 1): Pt will complete tub/shower transfer using bath bench and LRAD with min assist OT Short Term Goal 2 (Week 1): Pt will complete simple meal prep using RW with supervision OT Short Term Goal 3 (Week 1): Pt will complete 2 grooming tasks standing at sink for 3 minutes OT Short Term Goal 4 (Week 1): Pt will bathe using AE with supervision OT Short Term Goal 5 (Week 1): Pt will complete lower body dressing using AE, prn, with min assist  Skilled Therapeutic Interventions/Progress Updates: ADL-retraining with focus on improved mobility, endurance, transfers, toileting and adapted bathing/dressing skills.   With extra time, pt ambulates to bathroom using RW and standby assist to use toilet, seated.   Pt undresses seated and progresses to shower after RN removes IV lines.   Pt bathes sitting/standing using shower bench but unable to reach his feet due to habitus (obese with hernia).   Pt dresses at bath bench but requires assist to lace underwear, pants, and don socks.    Pt is able to rise to stand supported at RW to pull up his underwear and pants.   Overall pt is min assist with extra time and mod instructional cues d/t delay with processing instructions.  Pt requires vc assist to initiate tasks and provide solutions to minor problems.      Therapy Documentation Precautions:  Precautions Precautions: Other (comment) (PWB RLE) Restrictions Weight Bearing Restrictions: Yes RLE Weight Bearing: Partial weight bearing RLE Partial Weight Bearing Percentage or Pounds: 50%  Pain: No/denies pain    ADL: ADL ADL Comments: see Functional Assessment Tool   See Function Navigator for Current Functional Status.   Therapy/Group: Individual Therapy   Second  session: Today's Date: 02/10/2016 OT Group Time:1300-1400 60 Minutes   Pain Assessment:  No/denies pain  Skilled Therapeutic Interventions: Pt participates in therapeutic group activity performing sit<>stand 6 times during Wii Bowling. Pt able to perform task of holding remote in right hand, selecting A button to initiate bowling, press B button (aka "trigger") to begin arm swing and then release B button to propel ball with approx 90% accuracy during 10 frames of game play. Pt persevered through entire session with min encouragement and initial hand-over-hand guidance to learn movement patterns.Pt fatigued as session progressed and he finished his last 4 frames sitting vs standing supported at table.   Pt then performed BUE HEP seated in w/c with supervision and intermittent hand-over-hand guidance to perform 6 of 7 exercises. Pt demo's moderate awareness of intent of exercise and therapeutic activity and remained cooperative during session.  See FIM for current functional status  Therapy/Group: Group Therapy   Tamsyn Owusu 02/10/2016, 12:31 PM

## 2016-02-10 NOTE — Progress Notes (Signed)
Physical Therapy Session Note  Patient Details  Name: Todd Parrish MRN: [id number] Date of Birth: Oct 29, 1981  Today's Date: 02/10/2016 PT Individual Time: 1015-1100 and 5051-0712 PT Individual Time Calculation (min): 45 min and 35 minutes  Short Term Goals: Week 1:  PT Short Term Goal 1 (Week 1): STG = LTG  Skilled Therapeutic Interventions/Progress Updates:    Treatment 1: Pt received in bed & agreeable to PT treatment. Pt denied any c/o pain; BP at rest = 135/79 mmHg & HR = 80 bpm.  Pt requires encouragement & extended time to complete all mobility tasks throughout session.  Pt transferred out of bed with HOB slightly inclined; pt reports his bed at home is flat, but he has multiple pillows so his head is elevated to prevent vertigo.  Pt was able to ambulate 40 ft in hallway with bariatric RW. Pt reported he was able to put as much weight through his RLE as he can tolerate; PT educated pt on PWB RLE status & need to abide by precautions to facilitate healing.  After ambulating pt's BP = 154/96 mmHg & HR = 83 bpm.  Pt demonstrated stair negotiation, but required cuing for sequencing, as pt attempted to ascend steps leading with impaired RLE first.  Pt able to negotiate 4 steps with bilateral railings with supervision assistance; BP after task = 136/87 mmHg & HR = 86 bpm. Pt then ambulated 50 ft with supervision assistance back to room. Pt performed BLE AROM long arc quads 2 sets of 15 reps. PT educated pt to perform long arc quads & ankle pumps throughout the day to assist with edema reduction; RLE edema>LLE.  Pt left in w/c with all needs met, call bell within reach, & RLE elevated.    Treatment 2: Pt received in bathroom; assisted out of bathroom.  Pt without any c/o pain, but reported stiffness in RLE.  Pt ambulated 69 ft from room with bariatric RW & supervision assistance. Pt demonstrated decreased step length bilaterally & decreased foot clearance in LLE. Pt educated on need to  maintain PWB RLE until clearance given from orthopedic doctor to upgrade weight-bearing status; pt agreeable.  After ambulating pt's BP = 131/88 mmHg, HR = 92 bpm.  Pt assisted onto nu-step but reported significant tightness in RLE when attempting to utilize bike; pt assisted off of nu-step. Pt reported LLE appears back to baseline size today.  Pt boxed in standing position & tolerated standing for ~3 minutes, with intermittent BUE support on RW for balance.  After activity pt's BP = 140/101 mmHg, HR = 101 bpm. Pt returned to room & left in w/c with all needs met, call bell within reach, & RLE elevated.  Pt's BP at rest at end of tx session = 138/87 mmHg.   Therapy Documentation Precautions:  Precautions Precautions: Other (comment) (PWB RLE) Restrictions Weight Bearing Restrictions: Yes RLE Weight Bearing: Partial weight bearing RLE Partial Weight Bearing Percentage or Pounds: 50% Pain: Pain Assessment Pain Assessment: No/denies pain   See Function Navigator for Current Functional Status.   Therapy/Group: Individual Therapy  Waunita Schooner, PT, DPT 02/10/2016, 12:51 PM

## 2016-02-10 NOTE — Progress Notes (Signed)
Patient now reporting that he's WBAT. Called Dr. Greig Right office for confirmation--he was advanced to [facility] on 01/20/16.

## 2016-02-11 ENCOUNTER — Inpatient Hospital Stay (HOSPITAL_COMMUNITY): Payer: Medicaid Other

## 2016-02-11 ENCOUNTER — Inpatient Hospital Stay (HOSPITAL_COMMUNITY): Payer: Self-pay | Admitting: Physical Therapy

## 2016-02-11 NOTE — Progress Notes (Signed)
Physical Therapy Session Note  Patient Details  Name: Todd Parrish MRN: [id number] Date of Birth: 1981/02/18  Today's Date: 02/11/2016 PT Individual Time: 1000-1049 PT Individual Time Calculation (min): 49 min   Short Term Goals: Week 1:  PT Short Term Goal 1 (Week 1): STG = LTG  Skilled Therapeutic Interventions/Progress Updates:    Patient received semirecumbent in bed. Patient was able to perform bed mobility with supervision assist using L side bed rails with increased time. Patient educated on updated WB status to WBAT. Patient performed gait training for 65x2 with supervision assist from PT and RW; verbal instructed provided to improve step length on the L LE and increase weight bearing on the R. Patients vitals were taken following gait training, with BP of 161/95; after 3 minute rest break BP 156/98. 39m gait speed taken: 0.70m/s. Stair training performed to ascend and descend 4 stairs; supervision assist provided by PT with min verbal instruction for improved technique and step to gait pattern. PT also instructed patient in car transfer x2 with supervision assist; min verbal instruction for proper positioning provided by PT. Patient was able to take up to 3 steps without assistive device with no increase in pain following stair training.   Therapy Documentation Precautions:  Precautions Precautions: Other (comment) (PWB RLE) Restrictions Weight Bearing Restrictions: Yes RLE Weight Bearing: Weight bearing as tolerated RLE Partial Weight Bearing Percentage or Pounds: 50% General:   Vital Signs:  Pulse Rate: 91 Resp: 16 BP: (!) 161/95 mmHg Patient Position (if appropriate): sitting following activity  Pain: Pain Assessment Pain Assessment: 0-10 Pain Score: 2    See Function Navigator for Current Functional Status.   Therapy/Group: Individual Therapy  Golden Pop 02/11/2016, 2:49 PM

## 2016-02-11 NOTE — Progress Notes (Signed)
Occupational Therapy Session Note  Patient Details  Name: Todd Parrish MRN: [id number] Date of Birth: 1981/02/03  Today's Date: 02/11/2016 OT Individual Time: 4098-1191 OT Individual Time Calculation (min): 75 min    Short Term Goals: Week 1:  OT Short Term Goal 1 (Week 1): Pt will complete tub/shower transfer using bath bench and LRAD with min assist OT Short Term Goal 2 (Week 1): Pt will complete simple meal prep using RW with supervision OT Short Term Goal 3 (Week 1): Pt will complete 2 grooming tasks standing at sink for 3 minutes OT Short Term Goal 4 (Week 1): Pt will bathe using AE with supervision OT Short Term Goal 5 (Week 1): Pt will complete lower body dressing using AE, prn, with min assist  Skilled Therapeutic Interventions/Progress Updates: ADL-retraining with focus on lower body dressing, foot care, AE retraining (reacher and soft sock aid).   Pt received supine in bed and able to rise to sit at EOB unsupported using bed rails and HOB to self-feed light breakfast.  Pt deferred bathing d/t shower previous day but agreed to instruction on use of AE to improve lower body dressing skills.   OT educated pt on use of reacher and sock aid to don pants and socks.   Pt able to use soft sock aid with right leg supported by bed to improve reach.   Pt requested assist with foot care (washing and apply lotion) d/t observed scaly and dry skin.   After setup to provide wash basins to soak feet, pt completed drying his right foot using towel with 50% thoroughness.   OT provided assist to wash and apply lotion as pt is unable to reach his right foot effectively d/t stiffness and habitus (obese with hernia).    After foot care pt complained of upset stomach d/t poor positioning while sitting at EOB and requested return to bed to rest.   RN alerted to need for continued care.   Pt left in bed at end of session with all needs placed within reach.  Therapy Documentation Precautions:   Precautions Precautions: Other (comment) (PWB RLE) Restrictions Weight Bearing Restrictions: Yes RLE Weight Bearing: Weight bearing as tolerated RLE Partial Weight Bearing Percentage or Pounds: 50%     Pain: Pain Assessment Pain Assessment: No/denies pain  ADL: ADL ADL Comments: see Functional Assessment Tool   See Function Navigator for Current Functional Status.   Therapy/Group: Individual Therapy   Second session: Time: 1115-1200 Time Calculation (min):  45 min  Pain Assessment: No/denies pain  Skilled Therapeutic Interventions: Therapeutic activity with focus on improved endurance, dynamic standing balance, functional mobility using RW, toileting, and discharge planning.   Pt escorted to gift shop and lobby area for dynamic standing and endurance activity to enhance functional mobility.   Pt used RW independently to stand in line, place order, and complete payment for desired items unassisted, approx 7 min standing.   Pt received in w/c to recover and returned to his room.   Pt then requested to toilet and using RW ambulated to toilet to stand to urinate and then ambulate to sink to wash his hands standing with only standby assist for safety.   Pt recovered to w/c to rest with setup for self-feeding and all needs within reach.   Pt educated on need to plan discharge to include use of DME; pt affirms need for bath bench and will attempt showering in tub/shower during next session.   See FIM for current functional status  Therapy/Group: Individual Therapy  Morayo Leven 02/11/2016, 9:16 AM

## 2016-02-11 NOTE — Progress Notes (Signed)
[facility] PHYSICAL MEDICINE & REHABILITATION     PROGRESS NOTE  Subjective/Complaints:  Pt sitting up in bed this AM about to begin therapies.  He is pleased that his WB restrictions were removed.    ROS: Denies CP, SOB, N/V/D.   Objective: Vital Signs: Blood pressure 131/61, pulse 88, temperature 98.2 F (36.8 C), temperature source Oral, resp. rate 18, height  (1.702 m), weight 154.994 kg (341 lb 11.2 oz), SpO2 98 %. No results found.  Recent Labs  02/10/16 0802  WBC 9.1  HGB 9.5*  HCT 30.6*  PLT 366    Recent Labs  02/10/16 0802  NA 139  K 4.5  CL 104  GLUCOSE 96  BUN 9  CREATININE 0.99  CALCIUM 8.8*   CBG (last 3)  No results for input(s): GLUCAP in the last 72 hours.  Wt Readings from Last 3 Encounters:  02/06/16 154.994 kg (341 lb 11.2 oz)  02/01/16 155 kg (341 lb 11.4 oz)  01/14/16 157.852 kg (348 lb)    Physical Exam:  BP 131/61 mmHg  Pulse 88  Temp(Src) 98.2 F (36.8 C) (Oral)  Resp 18  Ht  (1.702 m)  Wt 154.994 kg (341 lb 11.2 oz)  BMI 53.51 kg/m2  SpO2 98% Constitutional: He appears well-developed and well-nourished. No distress.  Vital signs reviewed.  HENT: Normocephalic and atraumatic.  Eyes: Conjunctivae and EOM are normal.   Cardiovascular: Normal rate and regular rhythm.No murmur heard. Respiratory: Effort normal and breath sounds normal. No respiratory distress.  GI: Soft. Bowel sounds are normal. He exhibits no distension. There is no tenderness.  Musculoskeletal: He exhibits edema (BLE).  Well healed incision right shin. Neurological: He is alert and oriented.  Speech clear.  Follows commands without difficulty.  Motor: B/l UE 5/5 proximal to distal LLE: 4+/5 proximal to distal RLE: 4/5 hip flexion, 4+/5 knee extension, 5/5 ankle dorsi/plantar flexion Skin: Skin is warm and dry.  Psychiatric: He has a normal mood and affect. His behavior is normal. Thought content normal.   Assessment/Plan: 1. Functional deficits  secondary to submassive PE and morbid obesity which require 3+ hours per day of interdisciplinary therapy in a comprehensive inpatient rehab setting. Physiatrist is providing close team supervision and 24 hour management of active medical problems listed below. Physiatrist and rehab team continue to assess barriers to discharge/monitor patient progress toward functional and medical goals.  Function:  Bathing Bathing position   Position: Shower  Bathing parts Body parts bathed by patient: Right arm, Left arm, Chest, Abdomen, Front perineal area, Buttocks, Right upper leg, Left upper leg Body parts bathed by helper: Buttocks, Back  Bathing assist Assist Level: Set up   Set up : To obtain items  Upper Body Dressing/Undressing Upper body dressing   What is the patient wearing?: Pull over shirt/dress     Pull over shirt/dress - Perfomed by patient: Thread/unthread right sleeve, Thread/unthread left sleeve, Put head through opening, Pull shirt over trunk          Upper body assist Assist Level: Set up   Set up : To obtain clothing/put away  Lower Body Dressing/Undressing Lower body dressing   What is the patient wearing?: Underwear, Pants, Non-skid slipper socks Underwear - Performed by patient: Pull underwear up/down Underwear - Performed by helper: Thread/unthread right underwear leg, Thread/unthread left underwear leg Pants- Performed by patient: Pull pants up/down Pants- Performed by helper: Thread/unthread right pants leg, Thread/unthread left pants leg   Non-skid slipper socks- Performed by  helper: Don/doff right sock, Don/doff left sock                  Lower body assist Assist for lower body dressing: Touching or steadying assistance (Pt > 75%)      Toileting Toileting Toileting activity did not occur: Safety/medical concerns Toileting steps completed by patient: Adjust clothing prior to toileting, Performs perineal hygiene, Adjust clothing after  toileting Toileting steps completed by helper: Adjust clothing after toileting    Toileting assist Assist level: Supervision or verbal cues   Transfers Chair/bed transfer   Chair/bed transfer method: Stand pivot Chair/bed transfer assist level: Touching or steadying assistance (Pt > 75%) Chair/bed transfer assistive device: Patent attorney Ambulation activity did not occur:  (bariatric)   Max distance: 55 Assist level: Supervision or verbal cues   Wheelchair Wheelchair activity did not occur: Safety/medical concerns (c/o sternal pain when attempting to propel w/c with BUE)   Max wheelchair distance: 150 Assist Level: Dependent (Pt equals 0%)  Cognition Comprehension Comprehension assist level: Follows complex conversation/direction with extra time/assistive device  Expression Expression assist level: Expresses basic needs/ideas: With no assist  Social Interaction Social Interaction assist level: Interacts appropriately 90% of the time - Needs monitoring or encouragement for participation or interaction.  Problem Solving Problem solving assist level: Solves basic problems with no assist  Memory Memory assist level: More than reasonable amount of time    Medical Problem List and Plan: 1. Debility secondary to submassive PE and morbid obesity.   Continue CIR 2. Submassive PE/DVT Prophylaxis/Anticoagulation: Mechanical: Sequential compression devices, below knee Bilateral lower extremities. Has IVC filter in place and to follow up with IVR in 8-12 weeks. Follow up with Pulmonary in 3 months. No chemical anticoagulation due to retroperitoneal bleed. 3. Pain Management: Ultram prn for pain.  4. Mood: LCSW to follow for evaluation and support.  5. Neuropsych: This patient is capable of making decisions on his own behalf. 6. Skin/Wound Care: routine pressure relief measures.  7. Fluids/Electrolytes/Nutrition: Monitor I/Os.  8. Aspiration PNA:   Doxycyline  completed 9. Retroperitoneal bleed with ABLA: H/H stable after transfusion with 2 units.  Added iron for supplement.              -Hb 9.5 on 2/20 10. Cardiac arrest/PEA: Due to submassive PE. (see #2) 11. Large scrotal hernia: Follow up with PMD for referral to surgery in as outpt.  12. Morbid obesity: have allowed a regular diet  -discussed dietary choices, avoidance of excessive carbs  -he says that [place] road and fast food has been what's easy/available---understands that he needs to eat better  13. Right bicondylar tibial plateau fracture s/p fasciotomy RLE and ORIF  WBAT now.  14. H/o HIV: Followed by Dr. Ninetta Lights.  15. Prediabetes: Hgb A1c-6.0. Dietician educate patient on appropriate diet.  16. AKI: Resolved   CR 0.99 on 2/20  LOS (Days) 5 A FACE TO FACE EVALUATION WAS PERFORMED  Iva Posten Karis Juba 02/11/2016 8:40 AM

## 2016-02-11 NOTE — Progress Notes (Signed)
Physical Therapy Session Note  Patient Details  Name: Todd Parrish MRN: [id number] Date of Birth: 12-Mar-1981  Today's Date: 02/11/2016 PT Individual Time: 7829-5621 PT Individual Time Calculation (min): 31 min   Short Term Goals: Week 1:  PT Short Term Goal 1 (Week 1): STG = LTG  Skilled Therapeutic Interventions/Progress Updates:   Pt still reporting significant edema and tightness in R lower leg.  Pt agreeable to use of Kinesiotape for edema management on RLE.  Pt performed transfers supine > sit and stand pivot to w/c with bed rail and supervision.  Pt transported to gym in w/c total A.  Pt educated on purpose, placement and use of Kinesiotape.  Pt's R lower leg cleaned with alcohol pad and water to improve adherence of tape.  Tape applied to anterior R lower leg to facilitate increased circulation, fluid reabsorption and for edema management. Pt R lower leg girth measured at mid-calf = 21 inches.  Pt returned to room and transferred back to bed and to supine with bed rails with supervision.  Will re-assess LE size tomorrow pm.    Therapy Documentation Precautions:  Precautions Precautions: Other (comment) (PWB RLE) Restrictions Weight Bearing Restrictions: Yes RLE Weight Bearing: Weight bearing as tolerated RLE Partial Weight Bearing Percentage or Pounds: 50% Vital Signs: Therapy Vitals Temp: 98.3 F (36.8 C) Temp Source: Oral Pulse Rate: 91 Resp: 16 BP: (!) 151/84 mmHg Patient Position (if appropriate): Lying Oxygen Therapy SpO2: 100 % O2 Device: Not Delivered Pain:  No c/o pain  See Function Navigator for Current Functional Status.   Therapy/Group: Individual Therapy  [name] [facility] 02/11/2016, 4:28 PM
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
## 2016-02-12 ENCOUNTER — Inpatient Hospital Stay (HOSPITAL_COMMUNITY): Payer: Self-pay | Admitting: Physical Therapy

## 2016-02-12 ENCOUNTER — Inpatient Hospital Stay (HOSPITAL_COMMUNITY): Payer: Self-pay | Admitting: Occupational Therapy

## 2016-02-12 DIAGNOSIS — K130 Diseases of lips: Secondary | ICD-10-CM

## 2016-02-12 MED ORDER — WHITE PETROLATUM GEL
Status: AC
  Administered 2016-02-12: 0.2
  Filled 2016-02-12: qty 1

## 2016-02-12 NOTE — Progress Notes (Signed)
Occupational Therapy Session Note  Patient Details  Name: Todd Parrish MRN: [id number] Date of Birth: January 16, 1981  Today's Date: 02/12/2016 OT Individual Time: 1100-1130 OT Individual Time Calculation (min): 30 min    Short Term Goals: Week 1:  OT Short Term Goal 1 (Week 1): Pt will complete tub/shower transfer using bath bench and LRAD with min assist OT Short Term Goal 2 (Week 1): Pt will complete simple meal prep using RW with supervision OT Short Term Goal 3 (Week 1): Pt will complete 2 grooming tasks standing at sink for 3 minutes OT Short Term Goal 4 (Week 1): Pt will bathe using AE with supervision OT Short Term Goal 5 (Week 1): Pt will complete lower body dressing using AE, prn, with min assist  Skilled Therapeutic Interventions/Progress Updates:    1:1 Engaged in functional mobility including functional ambulation with RW and transfers without AD. Pt transferred from w/c to standing infront of toilet with supervision without AD and voiding in standing with distant supervision.  Ambulated to sink with RW for hand hygiene and around room to gather clothing and items for shower tomorrow morning with distant supervision with extra time.  Issued walker bag for increased ability to carry multiple things in room. Pt perform right LE gentle ROm for hamstrings and calf stretch.   Therapy Documentation Precautions:  Precautions Precautions: Other (comment) (PWB RLE) Restrictions Weight Bearing Restrictions: Yes RLE Weight Bearing: Weight bearing as tolerated RLE Partial Weight Bearing Percentage or Pounds: 50% Pain: Pain Assessment Pain Assessment: 0-10 Pain Score: 2  Pain Location: Leg Pain Orientation: Right Pain Descriptors / Indicators: Other (Comment) (stuff) ADL: ADL ADL Comments: see Functional Assessment Tool  See Function Navigator for Current Functional Status.   Therapy/Group: Individual Therapy  Roney Mans [facility] 02/12/2016, 12:21 PM

## 2016-02-12 NOTE — Progress Notes (Signed)
Physical Therapy Session Note  Patient Details  Name: Todd Parrish MRN: [id number] Date of Birth: [birth year]  Today's Date: 02/12/2016 PT Individual Time: 0901-1003 PT Individual Time Calculation (min): 62 min   Short Term Goals: Week 1:  PT Short Term Goal 1 (Week 1): STG = LTG  Skilled Therapeutic Interventions/Progress Updates:    Patient received sitting EOB with bedside table. Gait training with RW up to 75 ft x2 supervision assist, cues from PT to increase weight bearing on the R LE and increase step length on the L LE. PB taken following gait training; 148/103. Retaken after 2 minute rest break; 145/91. Dynamic balance training with weight shift and trunk rotation left and right with overhead reach 6reps x 3sets each direction. Endurance training on the Nustep level 1 x 5 minutes, with cues to slowly progress movement of the R LE due to increased pressure from edema. Patient was able to achieve~ 45 degrees knee flexion and~ 5 degrees of DF on the R ankle. Pt reports decreased "stiffness" in R LE following Nustep. Gait training performed with [facility] and HHA on hall rail x 5 feet, CGA assist and cues for step to technique and AD management. Gait training also perform with [facility] for 5 ft, CGA from PT as well as cues from increased weight bearing on the R LE, proper step to gait pattern, and AD management. Patient returned to room seated in wheel chair with call bell.   Therapy Documentation Precautions:  Precautions Precautions: Other (comment) (PWB RLE) Restrictions Weight Bearing Restrictions: Yes RLE Weight Bearing: Weight bearing as tolerated General:   Vital Signs: Therapy Vitals Pulse Rate: 100 BP: (!) 148/103 mmHg Pain: Pain Assessment Pain Assessment: 0-10 Pain Score: 2  Pain Location: Leg Pain Orientation: Right Pain Descriptors / Indicators: Other (Comment) (stuff)   See Function Navigator for Current Functional Status.   Therapy/Group: Individual  Therapy  Golden Pop, PT, DPT 02/12/2016, 10:22 AM

## 2016-02-12 NOTE — Progress Notes (Signed)
Todd [facility] PHYSICAL MEDICINE & REHABILITATION     PROGRESS NOTE  Subjective/Complaints:  Since seen lying in bed this morning. She did well with therapies after being allowed full weightbearing on his right lower extremity. He complains of the lip sore.    ROS: Denies CP, SOB, N/V/D.   Objective: Vital Signs: Blood pressure 148/103, pulse 100, temperature 98.3 F (36.8 C), temperature source Oral, resp. rate 16, height  (1.702 m), weight 154.994 kg (341 lb 11.2 oz), SpO2 96 %. No results found.  Recent Labs  02/10/16 0802  WBC 9.1  HGB 9.5*  HCT 30.6*  PLT 366    Recent Labs  02/10/16 0802  NA 139  K 4.5  CL 104  GLUCOSE 96  BUN 9  CREATININE 0.99  CALCIUM 8.8*   CBG (last 3)  No results for input(s): GLUCAP in the last 72 hours.  Wt Readings from Last 3 Encounters:  02/06/16 154.994 kg (341 lb 11.2 oz)  02/01/16 155 kg (341 lb 11.4 oz)  01/14/16 157.852 kg (348 lb)    Physical Exam:  BP 148/103 mmHg  Pulse 100  Temp(Src) 98.3 F (36.8 C) (Oral)  Resp 16  Ht  (1.702 m)  Wt 154.994 kg (341 lb 11.2 oz)  BMI 53.51 kg/m2  SpO2 96% Constitutional: He appears well-developed and well-nourished. No distress.  Vital signs reviewed.  HENT: Normocephalic and atraumatic.  Eyes: Conjunctivae and EOM are normal.   Cardiovascular: Normal rate and regular rhythm.No murmur heard. Respiratory: Effort normal and breath sounds normal. No respiratory distress.  GI: Soft. Bowel sounds are normal. He exhibits no distension. There is no tenderness.  Musculoskeletal: He exhibits edema (BLE).  Well healed incision right shin. Neurological: He is alert and oriented.  Speech clear.  Follows commands without difficulty.  Motor: B/l UE 5/5 proximal to distal LLE: 4+/5 proximal to distal RLE: 4/5 hip flexion, 4+/5 knee extension, 5/5 ankle dorsi/plantar flexion Skin: Skin is warm and dry. Right lateral lip ulcer.  Psychiatric: He has a normal mood and affect. His  behavior is normal. Thought content normal.   Assessment/Plan: 1. Functional deficits secondary to submassive PE and morbid obesity which require 3+ hours per day of interdisciplinary therapy in a comprehensive inpatient rehab setting. Physiatrist is providing close team supervision and 24 hour management of active medical problems listed below. Physiatrist and rehab team continue to assess barriers to discharge/monitor patient progress toward functional and medical goals.  Function:  Bathing Bathing position   Position: Shower  Bathing parts Body parts bathed by patient: Right arm, Left arm, Chest, Abdomen, Front perineal area, Buttocks, Right upper leg, Left upper leg Body parts bathed by helper: Buttocks, Back  Bathing assist Assist Level: Set up   Set up : To obtain items  Upper Body Dressing/Undressing Upper body dressing   What is the patient wearing?: Pull over shirt/dress     Pull over shirt/dress - Perfomed by patient: Thread/unthread right sleeve, Thread/unthread left sleeve, Put head through opening, Pull shirt over trunk          Upper body assist Assist Level: Set up   Set up : To obtain clothing/put away  Lower Body Dressing/Undressing Lower body dressing   What is the patient wearing?: Pants, Non-skid slipper socks Underwear - Performed by patient: Pull underwear up/down Underwear - Performed by helper: Thread/unthread right underwear leg, Thread/unthread left underwear leg Pants- Performed by patient: Pull pants up/down Pants- Performed by helper: Thread/unthread right pants leg, Thread/unthread  left pants leg Non-skid slipper socks- Performed by patient: Don/doff right sock, Don/doff left sock Non-skid slipper socks- Performed by helper: Don/doff right sock, Don/doff left sock                  Lower body assist Assist for lower body dressing: Touching or steadying assistance (Pt > 75%)      Toileting Toileting Toileting activity did not occur:  Safety/medical concerns Toileting steps completed by patient: Adjust clothing prior to toileting, Performs perineal hygiene, Adjust clothing after toileting Toileting steps completed by helper: Adjust clothing prior to toileting    Toileting assist Assist level: Supervision or verbal cues   Transfers Chair/bed transfer   Chair/bed transfer method: Ambulatory Chair/bed transfer assist level: Supervision or verbal cues Chair/bed transfer assistive device: Patent attorney Ambulation activity did not occur:  (bariatric)   Max distance: 62ft with RW, 10 ft with Large based quad cane  Assist level: Supervision or verbal cues   Wheelchair Wheelchair activity did not occur: Safety/medical concerns (c/o sternal pain when attempting to propel w/c with BUE)   Max wheelchair distance: 150 Assist Level: Dependent (Pt equals 0%)  Cognition Comprehension Comprehension assist level: Follows basic conversation/direction with no assist  Expression Expression assist level: Expresses complex ideas: With no assist  Social Interaction Social Interaction assist level: Interacts appropriately 90% of the time - Needs monitoring or encouragement for participation or interaction.  Problem Solving Problem solving assist level: Solves basic problems with no assist  Memory Memory assist level: More than reasonable amount of time    Medical Problem List and Plan: 1. Debility secondary to submassive PE and morbid obesity.   Continue CIR 2. Submassive PE/DVT Prophylaxis/Anticoagulation: Mechanical: Sequential compression devices, below knee Bilateral lower extremities. Has IVC filter in place and to follow up with IVR in 8-12 weeks. Follow up with Pulmonary in 3 months. No chemical anticoagulation due to retroperitoneal bleed. 3. Pain Management: Ultram prn for pain.  4. Mood: LCSW to follow for evaluation and support.  5. Neuropsych: This patient is capable of making decisions on his own  behalf. Appreciate neuropsych eval, patient doing relatively well. 6. Skin/Wound Care: routine pressure relief measures.  7. Fluids/Electrolytes/Nutrition: Monitor I/Os.  8. Aspiration PNA:   Doxycyline completed 9. Retroperitoneal bleed with ABLA: H/H stable after transfusion with 2 units.  Added iron for supplement.              -Hb 9.5 on 2/20 10. Cardiac arrest/PEA: Due to submassive PE. (see #2) 11. Large scrotal hernia: Follow up with PMD for referral to surgery in as outpt.  12. Morbid obesity: have allowed a regular diet  -discussed dietary choices, avoidance of excessive carbs  -[place] says [place] and fast food has been what's easy/available---understands that he needs to eat better  13. Right bicondylar tibial plateau fracture s/p fasciotomy RLE and ORIF  WBAT.  14. H/o HIV: Followed by Dr. Ninetta Lights.  15. Prediabetes: Hgb A1c-6.0. Dietician educate patient on appropriate diet.  16. AKI: Resolved   CR 0.99 on 2/20  17. Lip ulcer  Will provide Vaseline.  LOS (Days) 6 A FACE TO FACE EVALUATION WAS PERFORMED  Suriyah Vergara Karis Juba 02/12/2016 10:51 AM

## 2016-02-12 NOTE — Progress Notes (Signed)
Social Work Patient ID: Todd Parrish, male   DOB: March 28, 1981, 35 y.o.   MRN: [id number] Met with pt to discuss team conference goals mod/i level and discharge 2/28. He feels he will be ready by then, he is not quite yet so glad will have until Tuesday. Aware of his equipment needs-bariatric walker and tub bench. Will also arranged follow up if eligible for this at home. Pt appears brighter and happy to have a target date for home.

## 2016-02-12 NOTE — Patient Care Conference (Signed)
Inpatient RehabilitationTeam Conference and Plan of Care Update Date: 02/12/2016   Time: 2:15 PM    Patient Name: Todd Parrish      Medical Record Number: [id number]  Date of Birth: April 05, 1981 Sex: Male         Room/Bed: 4W15C/4W15C-01 Payor Info: Payor: MEDICAID PENDING / Plan: MEDICAID PENDING / Product Type: *No Product type* /    Admitting Diagnosis: debility  Admit Date/Time:  02/06/2016  3:06 PM Admission Comments: No comment available   Primary Diagnosis:  <principal problem not specified> Principal Problem: <principal problem not specified>  Patient Active Problem List   Diagnosis Date Noted  . Lip ulcer   . Pulmonary embolism without acute cor pulmonale (HCC)   . Aspiration pneumonia of right lung (HCC)   . Tibial plateau fracture   . Physical debility 02/06/2016  . Bleeding   . Blood clot in vein   . Vertigo   . HIV disease (HCC)   . Pre-diabetes   . Acute blood loss anemia   . Retroperitoneal bleed   . Blood loss anemia   . PEA (Pulseless electrical activity) (HCC)   . Respiratory failure (HCC)   . SOB (shortness of breath)   . Metabolic acidosis, increased anion gap   . AKI (acute kidney injury) (HCC)   . Pulmonary embolism on left (HCC)   . Pulmonary embolism (HCC)   . Cardiac arrest (HCC) 01/29/2016  . Neuropathy (HCC) 12/23/2015  . Benign paroxysmal positional vertigo 12/23/2015  . Morbid obesity (HCC) 12/12/2015  . Constipation due to opioid therapy 12/03/2015  . Seasonal allergies 12/03/2015  . Human immunodeficiency virus (HIV) disease (HCC) 11/30/2015  . Left inguinal hernia   . Right medial tibial plateau fracture 11/25/2015    Expected Discharge Date: Expected Discharge Date: 02/18/16  Team Members Present: Physician leading conference: Dr. Maryla Morrow Social Worker Present: Dossie Der, LCSW Nurse Present: Carmie End, RN PT Present: [name], PT OT Present: Roney Mans, OT SLP Present: Jackalyn Lombard, SLP PPS Coordinator  present : Tora Duck, RN, CRRN     Current Status/Progress Goal Weekly Team Focus  Medical   Debility secondary to submassive PE and morbid obesity with lymphedema, DVT, retroperitoneal bleed  Improve mobility, monitor labs  see above   Bowel/Bladder   continent of bowel and bladder. LBM 2/19.   remain continent of bowel and bladder with mod I assist   Offer toileting q 2 hours while awake. Encourage use of stool softners    Swallow/Nutrition/ Hydration     na        ADL's   Overall mod assist for BADL (setup for upper body, mod A for lower), supervision for dynamic standing balance  Mod I  Improved activity tolerance, AE training, energy conservation, dynamic standing balance, pain management, general strengthening   Mobility   Min/supervision assist with gait using bariatric RW up to 65 ft. Supervision assist with Ascend/descend stair with cues for technique and Bilat handrails. Patient was able to transfer into car at Mod I.    Mod I for all mobility tasks and ambulate up to 125 ft with Bariatric RW(increased due to enhanced WB status.   increased endurance and activity tolerance, improved balance and indepedence with mobility tasks.    Communication     na        Safety/Cognition/ Behavioral Observations    no unsafe behaviors        Pain   occasional complaints of pain to back. Tyelenol Q4  prn.   <4   Assess pain q 4 hours and prn.    Skin   clean and dry. Edema to bilateral lower extremities .   No new skin breakdown and infection while on rehab   Assess skin q shift and prn       *See Care Plan and progress notes for long and short-term goals.  Barriers to Discharge: DVT, pt safety    Possible Resolutions to Barriers:  Monitor labs and signs of bleed, pt edu, recent WB restrictions removed    Discharge Planning/Teaching Needs:  Home with intermittent checks from family and friends. Needs to be mod/i level      Team Discussion:  Goals-mod/i level-working on endurance,  strengthening and pain issues. Taped for edema in leg. BP high-MD made aware and adjusting. Will need equipment and follow up therapies at home.  Revisions to Treatment Plan:  None   Continued Need for Acute Rehabilitation Level of Care: The patient requires daily medical management by a physician with specialized training in physical medicine and rehabilitation for the following conditions: Daily direction of a multidisciplinary physical rehabilitation program to ensure safe treatment while eliciting the highest outcome that is of practical value to the patient.: Yes Daily medical management of patient stability for increased activity during participation in an intensive rehabilitation regime.: Yes Daily analysis of laboratory values and/or radiology reports with any subsequent need for medication adjustment of medical intervention for : Post surgical problems;Nutritional problems  Aseel Uhde, Lemar Livings 02/12/2016, 3:41 PM

## 2016-02-12 NOTE — Progress Notes (Addendum)
Physical Therapy Session Note  Patient Details  Name: Todd Parrish MRN: [id number] Date of Birth: 08/23/1981  Today's Date: 02/12/2016 PT Individual Time: 1300-1400  And 1428-1500  PT Individual Time Calculation (min): 60 min and 32 minutes  Short Term Goals: Week 1:  PT Short Term Goal 1 (Week 1): STG = LTG  Skilled Therapeutic Interventions/Progress Updates:    Treatment 1: Pt received in w/c & agreeable to PT.  Pt denied c/o pain.  Pt ambulated 100 ft + 100 ft with RW & supervision, progressing to Mod I, with 1 seated rest break in between; BP = 151/92 mmHg. Pt demonstrated decreased BLE step length, LLE>RLE, and decreased hip & knee flexion LLE. Pt also ambulating with step-to gait pattern leading with RLE. PT demonstrated & educated pt on reciprocal gait pattern with increased LLE step length & foot clearance. Pt able to demonstrate this gait pattern intermittently during ambulation, with verbal cuing from PT. Pt then performed car transfer, at height of 24 inches.  Pt did not transfer BLE into car 2/2 decreased space due to pt's body habitus and high footboard.  Pt negotiated 4 steps with 2 trials with supervision progressing to Mod I; BP = 135/78 mmHg.  Pt recalled stair negotiation sequence without assistance and only required minimal cuing from PT to place entire foot on each step to increase his balance during task. Pt then ambulated 125 ft with RW and supervision/Mod I back to his room, with 2 standing rest breaks; BP = 154/89. Pt then left in w/c with RLE elevated & all needs met & within reach.   Treatment 2: Pt received in w/c, voicing needs to go to the bathroom. Pt ambulated to bathroom with RW & supervision, and performed standing toileting task with distant supervision.  Pt's RLE circumference measured to be 20 1/4 inches (decreased 3/4 inches from yesterday's measurement of 21 inches). Pt transported to hallway via w/c & educated on use of [facility] for ambulation. Pt ambulated  30 ft with LBQC in LUE and intermittent support from hallway rail with RUE. Pt required CGA assist 2/2 decreased balance, decreased step length LLE, and decreased foot clearance LLE. Pt's BP = 142/86 mmHg.  Pt encouraged not to utilize hallway rail & was able to ambulate 30 ft a second time with decreased support from rail, CGA from PT, and continued impaired balance/lateral sway.  Pt then utilized BUE to propel w/c ~100 ft with supervision for increased cardiovascular endurance back towards his room. Pt reported 2-3/10 chest soreness. At the end of session pt was left in w/c with RLE elevated & all needs met & within reach.   Therapy Documentation Precautions:  Precautions Precautions: Other (comment) (WBAT RLE) Restrictions Weight Bearing Restrictions: Yes RLE Weight Bearing: Weight bearing as tolerated    See Function Navigator for Current Functional Status.   Therapy/Group: Individual Therapy  Waunita Schooner 02/12/2016, 3:07 PM

## 2016-02-13 ENCOUNTER — Inpatient Hospital Stay (HOSPITAL_COMMUNITY): Payer: Medicaid Other

## 2016-02-13 ENCOUNTER — Inpatient Hospital Stay (HOSPITAL_COMMUNITY): Payer: Self-pay | Admitting: Physical Therapy

## 2016-02-13 DIAGNOSIS — I10 Essential (primary) hypertension: Secondary | ICD-10-CM | POA: Diagnosis present

## 2016-02-13 MED ORDER — TRIAMCINOLONE ACETONIDE 0.1 % EX LOTN
TOPICAL_LOTION | Freq: Two times a day (BID) | CUTANEOUS | Status: DC
  Filled 2016-02-13: qty 60

## 2016-02-13 MED ORDER — BETAMETHASONE VALERATE 0.1 % EX LOTN
TOPICAL_LOTION | Freq: Two times a day (BID) | CUTANEOUS | Status: DC
  Filled 2016-02-13: qty 60

## 2016-02-13 MED ORDER — LISINOPRIL 10 MG PO TABS
10.0000 mg | ORAL_TABLET | Freq: Every day | ORAL | Status: DC
  Administered 2016-02-14 – 2016-02-20 (×7): 10 mg via ORAL
  Filled 2016-02-13 (×7): qty 1

## 2016-02-13 MED ORDER — TRIAMCINOLONE ACETONIDE 0.1 % EX CREA
TOPICAL_CREAM | Freq: Two times a day (BID) | CUTANEOUS | Status: DC
  Administered 2016-02-13: 13:00:00 via TOPICAL
  Administered 2016-02-14: 1 via TOPICAL
  Administered 2016-02-14 – 2016-02-19 (×6): via TOPICAL
  Filled 2016-02-13: qty 15

## 2016-02-13 NOTE — Progress Notes (Signed)
Occupational Therapy Session Note  Patient Details  Name: Todd Parrish MRN: [id number] Date of Birth: 08/13/1981  Today's Date: 02/13/2016 OT Individual Time: 1005-1105 OT Individual Time Calculation (min): 60 min    Short Term Goals: Week 1:  OT Short Term Goal 1 (Week 1): Pt will complete tub/shower transfer using bath bench and LRAD with min assist OT Short Term Goal 2 (Week 1): Pt will complete simple meal prep using RW with supervision OT Short Term Goal 3 (Week 1): Pt will complete 2 grooming tasks standing at sink for 3 minutes OT Short Term Goal 4 (Week 1): Pt will bathe using AE with supervision OT Short Term Goal 5 (Week 1): Pt will complete lower body dressing using AE, prn, with min assist  Skilled Therapeutic Interventions/Progress Updates: ADL-retraining with focus on improved use of AE for lower body dressing, endurance, and transfers.   Pt received seated in his w/c and reporting unusual fatigue this date.  With mod verbal encouragement, pt agreed to shower level BADL.   Pt required setup to gather clothing and prepare shower.  Pt refused tub/shower d/t room temp too cold in tub room but bathed unassisted to his satisfaction in his room using walk-in shower.   Pt completed washing peri-area and buttocks standing supported from tub bench.   Pt required re-ed on use of reacher to lace underwear and pants up right leg but pulled up both articles unassisted this date.   Pt completed BADL at overall min assist this session but requires mod vc to progress and problem-solve for greater independence.     Therapy Documentation Precautions:  Precautions Precautions: Other (comment) (PWB RLE) Restrictions Weight Bearing Restrictions: Yes RLE Weight Bearing: Weight bearing as tolerated RLE Partial Weight Bearing Percentage or Pounds: 50%  Pain: Pain Assessment Pain Assessment: 0-10 Pain Score: 3  Pain Location: Generalized Pain Descriptors / Indicators: Aching;Sore Pain  Intervention(s): Ambulation/increased activity;Repositioned  ADL: ADL ADL Comments: see Functional Assessment Tool   See Function Navigator for Current Functional Status.   Therapy/Group: Individual Therapy  Gevorg Brum 02/13/2016, 12:34 PM

## 2016-02-13 NOTE — Progress Notes (Signed)
Physical Therapy Session Note  Patient Details  Name: Todd Parrish MRN: [id number] Date of Birth: [birth year]  Today's Date: 02/13/2016 PT Individual Time: 9106-8166 PT Individual Time Calculation (min): 31 min   Short Term Goals: Week 1:  PT Short Term Goal 1 (Week 1): STG = LTG (Goals not met 2/2 pt experiencing high BP during first week, as well as poor endurance & activity tolerance during sessions.)  Skilled Therapeutic Interventions/Progress Updates:    Pt received sitting up in WC. Gait training performed with RW for 74f and 60 ft, PT provided supervision assist with cues to increase step length on the L LE and increase weight bearing on the R LE. [facility]. Patient performed bed mobility training including management of pillows and sheets as well as sit to supine and supine to sit. PT was required supervision assist and min-mod verbal instruction for LE management and proper use of UE to initiate roll. Patient performed WC mobility for endurance training and UE strengthening for 1071f Due to increased fatigue, patient required to Mod A for additional 50 ft to room. [facility]   Therapy Documentation Precautions:  Precautions Precautions: Other (comment) (PWB RLE) Restrictions Weight Bearing Restrictions: Yes RLE Weight Bearing: Weight bearing as tolerated RLE Partial Weight Bearing Percentage or Pounds: 50% General:    Pain Assessment Pain Assessment: No/denies pain  See Function Navigator for Current Functional Status.   Therapy/Group: Individual Therapy  AuLorie Phenix/23/2017, 3:45 PM

## 2016-02-13 NOTE — Progress Notes (Addendum)
Physical Therapy Weekly Progress Note  Patient Details  Name: Todd Parrish MRN: [id number] Date of Birth: 1980/12/30  Beginning of progress report period: February 06, 2016 End of progress report period: February 13, 2016  Today's Date: 02/13/2016 PT Individual Time: 4098-1191 and 1305-1404 PT Individual Time Calculation (min): 66 min and 59 minutes  Patient has met 3 of 6 long term goals.  Short term goals not set due to estimated length of stay.  Pt's length of stay extended due to pt's initial treatment sessions being limited by elevated BP & pt's poor activity tolerance.  Patient continues to demonstrate the following deficits: decreased endurance, significant edema RLE, impaired gait with AD, decreased balance, and continued need for extended time to complete functional tasks, and therefore will continue to benefit from skilled PT intervention to enhance overall performance with activity tolerance, ambulation, and balance.  See Patient's Care Plan for progression toward long term goals.  Patient progressing toward long term goals.  Plan of care revisions: Pt's long term goals upgraded to Mod I level, as pt has met supervision level for 3 out of 6 LTG.  Skilled Therapeutic Interventions/Progress Updates:    Treatment 1: Pt received sitting on EOB; agreeable to PT.  Pt noted 3/10 generalized ache/soreness.  Pt's BP 142/100 mmHg; RN cleared pt for PT, reporting she just administered BP medication. Pt ambulated 25 ft with RW & supervision then required seated rest break; BP = 150/88 mmHg.  Pt ambulated 63 ft with RW & supervision assistance demonstrating step-to gait pattern leading with RLE. PT demonstrated reciprocal gait pattern & educated pt on importance of progressing to this pattern.  Pt occasionally demonstrated reciprocal gait, but mostly step-to pattern. Pt c/o 4-5/10 soreness in R knee.  Pt appears to have difficulty weight bearing on RLE to allow LLE to perform step-through.   Pt tolerated static standing 3 1/2 minutes + 2 1/2 minutes without BUE support while playing Wii bowling.  Pt required seated rest break then able to tolerate 4 1/2 minutes standing without BUE support; focus of activity to increase pt's overall endurance and activity tolerance.  Pt ambulated 100 ft back to room with RW & supervision assistance. BP at end of session = 134/88 mmHg. Pt left with RLE elevated & all needs met & within reach. Pt with several reports of fatigue during today's session.   Treatment 2: Pt received sitting in w/c with BLE in gravity dependent position. Pt educated on importance of maintaining RLE elevation to help reduce edema.  Pt reported no c/o pain, only RLE stiffness.  Pt attempted ambulation with LBQC in LUE & support from hall railing in [place] but reported significant RLE stiffness limiting his abilities.  Pt educated on need to ambulate to help assist with edema & stiffness reduction.  Pt ambulated 110 ft with RW and supervision with PT providing max verbal cuing & visual demonstration on reciprocal gait pattern.  Pt intermittently demonstrated step-to gait pattern leading with RLE.  PT educated pt to reduce RLE step length & increase LLE step length to facilitate reciprocal gait.  Pt with improvement in gait pattern but would benefit from continued education.  Pt utilized nu-step on seat level 13, resistance level 1 for 12 minutes without any c/o pain & within comfortable RLE ROM.  Pt tolerated standing for 5 minutes while tossing beach ball with PT to focus on dynamic standing balance & standing endurance. Pt did not experience any loss of balance throughout task. Pt ambulated 160  ft with supervision & RW and improving reciprocal gait back to his room; pt only required 1 standing rest break & after task pt's BP = 141/85 mmHg, HR = 109 bpm. At the end of the session pt was left in the w/c with RLE elevated on leg rest & all needs within reach.    Therapy Documentation Precautions:   Precautions Precautions: Other (comment)  Restrictions Weight Bearing Restrictions: Yes RLE Weight Bearing: Weight bearing as tolerated  General:   Vital Signs: BP: (!) 142/100 mmHg Patient Position (if appropriate): sitting  Pain: Pain Assessment Pain Assessment: 0-10 Pain Score: 3  Pain Location: Generalized Pain Descriptors / Indicators: Aching;Sore Pain Intervention(s): Ambulation/increased activity;Repositioned   See Function Navigator for Current Functional Status.  Therapy/Group: Individual Therapy  Waunita Schooner, PT, DPT 02/13/2016, 9:21 AM

## 2016-02-13 NOTE — Progress Notes (Signed)
[place] PHYSICAL MEDICINE & REHABILITATION     PROGRESS NOTE  Subjective/Complaints:  Pt sitting at the edge of his bed.  He slept well last night and feels good this AM.    ROS: Denies CP, SOB, N/V/D.   Objective: Vital Signs: Blood pressure 148/100, pulse 89, temperature 98.6 F (37 C), temperature source Oral, resp. rate 16, height  (1.702 m), weight 153.4 kg (338 lb 3 oz), SpO2 97 %. No results found. No results for input(s): WBC, HGB, HCT, PLT in the last 72 hours. No results for input(s): NA, K, CL, GLUCOSE, BUN, CREATININE, CALCIUM in the last 72 hours.  Invalid input(s): CO CBG (last 3)  No results for input(s): GLUCAP in the last 72 hours.  Wt Readings from Last 3 Encounters:  02/12/16 153.4 kg (338 lb 3 oz)  02/01/16 155 kg (341 lb 11.4 oz)  01/14/16 157.852 kg (348 lb)    Physical Exam:  BP 148/100 mmHg  Pulse 89  Temp(Src) 98.6 F (37 C) (Oral)  Resp 16  Ht  (1.702 m)  Wt 153.4 kg (338 lb 3 oz)  BMI 52.95 kg/m2  SpO2 97% Constitutional: He appears well-developed and well-nourished. No distress.  Vital signs reviewed.  HENT: Normocephalic and atraumatic.  Eyes: Conjunctivae and EOM are normal.   Cardiovascular: Normal rate and regular rhythm.No murmur heard. Respiratory: Effort normal and breath sounds normal. No respiratory distress.  GI: Soft. Bowel sounds are normal. He exhibits no distension. There is no tenderness.  Musculoskeletal: He exhibits edema (BLE). Kineseotape in place. Well healed incision right shin. Neurological: He is alert and oriented.  Speech clear.  Follows commands without difficulty.  Motor: B/l UE 5/5 proximal to distal LLE: 4+/5 proximal to distal RLE: 4-/5 hip flexion, 4+/5 knee extension, 5/5 ankle dorsi/plantar flexion Skin: Skin is warm and dry. Right lateral lip ulcer.  Psychiatric: He has a normal mood and affect. His behavior is normal. Thought content normal.   Assessment/Plan: 1. Functional deficits  secondary to submassive PE and morbid obesity which require 3+ hours per day of interdisciplinary therapy in a comprehensive inpatient rehab setting. Physiatrist is providing close team supervision and 24 hour management of active medical problems listed below. Physiatrist and rehab team continue to assess barriers to discharge/monitor patient progress toward functional and medical goals.  Function:  Bathing Bathing position   Position: Shower  Bathing parts Body parts bathed by patient: Right arm, Left arm, Chest, Abdomen, Front perineal area, Buttocks, Right upper leg, Left upper leg Body parts bathed by helper: Buttocks, Back  Bathing assist Assist Level: Set up   Set up : To obtain items  Upper Body Dressing/Undressing Upper body dressing   What is the patient wearing?: Pull over shirt/dress     Pull over shirt/dress - Perfomed by patient: Thread/unthread right sleeve, Thread/unthread left sleeve, Put head through opening, Pull shirt over trunk          Upper body assist Assist Level: Set up   Set up : To obtain clothing/put away  Lower Body Dressing/Undressing Lower body dressing   What is the patient wearing?: Pants, Non-skid slipper socks Underwear - Performed by patient: Pull underwear up/down Underwear - Performed by helper: Thread/unthread right underwear leg, Thread/unthread left underwear leg Pants- Performed by patient: Pull pants up/down Pants- Performed by helper: Thread/unthread right pants leg, Thread/unthread left pants leg Non-skid slipper socks- Performed by patient: Don/doff right sock, Don/doff left sock Non-skid slipper socks- Performed by helper: Don/doff right  sock, Don/doff left sock                  Lower body assist Assist for lower body dressing: Touching or steadying assistance (Pt > 75%)      Toileting Toileting Toileting activity did not occur: Safety/medical concerns Toileting steps completed by patient: Adjust clothing prior to  toileting, Performs perineal hygiene, Adjust clothing after toileting Toileting steps completed by helper: Adjust clothing prior to toileting    Toileting assist Assist level: Supervision or verbal cues   Transfers Chair/bed transfer   Chair/bed transfer method: Ambulatory Chair/bed transfer assist level: Supervision or verbal cues Chair/bed transfer assistive device: Patent attorney Ambulation activity did not occur:  (bariatric)   Max distance: 30 FT Assist level: Touching or steadying assistance (Pt > 75%)   Wheelchair Wheelchair activity did not occur: Safety/medical concerns (c/o sternal pain when attempting to propel w/c with BUE) Type: Manual Max wheelchair distance: 100 FT Assist Level: No help, No cues, assistive device, takes more than reasonable amount of time  Cognition Comprehension Comprehension assist level: Follows basic conversation/direction with no assist  Expression Expression assist level: Expresses complex ideas: With no assist  Social Interaction Social Interaction assist level: Interacts appropriately 90% of the time - Needs monitoring or encouragement for participation or interaction.  Problem Solving Problem solving assist level: Solves basic problems with no assist  Memory Memory assist level: More than reasonable amount of time    Medical Problem List and Plan: 1. Debility secondary to submassive PE and morbid obesity.   Continue CIR 2. Submassive PE/DVT Prophylaxis/Anticoagulation: Mechanical: Sequential compression devices, below knee Bilateral lower extremities. Has IVC filter in place and to follow up with IVR in 8-12 weeks. Follow up with Pulmonary in 3 months. No chemical anticoagulation due to retroperitoneal bleed. 3. Pain Management: Ultram prn for pain.  4. Mood: LCSW to follow for evaluation and support.  5. Neuropsych: This patient is capable of making decisions on his own behalf. Appreciate neuropsych eval, patient  doing relatively well. 6. Skin/Wound Care: routine pressure relief measures.  7. Fluids/Electrolytes/Nutrition: Monitor I/Os.  8. Aspiration PNA:   Doxycyline completed 9. Retroperitoneal bleed with ABLA: H/H stable after transfusion with 2 units.  Added iron for supplement.              -Hb 9.5 on 2/20 10. Cardiac arrest/PEA: Due to submassive PE. (see #2) 11. Large scrotal hernia: Follow up with PMD for referral to surgery in as outpt.  12. Morbid obesity: have allowed a regular diet  -discussed dietary choices, avoidance of excessive carbs  -he says [place] and fast food has been what's easy/available---understands that he needs to eat better  13. Right bicondylar tibial plateau fracture s/p fasciotomy RLE and ORIF  WBAT.  14. H/o HIV: Followed by Dr. Ninetta Lights.  15. Prediabetes: Hgb A1c-6.0. Dietician educate patient on appropriate diet.  16. AKI: Resolved   CR 0.99 on 2/20  17. Lip ulcer  Vaseline ordered. 18. HTN  Will increase lisinopril to 10.  LOS (Days) 7 A FACE TO FACE EVALUATION WAS PERFORMED  Todd Parrish Todd Parrish 02/13/2016 9:17 AM

## 2016-02-14 ENCOUNTER — Inpatient Hospital Stay (HOSPITAL_COMMUNITY): Payer: Medicaid Other

## 2016-02-14 ENCOUNTER — Inpatient Hospital Stay (HOSPITAL_COMMUNITY): Payer: Self-pay | Admitting: Physical Therapy

## 2016-02-14 NOTE — Progress Notes (Signed)
Physical Therapy Session Note  Patient Details  Name: Todd Parrish MRN: [id number] Date of Birth: [birth year]  Today's Date: 02/14/2016 PT Individual Time: 1000-1103 PT Individual Time Calculation (min): 63 min   Short Term Goals: Week 2:  PT Short Term Goal 1 (Week 2): STG = LTG (Goals not met 2/2 pt experiencing high BP during first week, as well as poor endurance & activity tolerance during sessions)  Skilled Therapeutic Interventions/Progress Updates:    Pt received at end of OT sesssion & agreeable to PT. Pt denied c/o pain, reporting only stiffness in RLE.  RLE circumference measured to be 20 1/4 inches.  Kinesiotape to RLE removed & extremity cleaned with alcohol pad to improve adherence of new tape.  New kinesiotape applied to anterior aspect right lower leg to facilitate increased circulation & edema reduction.  Kinesiotape applied in a criss-cross fashion with 4 different sections.  Pt then performed stand pivot transfer w/c>nu-step with supervision assistance; PT providing moderate verbal cuing for hand placement & sequencing.  Pt utilized nu-step, level 1 for 6 minutes + level 3 for 3 minutes + level 1 for 2 minutes for overall endurance training, RLE strengthening and to facilitate edema reduction in RLE.  Pt transferred back to w/c in same manner as noted above.  HR = 85 bpm, SpO2 = 96%, BP = 136/65 mmHg.  Pt transported back to room via w/c Total A & left with RLE elevated & all needs met.   Therapy Documentation Precautions:  Precautions Weight Bearing Restrictions: Yes RLE Weight Bearing: Weight bearing as tolerated  Vital Signs: Therapy Vitals Pulse Rate: 85 BP: 136/65 mmHg (after nu-step activity) Patient Position (if appropriate): Sitting Oxygen Therapy SpO2: 96 % O2 Device: Not Delivered Pain: Pain Assessment Pain Assessment: No/denies pain   See Function Navigator for Current Functional Status.   Therapy/Group: Individual Therapy  Waunita Schooner, PT, DPT 02/14/2016, 11:19 AM

## 2016-02-14 NOTE — Progress Notes (Signed)
Physical Therapy Session Note  Patient Details  Name: Todd Parrish MRN: [id number] Date of Birth: 08-13-1981  Today's Date: 02/14/2016 PT Individual Time: 0801-0831 PT Individual Time Calculation (min): 30 min   Short Term Goals: Week 1:  PT Short Term Goal 1 (Week 1): STG = LTG (Goals not met 2/2 pt experiencing high BP during first week, as well as poor endurance & activity tolerance during sessions.)  Skilled Therapeutic Interventions/Progress Updates:    Patient received sitting EOB with bedside table for breakfast. Patient agreeable to PT. Patient performed gait training in room to bathroom ~19f with RW, supervision assist from PT. [facility]. Patient performed stair training with 2 rails for 4 steps x 2. First set with step to gait pattern and supervision assist; second set with step over step gait pattern for ascent with Min A and step to gait pattern with descent with supervision A. Gait training performed with LBQC x 35 ft with MinA from PT for improved weight shift and increased safety, PT also provided cues for proper AD management and increased weight shift over the R LE. [facility] and [facility] bell within reach.   Therapy Documentation Precautions:  Precautions Precautions: Other (comment) (PWB RLE) Restrictions Weight Bearing Restrictions: Yes RLE Weight Bearing: Weight bearing as tolerated  General:   Pain: Pain Assessment Pain Assessment: 0-10 Pain Score: 1  Pain Type: Acute pain Pain Location: Foot Pain Orientation: Right Pain Descriptors / Indicators: Sore Patients Stated Pain Goal: 0 Pain Intervention(s): Ambulation/increased activity Multiple Pain Sites: No   See Function Navigator for Current Functional Status.   Therapy/Group: Individual Therapy  ALorie Phenix2/24/2017, 8:36 AM

## 2016-02-14 NOTE — Progress Notes (Signed)
[place] PHYSICAL MEDICINE & REHABILITATION     PROGRESS NOTE  Subjective/Complaints:  Pt seen laying in bed this AM.  He slept well and feels good today.  ROS: Denies CP, SOB, N/V/D.   Objective: Vital Signs: Blood pressure 112/66, pulse 98, temperature 98.2 F (36.8 C), temperature source Oral, resp. rate 17, height  (1.702 m), weight 153.4 kg (338 lb 3 oz), SpO2 98 %. No results found. No results for input(s): WBC, HGB, HCT, PLT in the last 72 hours. No results for input(s): NA, K, CL, GLUCOSE, BUN, CREATININE, CALCIUM in the last 72 hours.  Invalid input(s): CO CBG (last 3)  No results for input(s): GLUCAP in the last 72 hours.  Wt Readings from Last 3 Encounters:  02/12/16 153.4 kg (338 lb 3 oz)  02/01/16 155 kg (341 lb 11.4 oz)  01/14/16 157.852 kg (348 lb)    Physical Exam:  BP 112/66 mmHg  Pulse 98  Temp(Src) 98.2 F (36.8 C) (Oral)  Resp 17  Ht  (1.702 m)  Wt 153.4 kg (338 lb 3 oz)  BMI 52.95 kg/m2  SpO2 98% Constitutional: He appears well-developed and well-nourished. No distress.  Vital signs reviewed.  HENT: Normocephalic and atraumatic.  Eyes: Conjunctivae and EOM are normal.   Cardiovascular: Normal rate and regular rhythm.No murmur heard. Respiratory: Effort normal and breath sounds normal. No respiratory distress.  GI: Soft. Bowel sounds are normal. He exhibits no distension. There is no tenderness.  Musculoskeletal: He exhibits edema (BLE). Kineseotape in place. Well healed incision right shin. Neurological: He is alert and oriented.  Speech clear.  Follows commands without difficulty.  Motor: B/l UE 5/5 proximal to distal LLE: 4+/5 proximal to distal RLE: 4+/5 hip flexion, 4+/5 knee extension, 5/5 ankle dorsi/plantar flexion Skin: Skin is warm and dry. Right lateral lip ulcer, healing.  Psychiatric: He has a normal mood and affect. His behavior is normal. Thought content normal.   Assessment/Plan: 1. Functional deficits secondary  to submassive PE and morbid obesity which require 3+ hours per day of interdisciplinary therapy in a comprehensive inpatient rehab setting. Physiatrist is providing close team supervision and 24 hour management of active medical problems listed below. Physiatrist and rehab team continue to assess barriers to discharge/monitor patient progress toward functional and medical goals.  Function:  Bathing Bathing position   Position: Shower  Bathing parts Body parts bathed by patient: Right arm, Left arm, Chest, Abdomen, Front perineal area, Right upper leg, Buttocks, Left upper leg, Right lower leg, Left lower leg Body parts bathed by helper: Buttocks, Back  Bathing assist Assist Level: Set up   Set up : To obtain items  Upper Body Dressing/Undressing Upper body dressing   What is the patient wearing?: Pull over shirt/dress     Pull over shirt/dress - Perfomed by patient: Thread/unthread right sleeve, Thread/unthread left sleeve, Put head through opening, Pull shirt over trunk          Upper body assist Assist Level: Set up   Set up : To obtain clothing/put away  Lower Body Dressing/Undressing Lower body dressing   What is the patient wearing?: Underwear, Pants, Non-skid slipper socks Underwear - Performed by patient: Thread/unthread left underwear leg, Pull underwear up/down Underwear - Performed by helper: Thread/unthread right underwear leg Pants- Performed by patient: Thread/unthread left pants leg, Pull pants up/down, Fasten/unfasten pants Pants- Performed by helper: Thread/unthread right pants leg Non-skid slipper socks- Performed by patient: Don/doff right sock, Don/doff left sock Non-skid slipper socks- Performed  by helper: Don/doff right sock, Don/doff left sock                  Lower body assist Assist for lower body dressing: Touching or steadying assistance (Pt > 75%)      Toileting Toileting Toileting activity did not occur: Safety/medical concerns Toileting  steps completed by patient: Adjust clothing prior to toileting, Performs perineal hygiene, Adjust clothing after toileting Toileting steps completed by helper: Adjust clothing prior to toileting    Toileting assist Assist level: Supervision or verbal cues   Transfers Chair/bed transfer   Chair/bed transfer method: Ambulatory Chair/bed transfer assist level: Supervision or verbal cues Chair/bed transfer assistive device: Patent attorney Ambulation activity did not occur:  (bariatric)   Max distance: 35 ft Assist level: Touching or steadying assistance (Pt > 75%)   Wheelchair Wheelchair activity did not occur: Safety/medical concerns (c/o sternal pain when attempting to propel w/c with BUE) Type: Manual Max wheelchair distance: 75 ft Assist Level: Supervision or verbal cues  Cognition Comprehension Comprehension assist level: Follows basic conversation/direction with no assist  Expression Expression assist level: Expresses complex ideas: With no assist  Social Interaction Social Interaction assist level: Interacts appropriately 90% of the time - Needs monitoring or encouragement for participation or interaction.  Problem Solving Problem solving assist level: Solves basic problems with no assist  Memory Memory assist level: More than reasonable amount of time    Medical Problem List and Plan: 1. Debility secondary to submassive PE and morbid obesity.   Continue CIR 2. Submassive PE/DVT Prophylaxis/Anticoagulation: Mechanical: Sequential compression devices, below knee Bilateral lower extremities. Has IVC filter in place and to follow up with IVR in 8-12 weeks. Follow up with Pulmonary in 3 months. No chemical anticoagulation due to retroperitoneal bleed. 3. Pain Management: Ultram prn for pain.  4. Mood: LCSW to follow for evaluation and support.  5. Neuropsych: This patient is capable of making decisions on his own behalf. Appreciate neuropsych eval, patient  doing relatively well. 6. Skin/Wound Care: routine pressure relief measures.  7. Fluids/Electrolytes/Nutrition: Monitor I/Os.  8. Aspiration PNA:   Doxycyline completed 9. Retroperitoneal bleed with ABLA: H/H stable after transfusion with 2 units.  Added iron for supplement.              -Hb 9.5 on 2/20 10. Cardiac arrest/PEA: Due to submassive PE. (see #2) 11. Large scrotal hernia: Follow up with PMD for referral to surgery in as outpt.  12. Morbid obesity: have allowed a regular diet  -discussed dietary choices, avoidance of excessive carbs  -he says that [place] road and fast food has been what's easy/available---understands that he needs to eat better  13. Right bicondylar tibial plateau fracture s/p fasciotomy RLE and ORIF  WBAT.  14. H/o HIV: Followed by Dr. Ninetta Lights.  15. Prediabetes: Hgb A1c-6.0. Dietician educate patient on appropriate diet.  16. AKI: Resolved   CR 0.99 on 2/20  Will cont to monitor 17. Lip ulcer  Vaseline ordered. 18. HTN  Improved on lisinopril to 10.  LOS (Days) 8 A FACE TO FACE EVALUATION WAS PERFORMED  Todd Parrish 02/14/2016 9:47 AM

## 2016-02-14 NOTE — Progress Notes (Signed)
Occupational Therapy Weekly Progress Note  Patient Details  Name: Todd Parrish MRN: [id number] Date of Birth: November 06, 1981  Beginning of progress report period: February 07, 2016 End of progress report period: February 14, 2016  Today's Date: 02/14/2016 OT Individual Time: 0845-1000 OT Individual Time Calculation (min): 75 min    Patient has met 5 of 5 short term goals.  Pt is making good progress with all goals but requires extra time and intermittent motivational interviewing to persevere through therapy schedule d/t fatige and min-mod RLE discomfort.  Patient continues to demonstrate the following deficits: Impaired endurance, RLE pain, impaired mobility, and impaired performance of self-care skills and therefore will continue to benefit from skilled OT intervention to enhance overall performance with BADL.  Patient progressing toward long term goals..  Continue plan of care.  OT Short Term Goals Week 1:  OT Short Term Goal 1 (Week 1): Pt will complete tub/shower transfer using bath bench and LRAD with min assist OT Short Term Goal 1 - Progress (Week 1): Met OT Short Term Goal 2 (Week 1): Pt will complete simple meal prep using RW with supervision OT Short Term Goal 2 - Progress (Week 1): Met OT Short Term Goal 3 (Week 1): Pt will complete 2 grooming tasks standing at sink for 3 minutes OT Short Term Goal 3 - Progress (Week 1): Met OT Short Term Goal 4 (Week 1): Pt will bathe using AE with supervision OT Short Term Goal 4 - Progress (Week 1): Met OT Short Term Goal 5 (Week 1): Pt will complete lower body dressing using AE, prn, with min assist OT Short Term Goal 5 - Progress (Week 1): Met Week 2:  OT Short Term Goal 1 (Week 2): STG=LTG d/t short remaining LOS  Skilled Therapeutic Interventions/Progress Updates: Therapeutic activities with focus on improved standing tolerance, dynamic standing, discharge planning relating to performance of iADL, and homemaking skills  (laundry, meal prep).    With extra time to orient to situation and purpose of tasks, pt ambulates to laundry room, loads wash, and rests prior to escort to kitchen in w/c for meal prep task.   Pt was oriented to kitchen task but requests repeat of directions and intent of task in kitchen prior to initiating task.  Pt prepares pudding using cold ingredients, accessing refrigerator, cabinets and drawers using RW and counter tops for stability.   Pt confides that he uses an office chair in his kitchen and has previously organized supplies and utensils to be within easy reach.   Pt also relates that family and friends have offered to bring meals to him prepared during his recovery and he will not reunite with his sons until after they have completed their school year at pt's mother's residence (in [place]).   Pt states he is only concerned over time alone at home but feels confident he will manage homemaking with some assist from friends who have already offered help.     Therapy Documentation Precautions:  Precautions Precautions: Other (comment) (PWB RLE) Restrictions Weight Bearing Restrictions: Yes RLE Weight Bearing: Weight bearing as tolerated RLE Partial Weight Bearing Percentage or Pounds: 50%  Vital Signs: Therapy Vitals Temp: 98.1 F (36.7 C) Temp Source: Oral Pulse Rate: 84 Resp: 17 BP: (!) 150/90 mmHg Patient Position (if appropriate): Sitting Oxygen Therapy O2 Device: Not Delivered   Pain: Pain Assessment Pain Assessment: 0-10 Pain Score: 2  Pain Type: Acute pain Pain Location: Foot Pain Orientation: Right Pain Descriptors / Indicators: Sore Patients Stated Pain  Goal: 2 Pain Intervention(s): Medication (See eMAR) Multiple Pain Sites: No  ADL: ADL ADL Comments: see Functional Assessment Tool  See Function Navigator for Current Functional Status.   Therapy/Group: Individual Therapy   Second session: Time: 1330-1340 Time Calculation (min):  40 min  Pain Assessment:  No/denies pain  Skilled Therapeutic Interventions: Therapeutic activity with focus on improved functional mobility using RW, dynamic standing balance, and endurance.   Pt challenged to ambulate from his room to laundry to retrieve his laundry using RW, followed by return to concession area in [place], gift shop and back to room.   Pt able to ambulate to laundry as directed with distant supervision.   Pt recovered laundry and returned to w/c for escort to concession area to stand in line and place order for evening meal.   Pt remained standing for 12 minutes, completed transaction and progressed through line w/o complaint.   Pt was escorted back to his room and requested access to bathroom, ambulating independently with use of RW to stand to urinate.   Pt reports only intermittent discomfort at RLE during session and requires no assist to stand or sit but needs cues to attend to foot rests and lock brakes on w/c before standing.   See FIM for current functional status  Therapy/Group: Individual Therapy  Todd Parrish 02/14/2016, 3:22 PM

## 2016-02-15 ENCOUNTER — Inpatient Hospital Stay (HOSPITAL_COMMUNITY): Payer: Self-pay | Admitting: Physical Therapy
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
## 2016-02-16 ENCOUNTER — Inpatient Hospital Stay (HOSPITAL_COMMUNITY): Payer: Medicaid Other
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
## 2016-02-16 NOTE — Progress Notes (Signed)
Occupational Therapy Session Note  Patient Details  Name: Todd Parrish MRN: [id number] Date of Birth: 1980/12/23  Today's Date: 02/16/2016 OT Individual Time: 0830-0930 OT Individual Time Calculation (min): 60 min    Short Term Goals: Week 2:  OT Short Term Goal 1 (Week 2): STG=LTG d/t short remaining LOS  Skilled Therapeutic Interventions/Progress Updates: Therapeutic activity with focus on improved mobility, dynamic standing balance, and general conditioning.   Pt completes bed mobility using bed rail and HOB elevated to rise to sit at EOB.   Pt ambulates to w/c and is escorted to rehab gym to perform 10 min of general U/LB strengtheing using NuStep.   Pt returns to his room for re-ed on discharge recommendations to include re-assessment of use of DME and AE necessary for performance of BADL and iADL.   Pt reiterates plan for some assistance with iADL from friends and family.      Therapy Documentation Precautions:  Precautions Precautions: Other (comment) (PWB RLE) Restrictions Weight Bearing Restrictions: Yes RLE Weight Bearing: Weight bearing as tolerated RLE Partial Weight Bearing Percentage or Pounds: 50%  Pain: Pain Assessment Pain Assessment: No/denies pain   ADL: ADL ADL Comments: see Functional Assessment Tool   Exercises: Cardiovascular Exercises NuStep: 10 min, level 1, 503 steps completed   See Function Navigator for Current Functional Status.   Therapy/Group: Individual Therapy  Todd Parrish 02/16/2016, 11:45 AM

## 2016-02-17 ENCOUNTER — Inpatient Hospital Stay (HOSPITAL_COMMUNITY): Payer: Self-pay | Admitting: Physical Therapy

## 2016-02-17 ENCOUNTER — Inpatient Hospital Stay (HOSPITAL_COMMUNITY): Payer: Medicaid Other

## 2016-02-17 ENCOUNTER — Ambulatory Visit: Payer: Self-pay | Admitting: Infectious Diseases

## 2016-02-17 NOTE — Progress Notes (Signed)
[facility] PHYSICAL MEDICINE & REHABILITATION     PROGRESS NOTE  Subjective/Complaints:  Patient seen sitting in bed this morning. He easily arousable and states that he had a good weekend. He also notes that his lip is improving.  ROS: Denies CP, SOB, N/V/D.   Objective: Vital Signs: Blood pressure 129/80, pulse 91, temperature 98.5 F (36.9 C), temperature source Oral, resp. rate 19, height  (1.702 m), weight 153.4 kg (338 lb 3 oz), SpO2 96 %. No results found. No results for input(s): WBC, HGB, HCT, PLT in the last 72 hours. No results for input(s): NA, K, CL, GLUCOSE, BUN, CREATININE, CALCIUM in the last 72 hours.  Invalid input(s): CO CBG (last 3)  No results for input(s): GLUCAP in the last 72 hours.  Wt Readings from Last 3 Encounters:  02/12/16 153.4 kg (338 lb 3 oz)  02/01/16 155 kg (341 lb 11.4 oz)  01/14/16 157.852 kg (348 lb)    Physical Exam:  BP 129/80 mmHg  Pulse 91  Temp(Src) 98.5 F (36.9 C) (Oral)  Resp 19  Ht  (1.702 m)  Wt 153.4 kg (338 lb 3 oz)  BMI 52.95 kg/m2  SpO2 96% Constitutional: Obese. No distress.  Vital signs reviewed.  HENT: Normocephalic and atraumatic.  Eyes: Conjunctivae and EOM are normal.   Cardiovascular: Normal rate and regular rhythm.No murmur heard. Respiratory: Effort normal and breath sounds normal. No respiratory distress.  GI: Soft. Bowel sounds are normal. He exhibits no distension. There is no tenderness.  Musculoskeletal: He exhibits edema (BLE). Kineseotape in place. Well healed incision right shin. Neurological: He is alert and oriented.  Speech clear.  Follows commands without difficulty.  Motor: B/l UE 5/5 proximal to distal LLE: 4+/5 proximal to distal RLE: 4+/5 hip flexion, 4+/5 knee extension, 5/5 ankle dorsi/plantar flexion Skin: Skin is warm and dry. Right lateral lip ulcer, healing.  Psychiatric: He has a normal mood and affect. His behavior is normal. Thought content normal.    Assessment/Plan: 1. Functional deficits secondary to submassive PE and morbid obesity which require 3+ hours per day of interdisciplinary therapy in a comprehensive inpatient rehab setting. Physiatrist is providing close team supervision and 24 hour management of active medical problems listed below. Physiatrist and rehab team continue to assess barriers to discharge/monitor patient progress toward functional and medical goals.  Function:  Bathing Bathing position   Position: Shower  Bathing parts Body parts bathed by patient: Right arm, Left arm, Chest, Abdomen, Front perineal area, Right upper leg, Buttocks, Left upper leg, Right lower leg, Left lower leg Body parts bathed by helper: Buttocks, Back  Bathing assist Assist Level: Set up   Set up : To obtain items  Upper Body Dressing/Undressing Upper body dressing   What is the patient wearing?: Pull over shirt/dress     Pull over shirt/dress - Perfomed by patient: Thread/unthread right sleeve, Thread/unthread left sleeve, Put head through opening, Pull shirt over trunk          Upper body assist Assist Level: Set up   Set up : To obtain clothing/put away  Lower Body Dressing/Undressing Lower body dressing   What is the patient wearing?: Underwear, Pants, Non-skid slipper socks Underwear - Performed by patient: Thread/unthread left underwear leg, Pull underwear up/down Underwear - Performed by helper: Thread/unthread right underwear leg Pants- Performed by patient: Thread/unthread left pants leg, Pull pants up/down, Fasten/unfasten pants Pants- Performed by helper: Thread/unthread right pants leg Non-skid slipper socks- Performed by patient: Don/doff right sock,  Don/doff left sock Non-skid slipper socks- Performed by helper: Don/doff right sock, Don/doff left sock                  Lower body assist Assist for lower body dressing: Touching or steadying assistance (Pt > 75%)      Toileting Toileting Toileting  activity did not occur: Safety/medical concerns Toileting steps completed by patient: Adjust clothing prior to toileting, Performs perineal hygiene, Adjust clothing after toileting Toileting steps completed by helper: Adjust clothing prior to toileting    Toileting assist Assist level: Supervision or verbal cues   Transfers Chair/bed transfer   Chair/bed transfer method: Ambulatory Chair/bed transfer assist level: Supervision or verbal cues Chair/bed transfer assistive device: Patent attorney Ambulation activity did not occur:  (bariatric)   Max distance: 35 ft Assist level: Touching or steadying assistance (Pt > 75%)   Wheelchair Wheelchair activity did not occur: Safety/medical concerns (c/o sternal pain when attempting to propel w/c with BUE) Type: Manual Max wheelchair distance: 75 ft Assist Level: Supervision or verbal cues  Cognition Comprehension Comprehension assist level: Follows complex conversation/direction with no assist  Expression Expression assist level: Expresses complex ideas: With no assist  Social Interaction Social Interaction assist level: Interacts appropriately 90% of the time - Needs monitoring or encouragement for participation or interaction.  Problem Solving Problem solving assist level: Solves basic problems with no assist  Memory Memory assist level: More than reasonable amount of time    Medical Problem List and Plan: 1. Debility secondary to submassive PE and morbid obesity.   Continue CIR, plan DC tomorrow 2. Submassive PE/DVT Prophylaxis/Anticoagulation: Mechanical: Sequential compression devices, below knee Bilateral lower extremities. Has IVC filter in place and to follow up with IVR in 8-12 weeks. Follow up with Pulmonary in 3 months. No chemical anticoagulation due to retroperitoneal bleed. 3. Pain Management: Ultram prn for pain.  4. Mood: LCSW to follow for evaluation and support.  5. Neuropsych: This patient is  capable of making decisions on his own behalf. Appreciate neuropsych eval, patient doing relatively well. 6. Skin/Wound Care: routine pressure relief measures.  7. Fluids/Electrolytes/Nutrition: Monitor I/Os.  8. Aspiration PNA:   Doxycyline completed 9. Retroperitoneal bleed with ABLA: H/H stable after transfusion with 2 units.  Added iron for supplement.              -Hb 9.5 on 2/20 10. Cardiac arrest/PEA: Due to submassive PE. (see #2) 11. Large scrotal hernia: Follow up with PMD for referral to surgery in as outpt.  12. Morbid obesity: have allowed a regular diet  -discussed dietary choices, avoidance of excessive carbs  -[place] and fast food has been what's easy/available---understands that he needs to eat better  13. Right bicondylar tibial plateau fracture s/p fasciotomy RLE and ORIF  WBAT.  14. H/o HIV: Followed by Dr. Ninetta Lights.  15. Prediabetes: Hgb A1c-6.0. Dietician educate patient on appropriate diet.  16. AKI: Resolved   CR 0.99 on 2/20 17. Lip ulcer  Vaseline ordered. 18. HTN  Improved on lisinopril 10.  LOS (Days) 11 A FACE TO FACE EVALUATION WAS PERFORMED  Ankit Karis Juba 02/17/2016 9:09 AM

## 2016-02-17 NOTE — Progress Notes (Signed)
Social Work Patient ID: Todd Parrish, male   DOB: [birth year], 35 y.o.   MRN: [id number] Met with pt to discuss discharge tomorrow, pt wanted to make sure he was going tomorrow. Confirmed he was unless something medical came up. He is somewhat apprehensive due to last discharge from NH he had a medical event and was right back to the hospital. Discussed it would be good to have someone Stay with him tomorrow to make sure the transition is smooth home and he is moving around well like he does here. He has all of his equipment to take home with him and He is aware he does not qualify for follow up therapies, team has given him a home exercise program. He feels he will do well at home and feels prepared for this. Have given him the follow up appointment for [facility] 3/3 @ 2:00 pm will give him the Match paperwork tomorrow prior to leaving. See in am to make sure all questions answered and addressed.

## 2016-02-17 NOTE — Progress Notes (Signed)
Occupational Therapy Discharge Summary  Patient Details  Name: Todd Parrish MRN: [id number] Date of Birth: 1981/04/29   Patient has met 10 of 12 long term goals due to improved activity tolerance, ability to compensate for deficits and improved awareness.  Patient to discharge at overall Modified Independent level.  Patient declares that his care partners are independent to provide the necessary assistance at discharge.    Reasons goals not met: Denied need for skill development with home making and lower body dressing (socks/shoes)  Recommendation:  No further OT services required to advance in performance of BADL or iADL.  Equipment: Tub bench  Reasons for discharge: lack of progress toward goals  Patient/family agrees with progress made and goals achieved: Yes  OT Discharge Precautions/Restrictions  Restrictions Weight Bearing Restrictions: Yes RLE Weight Bearing: Weight bearing as tolerated  Vital Signs Therapy Vitals Pulse Rate: (!) 102 BP: 134/77 mmHg (after ambulating) Patient Position (if appropriate): Sitting (after ambulating)  Pain Pain Assessment Pain Assessment: No/denies pain  ADL ADL ADL Comments: see Functional Assessment Tool  Vision/Perception  Vision- History Baseline Vision/History: No visual deficits   Cognition Overall Cognitive Status: Within Functional Limits for tasks assessed Arousal/Alertness: Awake/alert Orientation Level: Oriented X4 Attention: Divided Divided Attention: Appears intact Memory: Appears intact Awareness: Appears intact Problem Solving: Appears intact Safety/Judgment: Appears intact  Sensation Sensation Light Touch: Impaired by gross assessment (lateral aspect of distal RLE impaired sensation to light touch) Coordination Gross Motor Movements are Fluid and Coordinated: Yes  Motor  Motor Motor: Within Functional Limits  Mobility  Bed Mobility Bed Mobility: Rolling Right;Rolling Left;Sit to  Supine;Supine to Sit Rolling Right: 6: Modified independent (Device/Increase time) Rolling Left: 6: Modified independent (Device/Increase time) Right Sidelying to Sit: 6: Modified independent (Device/Increase time) Supine to Sit: 6: Modified independent (Device/Increase time) Sitting - Scoot to Edge of Bed: 6: Modified independent (Device/Increase time) Sit to Supine: 6: Modified independent (Device/Increase time) Sit to Sidelying Right: 6: Modified independent (Device/Increase time) Transfers Sit to Stand: 6: Modified independent (Device/Increase time) Stand to Sit: 6: Modified independent (Device/Increase time)   Trunk/Postural Assessment  Cervical Assessment Cervical Assessment: Within Functional Limits Thoracic Assessment Thoracic Assessment: Within Functional Limits Lumbar Assessment Lumbar Assessment: Within Functional Limits Postural Control Postural Control: Within Functional Limits   Balance Static Sitting Balance Static Sitting - Balance Support: Feet supported;No upper extremity supported Static Sitting - Level of Assistance: 7: Independent Dynamic Sitting Balance Dynamic Sitting - Balance Support: Bilateral upper extremity supported Dynamic Sitting - Level of Assistance: 7: Independent Static Standing Balance Static Standing - Balance Support: Bilateral upper extremity supported Static Standing - Level of Assistance: 6: Modified independent (Device/Increase time) Dynamic Standing Balance Dynamic Standing - Balance Support: Left upper extremity supported Dynamic Standing - Level of Assistance: 6: Modified independent (Device/Increase time) Dynamic Standing - Balance Activities: Reaching for objects;Reaching across midline;Reaching for weighted objects;Forward lean/weight shifting  Extremity/Trunk Assessment RUE Assessment RUE Assessment: Within Functional Limits LUE Assessment LUE Assessment: Within Functional Limits   See Function Navigator for Current  Functional Status.  Salome Spotted 02/17/2016, 2:58 PM

## 2016-02-17 NOTE — Progress Notes (Signed)
Physical Therapy Discharge Summary  Patient Details  Name: Todd Parrish MRN: [id number] Date of Birth: 1981/01/15  Today's Date: 02/17/2016 PT Individual Time: 0930-1100  And 1300-1401 PT Individual Time Calculation (min): 90 min + 61 minutes   Patient has met 6 of 6 long term goals due to improved activity tolerance, improved balance, increased strength, increased range of motion and decreased pain.  Patient to discharge at an ambulatory level Modified Independent.   Patient is discharging at a Mod I level and does not require physical assistance for functional mobility upon d/c.   Reasons goals not met: Pt met 6 out of 6 goals.  Recommendation:  Patient may not be eligible for ongoing skilled PT services 2/2 insurance.  If pt is able to receive ongoing PT pt would benefit from services to address ongoing impairments in poor endurance, gait, balance and to minimize fall risk.  Pt provided with HEP prior to d/c from this facility.   Equipment: Pt received bariatric RW prior to d/c.   Reasons for discharge: treatment goals met  Patient/family agrees with progress made and goals achieved: Yes   Skilled Intervention: Treatment 1: Patient received in w/c & agreeable to PT.  Pt denied c/o pain. Pt ambulated 28 ft with bariatric RW, progressing to from step-to gait pattern to reciprocal gait, but still with decreased step length LLE.  After seated rest break pt ambulated ~130 ft to ortho gym & performed car transfer with RW & Mod I.  Pt then propelled w/c with BUE & Mod I for 80 ft. Pt performed endurance task & put pillow cases on pillows in rehab apartment. Pt then performed bed mobility (rolling L & R, supine<>sit) from traditional bed with Mod I.  Pt also able to perform transfers on/off apartment couch with mod I. PT educated pt to pull walker completely back towards sitting surface as he backs up to chair/bed to ensure RW is in proper position to provide him support.  Pt then  ambulated to rehab gym & negotiated 4 steps (6 inches) + 12 steps (6 inches + 3 inch step height) with bilateral railings and Mod I.  PT educated pt to continue step-to pattern, instead of step-over-step, while negotiating stairs.  Pt was able to negotiate over uneven surface ~10 ft with RW & supervision.  Pt reported need to use restroom & ambulated 180 ft back to room & performed toileting tasks independently.  Pt ambulated to patient laundry room & transferred clothing from washer>dryer; PT cued pt for standing position to decrease fall risk with task.  At the end of the session pt was left in w/c with RLE elevated & all needs within reach.  During today's session pt's RLE circumference = 20 inches.   Treatment 2: Pt received in w/c & agreeable to PT; pt denied pain. Pt ambulated 160 ft with bariatric RW to rehab gym. Pt utilized nu-step for endurance training; Level 1 x 3 minutes + Level 3 x 10 minutes + Level 1 x 2 minutes.  Pt then ambulated 160 ft back to room; PT encouraged pt to continue increasing LLE step length & shorten RLE step length to allow pt to continue progressing to reciprocal gait pattern.  PT gave pt BLE there ex HEP consisting of supine hip abduction, standing heel raises, seated long arc quads, and ankle dorsiflexion/platarflexion.  Pt demonstrated long arc quads 2 sets of 10 reps with 5 second hold, and ankle dorsiflexion/plantarflexion.  Pt attempted to perform standing heel raises  but reported pain in RLE.  PT instructed pt to attempt activity and incorporate exercise into HEP as he tolerates it; pt reported understanding of HEP. Pt then ambulated 100 ft with Mod I & at end of session pt left in w/c with all needs met/within reach.   PT Discharge Precautions/Restrictions Restrictions Weight Bearing Restrictions: Yes RLE Weight Bearing: Weight bearing as tolerated Vital Signs Therapy Vitals Pulse Rate: (!) 102 BP: 134/77 mmHg (after ambulating) Patient Position (if  appropriate): Sitting (after ambulating) Pain Pain Assessment Pain Assessment: No/denies pain  Cognition Overall Cognitive Status: Within Functional Limits for tasks assessed Sensation Sensation Light Touch: Impaired by gross assessment (lateral aspect of distal RLE impaired sensation to light touch) Coordination Gross Motor Movements are Fluid and Coordinated: Yes Motor  Motor Motor: Within Functional Limits  Mobility Bed Mobility Bed Mobility: Rolling Right;Rolling Left;Sit to Supine;Supine to Sit Rolling Right: 6: Modified independent (Device/Increase time) Rolling Left: 6: Modified independent (Device/Increase time) Right Sidelying to Sit: 6: Modified independent (Device/Increase time) Supine to Sit: 6: Modified independent (Device/Increase time) Sitting - Scoot to Edge of Bed: 6: Modified independent (Device/Increase time) Sit to Supine: 6: Modified independent (Device/Increase time) Sit to Sidelying Right: 6: Modified independent (Device/Increase time) Transfers Transfers: Yes Sit to Stand: 6: Modified independent (Device/Increase time) Stand to Sit: 6: Modified independent (Device/Increase time) Locomotion  Ambulation Ambulation: Yes Ambulation Distance (Feet): 180 Feet Assistive device: Rolling walker (bariatric) Gait Gait: Yes Gait Pattern: Decreased step length - left Gait velocity: decreased gait speed Stairs / Additional Locomotion Stairs: Yes Stairs Assistance: 6: Modified independent (Device/Increase time) Stair Management Technique: Two rails Number of Stairs: 12 Height of Stairs:  (6 inches + 3 inches) Wheelchair Mobility Wheelchair Mobility: Yes Wheelchair Assistance: 6: Modified independent (Device/Increase time) Environmental health practitioner: Both upper extremities Wheelchair Parts Management: Independent Distance:  (80 ft)   Balance Static Sitting Balance Static Sitting - Balance Support: Feet supported;No upper extremity supported Static Sitting -  Level of Assistance: 7: Independent Extremity Assessment      RLE Assessment RLE Assessment:  (3+/5 anke dorsiflexion, 4/5 knee extension, all others 5/5) LLE Assessment LLE Assessment:  (hip flexion, knee flexion & extension, ankle dorsiflexion all 5/5)   See Function Navigator for Current Functional Status.  Waunita Schooner 02/17/2016, 12:47 PM

## 2016-02-17 NOTE — Progress Notes (Signed)
Occupational Therapy Session Note  Patient Details  Name: Todd Parrish MRN: [id number] Date of Birth: 1981/11/11  Today's Date: 02/17/2016 OT Individual Time: 0830-0930 OT Individual Time Calculation (min): 60 min    Short Term Goals: Week 2:  OT Short Term Goal 1 (Week 2): STG=LTG d/t short remaining LOS  Skilled Therapeutic Interventions/Progress Updates: ADL-retraining with focus on review of discharge plans, performance of self-care, functional mobility, pain management and endurance.   Pt received at bedside with breakfast tray, self-feeding.   During self-feeding pt reviewed discharge recommendations with therapist and he continues to endorse discharge on 02/18/16 with plan for intermittent assistance at home through multiple associations (friends, family, church).   Pt endorses need for tub bench for bathing but elected not to perform tub/shower bath during final session d/t discomfort with room temperature at ADL apartment.   Pt completed bathing and dressing at sink, donning underwear and pants with reacher provided.   Pt is able to gather his own clothing but requires setup at CIR d/t time limitations.   Pt donned shoes during session with mod assist and states that he will not wear shoes at home but will simply ask for assist when going to his church, who have offered transportation for him.   Pt is therefore discharged at overall mod I level with use of intermittent physical assist for iADL and more formal dressing occassions.   Pt completes all transfers unassisted using RW and performs toilet hygiene unassisted.     Therapy Documentation Precautions:  Precautions Precautions: Other (comment) (PWB RLE) Restrictions Weight Bearing Restrictions: Yes RLE Weight Bearing: Weight bearing as tolerated RLE Partial Weight Bearing Percentage or Pounds: 50%   Pain: No/denies pain     ADL: ADL ADL Comments: see Functional Assessment Tool   See Function Navigator for Current  Functional Status.   Therapy/Group: Individual Therapy  Takeyla Million 02/17/2016, 9:44 AM

## 2016-02-18 DIAGNOSIS — R350 Frequency of micturition: Secondary | ICD-10-CM

## 2016-02-18 LAB — CBC WITH DIFFERENTIAL/PLATELET
Basophils Absolute: 0 10*3/uL (ref 0.0–0.1)
Basophils Relative: 0 %
EOS ABS: 0.1 10*3/uL (ref 0.0–0.7)
EOS PCT: 0 %
HCT: 29.8 % — ABNORMAL LOW (ref 39.0–52.0)
Hemoglobin: 9.2 g/dL — ABNORMAL LOW (ref 13.0–17.0)
LYMPHS ABS: 2.3 10*3/uL (ref 0.7–4.0)
Lymphocytes Relative: 12 %
MCH: 24.1 pg — AB (ref 26.0–34.0)
MCHC: 30.9 g/dL (ref 30.0–36.0)
MCV: 78 fL (ref 78.0–100.0)
Monocytes Absolute: 1.7 10*3/uL — ABNORMAL HIGH (ref 0.1–1.0)
Monocytes Relative: 9 %
Neutro Abs: 15.2 10*3/uL — ABNORMAL HIGH (ref 1.7–7.7)
Neutrophils Relative %: 79 %
PLATELETS: 285 10*3/uL (ref 150–400)
RBC: 3.82 MIL/uL — AB (ref 4.22–5.81)
RDW: 15.8 % — ABNORMAL HIGH (ref 11.5–15.5)
WBC: 19.2 10*3/uL — AB (ref 4.0–10.5)

## 2016-02-18 LAB — URINALYSIS, ROUTINE W REFLEX MICROSCOPIC
BILIRUBIN URINE: NEGATIVE
Glucose, UA: NEGATIVE mg/dL
Ketones, ur: NEGATIVE mg/dL
Nitrite: NEGATIVE
Protein, ur: NEGATIVE mg/dL
Specific Gravity, Urine: 1.011 (ref 1.005–1.030)
pH: 8.5 — ABNORMAL HIGH (ref 5.0–8.0)

## 2016-02-18 LAB — URINE MICROSCOPIC-ADD ON

## 2016-02-18 LAB — BASIC METABOLIC PANEL
ANION GAP: 14 (ref 5–15)
BUN: 8 mg/dL (ref 6–20)
CALCIUM: 8.9 mg/dL (ref 8.9–10.3)
CO2: 21 mmol/L — ABNORMAL LOW (ref 22–32)
Chloride: 99 mmol/L — ABNORMAL LOW (ref 101–111)
Creatinine, Ser: 1.01 mg/dL (ref 0.61–1.24)
GFR calc Af Amer: >60 mL/min (ref >60)
GLUCOSE: 141 mg/dL — AB (ref 65–99)
Potassium: 4 mmol/L (ref 3.5–5.1)
Sodium: 134 mmol/L — ABNORMAL LOW (ref 135–145)

## 2016-02-18 LAB — INFLUENZA PANEL BY PCR (TYPE A & B)
H1N1 flu by pcr: NOT DETECTED
Influenza A By PCR: NEGATIVE
Influenza B By PCR: NEGATIVE

## 2016-02-18 LAB — LACTIC ACID, PLASMA: LACTIC ACID, VENOUS: 1.6 mmol/L (ref 0.5–2.0)

## 2016-02-18 MED ORDER — VANCOMYCIN HCL 10 G IV SOLR
1500.0000 mg | Freq: Three times a day (TID) | INTRAVENOUS | Status: DC
  Administered 2016-02-19 – 2016-02-20 (×4): 1500 mg via INTRAVENOUS
  Filled 2016-02-18 (×7): qty 1500

## 2016-02-18 MED ORDER — CEPHALEXIN 250 MG PO CAPS
500.0000 mg | ORAL_CAPSULE | Freq: Three times a day (TID) | ORAL | Status: DC
  Administered 2016-02-18: 500 mg via ORAL
  Filled 2016-02-18: qty 2

## 2016-02-18 MED ORDER — ACETAMINOPHEN 325 MG PO TABS
650.0000 mg | ORAL_TABLET | ORAL | Status: DC | PRN
Start: 2016-02-18 — End: 2016-02-20

## 2016-02-18 MED ORDER — DEXTROSE 5 % IV SOLN
2.0000 g | Freq: Three times a day (TID) | INTRAVENOUS | Status: DC
  Administered 2016-02-18 – 2016-02-20 (×5): 2 g via INTRAVENOUS
  Filled 2016-02-18 (×8): qty 2

## 2016-02-18 MED ORDER — POLYSACCHARIDE IRON COMPLEX 150 MG PO CAPS: 150.0000 mg | ORAL_CAPSULE | Freq: Two times a day (BID) | ORAL | Status: DC

## 2016-02-18 MED ORDER — PANTOPRAZOLE SODIUM 40 MG PO TBEC: 40.0000 mg | DELAYED_RELEASE_TABLET | Freq: Every day | ORAL | Status: DC

## 2016-02-18 MED ORDER — CEPHALEXIN 500 MG PO CAPS: 500.0000 mg | ORAL_CAPSULE | Freq: Three times a day (TID) | ORAL | Status: DC

## 2016-02-18 MED ORDER — VANCOMYCIN HCL 10 G IV SOLR
2500.0000 mg | Freq: Once | INTRAVENOUS | Status: AC
  Administered 2016-02-18: 2500 mg via INTRAVENOUS
  Filled 2016-02-18: qty 2500

## 2016-02-18 MED ORDER — TRIAMCINOLONE ACETONIDE 0.1 % EX CREA: TOPICAL_CREAM | Freq: Two times a day (BID) | CUTANEOUS | Status: DC

## 2016-02-18 MED ORDER — LISINOPRIL 10 MG PO TABS: 10.0000 mg | ORAL_TABLET | Freq: Every day | ORAL | Status: DC

## 2016-02-18 NOTE — Progress Notes (Addendum)
Social Work  Discharge Note  The overall goal for the admission was met for:   Discharge location: Yes-HOME ALONE WITH INTERMITTENT ASSIST FROM SISTER'S, [place]  Length of Stay: Yes-13 DAYS  Discharge activity level: Yes-MOD/I LEVEL  Home/community participation: Yes  Services provided included: MD, RD, PT, OT, RN, CM, TR, Pharmacy, Neuropsych and SW  Financial Services: Other: PENDING MEDICAID  Follow-up services arranged: DME: [facility]BAR[facility] ROLLING WALKER & TUB BENCH  Comments (or additional information):PT REACHED GOALS OF MOD/I AND SAFE TO GO HOME ALONE WITH INTERMITTENT ASSIST FROM FAMILY AND FRIENDS/[facility] CONNECTED WITH [facility], HAS APPOINTMENT WITH ID AND MATCH INFORMATION GIVEN TO PT FOR ASSISTANCE WITH MEDICATIONS NOT ELIGIBLE FOR HOME HEALTH SERVICES DUE TO MEDICAID GUIDELINES, GIVEN HOME EXERCISE PROGRAM. PT FEELS READY TO GO HOME TODAY.  Patient/Family verbalized understanding of follow-up arrangements: Yes  Individual responsible for coordination of the follow-up plan: SELF & AMY-SISTER  Confirmed correct DME delivered: Elease Hashimoto 02/18/2016    Elease Hashimoto

## 2016-02-18 NOTE — Progress Notes (Signed)
Pharmacy Antibiotic Note  Todd Parrish is a 35 y.o. male admitted on 02/06/2016 with debility in setting of submassive PE and morbid obesity.  Pharmacy has been consulted for cefepime and vancomycin dosing for sepsis from possible UTI source. Wt 153.4 kg; Temp 102.9, creat WNL.   Plan: - Cefepime 2 gm q8h  - vancomcycin 2500 mg loading dose then vancomycin 1500 mg IV q8h - dosing for r/o sepsis for today, can de-escalate once etiology of fever determined - f/u renal fxn, wbc, temp, culture data - vanc levels as needed  Height:  (170.2 cm) Weight: (!) 338 lb 3 oz (153.4 kg) IBW/kg (Calculated) : 66.1  Temp (24hrs), Avg:101 F (38.3 C), Min:99.4 F (37.4 C), Max:102.9 F (39.4 C)  No results for input(s): WBC, CREATININE, LATICACIDVEN, VANCOTROUGH, VANCOPEAK, VANCORANDOM, GENTTROUGH, GENTPEAK, GENTRANDOM, TOBRATROUGH, TOBRAPEAK, TOBRARND, AMIKACINPEAK, AMIKACINTROU, AMIKACIN in the last 168 hours.  Estimated Creatinine Clearance: 147.4 mL/min (by C-G formula based on Cr of 0.99).    Allergies  Allergen Reactions  . Morphine And Related Nausea And Vomiting    Elevated blood pressure   Thank you for allowing pharmacy to be a part of this patient's care.  Herby Abraham, Pharm.D. [phone] 02/18/2016 5:47 PM

## 2016-02-18 NOTE — Progress Notes (Addendum)
PHYSICAL MEDICINE & REHABILITATION     PROGRESS NOTE  Subjective/Complaints:  Pt laying in bed this AM.  He states he feels good and believes he is ready for discharge. He states he has had frequent urination after drinking a Dr. Reino Kent yesterday, this is the first time he has had caffeine in a long time he states.   ROS: +Urinary freq. Denies burning urination. CP, SOB, N/V/D.   Objective: Vital Signs: Blood pressure 109/57, pulse 106, temperature 99.4 F (37.4 C), temperature source Oral, resp. rate 18, height 5\' 7"  (1.702 m), weight 153.4 kg (338 lb 3 oz), SpO2 94 %. No results found. No results for input(s): WBC, HGB, HCT, PLT in the last 72 hours. No results for input(s): NA, K, CL, GLUCOSE, BUN, CREATININE, CALCIUM in the last 72 hours.  Invalid input(s): CO CBG (last 3)  No results for input(s): GLUCAP in the last 72 hours.  Wt Readings from Last 3 Encounters:  02/12/16 153.4 kg (338 lb 3 oz)  02/01/16 155 kg (341 lb 11.4 oz)  01/14/16 157.852 kg (348 lb)    Physical Exam:  BP 109/57 mmHg  Pulse 106  Temp(Src) 99.4 F (37.4 C) (Oral)  Resp 18  Ht 5\' 7"  (1.702 m)  Wt 153.4 kg (338 lb 3 oz)  BMI 52.95 kg/m2  SpO2 94% Constitutional: Obese. No distress.  Vital signs reviewed.  HENT: Normocephalic and atraumatic.  Eyes: Conjunctivae and EOM are normal.   Cardiovascular: Normal rate and regular rhythm.No murmur heard. Respiratory: Effort normal and breath sounds normal. No respiratory distress.  GI: Soft. Bowel sounds are normal. He exhibits no distension. There is no tenderness.  Musculoskeletal: He exhibits edema (BLE). Kineseotape in place. Well healed incision right shin. Neurological: He is alert and oriented.  Speech clear.  Follows commands without difficulty.  Motor: B/l UE 5/5 proximal to distal LLE: 4+/5 proximal to distal RLE: 3-/5 hip flexion, 4+/5 knee extension, 4+/5 ankle dorsi/plantar flexion Skin: Skin is warm and dry. Right lateral  lip ulcer, healing.  Psychiatric: He has a normal mood and affect. His behavior is normal. Thought content normal.   Assessment/Plan: 1. Functional deficits secondary to submassive PE and morbid obesity which require 3+ hours per day of interdisciplinary therapy in a comprehensive inpatient rehab setting. Physiatrist is providing close team supervision and 24 hour management of active medical problems listed below. Physiatrist and rehab team continue to assess barriers to discharge/monitor patient progress toward functional and medical goals.  Function:  Bathing Bathing position   Position: Standing at sink  Bathing parts Body parts bathed by patient: Right arm, Left arm, Chest, Abdomen, Front perineal area, Buttocks, Right upper leg, Left upper leg Body parts bathed by helper: Buttocks, Back  Bathing assist Assist Level: More than reasonable time   Set up : To obtain items  Upper Body Dressing/Undressing Upper body dressing   What is the patient wearing?: Pull over shirt/dress     Pull over shirt/dress - Perfomed by patient: Thread/unthread right sleeve, Thread/unthread left sleeve, Put head through opening, Pull shirt over trunk          Upper body assist Assist Level: More than reasonable time   Set up : To obtain clothing/put away  Lower Body Dressing/Undressing Lower body dressing   What is the patient wearing?: Non-skid slipper socks, Shoes, Underwear, Pants Underwear - Performed by patient: Thread/unthread right underwear leg, Thread/unthread left underwear leg, Pull underwear up/down Underwear - Performed by helper: Thread/unthread right underwear  leg Pants- Performed by patient: Thread/unthread right pants leg, Thread/unthread left pants leg, Pull pants up/down, Fasten/unfasten pants Pants- Performed by helper: Thread/unthread right pants leg Non-skid slipper socks- Performed by patient: Don/doff right sock, Don/doff left sock Non-skid slipper socks- Performed by  helper: Don/doff right sock, Don/doff left sock       Shoes - Performed by helper: Don/doff right shoe, Don/doff left shoe          Lower body assist Assist for lower body dressing: Touching or steadying assistance (Pt > 75%)      Toileting Toileting Toileting activity did not occur: Safety/medical concerns Toileting steps completed by patient: Adjust clothing prior to toileting, Performs perineal hygiene, Adjust clothing after toileting Toileting steps completed by helper: Adjust clothing prior to toileting    Toileting assist Assist level: More than reasonable time   Transfers Chair/bed transfer   Chair/bed transfer method: Ambulatory Chair/bed transfer assist level: No Help, no cues, assistive device, takes more than a reasonable amount of time Chair/bed transfer assistive device: Walker (bariatric)     Locomotion Ambulation Ambulation activity did not occur:  (bariatric)   Max distance: 160 Assist level: No help, No cues, assistive device, takes more than a reasonable amount of time   Wheelchair Wheelchair activity did not occur: Safety/medical concerns (c/o sternal pain when attempting to propel w/c with BUE) Type: Manual Max wheelchair distance: 80 Assist Level: No help, No cues, assistive device, takes more than reasonable amount of time  Cognition Comprehension Comprehension assist level: Follows complex conversation/direction with extra time/assistive device  Expression Expression assist level: Expresses complex ideas: With extra time/assistive device  Social Interaction Social Interaction assist level: Interacts appropriately with others with medication or extra time (anti-anxiety, antidepressant).  Problem Solving Problem solving assist level: Solves complex problems: With extra time  Memory Memory assist level: More than reasonable amount of time    Medical Problem List and Plan: 1. Debility secondary to submassive PE and morbid obesity.   DC today 2.  Submassive PE/DVT Prophylaxis/Anticoagulation: Mechanical: Sequential compression devices, below knee Bilateral lower extremities. Has IVC filter in place and to follow up with IVR in 8-12 weeks. Follow up with Pulmonary in 3 months. No chemical anticoagulation due to retroperitoneal bleed. 3. Pain Management: Ultram prn for pain.  4. Mood: LCSW to follow for evaluation and support.  5. Neuropsych: This patient is capable of making decisions on his own behalf. Appreciate neuropsych eval, patient doing relatively well. 6. Skin/Wound Care: routine pressure relief measures.  7. Fluids/Electrolytes/Nutrition: Monitor I/Os.  8. Aspiration PNA:   Doxycyline completed 9. Retroperitoneal bleed with ABLA: H/H stable after transfusion with 2 units.  Added iron for supplement.              -Hb 9.5 on 2/20 10. Cardiac arrest/PEA: Due to submassive PE. (see #2) 11. Large scrotal hernia: Follow up with PMD for referral to surgery in as outpt.  12. Morbid obesity: have allowed a regular diet  -discussed dietary choices, avoidance of excessive carbs  -he says that [place] road and fast food has been what's easy/available---understands that he needs to eat better  13. Right bicondylar tibial plateau fracture s/p fasciotomy RLE and ORIF  WBAT.  14. H/o HIV: Followed by Dr. Ninetta Lights.  15. Prediabetes: Hgb A1c-6.0. Dietician educate patient on appropriate diet.  16. AKI: Resolved   CR 0.99 on 2/20 17. Lip ulcer  Vaseline ordered. 18. HTN  Improved on lisinopril 10. 19. Urinary frequency  Will  order UA, Ucx and consider abx prior to d/c   LOS (Days) 12 A FACE TO FACE EVALUATION WAS PERFORMED  Ankit Karis Juba 02/18/2016 8:41 AM

## 2016-02-18 NOTE — Progress Notes (Signed)
Social Work Patient ID: Todd Parrish, male   DOB: Aug 16, 1981, 35 y.o.   MRN: [id number] Met with pt to go over the [employer] and how to use the forms, he reported he understood. He is a little down since finding out he has a UTI and now being treated for it. He is still medically stable to go home today, according to Pam-PA. Pt's reports his ride is coming [place] this afternoon. Asked if someone was staying with him tonight for his first night home and he was not sure about this. He is aware it would help ease his transition to home. Also discussed going to [facility] to his Mom's home where his children are and he felt he needed to stay here in town with his follow up appointments. He is ready to go home today.

## 2016-02-18 NOTE — Discharge Instructions (Signed)
Inpatient Rehab Discharge Instructions  Chazz Philson Discharge date and time:  02/18/16  Activities/Precautions/ Functional Status: Activity: activity as tolerated. Use walker Diet: diabetic diet--due to pre-diabetes Wound Care: keep wound clean and dry    Functional status:  _X__ No restrictions     ___ Walk up steps independently ___ 24/7 supervision/assistance   ___ Walk up steps with assistance ___ Intermittent supervision/assistance  _X__ Bathe/dress independently ___ Walk with walker     ___ Bathe/dress with assistance ___ Walk Independently    ___ Shower independently ___ Walk with assistance    ___ Shower with assistance _X__ No alcohol     ___ Return to work/school ________  Special Instructions:    COMMUNITY REFERRALS UPON DISCHARGE:   None-doesn't qualify for follow up therapies via pending Medicaid-Team gave home exercise program to patient to follow  Medical Equipment/Items Ordered:bariatric rolling walker & tub bench  Agency/Supplier:[facility]- [phone]   My questions have been answered and I understand these instructions. I will adhere to these goals and the provided educational materials after my discharge from the hospital.  Patient/Caregiver Signature _______________________________ Date __________  Clinician Signature _______________________________________ Date __________  Please bring this form and your medication list with you to all your follow-up doctor's appointments.

## 2016-02-19 DIAGNOSIS — A419 Sepsis, unspecified organism: Secondary | ICD-10-CM | POA: Diagnosis not present

## 2016-02-19 NOTE — Progress Notes (Signed)
Social Work Patient ID: Todd Parrish, male   DOB: 05/30/1981, 35 y.o.   MRN: [id number] Spoke with MD & PA who report addressing pt's fever and treatments. MD reports maybe medically ready tomorrow. Aware has been discharged from therapies due to he has reached his goals of mod/i level. Wait and see how he does today with antibiotics.

## 2016-02-19 NOTE — Progress Notes (Signed)
Social Work Patient ID: Todd Parrish, male   DOB: [birth year], 35 y.o.   MRN: [id number] Pt is feeling better now better than yesterday. According to MD if afebrile he will go home tomorrow. Will see in am. Pt is aware and agreeable to this.

## 2016-02-19 NOTE — Patient Care Conference (Signed)
Inpatient RehabilitationTeam Conference and Plan of Care Update Date: 02/19/2016   Time: 2:00 PM    Patient Name: Todd Parrish      Medical Record Number: [id number]  Date of Birth: 08-Mar-1981 Sex: Male         Room/Bed: 4W15C/4W15C-01 Payor Info: Payor: MEDICAID PENDING / Plan: MEDICAID PENDING / Product Type: *No Product type* /    Admitting Diagnosis: debility  Admit Date/Time:  02/06/2016  3:06 PM Admission Comments: No comment available   Primary Diagnosis:  Physical debility Principal Problem: Physical debility  Patient Active Problem List   Diagnosis Date Noted  . Sepsis ([place]) to UTI 02/19/2016  . Urinary frequency   . Benign essential HTN   . Lip ulcer   . Pulmonary embolism without acute cor pulmonale ([place])   . Aspiration pneumonia of right lung ([place])   . Tibial plateau fracture   . Physical debility 02/06/2016  . Bleeding   . Blood clot in vein   . Vertigo   . HIV disease ([place])   . Pre-diabetes   . Acute blood loss anemia   . Retroperitoneal bleed   . Blood loss anemia   . PEA (Pulseless electrical activity) ([place])   . Respiratory failure ([place])   . SOB (shortness of breath)   . Metabolic acidosis, increased anion gap   . AKI (acute kidney injury) ([place])   . Pulmonary embolism on left ([place])   . Pulmonary embolism ([place])   . Cardiac arrest ([place]) 01/29/2016  . Neuropathy ([place]) 12/23/2015  . Benign paroxysmal positional vertigo 12/23/2015  . Morbid obesity ([place]) 12/12/2015  . Constipation due to opioid therapy 12/03/2015  . Seasonal allergies 12/03/2015  . Human immunodeficiency virus (HIV) disease ([place]) 11/30/2015  . Left inguinal hernia   . Right medial tibial plateau fracture 11/25/2015    Expected Discharge Date: Expected Discharge Date: 02/18/16  Team Members Present: Physician leading conference: Dr. Delice Lesch Social Worker Present: Ovidio Kin, LCSW Nurse Present: Heather Roberts, RN PT Present: Raylene Everts, PT;Other (comment) (Victoria_PT) OT  Present: Willeen Cass, OT SLP Present: Windell Moulding, SLP PPS Coordinator present : Daiva Nakayama, RN, CRRN     Current Status/Progress Goal Weekly Team Focus  Medical        medically stable     Bowel/Bladder   continent of bowel and bladder; LBM 02/17/16  remain continent of bowel and bladder, independent  independent with toileting   Swallow/Nutrition/ Hydration     na        ADL's   Mod/i    mod/i level   DC from therapies 2/28  Mobility     mod/i   mod/i level   DC from therapies 2/28  Communication     na        Safety/Cognition/ Behavioral Observations    no unsafe behaviors        Pain   occasional complaints of pain to back. Tylenol q 4hr prn  <4   assess pain q 4 hrs and prn   Skin   CDI  No new skin breakdown and infection while on rehab   assess skin q shift and prn      *See Care Plan and progress notes for long and short-term goals.  Barriers to Discharge:   medically stable   Possible Resolutions to Barriers:    treating with antibiotics   Discharge Planning/Teaching Needs:    Home with intermittent assist from sister's and friend/church members-will be safe alone-mod/i in room  2/27     Team Discussion:  Met his goals and discharged from therapies-mod/i in room. Medical issues-septic being treated now. If afebrile tomorrow will discharge home.  Revisions to Treatment Plan:  Cancelled discharge due to medical issues      Mathis Cashman, Gardiner Rhyme 02/19/2016, 9:44 AM

## 2016-02-19 NOTE — Progress Notes (Addendum)
[facility] PHYSICAL MEDICINE & REHABILITATION     PROGRESS NOTE  Subjective/Complaints:  Pt laying in bed this AM.  He was septic yesterday, which posted his charge, likely for urinary source.  He states he is feeling back to normal this AM.   ROS: Denies burning urination, fevers, chills. CP, SOB, N/V/D.   Objective: Vital Signs: Blood pressure 112/61, pulse 100, temperature 99 F (37.2 C), temperature source Oral, resp. rate 19, height 5\' 7"  (1.702 m), weight 153.4 kg (338 lb 3 oz), SpO2 98 %. No results found.  Recent Labs  02/18/16 1923  WBC 19.2*  HGB 9.2*  HCT 29.8*  PLT 285    Recent Labs  02/18/16 1923  NA 134*  K 4.0  CL 99*  GLUCOSE 141*  BUN 8  CREATININE 1.01  CALCIUM 8.9   CBG (last 3)  No results for input(s): GLUCAP in the last 72 hours.  Wt Readings from Last 3 Encounters:  02/12/16 153.4 kg (338 lb 3 oz)  02/01/16 155 kg (341 lb 11.4 oz)  01/14/16 157.852 kg (348 lb)    Physical Exam:  BP 112/61 mmHg  Pulse 100  Temp(Src) 99 F (37.2 C) (Oral)  Resp 19  Ht 5\' 7"  (1.702 m)  Wt 153.4 kg (338 lb 3 oz)  BMI 52.95 kg/m2  SpO2 98% Constitutional: Obese. No distress.  Vital signs reviewed.  HENT: Normocephalic and atraumatic.  Eyes: Conjunctivae and EOM are normal.   Cardiovascular: Normal rate and regular rhythm.No murmur heard. Respiratory: Effort normal and breath sounds normal. No respiratory distress.  GI: Soft. Bowel sounds are normal. He exhibits no distension. There is no tenderness.  Musculoskeletal: He exhibits edema (BLE). Kineseotape in place. Well healed incision right shin. Neurological: He is alert and oriented.  Speech clear.  Follows commands without difficulty.  Motor: B/l UE 5/5 proximal to distal LLE: 4+/5 proximal to distal RLE: 3-/5 hip flexion, 4+/5 knee extension, 4+/5 ankle dorsi/plantar flexion Skin: Skin is warm and dry. Right lateral lip ulcer, healing.  Psychiatric: He has a normal mood and affect. His  behavior is normal. Thought content normal.   Assessment/Plan: 1. Functional deficits secondary to submassive PE and morbid obesity which require 3+ hours per day of interdisciplinary therapy in a comprehensive inpatient rehab setting. Physiatrist is providing close team supervision and 24 hour management of active medical problems listed below. Physiatrist and rehab team continue to assess barriers to discharge/monitor patient progress toward functional and medical goals.  Function:  Bathing Bathing position   Position: Standing at sink  Bathing parts Body parts bathed by patient: Right arm, Left arm, Chest, Abdomen, Front perineal area, Buttocks, Right upper leg, Left upper leg Body parts bathed by helper: Buttocks, Back  Bathing assist Assist Level: More than reasonable time   Set up : To obtain items  Upper Body Dressing/Undressing Upper body dressing   What is the patient wearing?: Pull over shirt/dress     Pull over shirt/dress - Perfomed by patient: Thread/unthread right sleeve, Thread/unthread left sleeve, Put head through opening, Pull shirt over trunk          Upper body assist Assist Level: More than reasonable time   Set up : To obtain clothing/put away  Lower Body Dressing/Undressing Lower body dressing   What is the patient wearing?: Non-skid slipper socks, Shoes, Underwear, Pants Underwear - Performed by patient: Thread/unthread right underwear leg, Thread/unthread left underwear leg, Pull underwear up/down Underwear - Performed by helper: Thread/unthread right underwear leg  Pants- Performed by patient: Thread/unthread right pants leg, Thread/unthread left pants leg, Pull pants up/down, Fasten/unfasten pants Pants- Performed by helper: Thread/unthread right pants leg Non-skid slipper socks- Performed by patient: Don/doff right sock, Don/doff left sock Non-skid slipper socks- Performed by helper: Don/doff right sock, Don/doff left sock       Shoes - Performed  by helper: Don/doff right shoe, Don/doff left shoe          Lower body assist Assist for lower body dressing: Touching or steadying assistance (Pt > 75%)      Toileting Toileting Toileting activity did not occur: Safety/medical concerns Toileting steps completed by patient: Adjust clothing prior to toileting, Performs perineal hygiene, Adjust clothing after toileting Toileting steps completed by helper: Adjust clothing prior to toileting    Toileting assist Assist level: More than reasonable time   Transfers Chair/bed transfer   Chair/bed transfer method: Ambulatory Chair/bed transfer assist level: No Help, no cues, assistive device, takes more than a reasonable amount of time Chair/bed transfer assistive device: Walker (bariatric)     Locomotion Ambulation Ambulation activity did not occur:  (bariatric)   Max distance: 160 Assist level: No help, No cues, assistive device, takes more than a reasonable amount of time   Wheelchair Wheelchair activity did not occur: Safety/medical concerns (c/o sternal pain when attempting to propel w/c with BUE) Type: Manual Max wheelchair distance: 80 Assist Level: No help, No cues, assistive device, takes more than reasonable amount of time  Cognition Comprehension Comprehension assist level: Follows complex conversation/direction with extra time/assistive device  Expression Expression assist level: Expresses complex ideas: With extra time/assistive device  Social Interaction Social Interaction assist level: Interacts appropriately with others with medication or extra time (anti-anxiety, antidepressant).  Problem Solving Problem solving assist level: Solves complex problems: With extra time  Memory Memory assist level: More than reasonable amount of time    Medical Problem List and Plan: 1. Debility secondary to submassive PE and morbid obesity.   Pt was planned for d/c yesterday, but became septic, will plan for Dc tomorrow if  afebrile 2. Submassive PE/DVT Prophylaxis/Anticoagulation: Mechanical: Sequential compression devices, below knee Bilateral lower extremities. Has IVC filter in place and to follow up with IVR in 8-12 weeks. Follow up with Pulmonary in 3 months. No chemical anticoagulation due to retroperitoneal bleed. 3. Pain Management: Ultram prn for pain.  4. Mood: LCSW to follow for evaluation and support.  5. Neuropsych: This patient is capable of making decisions on his own behalf. Appreciate neuropsych eval, patient doing relatively well. 6. Skin/Wound Care: routine pressure relief measures.  7. Fluids/Electrolytes/Nutrition: Monitor I/Os.  8. Aspiration PNA:   Doxycyline completed 9. Retroperitoneal bleed with ABLA: H/H stable after transfusion with 2 units.  Added iron for supplement.              -Hb 9.5 on 2/20 10. Cardiac arrest/PEA: Due to submassive PE. (see #2) 11. Large scrotal hernia: Follow up with PMD for referral to surgery in as outpt.  12. Morbid obesity: have allowed a regular diet  -discussed dietary choices, avoidance of excessive carbs  -[place] says [place] and fast food has been what's easy/available---understands that he needs to eat better  13. Right bicondylar tibial plateau fracture s/p fasciotomy RLE and ORIF  WBAT.  14. H/o HIV: Followed by Dr. Ninetta Lights.  15. Prediabetes: Hgb A1c-6.0. Dietician educate patient on appropriate diet.  16. AKI:   CR 1.01 on 2/28 17. Lip ulcer  Vaseline ordered.  18. HTN  Improved on lisinopril 10. 19. Leukocytosis 2/2 Urosepsis  Flu neg  UA with bacteria, Ucx pending  Cont abx  Blood cx pending   LOS (Days) 13 A FACE TO FACE EVALUATION WAS PERFORMED  Sheyanne Munley Karis Juba 02/19/2016 11:17 AM
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
## 2016-02-20 ENCOUNTER — Inpatient Hospital Stay: Payer: Self-pay

## 2016-02-20 DIAGNOSIS — D72829 Elevated white blood cell count, unspecified: Secondary | ICD-10-CM

## 2016-02-20 DIAGNOSIS — A4151 Sepsis due to Escherichia coli [E. coli]: Secondary | ICD-10-CM

## 2016-02-20 LAB — CBC WITH DIFFERENTIAL/PLATELET
Basophils Absolute: 0 10*3/uL (ref 0.0–0.1)
Basophils Relative: 0 %
EOS ABS: 0.2 10*3/uL (ref 0.0–0.7)
EOS PCT: 3 %
HCT: 35.7 % — ABNORMAL LOW (ref 39.0–52.0)
Hemoglobin: 11.2 g/dL — ABNORMAL LOW (ref 13.0–17.0)
Lymphocytes Relative: 15 %
Lymphs Abs: 1 10*3/uL (ref 0.7–4.0)
MCH: 24.3 pg — ABNORMAL LOW (ref 26.0–34.0)
MCHC: 31.4 g/dL (ref 30.0–36.0)
MCV: 77.4 fL — AB (ref 78.0–100.0)
MONO ABS: 0.5 10*3/uL (ref 0.1–1.0)
Monocytes Relative: 8 %
NEUTROS PCT: 74 %
Neutro Abs: 4.7 10*3/uL (ref 1.7–7.7)
PLATELETS: UNDETERMINED 10*3/uL (ref 150–400)
RBC: 4.61 MIL/uL (ref 4.22–5.81)
RDW: 15.5 % (ref 11.5–15.5)
WBC: 6.4 10*3/uL (ref 4.0–10.5)

## 2016-02-20 LAB — URINE CULTURE

## 2016-02-20 MED ORDER — CEPHALEXIN 250 MG PO CAPS
500.0000 mg | ORAL_CAPSULE | Freq: Three times a day (TID) | ORAL | Status: DC
  Administered 2016-02-20: 500 mg via ORAL
  Filled 2016-02-20: qty 2

## 2016-02-20 MED ORDER — CEPHALEXIN 500 MG PO CAPS: 500.0000 mg | ORAL_CAPSULE | Freq: Three times a day (TID) | ORAL | Status: DC

## 2016-02-20 NOTE — Progress Notes (Addendum)
[facility] [place] PHYSICAL MEDICINE & REHABILITATION     PROGRESS NOTE  Subjective/Complaints:  Pt sleeping this AM.  He has been doing well for the past ~36 hours and is ready to go home.    ROS: Denies burning urination, fevers, chills. CP, SOB, N/V/D.   Objective: Vital Signs: Blood pressure 129/65, pulse 98, temperature 98.3 F (36.8 C), temperature source Oral, resp. rate 18, height 5\' 7"  (1.702 m), weight 153.4 kg (338 lb 3 oz), SpO2 98 %. No results found.  Recent Labs  02/18/16 1923  WBC 19.2*  HGB 9.2*  HCT 29.8*  PLT 285    Recent Labs  02/18/16 1923  NA 134*  K 4.0  CL 99*  GLUCOSE 141*  BUN 8  CREATININE 1.01  CALCIUM 8.9   CBG (last 3)  No results for input(s): GLUCAP in the last 72 hours.  Wt Readings from Last 3 Encounters:  02/12/16 153.4 kg (338 lb 3 oz)  02/01/16 155 kg (341 lb 11.4 oz)  01/14/16 157.852 kg (348 lb)    Physical Exam:  BP 129/65 mmHg  Pulse 98  Temp(Src) 98.3 F (36.8 C) (Oral)  Resp 18  Ht 5\' 7"  (1.702 m)  Wt 153.4 kg (338 lb 3 oz)  BMI 52.95 kg/m2  SpO2 98% Constitutional: Obese. No distress.  Vital signs reviewed.  HENT: Normocephalic and atraumatic.  Eyes: Conjunctivae and EOM are normal.   Cardiovascular: Normal rate and regular rhythm.No murmur heard. Respiratory: Effort normal and breath sounds normal. No respiratory distress.  GI: Soft. Bowel sounds are normal. He exhibits no distension. There is no tenderness.  Musculoskeletal: He exhibits edema (BLE). Kineseotape in place. Well healed incision right shin. Neurological: He is alert and oriented.  Speech clear.  Follows commands without difficulty.  Motor: B/l UE 5/5 proximal to distal LLE: 4+/5 proximal to distal RLE: 4-/5 hip flexion, 4+/5 knee extension, 4+/5 ankle dorsi/plantar flexion Skin: Skin is warm and dry. Right lateral lip ulcer, healed.  Psychiatric: He has a normal mood and affect. His behavior is normal. Thought content normal.    Assessment/Plan: 1. Functional deficits secondary to submassive PE and morbid obesity which require 3+ hours per day of interdisciplinary therapy in a comprehensive inpatient rehab setting. Physiatrist is providing close team supervision and 24 hour management of active medical problems listed below. Physiatrist and rehab team continue to assess barriers to discharge/monitor patient progress toward functional and medical goals.  Function:  Bathing Bathing position   Position: Standing at sink  Bathing parts Body parts bathed by patient: Right arm, Left arm, Chest, Abdomen, Front perineal area, Buttocks, Right upper leg, Left upper leg Body parts bathed by helper: Buttocks, Back  Bathing assist Assist Level: More than reasonable time   Set up : To obtain items  Upper Body Dressing/Undressing Upper body dressing   What is the patient wearing?: Pull over shirt/dress     Pull over shirt/dress - Perfomed by patient: Thread/unthread right sleeve, Thread/unthread left sleeve, Put head through opening, Pull shirt over trunk          Upper body assist Assist Level: More than reasonable time   Set up : To obtain clothing/put away  Lower Body Dressing/Undressing Lower body dressing   What is the patient wearing?: Non-skid slipper socks, Shoes, Underwear, Pants Underwear - Performed by patient: Thread/unthread right underwear leg, Thread/unthread left underwear leg, Pull underwear up/down Underwear - Performed by helper: Thread/unthread right underwear leg Pants- Performed by patient: Thread/unthread right pants leg,  Thread/unthread left pants leg, Pull pants up/down, Fasten/unfasten pants Pants- Performed by helper: Thread/unthread right pants leg Non-skid slipper socks- Performed by patient: Don/doff right sock, Don/doff left sock Non-skid slipper socks- Performed by helper: Don/doff right sock, Don/doff left sock       Shoes - Performed by helper: Don/doff right shoe, Don/doff left  shoe          Lower body assist Assist for lower body dressing: Touching or steadying assistance (Pt > 75%)      Toileting Toileting Toileting activity did not occur: Safety/medical concerns Toileting steps completed by patient: Adjust clothing prior to toileting, Performs perineal hygiene, Adjust clothing after toileting Toileting steps completed by helper: Adjust clothing prior to toileting    Toileting assist Assist level: More than reasonable time   Transfers Chair/bed transfer   Chair/bed transfer method: Ambulatory Chair/bed transfer assist level: No Help, no cues, assistive device, takes more than a reasonable amount of time Chair/bed transfer assistive device: Walker (bariatric)     Locomotion Ambulation Ambulation activity did not occur:  (bariatric)   Max distance: 160 Assist level: No help, No cues, assistive device, takes more than a reasonable amount of time   Wheelchair Wheelchair activity did not occur: Safety/medical concerns (c/o sternal pain when attempting to propel w/c with BUE) Type: Manual Max wheelchair distance: 80 Assist Level: No help, No cues, assistive device, takes more than reasonable amount of time  Cognition Comprehension Comprehension assist level: Follows complex conversation/direction with extra time/assistive device  Expression Expression assist level: Expresses complex ideas: With extra time/assistive device  Social Interaction Social Interaction assist level: Interacts appropriately with others with medication or extra time (anti-anxiety, antidepressant).  Problem Solving Problem solving assist level: Solves complex problems: With extra time  Memory Memory assist level: More than reasonable amount of time    Medical Problem List and Plan: 1. Debility secondary to submassive PE and morbid obesity.   D/c today 2. Submassive PE/DVT Prophylaxis/Anticoagulation: Mechanical: Sequential compression devices, below knee Bilateral lower  extremities. Has IVC filter in place and to follow up with IVR in 8-12 weeks. Follow up with Pulmonary in 3 months. No chemical anticoagulation due to retroperitoneal bleed. 3. Pain Management: Ultram prn for pain.  4. Mood: LCSW to follow for evaluation and support.  5. Neuropsych: This patient is capable of making decisions on his own behalf. Appreciate neuropsych eval, patient doing relatively well. 6. Skin/Wound Care: routine pressure relief measures.  7. Fluids/Electrolytes/Nutrition: Monitor I/Os.  8. Aspiration PNA:   Doxycyline completed 9. Retroperitoneal bleed with ABLA: H/H stable after transfusion with 2 units.  Added iron for supplement.              -Hb 9.5 on 2/20 10. Cardiac arrest/PEA: Due to submassive PE. (see #2) 11. Large scrotal hernia: Follow up with PMD for referral to surgery in as outpt.  12. Morbid obesity: have allowed a regular diet  -discussed dietary choices, avoidance of excessive carbs  -he says [place] and fast food has been what's easy/available---understands that he needs to eat better  13. Right bicondylar tibial plateau fracture s/p fasciotomy RLE and ORIF  WBAT.  14. H/o HIV: Followed by Dr. Ninetta Lights.  15. Prediabetes: Hgb A1c-6.0. Dietician educate patient on appropriate diet.  16. AKI:   CR 1.01 on 2/28 17. Lip ulcer  Vaseline ordered. 18. HTN  Improved on lisinopril 10. 19. Leukocytosis 2/2 Urosepsis, will order repeat CBC  Flu neg  UA with bacteria,  Ucx E. Coli  Cont abx, will d/c on oral  Blood cx NGTD   LOS (Days) 14 A FACE TO FACE EVALUATION WAS PERFORMED  Dreon Pineda Karis Juba 02/20/2016 10:29 AM

## 2016-02-20 NOTE — Progress Notes (Signed)
Social Work Patient ID: Todd Parrish, male   DOB: 26-Dec-1980, 35 y.o.   MRN: [id number] Met with pt he has found a ride via a church member. He is feeling better they will assist him in and get him settled. He feels that each time he has attempted to go home something has happened. This is the third time so it is the charm, he does feel ready and thinks once settled he can take care of himself.  His Mom and son's will be in town for [place] and mom is hoping they can stay with their dad then. He feels by then he will be ready. Ready for discharge today.

## 2016-02-20 NOTE — Progress Notes (Signed)
Social Work Patient ID: Todd Parrish, male   DOB: 02-18-1981, 35 y.o.   MRN: [id number] Met with pt who reports he doesn't have a ride home, due to sister is in [place] working today. Discussed could get him a taxi but they will not make sure he gets inside and is settled in. Asked if I could call some of his church members to transport him home, he reports he will call them and this worker will check back with him regarding what he found out.

## 2016-02-21 ENCOUNTER — Inpatient Hospital Stay: Payer: Self-pay

## 2016-02-23 LAB — CULTURE, BLOOD (ROUTINE X 2)
Culture: NO GROWTH
Culture: NO GROWTH
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
## 2016-03-15 DIAGNOSIS — Z719 Counseling, unspecified: Secondary | ICD-10-CM

## 2016-03-16 ENCOUNTER — Telehealth: Payer: Self-pay | Admitting: *Deleted

## 2016-03-16 ENCOUNTER — Ambulatory Visit: Payer: Self-pay | Admitting: Infectious Diseases

## 2016-03-16 NOTE — Telephone Encounter (Signed)
Make new f/u appt for 03/25/16.
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
## 2016-03-25 ENCOUNTER — Ambulatory Visit: Payer: Medicaid Other | Admitting: Infectious Diseases

## 2016-04-20 ENCOUNTER — Inpatient Hospital Stay: Payer: Self-pay | Admitting: Internal Medicine

## 2016-07-07 ENCOUNTER — Other Ambulatory Visit (HOSPITAL_COMMUNITY): Payer: Self-pay | Admitting: Interventional Radiology

## 2016-07-07 DIAGNOSIS — S36892A Contusion of other intra-abdominal organs, initial encounter: Secondary | ICD-10-CM

## 2016-07-09 ENCOUNTER — Other Ambulatory Visit (HOSPITAL_COMMUNITY): Payer: Self-pay | Admitting: Interventional Radiology

## 2016-07-30 ENCOUNTER — Inpatient Hospital Stay: Admission: RE | Admit: 2016-07-30 | Payer: Medicaid Other | Source: Ambulatory Visit

## 2016-08-05 ENCOUNTER — Ambulatory Visit
Admission: RE | Admit: 2016-08-05 | Discharge: 2016-08-05 | Disposition: A | Discharge status: Home / Self Care | Payer: Medicaid Other | Source: Ambulatory Visit | Attending: Interventional Radiology | Admitting: Interventional Radiology

## 2016-08-05 DIAGNOSIS — S36892A Contusion of other intra-abdominal organs, initial encounter: Secondary | ICD-10-CM

## 2016-08-05 HISTORY — PX: IR GENERIC HISTORICAL: IMG1180011
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
## 2016-09-23 ENCOUNTER — Telehealth: Payer: Self-pay | Admitting: Radiology

## 2016-09-23 ENCOUNTER — Other Ambulatory Visit: Payer: Self-pay | Admitting: Radiology

## 2016-09-23 NOTE — Telephone Encounter (Signed)
Left msg requesting patient call back for update re:  IVC filter/? Plans for surgical repair of hernia.    Calen Posch Carmell AustriaGales, RN 09/23/2016 8:27 AM

## 2016-10-13 ENCOUNTER — Telehealth: Payer: Self-pay | Admitting: Radiology

## 2016-10-13 NOTE — Telephone Encounter (Signed)
Patient states that he has postponed his hernia repair for now.  He prefers to delay IVC filter retrieval until post hernia repair.  He is uncertain at present as to possible dates.  Armond Cuthrell Carmell AustriaGales, RN 10/13/2016 3:49 PM

## 2016-11-19 ENCOUNTER — Other Ambulatory Visit (HOSPITAL_COMMUNITY): Payer: Self-pay | Admitting: Interventional Radiology

## 2016-11-19 DIAGNOSIS — S36892A Contusion of other intra-abdominal organs, initial encounter: Secondary | ICD-10-CM

## 2016-12-02 ENCOUNTER — Ambulatory Visit
Admission: RE | Admit: 2016-12-02 | Discharge: 2016-12-02 | Disposition: A | Discharge status: Home / Self Care | Payer: Medicaid Other | Source: Ambulatory Visit | Attending: Interventional Radiology | Admitting: Interventional Radiology

## 2016-12-02 DIAGNOSIS — S36892A Contusion of other intra-abdominal organs, initial encounter: Secondary | ICD-10-CM

## 2016-12-02 HISTORY — PX: IR GENERIC HISTORICAL: IMG1180011
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
## 2016-12-24 ENCOUNTER — Encounter: Payer: Self-pay | Admitting: Interventional Radiology

## 2016-12-25 ENCOUNTER — Encounter: Payer: Self-pay | Admitting: Interventional Radiology

## 2016-12-29 ENCOUNTER — Emergency Department (HOSPITAL_BASED_OUTPATIENT_CLINIC_OR_DEPARTMENT_OTHER): Payer: Medicaid Other

## 2016-12-29 ENCOUNTER — Emergency Department (HOSPITAL_COMMUNITY)
Admission: EM | Admit: 2016-12-29 | Discharge: 2016-12-29 | Disposition: A | Discharge status: Home / Self Care | Payer: Medicaid Other | Attending: Emergency Medicine | Admitting: Emergency Medicine

## 2016-12-29 DIAGNOSIS — M7989 Other specified soft tissue disorders: Secondary | ICD-10-CM

## 2016-12-29 DIAGNOSIS — M79609 Pain in unspecified limb: Secondary | ICD-10-CM

## 2016-12-29 DIAGNOSIS — L03115 Cellulitis of right lower limb: Secondary | ICD-10-CM

## 2016-12-29 DIAGNOSIS — I10 Essential (primary) hypertension: Secondary | ICD-10-CM | POA: Insufficient documentation

## 2016-12-29 DIAGNOSIS — M79604 Pain in right leg: Secondary | ICD-10-CM | POA: Diagnosis present

## 2016-12-29 MED ORDER — OXYCODONE-ACETAMINOPHEN 5-325 MG PO TABS: 2.0000 | ORAL_TABLET | ORAL | 0 refills | Status: DC | PRN

## 2016-12-29 MED ORDER — CLINDAMYCIN HCL 150 MG PO CAPS: 150.0000 mg | ORAL_CAPSULE | Freq: Four times a day (QID) | ORAL | 0 refills | Status: DC

## 2016-12-29 MED ORDER — ONDANSETRON 8 MG PO TBDP
8.0000 mg | ORAL_TABLET | Freq: Once | ORAL | Status: AC
  Administered 2016-12-29: 8 mg via ORAL
  Filled 2016-12-29: qty 1

## 2016-12-29 MED ORDER — ONDANSETRON 8 MG PO TBDP: 8.0000 mg | ORAL_TABLET | Freq: Three times a day (TID) | ORAL | 0 refills | Status: DC | PRN

## 2016-12-29 MED ORDER — OXYCODONE-ACETAMINOPHEN 5-325 MG PO TABS
2.0000 | ORAL_TABLET | Freq: Once | ORAL | Status: AC
  Administered 2016-12-29: 2 via ORAL
  Filled 2016-12-29: qty 2
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
## 2016-12-29 NOTE — ED Notes (Signed)
Bed: [id number] Expected date:  Expected time:  Means of arrival:  Comments: EMS- 35yo M, possible DVT

## 2016-12-29 NOTE — ED Triage Notes (Signed)
Pt c/o of 9/10 pain in right leg since 1/7. Pt states that he requires assistance to lift his left due to the heaviness sensation.

## 2016-12-29 NOTE — Progress Notes (Signed)
**  Preliminary report by tech**  Right lower extremity venous duplex complete. There is no obvious evidence of deep or superficial vein thrombosis involving the right lower extremity. All clearly visualized vessels appear patent and compressible. There is no evidence of a Baker's cyst on the right. Results were given to the patient's nurse, Fleet Contrasachel.  12/29/16 10:53 AM Olen CordialGreg Quanika Solem RVT

## 2017-01-05 ENCOUNTER — Telehealth (HOSPITAL_COMMUNITY): Payer: Self-pay

## 2017-01-05 NOTE — Telephone Encounter (Signed)
Called to schedule ivc filter retrieval, pt stated that he just got out of the hospital and has cellulitis. Would like to schedule retrieval in the beginning of February when he is better. AW

## 2017-01-06 ENCOUNTER — Ambulatory Visit: Payer: Medicaid Other | Admitting: Internal Medicine

## 2017-01-12 ENCOUNTER — Encounter: Payer: Medicaid Other | Attending: Internal Medicine | Admitting: Internal Medicine

## 2017-01-12 DIAGNOSIS — Z885 Allergy status to narcotic agent status: Secondary | ICD-10-CM | POA: Diagnosis not present

## 2017-01-12 DIAGNOSIS — K219 Gastro-esophageal reflux disease without esophagitis: Secondary | ICD-10-CM | POA: Insufficient documentation

## 2017-01-12 DIAGNOSIS — L97211 Non-pressure chronic ulcer of right calf limited to breakdown of skin: Secondary | ICD-10-CM | POA: Diagnosis not present

## 2017-01-12 DIAGNOSIS — I87331 Chronic venous hypertension (idiopathic) with ulcer and inflammation of right lower extremity: Secondary | ICD-10-CM | POA: Diagnosis not present

## 2017-01-12 DIAGNOSIS — I890 Lymphedema, not elsewhere classified: Secondary | ICD-10-CM | POA: Insufficient documentation

## 2017-01-13 NOTE — Progress Notes (Signed)
LEVERETT, CAMPLIN ([id number]) Visit Report for 01/12/2017 Abuse/Suicide Risk Screen Details Patient Name: Todd Parrish, Todd Parrish Date of Service: 01/12/2017 9:00 AM Medical Record Number: [id number] Patient Account Number: [id number] Date of Birth/Sex: 1981/12/08 (35 y.o. Male) Treating RN: Clover Mealy, RN, BSN, Rita Primary Care Maicy Filip: PATIENT, NO Other Clinician: Referring Klee Kolek: Lorre Nick Treating Keyondra Lagrand/Extender: Maxwell Caul Weeks in Treatment: 0 Abuse/Suicide Risk Screen Items Answer ABUSE/SUICIDE RISK SCREEN: Has anyone close to you tried to hurt or harm you recentlyo No Do you feel uncomfortable with anyone in your familyo No Has anyone forced you do things that you didnot want to doo No Do you have any thoughts of harming yourselfo No Patient displays signs or symptoms of abuse and/or neglect. No Electronic Signature(s) Signed: 01/12/2017 5:02:54 PM By: Elpidio Eric BSN, RN Entered By: Elpidio Eric on 01/12/2017 10:13:56 Todd Parrish ([id number]) -------------------------------------------------------------------------------- Activities of Daily Living Details Patient Name: Todd Parrish Date of Service: 01/12/2017 9:00 AM Medical Record Number: [id number] Patient Account Number: [id number] Date of Birth/Sex: 09-24-1981 (35 y.o. Male) Treating RN: Clover Mealy, RN, BSN, [employer] Primary Care Lev Cervone: PATIENT, NO Other Clinician: Referring Norris Bodley: Lorre Nick Treating Maryetta Shafer/Extender: Maxwell Caul Weeks in Treatment: 0 Activities of Daily Living Items Answer Activities of Daily Living (Please select one for each item) Drive Automobile Completely Able Take Medications Completely Able Use Telephone Completely Able Care for Appearance Completely Able Use Toilet Completely Able Bath / Shower Completely Able Dress Self Completely Able Feed Self Completely Able Walk Completely Able Get In / Out Bed Completely Able Housework Completely Able Prepare Meals  Completely Able Handle Money Completely Able Shop for Self Completely Able Electronic Signature(s) Signed: 01/12/2017 5:02:54 PM By: Elpidio Eric BSN, RN Entered By: Elpidio Eric on 01/12/2017 10:14:18 Todd Parrish ([id number]) -------------------------------------------------------------------------------- Education Assessment Details Patient Name: Todd Parrish Date of Service: 01/12/2017 9:00 AM Medical Record Number: [id number] Patient Account Number: [id number] Date of Birth/Sex: 1981-02-04 (35 y.o. Male) Treating RN: Clover Mealy, RN, BSN, [employer] Primary Care Ayven Pheasant: PATIENT, NO Other Clinician: Referring Jakeb Lamping: Lorre Nick Treating Chene Kasinger/Extender: [place] in Treatment: 0 Primary Learner Assessed: Patient Learning Preferences/Education Level/Primary Language Learning Preference: Explanation Highest Education Level: College or Above Preferred Language: English Cognitive Barrier Assessment/Beliefs Language Barrier: No Physical Barrier Assessment Impaired Vision: No Impaired Hearing: No Decreased Hand dexterity: No Knowledge/Comprehension Assessment Knowledge Level: High Comprehension Level: High Ability to understand written High instructions: Ability to understand verbal High instructions: Motivation Assessment Anxiety Level: Calm Cooperation: Cooperative Education Importance: Acknowledges Need Interest in Health Problems: Asks Questions Perception: Coherent Willingness to Engage in Self- High Management Activities: Readiness to Engage in Self- High Management Activities: Electronic Signature(s) Signed: 01/12/2017 5:02:54 PM By: Elpidio Eric BSN, RN Entered By: Elpidio Eric on 01/12/2017 10:14:39 Todd Parrish ([id number]) -------------------------------------------------------------------------------- Fall Risk Assessment Details Patient Name: Todd Parrish Date of Service: 01/12/2017 9:00 AM Medical Record Number:  [id number] Patient Account Number: [id number] Date of Birth/Sex: Feb 21, 1981 (35 y.o. Male) Treating RN: Clover Mealy, RN, BSN, Psychologist, clinical Primary Care Coyt Govoni: PATIENT, NO Other Clinician: Referring Keena Dinse: Lorre Nick Treating Goodwin Kamph[place]/Extender: [place] in Treatment: 0 Fall Risk Assessment Items Have you had 2 or more falls in the last 12 monthso 0 No Have you had any fall that resulted in injury in the last 12 monthso 0 No FALL RISK ASSESSMENT: History of falling - immediate or within 3 months 0 No Secondary diagnosis 0 No Ambulatory aid None/bed rest/wheelchair/nurse 0 Yes Crutches/cane/walker 0 No Furniture 0 No  IV Access/Saline Lock 0 No Gait/Training Normal/bed rest/immobile 0 Yes Weak 0 No Impaired 0 No Mental Status Oriented to own ability 0 Yes Electronic Signature(s) Signed: 01/12/2017 5:02:54 PM By: Elpidio EricAfful, Rita BSN, RN Entered By: Elpidio EricAfful, Rita on 01/12/2017 10:15:01 Sconyers, Lenetta QuakerEVON J. ([id number]) -------------------------------------------------------------------------------- Foot Assessment Details Patient Name: Todd HeysREASEY, Todd J. Date of Service: 01/12/2017 9:00 AM Medical Record Number: [id number] Patient Account Number: [id number] Date of Birth/Sex: 28-Mar-1981 (35 y.o. Male) Treating RN: Clover MealyAfful, RN, BSN, Rita Primary Care Dajaun Goldring: PATIENT, NO Other Clinician: Referring Elisha Cooksey: Lorre NickALLEN, ANTHONY Treating Malaysha Arlen/Extender: Altamese CarolinaOBSON, MICHAEL G Weeks in Treatment: 0 Foot Assessment Items Site Locations + = Sensation present, - = Sensation absent, C = Callus, U = Ulcer R = Redness, W = Warmth, M = Maceration, PU = Pre-ulcerative lesion F = Fissure, S = Swelling, D = Dryness Assessment Right: Left: Other Deformity: No No Prior Foot Ulcer: No No Prior Amputation: No No Charcot Joint: No No Ambulatory Status: Ambulatory Without Help Gait: Steady Electronic Signature(s) Signed: 01/12/2017 5:02:54 PM By: Elpidio EricAfful, Rita BSN, RN Entered By: Elpidio EricAfful, Rita on  01/12/2017 10:16:26 Sandoz, Lenetta QuakerEVON J. ([id number]) -------------------------------------------------------------------------------- Nutrition Risk Assessment Details Patient Name: Todd HeysREASEY, Ruairi J. Date of Service: 01/12/2017 9:00 AM Medical Record Number: [id number] Patient Account Number: [id number] Date of Birth/Sex: 28-Mar-1981 (35 y.o. Male) Treating RN: Afful, RN, BSN, Rita Primary Care Charm Stenner: PATIENT, NO Other Clinician: Referring Treavor Blomquist: Lorre NickALLEN, ANTHONY Treating Alexandra Posadas/Extender: Maxwell CaulOBSON, MICHAEL G Weeks in Treatment: 0 Height (in): 66 Weight (lbs): 366 Body Mass Index (BMI): 59.1 Nutrition Risk Assessment Items NUTRITION RISK SCREEN: I have an illness or condition that made me change the kind and/or 0 No amount of food I eat I eat fewer than two meals per day 0 No I eat few fruits and vegetables, or milk products 0 No I have three or more drinks of beer, liquor or wine almost every day 0 No I have tooth or mouth problems that make it hard for me to eat 0 No I don't always have enough money to buy the food I need 0 No I eat alone most of the time 0 No I take three or more different prescribed or over-the-counter drugs a 0 No day Without wanting to, I have lost or gained 10 pounds in the last six 2 Yes months I am not always physically able to shop, cook and/or feed myself 0 No Nutrition Protocols Good Risk Protocol 0 No interventions needed Moderate Risk Protocol Electronic Signature(s) Signed: 01/12/2017 5:02:54 PM By: Elpidio EricAfful, Rita BSN, RN Entered By: Elpidio EricAfful, Rita on 01/12/2017 10:16:15

## 2017-01-13 NOTE — Progress Notes (Signed)
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 L97.211 Non-pressure chronic ulcer of right calf limited to breakdown of skin I89.0 Lymphedema, not elsewhere classified Facility Procedures CPT4: Description Modifier Quantity Code 24401027 99214 - WOUND CARE VISIT-LEV 4 EST PT 1 CPT4: 25366440 (Facility Use Only) [id number] - APPLY MULTLAY COMPRS LWR RT 1 LEG Physician Procedures CPT4: Description Modifier Quantity Code 5638756 99204 - WC PHYS LEVEL 4 - NEW PT 1 ICD-10 Description Diagnosis 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

## 2017-01-13 NOTE — Progress Notes (Addendum)
SOLOMON, SKOWRONEK ([id number]) Visit Report for 01/12/2017 Allergy List Details Patient Name: Todd Parrish, Todd Parrish Date of Service: 01/12/2017 9:00 AM Medical Record Number: [id number] Patient Account Number: [id number] Date of Birth/Sex: 1981-01-03 (35 y.o. Male) Treating RN: Todd Mealy, RN, BSN, [place] [facility] Primary Care Todd Parrish: PATIENT, NO Other Clinician: Referring Todd Parrish: Todd Parrish Treating Todd Parrish/Extender: Todd Parrish Weeks in Treatment: 0 Allergies Active Allergies morphine Allergy Notes Electronic Signature(s) Signed: 01/12/2017 5:02:54 PM By: Todd Parrish BSN, RN Entered By: Todd Parrish on 01/12/2017 10:13:41 Todd Parrish ([id number]) -------------------------------------------------------------------------------- Arrival Information Details Patient Name: Todd Parrish Date of Service: 01/12/2017 9:00 AM Medical Record Number: [id number] Patient Account Number: [id number] Date of Birth/Sex: March 14, 1981 (35 y.o. Male) Treating RN: Todd Mealy, RN, BSN, Todd Parrish Primary Care Todd Parrish: PATIENT, NO Other Clinician: Referring Todd Parrish: Todd Parrish Treating Todd Parrish/Extender: Todd Parrish [place] in Treatment: 0 Visit Information Patient Arrived: Ambulatory Arrival Time: 10:08 Accompanied By: friend in lobby Transfer Assistance: None Patient Identification Verified: Yes Secondary Verification Process Yes Completed: Patient Requires Transmission-Based No Precautions: Patient Has Alerts: Yes Patient Alerts: HIV Positive Electronic Signature(s) Signed: 02/08/2017 12:36:27 PM By: Todd Gurney, RN, BSN, Kim RN, BSN Previous Signature: 01/12/2017 5:02:54 PM Version By: Todd Parrish BSN, RN Entered By: Todd Gurney, RN, BSN, Todd Parrish on 01/29/2017 16:16:36 Parrish, Todd Parrish ([id number]) -------------------------------------------------------------------------------- Clinic Level of Care Assessment Details Patient Name: Todd Parrish Date of Service: 01/12/2017 9:00 AM Medical Record Number:  [id number] Patient Account Number: [id number] Date of Birth/Sex: 1981-06-13 (35 y.o. Male) Treating RN: Todd Mealy, RN, BSN, Todd Parrish Primary Care Todd Parrish: PATIENT, NO Other Clinician: Referring Quantia Grullon: Todd Parrish Treating Todd Parrish/Extender: Todd Parrish in Treatment: 0 Clinic Level of Care Assessment Items TOOL 2 Quantity Score []  - Use when only an EandM is performed on the INITIAL visit 0 ASSESSMENTS - Nursing Assessment / Reassessment X - General Physical Exam (combine w/ comprehensive assessment (listed just 1 20 below) when performed on new pt. evals) X - Comprehensive Assessment (HX, ROS, Risk Assessments, Wounds Hx, etc.) 1 25 ASSESSMENTS - Wound and Skin Assessment / Reassessment X - Simple Wound Assessment / Reassessment - one wound 1 5 []  - Complex Wound Assessment / Reassessment - multiple wounds 0 []  - Dermatologic / Skin Assessment (not related to wound area) 0 ASSESSMENTS - Ostomy and/or Continence Assessment and Care []  - Incontinence Assessment and Management 0 []  - Ostomy Care Assessment and Management (repouching, etc.) 0 PROCESS - Coordination of Care X - Simple Patient / Family Education for ongoing care 1 15 []  - Complex (extensive) Patient / Family Education for ongoing care 0 X - Staff obtains Chiropractor, Records, Test Results / Process Orders 1 10 []  - Staff telephones HHA, Nursing Homes / Clarify orders / etc 0 []  - Routine Transfer to another Facility (non-emergent condition) 0 []  - Routine Hospital Admission (non-emergent condition) 0 X - New Admissions / Manufacturing engineer / Ordering NPWT, Apligraf, etc. 1 15 []  - Emergency Hospital Admission (emergent condition) 0 []  - Simple Discharge Coordination 0 Todd Parrish. ([id number]) []  - Complex (extensive) Discharge Coordination 0 PROCESS - Special Needs []  - Pediatric / Minor Patient Management 0 []  - Isolation Patient Management 0 []  - Hearing / Language / Visual special needs 0 []  -  Assessment of Community assistance (transportation, D/C planning, etc.) 0 []  - Additional assistance / Altered mentation 0 []  - Support Surface(s) Assessment (bed, cushion, seat, etc.) 0 INTERVENTIONS - Wound Cleansing / Measurement X - Wound Imaging (photographs - any number of wounds) 1  5 []  - Wound Tracing (instead of photographs) 0 X - Simple Wound Measurement - one wound 1 5 []  - Complex Wound Measurement - multiple wounds 0 X - Simple Wound Cleansing - one wound 1 5 []  - Complex Wound Cleansing - multiple wounds 0 INTERVENTIONS - Wound Dressings X - Small Wound Dressing one or multiple wounds 1 10 []  - Medium Wound Dressing one or multiple wounds 0 []  - Large Wound Dressing one or multiple wounds 0 []  - Application of Medications - injection 0 INTERVENTIONS - Miscellaneous []  - External ear exam 0 []  - Specimen Collection (cultures, biopsies, blood, body fluids, etc.) 0 []  - Specimen(s) / Culture(s) sent or taken to Lab for analysis 0 []  - Patient Transfer (multiple staff / Nurse, adult / Similar devices) 0 []  - Simple Staple / Suture removal (25 or less) 0 []  - Complex Staple / Suture removal (26 or more) 0 Todd Parrish, Todd Parrish. ([id number]) []  - Hypo / Hyperglycemic Management (close monitor of Blood Glucose) 0 X - Ankle / Brachial Index (ABI) - do not check if billed separately 1 15 Has the patient been seen at the hospital within the last three years: Yes Total Score: 130 Level Of Care: New/Established - Level 4 Electronic Signature(s) Signed: 01/12/2017 5:02:54 PM By: Todd Parrish BSN, RN Entered By: Todd Parrish on 01/12/2017 11:45:15 Todd Parrish ([id number]) -------------------------------------------------------------------------------- Encounter Discharge Information Details Patient Name: Todd Parrish Date of Service: 01/12/2017 9:00 AM Medical Record Number: [id number] Patient Account Number: [id number] Date of Birth/Sex: 1981/05/12 (35 y.o. Male) Treating RN:  Todd Mealy, RN, BSN, Todd Parrish Primary Care Leightyn Cina: PATIENT, NO Other Clinician: Referring Edmar Blankenburg: Todd Parrish Treating Damari Hiltz/Extender: Todd Montcalm in Treatment: 0 Encounter Discharge Information Items Discharge Pain Level: 0 Discharge Condition: Stable Ambulatory Status: Ambulatory Discharge Destination: Home Transportation: Private Auto Accompanied By: friend in lobby Schedule Follow-up Appointment: No Medication Reconciliation completed and provided to Patient/Care No Shadae Reino: Provided on Clinical Summary of Care: 01/12/2017 Form Type Recipient Paper Patient DC Electronic Signature(s) Signed: 01/12/2017 5:02:54 PM By: Todd Parrish BSN, RN Previous Signature: 01/12/2017 11:49:24 AM Version By: Gwenlyn Perking Entered By: Todd Parrish on 01/12/2017 11:50:22 Fargnoli, Magnum Shela Parrish ([id number]) -------------------------------------------------------------------------------- Lower Extremity Assessment Details Patient Name: Todd Parrish Date of Service: 01/12/2017 9:00 AM Medical Record Number: [id number] Patient Account Number: [id number] Date of Birth/Sex: Apr 23, 1981 (35 y.o. Male) Treating RN: Afful, RN, BSN, Rita Primary Care Belisa Eichholz: PATIENT, NO Other Clinician: Referring Adrinne Sze: Todd Parrish Treating Ying Blankenhorn/Extender: Todd Parrish Weeks in Treatment: 0 Edema Assessment Assessed: [Left: No] [Right: No] Edema: [Left: Yes] [Right: Yes] Calf Left: Right: Point of Measurement: 35 cm From Medial Instep 50.5 cm 68 cm Ankle Left: Right: Point of Measurement: 5 cm From Medial Instep 34 cm 51 cm Vascular Assessment Claudication: Claudication Assessment [Left:None] [Right:None] Pulses: Dorsalis Pedis Palpable: [Left:Yes] [Right:Yes] Posterior Tibial Extremity colors, hair growth, and conditions: Extremity Color: [Left:Hyperpigmented] [Right:Hyperpigmented] Hair Growth on Extremity: [Left:No] [Right:No] Temperature of Extremity: [Left:Warm]  [Right:Warm] Capillary Refill: [Left:< 3 seconds] [Right:< 3 seconds] Blood Pressure: Brachial: [Right:139] Dorsalis Pedis: 115 [Left:Dorsalis Pedis: 119] Ankle: Posterior Tibial: [Left:Posterior Tibial: 0.83] [Right:0.86] Toe Nail Assessment Left: Right: Thick: Yes Yes Discolored: Yes Yes Deformed: Yes Yes Improper Length and Hygiene: Yes Yes Todd Parrish, Todd Parrish ([id number]) Electronic Signature(s) Signed: 01/12/2017 5:02:54 PM By: Todd Parrish BSN, RN Entered By: Todd Parrish on 01/12/2017 10:50:42 Fazekas, Todd Parrish ([id number]) -------------------------------------------------------------------------------- Multi Wound Chart Details Patient Name: Todd Parrish Date of Service: 01/12/2017 9:00 AM Medical  Record Number: [id number] Patient Account Number: [id number] Date of Birth/Sex: August 27, 1981 19(35 y.o. Male) Treating RN: Todd MealyAfful, RN, BSN, [facility] [employer] Primary Care Nataliah Hatlestad: PATIENT, NO Other Clinician: Referring Chicquita Mendel: Todd NickALLEN, ANTHONY Treating Monisha Siebel/Extender: Todd CaulOBSON, MICHAEL G Weeks in Treatment: 0 Vital Signs Height(in): 66 Pulse(bpm): 79 Weight(lbs): 366 Blood Pressure 139/85 (mmHg): Body Mass Index(BMI): 59 Temperature(F): 98.1 Respiratory Rate 16 (breaths/min): Photos: [1:No Photos] [N/A:N/A] Wound Location: [1:Right Lower Leg] [N/A:N/A] Wounding Event: [1:Gradually Appeared] [N/A:N/A] Primary Etiology: [1:Lymphedema] [N/A:N/A] Secondary Etiology: [1:Venous Leg Ulcer] [N/A:N/A] Date Acquired: [1:10/07/2016] [N/A:N/A] Weeks of Treatment: [1:0] [N/A:N/A] Wound Status: [1:Open] [N/A:N/A] Measurements L x W x D 11.5x12x0.1 [N/A:N/A] (cm) Area (cm) : [1:108.385] [N/A:N/A] Volume (cm) : [1:10.838] [N/A:N/A] Classification: [1:Partial Thickness] [N/A:N/A] Exudate Amount: [1:Small] [N/A:N/A] Exudate Type: [1:Serosanguineous] [N/A:N/A] Exudate Color: [1:red, brown] [N/A:N/A] Wound Margin: [1:Fibrotic scar, thickened scar] [N/A:N/A] Granulation Amount: [1:None  Present (0%)] [N/A:N/A] Necrotic Amount: [1:Large (67-100%)] [N/A:N/A] Necrotic Tissue: [1:Eschar] [N/A:N/A] Exposed Structures: [1:Fascia: No Fat Layer (Subcutaneous Tissue) Exposed: No Tendon: No Muscle: No Joint: No Bone: No Limited to Skin Breakdown] [N/A:N/A] Epithelialization: None N/A N/A Periwound Skin Texture: Induration: Yes N/A N/A Periwound Skin Dry/Scaly: Yes N/A N/A Moisture: Periwound Skin Color: Hemosiderin Staining: Yes N/A N/A Mottled: Yes Temperature: No Abnormality N/A N/A Tenderness on No N/A N/A Palpation: Wound Preparation: Ulcer Cleansing: N/A N/A Rinsed/Irrigated with Saline Topical Anesthetic Applied: Other: lidoacine 4% Treatment Notes Wound #1 (Right Lower Leg) 1. Cleansed with: Cleanse wound with antibacterial soap and water 3. Peri-wound Care: Moisturizing lotion Other peri-wound care (specify in notes) 4. Dressing Applied: Aquacel Ag 5. Secondary Dressing Applied ABD Pad 7. Secured with Tape 4-Layer Compression System - Right Lower Extremity Electronic Signature(s) Signed: 01/12/2017 4:44:13 PM By: Baltazar Najjarobson, Michael MD Entered By: Baltazar Najjarobson, Michael on 01/12/2017 12:04:22 Todd HeysREASEY, Todd Parrish. ([id number]) -------------------------------------------------------------------------------- Multi-Disciplinary Care Plan Details Patient Name: Todd HeysREASEY, Todd Parrish. Date of Service: 01/12/2017 9:00 AM Medical Record Number: [id number] Patient Account Number: [id number] Date of Birth/Sex: August 27, 1981 (35 y.o. Male) Treating RN: Todd MealyAfful, RN, BSN, [facility] [employer] Primary Care Kaamil Morefield: PATIENT, NO Other Clinician: Referring Dekota Kirlin: Todd NickALLEN, ANTHONY Treating Chinwe Lope/Extender: Todd CarolinaOBSON, MICHAEL G Weeks in Treatment: 0 Active Inactive Electronic Signature(s) Signed: 02/08/2017 12:36:27 PM By: Todd GurneyWoody, RN, BSN, Kim RN, BSN Signed: 04/20/2017 4:23:42 PM By: Todd EricAfful, Rita BSN, RN Previous Signature: 01/12/2017 5:02:54 PM Version By: Todd EricAfful, Rita BSN, RN Entered By: Todd GurneyWoody, RN, BSN, Todd Parrish on  01/29/2017 16:21:36 Todd HeysREASEY, Todd Parrish. ([id number]) -------------------------------------------------------------------------------- Pain Assessment Details Patient Name: Todd HeysREASEY, Todd Parrish. Date of Service: 01/12/2017 9:00 AM Medical Record Number: [id number] Patient Account Number: [id number] Date of Birth/Sex: August 27, 1981 (35 y.o. Male) Treating RN: Todd MealyAfful, RN, BSN, Rita Primary Care Oluwaseun Cremer: PATIENT, NO Other Clinician: Referring Geron Mulford: Todd NickALLEN, ANTHONY Treating Sullivan Jacuinde/Extender: Todd CaulOBSON, MICHAEL G Weeks in Treatment: 0 Active Problems Location of Pain Severity and Description of Pain Patient Has Paino No Site Locations With Dressing Change: No Pain Management and Medication Current Pain Management: Electronic Signature(s) Signed: 01/12/2017 5:02:54 PM By: Todd EricAfful, Rita BSN, RN Entered By: Todd EricAfful, Rita on 01/12/2017 10:12:11 Todd HeysREASEY, Todd Parrish. ([id number]) -------------------------------------------------------------------------------- Patient/Caregiver Education Details Patient Name: Todd HeysREASEY, Todd Parrish. Date of Service: 01/12/2017 9:00 AM Medical Record Number: [id number] Patient Account Number: [id number] Date of Birth/Gender: August 27, 1981 (35 y.o. Male) Treating RN: Todd MealyAfful, RN, BSN, Rita Primary Care Physician: PATIENT, NO Other Clinician: Referring Physician: Lorre NickALLEN, ANTHONY Treating Physician/Extender: Todd CarolinaOBSON, MICHAEL G Weeks in Treatment: 0 Education Assessment Education Provided To: Patient Education Topics Provided Basic Hygiene: Methods: Explain/Verbal Responses: State content correctly Safety: Methods: Explain/Verbal Responses: State content correctly Venous: Methods:  Explain/Verbal Responses: State content correctly [facility]: Methods: Explain/Verbal Responses: State content correctly Wound/Skin Impairment: Methods: Explain/Verbal Responses: State content correctly Electronic Signature(s) Signed: 01/12/2017 5:02:54 PM By: Todd Parrish BSN,  RN Entered By: Todd Parrish on 01/12/2017 11:50:45 Todd Parrish, Todd Parrish ([id number]) -------------------------------------------------------------------------------- Wound Assessment Details Patient Name: Todd Parrish Date of Service: 01/12/2017 9:00 AM Medical Record Number: [id number] Patient Account Number: [id number] Date of Birth/Sex: [birth year] (35 y.o. Male) Treating RN: Todd Mealy, RN, BSN, Todd Parrish Primary Care Leone Mobley: PATIENT, NO Other Clinician: Referring Ellasyn Swilling: Todd Parrish Treating Misao Fackrell/Extender: Todd Parrish Weeks in Treatment: 0 Wound Status Wound Number: 1 Primary Etiology: Lymphedema Wound Location: Right Lower Leg Secondary Etiology: Venous Leg Ulcer Wounding Event: Gradually Appeared Wound Status: Open Date Acquired: 10/07/2016 Weeks Of Treatment: 0 Clustered Wound: No Photos Photo Uploaded By: Todd Parrish on 01/12/2017 15:43:57 Wound Measurements Length: (cm) 11.5 Width: (cm) 12 Depth: (cm) 0.1 Area: (cm) 108.385 Volume: (cm) 10.838 % Reduction in Area: % Reduction in Volume: Epithelialization: None Tunneling: No Undermining: No Wound Description Classification: Partial Thickness Wound Margin: Fibrotic scar, thickened scar Exudate Amount: Small Exudate Type: Serosanguineous Exudate Color: red, brown Foul Odor After Cleansing: No Slough/Fibrino No Wound Bed Granulation Amount: None Present (0%) Exposed Structure Necrotic Amount: Large (67-100%) Fascia Exposed: No Necrotic Quality: Eschar Fat Layer (Subcutaneous Tissue) Exposed: No Tendon Exposed: No Todd Parrish, Todd Parrish. ([id number]) Muscle Exposed: No Joint Exposed: No Bone Exposed: No Limited to Skin Breakdown Periwound Skin Texture Texture Color No Abnormalities Noted: No No Abnormalities Noted: No Induration: Yes Hemosiderin Staining: Yes Mottled: Yes Moisture No Abnormalities Noted: No Temperature / Pain Dry / Scaly: Yes Temperature: No Abnormality Wound  Preparation Ulcer Cleansing: Rinsed/Irrigated with Saline Topical Anesthetic Applied: Other: lidoacine 4%, Electronic Signature(s) Signed: 01/12/2017 5:02:54 PM By: Todd Parrish BSN, RN Entered By: Todd Parrish on 01/12/2017 10:32:57 Todd Parrish ([id number]) -------------------------------------------------------------------------------- Vitals Details Patient Name: Todd Parrish Date of Service: 01/12/2017 9:00 AM Medical Record Number: [id number] Patient Account Number: [id number] Date of Birth/Sex: 1981-08-12 (35 y.o. Male) Treating RN: Afful, RN, BSN, Rita Primary Care Derriana Oser: PATIENT, NO Other Clinician: Referring Suyash Amory: Todd Parrish Treating Aylen Stradford/Extender: Todd Sebastian in Treatment: 0 Vital Signs Time Taken: 10:12 Temperature (F): 98.1 Height (in): 66 Pulse (bpm): 79 Source: Stated Respiratory Rate (breaths/min): 16 Weight (lbs): 366 Blood Pressure (mmHg): 139/85 Source: Measured Reference Range: 80 - 120 mg / dl Body Mass Index (BMI): 59.1 Electronic Signature(s) Signed: 01/12/2017 5:02:54 PM By: Todd Parrish BSN, RN Entered By: Todd Parrish on 01/12/2017 10:12:58

## 2017-01-19 ENCOUNTER — Ambulatory Visit: Payer: Medicaid Other | Admitting: Internal Medicine

## 2017-01-19 ENCOUNTER — Telehealth (HOSPITAL_COMMUNITY): Payer: Self-pay

## 2017-01-19 NOTE — Telephone Encounter (Signed)
Called to schedule ivc filter removal, pt wants to wait until his cellulitis is better, will call to schedule later in February. AW

## 2017-01-21 DIAGNOSIS — L03119 Cellulitis of unspecified part of limb: Secondary | ICD-10-CM

## 2017-01-21 DIAGNOSIS — L02419 Cutaneous abscess of limb, unspecified: Secondary | ICD-10-CM

## 2017-01-21 HISTORY — DX: Cellulitis of unspecified part of limb: L03.119

## 2017-01-21 HISTORY — DX: Cutaneous abscess of limb, unspecified: L02.419

## 2017-01-25 ENCOUNTER — Emergency Department (HOSPITAL_COMMUNITY): Payer: Medicaid Other

## 2017-01-25 ENCOUNTER — Observation Stay (HOSPITAL_COMMUNITY): Payer: Medicaid Other

## 2017-01-25 ENCOUNTER — Inpatient Hospital Stay (HOSPITAL_COMMUNITY)
Admission: EM | Admit: 2017-01-25 | Discharge: 2017-01-29 | DRG: 602 | Disposition: A | Discharge status: Home / Self Care | Payer: Medicaid Other | Attending: Internal Medicine | Admitting: Internal Medicine

## 2017-01-25 ENCOUNTER — Observation Stay (HOSPITAL_BASED_OUTPATIENT_CLINIC_OR_DEPARTMENT_OTHER): Payer: Medicaid Other

## 2017-01-25 ENCOUNTER — Encounter (HOSPITAL_COMMUNITY): Payer: Self-pay | Admitting: Emergency Medicine

## 2017-01-25 DIAGNOSIS — Z885 Allergy status to narcotic agent status: Secondary | ICD-10-CM | POA: Diagnosis not present

## 2017-01-25 DIAGNOSIS — Z79899 Other long term (current) drug therapy: Secondary | ICD-10-CM

## 2017-01-25 DIAGNOSIS — Z8781 Personal history of (healed) traumatic fracture: Secondary | ICD-10-CM

## 2017-01-25 DIAGNOSIS — Z8674 Personal history of sudden cardiac arrest: Secondary | ICD-10-CM

## 2017-01-25 DIAGNOSIS — M25569 Pain in unspecified knee: Secondary | ICD-10-CM

## 2017-01-25 DIAGNOSIS — D638 Anemia in other chronic diseases classified elsewhere: Secondary | ICD-10-CM | POA: Diagnosis present

## 2017-01-25 DIAGNOSIS — M1731 Unilateral post-traumatic osteoarthritis, right knee: Secondary | ICD-10-CM | POA: Diagnosis present

## 2017-01-25 DIAGNOSIS — Z21 Asymptomatic human immunodeficiency virus [HIV] infection status: Secondary | ICD-10-CM

## 2017-01-25 DIAGNOSIS — L03115 Cellulitis of right lower limb: Principal | ICD-10-CM | POA: Diagnosis present

## 2017-01-25 DIAGNOSIS — I890 Lymphedema, not elsewhere classified: Secondary | ICD-10-CM

## 2017-01-25 DIAGNOSIS — M7989 Other specified soft tissue disorders: Secondary | ICD-10-CM

## 2017-01-25 DIAGNOSIS — L0390 Cellulitis, unspecified: Secondary | ICD-10-CM | POA: Insufficient documentation

## 2017-01-25 DIAGNOSIS — Z86711 Personal history of pulmonary embolism: Secondary | ICD-10-CM | POA: Diagnosis not present

## 2017-01-25 DIAGNOSIS — Z95828 Presence of other vascular implants and grafts: Secondary | ICD-10-CM

## 2017-01-25 DIAGNOSIS — Z6841 Body Mass Index (BMI) 40.0 and over, adult: Secondary | ICD-10-CM | POA: Diagnosis not present

## 2017-01-25 DIAGNOSIS — Z91013 Allergy to seafood: Secondary | ICD-10-CM

## 2017-01-25 DIAGNOSIS — B9689 Other specified bacterial agents as the cause of diseases classified elsewhere: Secondary | ICD-10-CM

## 2017-01-25 DIAGNOSIS — K219 Gastro-esophageal reflux disease without esophagitis: Secondary | ICD-10-CM | POA: Diagnosis present

## 2017-01-25 DIAGNOSIS — D696 Thrombocytopenia, unspecified: Secondary | ICD-10-CM | POA: Diagnosis present

## 2017-01-25 DIAGNOSIS — M79609 Pain in unspecified limb: Secondary | ICD-10-CM | POA: Diagnosis not present

## 2017-01-25 DIAGNOSIS — E871 Hypo-osmolality and hyponatremia: Secondary | ICD-10-CM | POA: Diagnosis present

## 2017-01-25 DIAGNOSIS — Z96698 Presence of other orthopedic joint implants: Secondary | ICD-10-CM

## 2017-01-25 DIAGNOSIS — B20 Human immunodeficiency virus [HIV] disease: Secondary | ICD-10-CM | POA: Diagnosis present

## 2017-01-25 DIAGNOSIS — Z86718 Personal history of other venous thrombosis and embolism: Secondary | ICD-10-CM | POA: Diagnosis not present

## 2017-01-25 DIAGNOSIS — D649 Anemia, unspecified: Secondary | ICD-10-CM | POA: Diagnosis present

## 2017-01-25 DIAGNOSIS — Z91018 Allergy to other foods: Secondary | ICD-10-CM

## 2017-01-25 DIAGNOSIS — M25461 Effusion, right knee: Secondary | ICD-10-CM

## 2017-01-25 DIAGNOSIS — L03119 Cellulitis of unspecified part of limb: Secondary | ICD-10-CM | POA: Diagnosis present

## 2017-01-25 HISTORY — DX: Cellulitis of unspecified part of limb: L03.119

## 2017-01-25 HISTORY — DX: Cutaneous abscess of limb, unspecified: L02.419

## 2017-01-25 LAB — T-HELPER CELLS (CD4) COUNT (NOT AT ARMC)
CD4 % Helper T Cell: 28 % — ABNORMAL LOW (ref 33–55)
CD4 T Cell Abs: 360 /uL — ABNORMAL LOW (ref 400–2700)

## 2017-01-25 LAB — BASIC METABOLIC PANEL
ANION GAP: 8 (ref 5–15)
BUN: 12 mg/dL (ref 6–20)
CO2: 24 mmol/L (ref 22–32)
Calcium: 8.7 mg/dL — ABNORMAL LOW (ref 8.9–10.3)
Chloride: 104 mmol/L (ref 101–111)
Creatinine, Ser: 0.86 mg/dL (ref 0.61–1.24)
GLUCOSE: 114 mg/dL — AB (ref 65–99)
POTASSIUM: 4 mmol/L (ref 3.5–5.1)
Sodium: 136 mmol/L (ref 135–145)

## 2017-01-25 LAB — CBC WITH DIFFERENTIAL/PLATELET
Basophils Absolute: 0 10*3/uL (ref 0.0–0.1)
Basophils Relative: 0 %
EOS PCT: 1 %
Eosinophils Absolute: 0.1 10*3/uL (ref 0.0–0.7)
HCT: 33.5 % — ABNORMAL LOW (ref 39.0–52.0)
Hemoglobin: 10.4 g/dL — ABNORMAL LOW (ref 13.0–17.0)
LYMPHS PCT: 17 %
Lymphs Abs: 1.4 10*3/uL (ref 0.7–4.0)
MCH: 24.4 pg — ABNORMAL LOW (ref 26.0–34.0)
MCHC: 31 g/dL (ref 30.0–36.0)
MCV: 78.5 fL (ref 78.0–100.0)
Monocytes Absolute: 0.9 10*3/uL (ref 0.1–1.0)
Monocytes Relative: 11 %
NEUTROS ABS: 5.8 10*3/uL (ref 1.7–7.7)
Neutrophils Relative %: 70 %
PLATELETS: 123 10*3/uL — AB (ref 150–400)
RBC: 4.27 MIL/uL (ref 4.22–5.81)
RDW: 13.9 % (ref 11.5–15.5)
WBC: 8.3 10*3/uL (ref 4.0–10.5)

## 2017-01-25 LAB — I-STAT CG4 LACTIC ACID, ED: Lactic Acid, Venous: 1.29 mmol/L (ref 0.5–1.9)

## 2017-01-25 MED ORDER — OXYCODONE-ACETAMINOPHEN 5-325 MG PO TABS
1.0000 | ORAL_TABLET | Freq: Four times a day (QID) | ORAL | Status: DC | PRN
  Administered 2017-01-25 (×3): 2 via ORAL
  Filled 2017-01-25 (×3): qty 2

## 2017-01-25 MED ORDER — CEFAZOLIN SODIUM-DEXTROSE 2-4 GM/100ML-% IV SOLN
2.0000 g | Freq: Three times a day (TID) | INTRAVENOUS | Status: DC
  Administered 2017-01-25 – 2017-01-27 (×6): 2 g via INTRAVENOUS
  Filled 2017-01-25 (×9): qty 100

## 2017-01-25 MED ORDER — ACETAMINOPHEN 325 MG PO TABS
325.0000 mg | ORAL_TABLET | Freq: Four times a day (QID) | ORAL | Status: DC | PRN
Start: 2017-01-25 — End: 2017-01-25

## 2017-01-25 MED ORDER — FENTANYL CITRATE (PF) 100 MCG/2ML IJ SOLN
50.0000 ug | Freq: Once | INTRAMUSCULAR | Status: AC
  Administered 2017-01-25: 50 ug via INTRAVENOUS
  Filled 2017-01-25: qty 2

## 2017-01-25 MED ORDER — IBUPROFEN 400 MG PO TABS
400.0000 mg | ORAL_TABLET | Freq: Four times a day (QID) | ORAL | Status: DC | PRN
  Administered 2017-01-25 – 2017-01-29 (×5): 400 mg via ORAL
  Filled 2017-01-25 (×5): qty 1

## 2017-01-25 MED ORDER — ABACAVIR-DOLUTEGRAVIR-LAMIVUD 600-50-300 MG PO TABS
1.0000 | ORAL_TABLET | Freq: Every day | ORAL | Status: DC
  Administered 2017-01-26 – 2017-01-29 (×4): 1 via ORAL
  Filled 2017-01-25 (×4): qty 1

## 2017-01-25 MED ORDER — ONDANSETRON HCL 4 MG/2ML IJ SOLN: 4.0000 mg | Freq: Four times a day (QID) | INTRAMUSCULAR | Status: DC | PRN

## 2017-01-25 MED ORDER — OXYCODONE-ACETAMINOPHEN 5-325 MG PO TABS
2.0000 | ORAL_TABLET | Freq: Once | ORAL | Status: AC
  Administered 2017-01-25: 2 via ORAL
  Filled 2017-01-25: qty 2

## 2017-01-25 MED ORDER — ONDANSETRON HCL 4 MG/2ML IJ SOLN
4.0000 mg | Freq: Once | INTRAMUSCULAR | Status: AC
  Administered 2017-01-25: 4 mg via INTRAVENOUS
  Filled 2017-01-25: qty 2

## 2017-01-25 MED ORDER — ONDANSETRON HCL 4 MG PO TABS: 4.0000 mg | ORAL_TABLET | Freq: Four times a day (QID) | ORAL | Status: DC | PRN

## 2017-01-25 MED ORDER — VANCOMYCIN HCL 10 G IV SOLR
1250.0000 mg | Freq: Three times a day (TID) | INTRAVENOUS | Status: DC
  Administered 2017-01-25 (×2): 1250 mg via INTRAVENOUS
  Filled 2017-01-25 (×3): qty 1250

## 2017-01-25 MED ORDER — VANCOMYCIN HCL IN DEXTROSE 1-5 GM/200ML-% IV SOLN
1000.0000 mg | Freq: Once | INTRAVENOUS | Status: AC
  Administered 2017-01-25: 1000 mg via INTRAVENOUS
  Filled 2017-01-25: qty 200

## 2017-01-25 MED ORDER — ENOXAPARIN SODIUM 40 MG/0.4ML ~~LOC~~ SOLN
40.0000 mg | SUBCUTANEOUS | Status: DC
  Administered 2017-01-25 – 2017-01-28 (×4): 40 mg via SUBCUTANEOUS
  Filled 2017-01-25 (×5): qty 0.4

## 2017-01-25 MED ORDER — ACETAMINOPHEN 325 MG PO TABS
325.0000 mg | ORAL_TABLET | Freq: Four times a day (QID) | ORAL | Status: DC | PRN
  Administered 2017-01-26: 325 mg via ORAL
  Filled 2017-01-25: qty 1

## 2017-01-25 MED ORDER — HYDROMORPHONE HCL 2 MG/ML IJ SOLN
0.5000 mg | Freq: Once | INTRAMUSCULAR | Status: AC
  Administered 2017-01-25: 0.5 mg via INTRAVENOUS
  Filled 2017-01-25: qty 1

## 2017-01-25 MED ORDER — IOPAMIDOL (ISOVUE-300) INJECTION 61%
INTRAVENOUS | Status: AC
  Administered 2017-01-25: 100 mL
  Filled 2017-01-25: qty 100

## 2017-01-25 NOTE — Progress Notes (Signed)
Pharmacy Antibiotic Note  Todd StackDevon [name] is a 36 y.o. HIV+  male admitted on 01/25/2017 with RLE cellulitis.  Pharmacy has been consulted for ancef dosing.  Patient had been on vancomycin.  Plan: Ancef 2g IV q8h Monitor culture data, renal function and clinical course  Height: 5\' 7"  (170.2 cm) Weight: (!) 357 lb 5.9 oz (162.1 kg) IBW/kg (Calculated) : 66.1  Temp (24hrs), Avg:98.6 F (37 C), Min:98.2 F (36.8 C), Max:99.1 F (37.3 C)   Recent Labs Lab 01/25/17 0051 01/25/17 0117  WBC 8.3  --   CREATININE 0.86  --   LATICACIDVEN  --  1.29    Estimated Creatinine Clearance: 173.8 mL/min (by C-G formula based on SCr of 0.86 mg/dL).    Allergies  Allergen Reactions  . Morphine And Related Nausea And Vomiting and Other (See Comments)    Elevated blood pressure  . Pork-Derived Products Other (See Comments)    - doesn't eat pork  . Shrimp [Shellfish Allergy] Other (See Comments)    -doesn't eat shrimp religious puroses    Arlean HoppingCorey M. Newman PiesBall, PharmD, BCPS Clinical Pharmacist [phone] 01/25/2017 3:32 PM

## 2017-01-25 NOTE — Progress Notes (Signed)
Patient admitted after midnight-- please see H&P. LE swelling (lymphedema) and ? Cellulitis-- on vanc, was wearing Unna boot-- follows at wound care in  -duplex negative for DVT HIV diagnosis (2016)- has not been started on meds due to denial that he has condition. ID consult  Wound care consult in AM for ? Unna boot replacement  Marlin CanaryJessica Joelyn Lover DO

## 2017-01-25 NOTE — Progress Notes (Signed)
Pharmacy Antibiotic Note  Todd StackDevon [name] is a 36 y.o. HIV+  male admitted on 01/25/2017 with RLE cellulitis.  Pharmacy has been consulted for Vancomycin dosing.  Vancomycin 1 g IV given in ED at  0130  Plan: Vancomycin 1250 mg IV q8h  Height: 5\' 7"  (170.2 cm) Weight: (!) 354 lb (160.6 kg) IBW/kg (Calculated) : 66.1  Temp (24hrs), Avg:99.1 F (37.3 C), Min:99.1 F (37.3 C), Max:99.1 F (37.3 C)   Recent Labs Lab 01/25/17 0051 01/25/17 0117  WBC 8.3  --   CREATININE 0.86  --   LATICACIDVEN  --  1.29    Estimated Creatinine Clearance: 172.8 mL/min (by C-G formula based on SCr of 0.86 mg/dL).    Allergies  Allergen Reactions  . Morphine And Related Nausea And Vomiting and Other (See Comments)    Elevated blood pressure  . Pork-Derived Products Other (See Comments)    - doesn't eat pork  . Shrimp [Shellfish Allergy] Other (See Comments)    -doesn't eat shrimp religious puroses    Eddie Candlebbott, Rickardo Brinegar Vernon 01/25/2017 6:32 AM

## 2017-01-25 NOTE — ED Notes (Signed)
Patient transported to X-ray 
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
## 2017-01-25 NOTE — ED Notes (Signed)
Attempted to ambulate pt, pt unable to bear weight on leg at this time. EDP aware.

## 2017-01-25 NOTE — Progress Notes (Signed)
VASCULAR LAB PRELIMINARY  PRELIMINARY  PRELIMINARY  PRELIMINARY  Right lower extremity venous DVT completed.    Preliminary report:  There is no obvious evidence of DVT or SVT noted in the visualized veins of the right lower extremity.   Lusia Greis, RVT 01/25/2017, 9:55 AM
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
## 2017-01-25 NOTE — Progress Notes (Signed)
Pt admitted from ED with right lower leg cellulitis, alert and oriented, right AC IV saline lock, no complain of pain at this time.

## 2017-01-25 NOTE — ED Triage Notes (Signed)
Per EMS, pt from home with c/o right leg pain and swelling. Pt recently treated for cellulitis to the right leg, recently removed his pressure wrap. Hx of right tibia fx with surgery, and blood clot. BP-150/80, HR-90
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
## 2017-01-26 ENCOUNTER — Encounter (HOSPITAL_COMMUNITY): Payer: Self-pay | Admitting: General Practice

## 2017-01-26 ENCOUNTER — Telehealth: Payer: Self-pay | Admitting: Infectious Disease

## 2017-01-26 DIAGNOSIS — I890 Lymphedema, not elsewhere classified: Secondary | ICD-10-CM

## 2017-01-26 DIAGNOSIS — M25461 Effusion, right knee: Secondary | ICD-10-CM

## 2017-01-26 MED ORDER — ABACAVIR-DOLUTEGRAVIR-LAMIVUD 600-50-300 MG PO TABS: 1.0000 | ORAL_TABLET | Freq: Every day | ORAL | 2 refills | Status: DC

## 2017-01-26 MED ORDER — DIPHENHYDRAMINE HCL 25 MG PO CAPS
50.0000 mg | ORAL_CAPSULE | Freq: Once | ORAL | Status: AC
  Administered 2017-01-26: 50 mg via ORAL
  Filled 2017-01-26: qty 2

## 2017-01-26 MED ORDER — HYDROCODONE-ACETAMINOPHEN 5-325 MG PO TABS
1.0000 | ORAL_TABLET | Freq: Four times a day (QID) | ORAL | Status: DC | PRN
  Administered 2017-01-26 – 2017-01-29 (×12): 2 via ORAL
  Filled 2017-01-26 (×12): qty 2

## 2017-01-26 MED ORDER — DIPHENHYDRAMINE HCL 25 MG PO CAPS: 25.0000 mg | ORAL_CAPSULE | ORAL | Status: DC | PRN

## 2017-01-26 MED ORDER — ACETAMINOPHEN 325 MG PO TABS
650.0000 mg | ORAL_TABLET | Freq: Four times a day (QID) | ORAL | Status: DC | PRN
  Filled 2017-01-26: qty 2
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
## 2017-01-26 NOTE — Progress Notes (Signed)
[name] made aware about the specimen they draw for synovial cell cnt, and was sent to lab is clotted.

## 2017-01-26 NOTE — Progress Notes (Signed)
Subjective:  Still complaining of knee pain, had some mild nausea after taking TRIUMEQ   Antibiotics:  Anti-infectives    Start     Dose/Rate Route Frequency Ordered Stop   01/26/17 1000  abacavir-dolutegravir-lamiVUDine (TRIUMEQ) 600-50-300 MG per tablet 1 tablet     1 tablet Oral Daily 01/25/17 1526     01/25/17 1600  ceFAZolin (ANCEF) IVPB 2g/100 mL premix     2 g 200 mL/hr over 30 Minutes Intravenous Every 8 hours 01/25/17 1536     01/25/17 0700  vancomycin (VANCOCIN) 1,250 mg in sodium chloride 0.9 % 250 mL IVPB  Status:  Discontinued     1,250 mg 166.7 mL/hr over 90 Minutes Intravenous Every 8 hours 01/25/17 0636 01/25/17 1523   01/25/17 0100  vancomycin (VANCOCIN) IVPB 1000 mg/200 mL premix     1,000 mg 200 mL/hr over 60 Minutes Intravenous  Once 01/25/17 0053 01/25/17 0254      Medications: Scheduled Meds: . abacavir-dolutegravir-lamiVUDine  1 tablet Oral Daily  .  ceFAZolin (ANCEF) IV  2 g Intravenous Q8H  . enoxaparin (LOVENOX) injection  40 mg Subcutaneous Q24H   Continuous Infusions: PRN Meds:.acetaminophen, diphenhydrAMINE, HYDROcodone-acetaminophen, ibuprofen, ondansetron **OR** ondansetron (ZOFRAN) IV    Objective: Weight change:   Intake/Output Summary (Last 24 hours) at 01/26/17 1724 Last data filed at 01/26/17 1628  Gross per 24 hour  Intake             1620 ml  Output             1725 ml  Net             -105 ml   Blood pressure (!) 148/88, pulse (!) 102, temperature 98.2 F (36.8 C), temperature source Oral, resp. rate 14, height 5\' 7"  (1.702 m), weight (!) 357 lb 5.9 oz (162.1 kg), SpO2 99 %. Temp:  [97.7 F (36.5 C)-100.2 F (37.9 C)] 98.2 F (36.8 C) (02/06 1409) Pulse Rate:  [90-102] 102 (02/06 1409) Resp:  [14-18] 14 (02/06 1409) BP: (141-154)/(70-88) 148/88 (02/06 1409) SpO2:  [97 %-100 %] 99 % (02/06 1409)  Physical Exam: General: Alert and awake, oriented x3, not in any acute distress. HEENT: anicteric sclera, pupils  reactive to light and accommodation, EOMI CVS regular rate, normal r,  no murmur rubs or gallops Chest: clear to auscultation bilaterally, no wheezing, rales or rhonchi Abdomen: soft nontender, nondistended, normal bowel sounds, Extremities:tenderness medially  Skin:   01/25/17: Right lymphedema with cellulitic changes superimposed      01/26/17:      Neuro: nonfocal  CBC: CBC Latest Ref Rng & Units 01/25/2017 02/20/2016 02/18/2016  WBC 4.0 - 10.5 K/uL 8.3 6.4 19.2(H)  Hemoglobin 13.0 - 17.0 g/dL 10.4(L) 11.2(L) 9.2(L)  Hematocrit 39.0 - 52.0 % 33.5(L) 35.7(L) 29.8(L)  Platelets 150 - 400 K/uL 123(L) PLATELET CLUMPS NOTED ON SMEAR, UNABLE TO ESTIMATE 285      BMET  Recent Labs  01/25/17 0051  NA 136  K 4.0  CL 104  CO2 24  GLUCOSE 114*  BUN 12  CREATININE 0.86  CALCIUM 8.7*     Liver Panel  No results for input(s): PROT, ALBUMIN, AST, ALT, ALKPHOS, BILITOT, BILIDIR, IBILI in the last 72 hours.     Sedimentation Rate No results for input(s): ESRSEDRATE in the last 72 hours. C-Reactive Protein No results for input(s): CRP in the last 72 hours.  Micro Results: Recent Results (from the past 720 hour(s))  Culture, blood (  routine x 2)     Status: None (Preliminary result)   Collection Time: 01/25/17  7:07 AM  Result Value Ref Range Status   Specimen Description BLOOD LEFT ANTECUBITAL  Final   Special Requests IN PEDIATRIC BOTTLE 2CC  Final   Culture NO GROWTH 1 DAY  Final   Report Status PENDING  Incomplete  Culture, blood (routine x 2)     Status: None (Preliminary result)   Collection Time: 01/25/17  7:10 AM  Result Value Ref Range Status   Specimen Description BLOOD LEFT ARM  Final   Special Requests IN PEDIATRIC BOTTLE 2CC  Final   Culture NO GROWTH 1 DAY  Final   Report Status PENDING  Incomplete  Body fluid culture     Status: None (Preliminary result)   Collection Time: 01/26/17 12:17 PM  Result Value Ref Range Status   Specimen Description  SYNOVIAL RIGHT KNEE  Final   Special Requests Immunocompromised  Final   Gram Stain   Final    RARE WBC PRESENT,BOTH PMN AND MONONUCLEAR NO ORGANISMS SEEN    Culture PENDING  Incomplete   Report Status PENDING  Incomplete    Studies/Results: Dg Tibia/fibula Right  Result Date: 01/25/2017 CLINICAL DATA:  Right lower leg pain and swelling today.  No injury. EXAM: RIGHT TIBIA AND FIBULA - 2 VIEW COMPARISON:  12/10/2015 FINDINGS: Postoperative changes with lateral plate and screw fixation of the tibial metaphysis. Old pin tracts in the proximal tibial shaft. Healed fracture lines are present. Mild residual deformity of the tibial plateaus consistent with old fracture deformity. No evidence of acute displaced fracture. No focal bone lesion or bone destruction. There is diffuse soft tissue infiltration demonstrated throughout the soft tissues of the right lower leg. This may indicate edema, cellulitis, or other infiltrating process. No soft tissue gas or soft tissue foreign bodies are demonstrated. IMPRESSION: Postoperative internal fixation of the and healed fracture deformity of the proximal right tibia. No acute fractures. Diffuse infiltration or edema in the soft tissues of the right lower leg. Electronically Signed   By: Burman Nieves M.D.   On: 01/25/2017 01:32   Ct Knee Right W Contrast  Result Date: 01/25/2017 CLINICAL DATA:  Superior right knee joint pain. History of cellulitis of the tibia and fibula. EXAM: CT OF THE LOWER RIGHT EXTREMITY WITH CONTRAST, CT OF THE RIGHT KNEE WITH CONTRAST TECHNIQUE: Multidetector CT imaging of the lower right extremity was performed according to the standard protocol following intravenous contrast administration. COMPARISON:  None. CONTRAST:  ISOVUE-300 IOPAMIDOL (ISOVUE-300) INJECTION 61% FINDINGS: Bones/Joint/Cartilage Lateral plate and screw fixation hardware is noted with elevation of previously noted lateral and medial tibial plateau fractures and  demonstrated partial osseous union of the tibial plateau fracture fragments. No new fracture bone destruction is noted. No hardware failure or loosening noted. No acute fracture lucencies are identified. Ligaments Suboptimally assessed by CT. Muscles and Tendons No mild muscle atrophy nor intramuscular hematoma. Intact extensor mechanism tendons. Soft tissues Moderate to large joint effusion without lipohemarthrosis. Cellulitic soft tissue induration about the knee more so antral laterally but also medially further caudad from the knee joint. No focal fluid collection. IMPRESSION: 1. Periarticular soft tissue induration consistent with cellulitis with moderate to large joint effusion. 2. ORIF of the proximal tibia with elevation of previously noted comminuted tibial plateau fractures. Some residual areas of incomplete osseous union noted of the tibial plateau not unexpected given the degree of comminution seen previously. No new fracture is identified nor  findings strongly suspicious for osteomyelitis. Electronically Signed   By: Tollie Ethavid  Kwon M.D.   On: 01/25/2017 19:14   Ct Tibia Fibula Right W Contrast  Result Date: 01/25/2017 CLINICAL DATA:  Superior right knee joint pain. History of cellulitis of the tibia and fibula. EXAM: CT OF THE LOWER RIGHT EXTREMITY WITH CONTRAST, CT OF THE RIGHT KNEE WITH CONTRAST TECHNIQUE: Multidetector CT imaging of the lower right extremity was performed according to the standard protocol following intravenous contrast administration. COMPARISON:  None. CONTRAST:  100mL ISOVUE-300 IOPAMIDOL (ISOVUE-300) INJECTION 61% FINDINGS: Bones/Joint/Cartilage Lateral plate and screw fixation hardware is noted with elevation of previously noted lateral and medial tibial plateau fractures and demonstrated partial osseous union of the tibial plateau fracture fragments. No new fracture bone destruction is noted. No hardware failure or loosening noted. No acute fracture lucencies are identified.  Ligaments Suboptimally assessed by CT. Muscles and Tendons No mild muscle atrophy nor intramuscular hematoma. Intact extensor mechanism tendons. Soft tissues Moderate to large joint effusion without lipohemarthrosis. Cellulitic soft tissue induration about the knee more so antral laterally but also medially further caudad from the knee joint. No focal fluid collection. IMPRESSION: 1. Periarticular soft tissue induration consistent with cellulitis with moderate to large joint effusion. 2. ORIF of the proximal tibia with elevation of previously noted comminuted tibial plateau fractures. Some residual areas of incomplete osseous union noted of the tibial plateau not unexpected given the degree of comminution seen previously. No new fracture is identified nor findings strongly suspicious for osteomyelitis. Electronically Signed   By: Tollie Ethavid  Kwon M.D.   On: 01/25/2017 19:14      Assessment/Plan:  INTERVAL HISTORY:  Knee effusion discovered on CT of the leg aspirate was small amount and  was bloody and not sent for cell count but Gram stain was negative   Principal Problem:   Cellulitis of right lower extremity Active Problems:   HIV disease (HCC)   Anemia   Personal history of DVT (deep vein thrombosis)   History of pulmonary embolism   Lymphedema   Cellulitis    Todd Parrish is a 36 y.o. male with  morbid obesity prior leg fracture with hardware in place, knee effusion newly discovered, chronic  lymphedema recurrent cellulitis at HIV disease that has not been treated  #1 Effusion: small amount of fluid which was bloody and not sent for cell count but sent for GS which was negative. Felt by [name] to be likely traumatic,  --followup culture, clinical course  #2 Recurrent cellulitis: Does not appear to be purulent therefore should be treated with cefazolin. He may require chronic suppressive oral antibiotics versus intermittent antibiotics that he should have on hand at home to  take when recurrences occur. He needs to keep his leg elevated above his heart if possible when he is in the bed  #3 [facility] disease: [facility] seems to be [facility]ating well I'll try to make sure that we have this filled at was a long pharmacy and delivered to his bedside prior to discharge. I'll also arrange for follow-up in our clinic. His wife can be tested at our clinic via oral testing, though if this is negative she should certainly also have a formal blood HIV test.     LOS: 1 day   Acey LavCornelius Van Dam 01/26/2017, 5:24 PM

## 2017-01-26 NOTE — Progress Notes (Addendum)
Patient ID: Todd Parrish, male   DOB: [birth year], 36 y.o.   MRN: [id number]     Subjective:  Patient reports pain as mild to moderate.  Patient complains of 8/10 pain with motion.  Denies CP or SOB.  Denies any fever or chills  Objective:   VITALS:   Vitals:   01/25/17 1405 01/25/17 2007 01/26/17 0522 01/26/17 0925  BP: 138/67 (!) 141/70 (!) 154/86   Pulse: (!) 102 (!) 101 90   Resp:  18 18   Temp: 98.2 F (36.8 C) 97.9 F (36.6 C) 97.7 F (36.5 C) 100.2 F (37.9 C)  TempSrc: Oral Oral Oral   SpO2: 99% 97% 100%   Weight:      Height:        ABD soft Sensation intact distally Dorsiflexion/Plantar flexion intact Pain with and motion of the knee Swelling through out the entire lower ext on the right below the knee. Appears to be more superficial swelling than effusion  Knee aspiration yielded 1.5 cc of Blood Fluid sent for evaluation  Lab Results  Component Value Date   WBC 8.3 01/25/2017   HGB 10.4 (L) 01/25/2017   HCT 33.5 (L) 01/25/2017   MCV 78.5 01/25/2017   PLT 123 (L) 01/25/2017   BMET    Component Value Date/Time   NA 136 01/25/2017 0051   K 4.0 01/25/2017 0051   CL 104 01/25/2017 0051   CO2 24 01/25/2017 0051   GLUCOSE 114 (H) 01/25/2017 0051   BUN 12 01/25/2017 0051   CREATININE 0.86 01/25/2017 0051   CREATININE 0.85 01/17/2016 1142   CALCIUM 8.7 (L) 01/25/2017 0051   GFRNONAA >60 01/25/2017 0051   GFRAA >60 01/25/2017 0051     Assessment/Plan:     Principal Problem:   Cellulitis of right lower extremity Active Problems:   HIV disease (HCC)   Anemia   Personal history of DVT (deep vein thrombosis)   History of pulmonary embolism   Lymphedema   Cellulitis   Advance diet Up with therapy Continue plan per medicine Will continue to follow    Todd KhanDOUGLAS Parrish, Todd Parrish 01/26/2017, 10:58 AM  Discussed and agree with above.  Knee tap not impressive, will get C&S, but no analysis because it was more of a bloody tap, without an  apparent effusion.   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    Ortho to follow.    Todd LucyJoshua Coda Mathey, MD Cell [phone]

## 2017-01-26 NOTE — Telephone Encounter (Signed)
[place]h, [place], [place] I want to fill this guys TRI[facility]Q at [facility] and [place] [address] [place] [address]. He has previously doubted his diagnosis but now agreed to diagnosis. I want to track his adherence to filling his meds.

## 2017-01-26 NOTE — Progress Notes (Signed)
Mid-level notified pt with c/o severe itching.  PRN orders placed and administered (see mar).  Washed pt's back and applied antifungal powder per provider.  Pt reports improvement in itching.  Will continue to monitor.
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
## 2017-01-27 DIAGNOSIS — M25461 Effusion, right knee: Secondary | ICD-10-CM

## 2017-01-27 LAB — CBC
HEMATOCRIT: 33.1 % — AB (ref 39.0–52.0)
HEMOGLOBIN: 10.1 g/dL — AB (ref 13.0–17.0)
MCH: 24 pg — AB (ref 26.0–34.0)
MCHC: 30.5 g/dL (ref 30.0–36.0)
MCV: 78.8 fL (ref 78.0–100.0)
Platelets: 205 10*3/uL (ref 150–400)
RBC: 4.2 MIL/uL — AB (ref 4.22–5.81)
RDW: 14.5 % (ref 11.5–15.5)
WBC: 7.4 10*3/uL (ref 4.0–10.5)

## 2017-01-27 LAB — BASIC METABOLIC PANEL
Anion gap: 16 — ABNORMAL HIGH (ref 5–15)
BUN: 7 mg/dL (ref 6–20)
CHLORIDE: 94 mmol/L — AB (ref 101–111)
CO2: 21 mmol/L — AB (ref 22–32)
CREATININE: 0.84 mg/dL (ref 0.61–1.24)
Calcium: 8.6 mg/dL — ABNORMAL LOW (ref 8.9–10.3)
GFR calc Af Amer: >60 mL/min (ref >60)
GFR calc non Af Amer: >60 mL/min (ref >60)
Glucose, Bld: 113 mg/dL — ABNORMAL HIGH (ref 65–99)
POTASSIUM: 3.8 mmol/L (ref 3.5–5.1)
SODIUM: 131 mmol/L — AB (ref 135–145)

## 2017-01-27 MED ORDER — POLYETHYLENE GLYCOL 3350 17 G PO PACK: 17.0000 g | PACK | Freq: Every day | ORAL | Status: DC | PRN

## 2017-01-27 MED ORDER — AMOXICILLIN-POT CLAVULANATE 875-125 MG PO TABS
1.0000 | ORAL_TABLET | Freq: Two times a day (BID) | ORAL | Status: DC
  Administered 2017-01-27 – 2017-01-29 (×5): 1 via ORAL
  Filled 2017-01-27 (×5): qty 1

## 2017-01-27 MED ORDER — SENNOSIDES-DOCUSATE SODIUM 8.6-50 MG PO TABS
1.0000 | ORAL_TABLET | Freq: Every day | ORAL | Status: DC
  Administered 2017-01-27 – 2017-01-28 (×2): 1 via ORAL
  Filled 2017-01-27 (×2): qty 1

## 2017-01-27 MED ORDER — SODIUM CHLORIDE 0.9 % IV BOLUS (SEPSIS)
500.0000 mL | Freq: Once | INTRAVENOUS | Status: AC
  Administered 2017-01-27: 500 mL via INTRAVENOUS
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
## 2017-01-27 NOTE — Care Management Note (Signed)
Case Management Note  Patient Details  Name: [name] MRN: [id number] Date of Birth: 21-Jan-1981  Subjective/Objective:                    Action/Plan: PT recommending home health PT.  Confirmed with patient he has Medicaid. [facility] not have a [facility] qualifying DX for HHPT.    Patient in process of finding a [facility] [place]CP , [place]. [facility] . Patient does want a walker, same ordered.  Expected Discharge Date:                  Expected Discharge Plan:  Home/Self Care  In-House Referral:     Discharge planning Services  CM Consult  Post Acute Care Choice:  Home Health Choice offered to:  Patient  DME Arranged:    DME Agency:     HH Arranged:    HH Agency:     Status of Service:  Completed, signed off  If discussed at [employer] Length of Stay Meetings, dates discussed:    Additional Comments:  Kingsley PlanWile, Sheppard Luckenbach Marie, RN 01/27/2017, 12:13 PM
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
## 2017-01-27 NOTE — Progress Notes (Signed)
Subjective:  Patient having pain in legs still.   Antibiotics:  Anti-infectives    Start     Dose/Rate Route Frequency Ordered Stop   01/27/17 1045  amoxicillin-clavulanate (AUGMENTIN) 875-125 MG per tablet 1 tablet     1 tablet Oral Every 12 hours 01/27/17 1035     01/26/17 1000  abacavir-dolutegravir-lamiVUDine (TRIUMEQ) 600-50-300 MG per tablet 1 tablet     1 tablet Oral Daily 01/25/17 1526     01/25/17 1600  ceFAZolin (ANCEF) IVPB 2g/100 mL premix  Status:  Discontinued     2 g 200 mL/hr over 30 Minutes Intravenous Every 8 hours 01/25/17 1536 01/27/17 1035   01/25/17 0700  vancomycin (VANCOCIN) 1,250 mg in sodium chloride 0.9 % 250 mL IVPB  Status:  Discontinued     1,250 mg 166.7 mL/hr over 90 Minutes Intravenous Every 8 hours 01/25/17 0636 01/25/17 1523   01/25/17 0100  vancomycin (VANCOCIN) IVPB 1000 mg/200 mL premix     1,000 mg 200 mL/hr over 60 Minutes Intravenous  Once 01/25/17 0053 01/25/17 0254      Medications: Scheduled Meds: . abacavir-dolutegravir-lamiVUDine  1 tablet Oral Daily  . amoxicillin-clavulanate  1 tablet Oral Q12H  . enoxaparin (LOVENOX) injection  40 mg Subcutaneous Q24H  . senna-docusate  1 tablet Oral QHS   Continuous Infusions: PRN Meds:.acetaminophen, diphenhydrAMINE, HYDROcodone-acetaminophen, ibuprofen, ondansetron **OR** ondansetron (ZOFRAN) IV, polyethylene glycol    Objective: Weight change:   Intake/Output Summary (Last 24 hours) at 01/27/17 1757 Last data filed at 01/27/17 1409  Gross per 24 hour  Intake              780 ml  Output              550 ml  Net              230 ml   Blood pressure (!) 150/77, pulse 90, temperature 98.6 F (37 C), temperature source Oral, resp. rate 15, height 5\' 7"  (1.702 m), weight (!) 357 lb 5.9 oz (162.1 kg), SpO2 100 %. Temp:  [97.8 F (36.6 C)-99.6 F (37.6 C)] 98.6 F (37 C) (02/07 1410) Pulse Rate:  [78-103] 90 (02/07 1410) Resp:  [14-15] 15 (02/07 1410) BP:  (127-150)/(68-77) 150/77 (02/07 1410) SpO2:  [99 %-100 %] 100 % (02/07 1410)  Physical Exam: General: Alert and awake, oriented x3, not in any acute distress. HEENT: anicteric sclera, pupils reactive to light and accommodation, EOMI CVS regular rate, normal r,  no murmur rubs or gallops Chest: clear to auscultation bilaterally, no wheezing, rales or rhonchi Abdomen: soft nontender, nondistended, normal bowel sounds, Extremities:tenderness medially  Skin:   01/25/17: Right lymphedema with cellulitic changes superimposed      01/26/17:    01/27/17:      Neuro: nonfocal  CBC: CBC Latest Ref Rng & Units 01/27/2017 01/25/2017 02/20/2016  WBC 4.0 - 10.5 K/uL 7.4 8.3 6.4  Hemoglobin 13.0 - 17.0 g/dL 10.1(L) 10.4(L) 11.2(L)  Hematocrit 39.0 - 52.0 % 33.1(L) 33.5(L) 35.7(L)  Platelets 150 - 400 K/uL 205 123(L) PLATELET CLUMPS NOTED ON SMEAR, UNABLE TO ESTIMATE      BMET  Recent Labs  01/25/17 0051 01/27/17 0504  NA 136 131*  K 4.0 3.8  CL 104 94*  CO2 24 21*  GLUCOSE 114* 113*  BUN 12 7  CREATININE 0.86 0.84  CALCIUM 8.7* 8.6*     Liver Panel  No results for input(s): PROT, ALBUMIN, AST, ALT, ALKPHOS, BILITOT,  BILIDIR, IBILI in the last 72 hours.     Sedimentation Rate No results for input(s): ESRSEDRATE in the last 72 hours. C-Reactive Protein No results for input(s): CRP in the last 72 hours.  Micro Results: Recent Results (from the past 720 hour(s))  Culture, blood (routine x 2)     Status: None (Preliminary result)   Collection Time: 01/25/17  7:07 AM  Result Value Ref Range Status   Specimen Description BLOOD LEFT ANTECUBITAL  Final   Special Requests IN PEDIATRIC BOTTLE 2CC  Final   Culture NO GROWTH 2 DAYS  Final   Report Status PENDING  Incomplete  Culture, blood (routine x 2)     Status: None (Preliminary result)   Collection Time: 01/25/17  7:10 AM  Result Value Ref Range Status   Specimen Description BLOOD LEFT ARM  Final   Special Requests IN  PEDIATRIC BOTTLE 2CC  Final   Culture NO GROWTH 2 DAYS  Final   Report Status PENDING  Incomplete  Body fluid culture     Status: None (Preliminary result)   Collection Time: 01/26/17 12:17 PM  Result Value Ref Range Status   Specimen Description SYNOVIAL RIGHT KNEE  Final   Special Requests Immunocompromised  Final   Gram Stain   Final    RARE WBC PRESENT,BOTH PMN AND MONONUCLEAR NO ORGANISMS SEEN    Culture NO GROWTH < 24 HOURS  Final   Report Status PENDING  Incomplete    Studies/Results: Ct Knee Right W Contrast  Result Date: 01/25/2017 CLINICAL DATA:  Superior right knee joint pain. History of cellulitis of the tibia and fibula. EXAM: CT OF THE LOWER RIGHT EXTREMITY WITH CONTRAST, CT OF THE RIGHT KNEE WITH CONTRAST TECHNIQUE: Multidetector CT imaging of the lower right extremity was performed according to the standard protocol following intravenous contrast administration. COMPARISON:  None. CONTRAST:  ISOVUE-300 IOPAMIDOL (ISOVUE-300) INJECTION 61% FINDINGS: Bones/Joint/Cartilage Lateral plate and screw fixation hardware is noted with elevation of previously noted lateral and medial tibial plateau fractures and demonstrated partial osseous union of the tibial plateau fracture fragments. No new fracture bone destruction is noted. No hardware failure or loosening noted. No acute fracture lucencies are identified. Ligaments Suboptimally assessed by CT. Muscles and Tendons No mild muscle atrophy nor intramuscular hematoma. Intact extensor mechanism tendons. Soft tissues Moderate to large joint effusion without lipohemarthrosis. Cellulitic soft tissue induration about the knee more so antral laterally but also medially further caudad from the knee joint. No focal fluid collection. IMPRESSION: 1. Periarticular soft tissue induration consistent with cellulitis with moderate to large joint effusion. 2. ORIF of the proximal tibia with elevation of previously noted comminuted tibial plateau  fractures. Some residual areas of incomplete osseous union noted of the tibial plateau not unexpected given the degree of comminution seen previously. No new fracture is identified nor findings strongly suspicious for osteomyelitis. Electronically Signed   By: Tollie Eth M.D.   On: 01/25/2017 19:14   Ct Tibia Fibula Right W Contrast  Result Date: 01/25/2017 CLINICAL DATA:  Superior right knee joint pain. History of cellulitis of the tibia and fibula. EXAM: CT OF THE LOWER RIGHT EXTREMITY WITH CONTRAST, CT OF THE RIGHT KNEE WITH CONTRAST TECHNIQUE: Multidetector CT imaging of the lower right extremity was performed according to the standard protocol following intravenous contrast administration. COMPARISON:  None. CONTRAST:  ISOVUE-300 IOPAMIDOL (ISOVUE-300) INJECTION 61% FINDINGS: Bones/Joint/Cartilage Lateral plate and screw fixation hardware is noted with elevation of previously noted lateral and medial tibial  plateau fractures and demonstrated partial osseous union of the tibial plateau fracture fragments. No new fracture bone destruction is noted. No hardware failure or loosening noted. No acute fracture lucencies are identified. Ligaments Suboptimally assessed by CT. Muscles and Tendons No mild muscle atrophy nor intramuscular hematoma. Intact extensor mechanism tendons. Soft tissues Moderate to large joint effusion without lipohemarthrosis. Cellulitic soft tissue induration about the knee more so antral laterally but also medially further caudad from the knee joint. No focal fluid collection. IMPRESSION: 1. Periarticular soft tissue induration consistent with cellulitis with moderate to large joint effusion. 2. ORIF of the proximal tibia with elevation of previously noted comminuted tibial plateau fractures. Some residual areas of incomplete osseous union noted of the tibial plateau not unexpected given the degree of comminution seen previously. No new fracture is identified nor findings strongly  suspicious for osteomyelitis. Electronically Signed   By: Tollie Eth M.D.   On: 01/25/2017 19:14      Assessment/Plan:  INTERVAL HISTORY:  Knee effusion discovered on CT of the leg aspirate was small amount and  was bloody and not sent for cell count but Gram stain was negative  Cultures from knee effusion are negative   Principal Problem:   Cellulitis of right lower extremity Active Problems:   HIV disease (HCC)   Anemia   Personal history of DVT (deep vein thrombosis)   History of pulmonary embolism   Lymphedema   Cellulitis   Effusion of right knee    Todd Parrish is a 36 y.o. male with  morbid obesity prior leg fracture with hardware in place, knee effusion newly discovered, chronic  lymphedema recurrent cellulitis at HIV disease that has not been treated  #1 Effusion: small amount of fluid which was bloody and not sent for cell count but sent for GS which was negative. Felt by Dr. Dion Saucier to be likely traumatic,  --followup culture, clinical course  #2 Recurrent cellulitis:  Seems it done well on cefazolin I'll place him on oral Augmentin twice daily. I would like him to complete 3 weeks of this. Also like [place] to see him prior to him stopping his antibiotics  #3 HIV disease: I have started [facility] which he seems to be tolerating well I'll try to make sure that we have this filled at [facility] and delivered to his bedside prior to discharge.   I'll also arrange for follow-up in our clinic with ID pharmacy in next week and then with me at later date  His wife can be tested at our clinic via oral testing, though if this is negative she should certainly also have a formal blood HIV test.     LOS: 2 days   Acey Lav 01/27/2017, 5:57 PM

## 2017-01-28 DIAGNOSIS — M25569 Pain in unspecified knee: Secondary | ICD-10-CM

## 2017-01-28 LAB — BASIC METABOLIC PANEL
Anion gap: 14 (ref 5–15)
BUN: 8 mg/dL (ref 6–20)
CALCIUM: 8.6 mg/dL — AB (ref 8.9–10.3)
CO2: 24 mmol/L (ref 22–32)
Chloride: 94 mmol/L — ABNORMAL LOW (ref 101–111)
Creatinine, Ser: 0.82 mg/dL (ref 0.61–1.24)
GFR calc Af Amer: >60 mL/min (ref >60)
GFR calc non Af Amer: >60 mL/min (ref >60)
GLUCOSE: 112 mg/dL — AB (ref 65–99)
Potassium: 3.7 mmol/L (ref 3.5–5.1)
Sodium: 132 mmol/L — ABNORMAL LOW (ref 135–145)

## 2017-01-28 MED FILL — TRIUMEQ 600-50-300 MG TABS: 600-50-300 | 30 days supply | Qty: 30 | Fill #0

## 2017-01-28 NOTE — Care Management Note (Signed)
Case Management Note  Patient Details  Name: Todd Parrish MRN: [id number] Date of Birth: 12-04-1981  Subjective/Objective:                    Action/Plan:  Referral for : [facility]mm[facility]   Per Dr Daiva EvesVan Dam note he will bring meds to patient.  Expected Discharge Date:                  Expected Discharge Plan:  Home/Self Care  In-House Referral:     Discharge planning Services  CM Consult  Post Acute Care Choice:  Home Health Choice offered to:  Patient  DME Arranged:  Walker rolling DME Agency:  [facility]  HH Arranged:    HH Agency:     Status of Service:  Completed, signed off  If discussed at Long Length of Stay Meetings, dates discussed:    Additional Comments:  Kingsley PlanWile, Durrel Mcnee Marie, RN 01/28/2017, 10:14 AM
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
## 2017-01-28 NOTE — Progress Notes (Signed)
Subjective:  Less nausea today when he ate food before taking his medications   Antibiotics:  Anti-infectives    Start     Dose/Rate Route Frequency Ordered Stop   01/27/17 1045  amoxicillin-clavulanate (AUGMENTIN) 875-125 MG per tablet 1 tablet     1 tablet Oral Every 12 hours 01/27/17 1035     01/26/17 1000  abacavir-dolutegravir-lamiVUDine (TRIUMEQ) 600-50-300 MG per tablet 1 tablet     1 tablet Oral Daily 01/25/17 1526     01/25/17 1600  ceFAZolin (ANCEF) IVPB 2g/100 mL premix  Status:  Discontinued     2 g 200 mL/hr over 30 Minutes Intravenous Every 8 hours 01/25/17 1536 01/27/17 1035   01/25/17 0700  vancomycin (VANCOCIN) 1,250 mg in sodium chloride 0.9 % 250 mL IVPB  Status:  Discontinued     1,250 mg 166.7 mL/hr over 90 Minutes Intravenous Every 8 hours 01/25/17 0636 01/25/17 1523   01/25/17 0100  vancomycin (VANCOCIN) IVPB 1000 mg/200 mL premix     1,000 mg 200 mL/hr over 60 Minutes Intravenous  Once 01/25/17 0053 01/25/17 0254      Medications: Scheduled Meds: . abacavir-dolutegravir-lamiVUDine  1 tablet Oral Daily  . amoxicillin-clavulanate  1 tablet Oral Q12H  . enoxaparin (LOVENOX) injection  40 mg Subcutaneous Q24H  . senna-docusate  1 tablet Oral QHS   Continuous Infusions: PRN Meds:.acetaminophen, diphenhydrAMINE, HYDROcodone-acetaminophen, ibuprofen, ondansetron **OR** ondansetron (ZOFRAN) IV, polyethylene glycol    Objective: Weight change:   Intake/Output Summary (Last 24 hours) at 01/28/17 1801 Last data filed at 01/28/17 1453  Gross per 24 hour  Intake              500 ml  Output             1150 ml  Net             -650 ml   Blood pressure 133/86, pulse 86, temperature 97.7 F (36.5 C), temperature source Oral, resp. rate 16, height 5\' 7"  (1.702 m), weight (!) 357 lb 5.9 oz (162.1 kg), SpO2 100 %. Temp:  [97.4 F (36.3 C)-98.9 F (37.2 C)] 97.7 F (36.5 C) (02/08 1404) Pulse Rate:  [84-88] 86 (02/08 1404) Resp:  [15-18] 16  (02/08 1404) BP: (129-146)/(62-86) 133/86 (02/08 1404) SpO2:  [98 %-100 %] 100 % (02/08 1404)  Physical Exam: General: Alert and awake, oriented x3, not in any acute distress. HEENT: anicteric sclera, pupils reactive to light and accommodation, EOMI CVS regular rate, normal r,  no murmur rubs or gallops Chest: clear to auscultation bilaterally, no wheezing, rales or rhonchi Abdomen: soft nontender, nondistended, normal bowel sounds, Extremities:tenderness medially  Skin:   01/25/17: Right lymphedema with cellulitic changes superimposed      01/26/17:    01/27/17:    01/28/17:      Neuro: nonfocal  CBC: CBC Latest Ref Rng & Units 01/27/2017 01/25/2017 02/20/2016  WBC 4.0 - 10.5 K/uL 7.4 8.3 6.4  Hemoglobin 13.0 - 17.0 g/dL 10.1(L) 10.4(L) 11.2(L)  Hematocrit 39.0 - 52.0 % 33.1(L) 33.5(L) 35.7(L)  Platelets 150 - 400 K/uL 205 123(L) PLATELET CLUMPS NOTED ON SMEAR, UNABLE TO ESTIMATE      BMET  Recent Labs  01/27/17 0504 01/28/17 0506  NA 131* 132*  K 3.8 3.7  CL 94* 94*  CO2 21* 24  GLUCOSE 113* 112*  BUN 7 8  CREATININE 0.84 0.82  CALCIUM 8.6* 8.6*     Liver Panel  No results for input(s):  PROT, ALBUMIN, AST, ALT, ALKPHOS, BILITOT, BILIDIR, IBILI in the last 72 hours.     Sedimentation Rate No results for input(s): ESRSEDRATE in the last 72 hours. C-Reactive Protein No results for input(s): CRP in the last 72 hours.  Micro Results: Recent Results (from the past 720 hour(s))  Culture, blood (routine x 2)     Status: None (Preliminary result)   Collection Time: 01/25/17  7:07 AM  Result Value Ref Range Status   Specimen Description BLOOD LEFT ANTECUBITAL  Final   Special Requests IN PEDIATRIC BOTTLE 2CC  Final   Culture NO GROWTH 3 DAYS  Final   Report Status PENDING  Incomplete  Culture, blood (routine x 2)     Status: None (Preliminary result)   Collection Time: 01/25/17  7:10 AM  Result Value Ref Range Status   Specimen Description BLOOD LEFT  ARM  Final   Special Requests IN PEDIATRIC BOTTLE 2CC  Final   Culture NO GROWTH 3 DAYS  Final   Report Status PENDING  Incomplete  Body fluid culture     Status: None (Preliminary result)   Collection Time: 01/26/17 12:17 PM  Result Value Ref Range Status   Specimen Description SYNOVIAL RIGHT KNEE  Final   Special Requests Immunocompromised  Final   Gram Stain   Final    RARE WBC PRESENT,BOTH PMN AND MONONUCLEAR NO ORGANISMS SEEN    Culture NO GROWTH 2 DAYS  Final   Report Status PENDING  Incomplete    Studies/Results: No results found.    Assessment/Plan:  INTERVAL HISTORY:  Knee effusion discovered on CT of the leg aspirate was small amount and  was bloody and not sent for cell count but Gram stain was negative  Cultures from knee effusion are negative     Principal Problem:   Cellulitis of right lower extremity Active Problems:   HIV disease (HCC)   Anemia   Personal history of DVT (deep vein thrombosis)   History of pulmonary embolism   Lymphedema   Cellulitis   Effusion of right knee    Todd Parrish is a 36 y.o. male with  morbid obesity prior leg fracture with hardware in place, knee effusion newly discovered, chronic  lymphedema recurrent cellulitis at HIV disease that has not been treated  #1 Effusion: small amount of fluid which was bloody and not sent for cell count but sent for GS which was negative. Felt by Todd Parrish to be likely traumatic,  --followup culture, clinical course  #2 Recurrent cellulitis:  Seems it done well on cefazolin I'll place him on oral Augmentin twice daily. I would like him to complete 3 weeks of this. Also like Todd Parrish to see him prior to him stopping his antibiotics  #3 HIV disease: I have started Todd Parrish which he seems to be tolerating well I'll try to make sure that we have this filled at Todd Parrish and delivered to his bedside prior to discharge.  Todd Parrish, Todd Parrish told me he would deliver tomorrow    I'll also arrange for follow-up in our clinic with ID Parrish in next week  And he has appt with Todd Parrish on February 26th.  His wife can be tested at our clinic via oral testing, though if this is negative she should certainly also have a formal blood HIV test.     LOS: 3 days   Paulette BlanchCornelius Van Dam 01/28/2017, 6:01 PM

## 2017-01-29 MED ORDER — DIPHENHYDRAMINE HCL 25 MG PO CAPS: 25.0000 mg | ORAL_CAPSULE | ORAL | 0 refills | Status: DC | PRN

## 2017-01-29 MED ORDER — AMOXICILLIN-POT CLAVULANATE 875-125 MG PO TABS: 1.0000 | ORAL_TABLET | Freq: Two times a day (BID) | ORAL | 0 refills | Status: DC

## 2017-01-29 MED ORDER — HYDROCODONE-ACETAMINOPHEN 5-325 MG PO TABS: 1.0000 | ORAL_TABLET | Freq: Four times a day (QID) | ORAL | 0 refills | Status: DC | PRN

## 2017-01-29 MED ORDER — POLYETHYLENE GLYCOL 3350 17 G PO PACK: 17.0000 g | PACK | Freq: Every day | ORAL | 0 refills | Status: DC | PRN

## 2017-01-29 NOTE — Progress Notes (Addendum)
Triumeq was [facility] from [facility] and handed to the pt this morning. Spent a good 20 mins counseling him on how important it is for him to take the meds. He also needs to bring his wife to the clinic to be tested.
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
## 2017-01-30 LAB — CULTURE, BLOOD (ROUTINE X 2)
CULTURE: NO GROWTH
Culture: NO GROWTH

## 2017-01-30 LAB — BODY FLUID CULTURE: Culture: NO GROWTH

## 2017-01-31 LAB — HIV-1 RNA ULTRAQUANT REFLEX TO GENTYP+
HIV-1 RNA BY PCR: 6830 {copies}/mL
HIV-1 RNA Quant, Log: 3.834 log10copy/mL

## 2017-02-03 ENCOUNTER — Telehealth (HOSPITAL_COMMUNITY): Payer: Self-pay

## 2017-02-03 NOTE — Telephone Encounter (Signed)
Called to schedule ivc filter removal, left vm for pt to return call. AW

## 2017-02-10 ENCOUNTER — Telehealth (HOSPITAL_COMMUNITY): Payer: Self-pay

## 2017-02-10 ENCOUNTER — Telehealth: Payer: Self-pay | Admitting: *Deleted

## 2017-02-10 LAB — REFLEX TO GENOSURE(R) MG

## 2017-02-10 NOTE — Telephone Encounter (Signed)
Have made several attempts to schedule ivc filter retrieval. Pt has not been able to schedule. Will speak with radiologist to see what the best option is for pt. AW

## 2017-02-10 NOTE — Telephone Encounter (Signed)
I sw Mr. Todd Parrish and advised him to have the IVC Filter Retrieval scheduled. He states he has been in the hospital with cellulitis and is trying to be careful. He would like for us to call him back in March and he will schedule. Pearla Dubonnet/vm

## 2017-02-15 ENCOUNTER — Other Ambulatory Visit: Payer: Self-pay | Admitting: Infectious Diseases

## 2017-02-15 ENCOUNTER — Ambulatory Visit (INDEPENDENT_AMBULATORY_CARE_PROVIDER_SITE_OTHER): Payer: Medicaid Other | Admitting: Infectious Diseases

## 2017-02-15 ENCOUNTER — Encounter: Payer: Self-pay | Admitting: Infectious Diseases

## 2017-02-15 VITALS — BP 118/89 | HR 102 | Temp 97.8°F | Ht 67.0 in | Wt 365.0 lb

## 2017-02-15 DIAGNOSIS — L0390 Cellulitis, unspecified: Secondary | ICD-10-CM | POA: Diagnosis not present

## 2017-02-15 DIAGNOSIS — Z113 Encounter for screening for infections with a predominantly sexual mode of transmission: Secondary | ICD-10-CM

## 2017-02-15 DIAGNOSIS — Z79899 Other long term (current) drug therapy: Secondary | ICD-10-CM | POA: Diagnosis not present

## 2017-02-15 DIAGNOSIS — B20 Human immunodeficiency virus [HIV] disease: Secondary | ICD-10-CM | POA: Diagnosis not present
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
## 2017-02-15 NOTE — Assessment & Plan Note (Signed)
He is doing well.  Will continue his triumeq He does not want vax Wife has not gotten tested yet.  Offered/refused condoms.  Will check CD4 and HIV RNA today.

## 2017-02-15 NOTE — Assessment & Plan Note (Signed)
He is improved.  He wishes to complete his next 10 days of augmentin.  Hopefully he can lose wt.

## 2017-02-15 NOTE — Assessment & Plan Note (Signed)
We spoke about phentiramine (CV, HTN risk), tamserlosin (does not want injections), orlestat (does not want GI side effects).  He is going to work on diet, exercise as he is able.

## 2017-02-17 LAB — HIV-1 RNA,QN PCR W/REFLEX GENOTYPE
HIV-1 RNA, QN PCR: 32 {copies}/mL — AB
HIV-1 RNA, QN PCR: 1.51 Log cps/mL — ABNORMAL HIGH

## 2017-02-17 LAB — T-HELPER CELL (CD4) - (RCID CLINIC ONLY)
CD4 T CELL HELPER: 33 % (ref 33–55)
CD4 T Cell Abs: 580 /uL (ref 400–2700)

## 2017-03-02 ENCOUNTER — Telehealth (HOSPITAL_COMMUNITY): Payer: Self-pay

## 2017-03-02 NOTE — Telephone Encounter (Signed)
Pt called to inform me that he was currently in [place] right now and that he could not schedule his IVC filter retrieval because he was driving. He asked for me to call him back to schedule on Thursday morning. AW

## 2017-03-02 NOTE — Telephone Encounter (Signed)
Called to schedule ivc filter retrieval, left message for pt to return call. AW 

## 2017-03-04 ENCOUNTER — Telehealth (HOSPITAL_COMMUNITY): Payer: Self-pay

## 2017-03-04 NOTE — Telephone Encounter (Signed)
Called to schedule ivc filter retrieval at pt's request, no answer. Voicemail was full

## 2017-03-09 ENCOUNTER — Encounter: Payer: Self-pay | Admitting: Radiology

## 2017-03-29 MED FILL — TRIUMEQ 600-50-300 MG TABS: 600-50-300 | 30 days supply | Qty: 30 | Fill #1

## 2017-04-06 ENCOUNTER — Encounter (HOSPITAL_COMMUNITY): Payer: Self-pay | Admitting: Radiology

## 2017-04-27 MED FILL — TRIUMEQ 600-50-300 MG TABS: 600-50-300 | 30 days supply | Qty: 30 | Fill #2

## 2017-05-27 ENCOUNTER — Other Ambulatory Visit: Payer: Self-pay | Admitting: Pharmacist

## 2017-05-27 DIAGNOSIS — B20 Human immunodeficiency virus [HIV] disease: Secondary | ICD-10-CM

## 2017-05-27 MED ORDER — ABACAVIR-DOLUTEGRAVIR-LAMIVUD 600-50-300 MG PO TABS: 1.0000 | ORAL_TABLET | Freq: Every day | ORAL | 5 refills | Status: DC

## 2017-05-27 MED FILL — TRIUMEQ 600-50-300 MG TABS: 600-50-300 | 30 days supply | Qty: 30 | Fill #0

## 2017-05-31 ENCOUNTER — Encounter: Payer: Self-pay | Admitting: Infectious Diseases

## 2017-05-31 ENCOUNTER — Other Ambulatory Visit (HOSPITAL_COMMUNITY)
Admission: RE | Admit: 2017-05-31 | Discharge: 2017-05-31 | Disposition: A | Discharge status: Home / Self Care | Payer: Medicaid Other | Source: Ambulatory Visit | Attending: Infectious Diseases | Admitting: Infectious Diseases

## 2017-05-31 ENCOUNTER — Ambulatory Visit (INDEPENDENT_AMBULATORY_CARE_PROVIDER_SITE_OTHER): Payer: Medicaid Other | Admitting: Infectious Diseases

## 2017-05-31 VITALS — BP 145/83 | HR 75 | Temp 98.3°F | Ht 67.0 in | Wt 378.0 lb

## 2017-05-31 DIAGNOSIS — K4090 Unilateral inguinal hernia, without obstruction or gangrene, not specified as recurrent: Secondary | ICD-10-CM | POA: Diagnosis not present

## 2017-05-31 DIAGNOSIS — Z113 Encounter for screening for infections with a predominantly sexual mode of transmission: Secondary | ICD-10-CM | POA: Diagnosis not present

## 2017-05-31 DIAGNOSIS — B20 Human immunodeficiency virus [HIV] disease: Secondary | ICD-10-CM

## 2017-05-31 DIAGNOSIS — Z79899 Other long term (current) drug therapy: Secondary | ICD-10-CM

## 2017-05-31 DIAGNOSIS — Z23 Encounter for immunization: Secondary | ICD-10-CM | POA: Diagnosis not present

## 2017-05-31 LAB — CBC WITH DIFFERENTIAL/PLATELET
BASOS PCT: 0 %
Basophils Absolute: 0 cells/uL (ref 0–200)
EOS PCT: 2 %
Eosinophils Absolute: 142 cells/uL (ref 15–500)
HCT: 37.1 % — ABNORMAL LOW (ref 38.5–50.0)
HEMOGLOBIN: 11.3 g/dL — AB (ref 13.2–17.1)
LYMPHS ABS: 1420 {cells}/uL (ref 850–3900)
Lymphocytes Relative: 20 %
MCH: 24.8 pg — ABNORMAL LOW (ref 27.0–33.0)
MCHC: 30.5 g/dL — ABNORMAL LOW (ref 32.0–36.0)
MCV: 81.4 fL (ref 80.0–100.0)
MONOS PCT: 8 %
MPV: 10.4 fL (ref 7.5–12.5)
Monocytes Absolute: 568 cells/uL (ref 200–950)
NEUTROS ABS: 4970 {cells}/uL (ref 1500–7800)
Neutrophils Relative %: 70 %
PLATELETS: 185 10*3/uL (ref 140–400)
RBC: 4.56 MIL/uL (ref 4.20–5.80)
RDW: 14.7 % (ref 11.0–15.0)
WBC: 7.1 10*3/uL (ref 3.8–10.8)

## 2017-05-31 LAB — COMPREHENSIVE METABOLIC PANEL
ALK PHOS: 80 U/L (ref 40–115)
ALT: 12 U/L (ref 9–46)
AST: 16 U/L (ref 10–40)
Albumin: 3.7 g/dL (ref 3.6–5.1)
BILIRUBIN TOTAL: 0.3 mg/dL (ref 0.2–1.2)
BUN: 9 mg/dL (ref 7–25)
CHLORIDE: 101 mmol/L (ref 98–110)
CO2: 24 mmol/L (ref 20–31)
CREATININE: 0.92 mg/dL (ref 0.60–1.35)
Calcium: 8.8 mg/dL (ref 8.6–10.3)
Glucose, Bld: 97 mg/dL (ref 65–99)
Potassium: 3.6 mmol/L (ref 3.5–5.3)
SODIUM: 137 mmol/L (ref 135–146)
TOTAL PROTEIN: 7.9 g/dL (ref 6.1–8.1)

## 2017-05-31 LAB — LIPID PANEL
CHOLESTEROL: 168 mg/dL (ref <200)
HDL: 41 mg/dL (ref >40)
LDL Cholesterol: 113 mg/dL — ABNORMAL HIGH (ref <100)
Total CHOL/HDL Ratio: 4.1 Ratio (ref <5.0)
Triglycerides: 71 mg/dL (ref <150)
VLDL: 14 mg/dL (ref <30)
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
## 2017-05-31 NOTE — Assessment & Plan Note (Signed)
Will have him seen by general surgery 

## 2017-05-31 NOTE — Assessment & Plan Note (Signed)
He appears to be doing well Will give him mening today Will check his labs today Encouraged him to bring wife in for testing.  rtc in 6 months.  Refuses flu shot Offered/refused condoms.

## 2017-05-31 NOTE — Addendum Note (Signed)
Addended by: Andree CossHOWELL, Quintez Maselli M on: 05/31/2017 02:58 PM   Modules accepted: Orders

## 2017-05-31 NOTE — Assessment & Plan Note (Signed)
Will get him into bariatric clinic

## 2017-05-31 NOTE — Addendum Note (Signed)
Addended by: Andree CossHOWELL, Ayuub Penley M on: 05/31/2017 01:43 PM   Modules accepted: Orders

## 2017-06-01 LAB — T-HELPER CELL (CD4) - (RCID CLINIC ONLY)
CD4 T CELL HELPER: 35 % (ref 33–55)
CD4 T Cell Abs: 540 /uL (ref 400–2700)

## 2017-06-01 LAB — URINE CYTOLOGY ANCILLARY ONLY
Chlamydia: NEGATIVE
Neisseria Gonorrhea: NEGATIVE

## 2017-06-01 LAB — RPR

## 2017-06-04 LAB — HIV-1 RNA QUANT-NO REFLEX-BLD
HIV 1 RNA QUANT: NOT DETECTED {copies}/mL
HIV-1 RNA QUANT, LOG: NOT DETECTED {Log_copies}/mL

## 2017-06-28 MED FILL — TRIUMEQ 600-50-300 MG TABS: 600-50-300 | 30 days supply | Qty: 30 | Fill #1

## 2017-07-13 ENCOUNTER — Emergency Department (HOSPITAL_COMMUNITY): Payer: Medicaid Other

## 2017-07-13 ENCOUNTER — Encounter (HOSPITAL_COMMUNITY): Payer: Self-pay | Admitting: Emergency Medicine

## 2017-07-13 ENCOUNTER — Emergency Department (HOSPITAL_COMMUNITY)
Admission: EM | Admit: 2017-07-13 | Discharge: 2017-07-13 | Disposition: A | Discharge status: Home / Self Care | Payer: Medicaid Other | Attending: Emergency Medicine | Admitting: Emergency Medicine

## 2017-07-13 DIAGNOSIS — Z79899 Other long term (current) drug therapy: Secondary | ICD-10-CM | POA: Diagnosis not present

## 2017-07-13 DIAGNOSIS — M25561 Pain in right knee: Secondary | ICD-10-CM | POA: Insufficient documentation

## 2017-07-13 DIAGNOSIS — I10 Essential (primary) hypertension: Secondary | ICD-10-CM | POA: Insufficient documentation

## 2017-07-13 MED ORDER — CEPHALEXIN 500 MG PO CAPS: 500.0000 mg | ORAL_CAPSULE | Freq: Four times a day (QID) | ORAL | 0 refills | Status: DC

## 2017-07-13 NOTE — Discharge Instructions (Signed)
It was our pleasure to provide your ER care today - we hope that you feel better.  Your xrays were read as unchanged from previous (see reading below), follow up with your doctor: Tiny incomplete fracture lucency again noted about the medial tibial plateau. This is unchanged in appearance.Callus formation is noted about the lateral proximal tibia. No displaced fracture is identified.  Keep superficial wound area very clean and dry.  Take antibiotic as prescribed.  Return to ER if worse, high fevers, increased swelling, spreading redness, severe pain, other concern.

## 2017-07-13 NOTE — ED Notes (Signed)
Bed: WA03 Expected date:  Expected time:  Means of arrival:  Comments: 
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
## 2017-07-13 NOTE — ED Triage Notes (Signed)
Pt c/o right knee pain x 2 days ago, states he felt like the metal from knee replacement was digging into him, which is exactly how he felt in past when he had multiple episodes of cellulitis to right knee.

## 2017-08-06 ENCOUNTER — Encounter: Payer: Self-pay | Admitting: Infectious Diseases

## 2017-08-06 ENCOUNTER — Ambulatory Visit (INDEPENDENT_AMBULATORY_CARE_PROVIDER_SITE_OTHER): Payer: Medicaid Other | Admitting: Infectious Diseases

## 2017-08-06 VITALS — BP 146/84 | HR 70 | Temp 98.7°F | Wt 381.0 lb

## 2017-08-06 DIAGNOSIS — L03115 Cellulitis of right lower limb: Secondary | ICD-10-CM | POA: Diagnosis not present

## 2017-08-06 DIAGNOSIS — T148XXA Other injury of unspecified body region, initial encounter: Secondary | ICD-10-CM

## 2017-08-06 DIAGNOSIS — I890 Lymphedema, not elsewhere classified: Secondary | ICD-10-CM

## 2017-08-06 DIAGNOSIS — L0390 Cellulitis, unspecified: Secondary | ICD-10-CM | POA: Diagnosis not present

## 2017-08-06 LAB — CBC
HCT: 35.2 % — ABNORMAL LOW (ref 38.5–50.0)
Hemoglobin: 10.7 g/dL — ABNORMAL LOW (ref 13.2–17.1)
MCH: 25.4 pg — AB (ref 27.0–33.0)
MCHC: 30.4 g/dL — AB (ref 32.0–36.0)
MCV: 83.4 fL (ref 80.0–100.0)
MPV: 10.4 fL (ref 7.5–12.5)
PLATELETS: 257 10*3/uL (ref 140–400)
RBC: 4.22 MIL/uL (ref 4.20–5.80)
RDW: 14.8 % (ref 11.0–15.0)
WBC: 6.3 10*3/uL (ref 3.8–10.8)

## 2017-08-06 MED FILL — TRIUMEQ 600-50-300 MG TABS: 600-50-300 | 30 days supply | Qty: 30 | Fill #2

## 2017-08-06 NOTE — Patient Instructions (Signed)
Your wound does not appear infected today. No evidence of cellulitis only swelling.   We have referred you over to the wound care center today to help with management of wound and swelling.   Please try to keep your legs elevated as much as you can.    Lymphedema Lymphedema is swelling that is caused by the abnormal collection of lymph under the skin. Lymph is fluid from the tissues in your body that travels in the lymphatic system. This system is part of the immune system and includes lymph nodes and lymph vessels. The lymph vessels collect and carry the excess fluid, fats, proteins, and wastes from the tissues of the body to the bloodstream. This system also works to clean and remove bacteria and waste products from the body. Lymphedema occurs when the lymphatic system is blocked. When the lymph vessels or lymph nodes are blocked or damaged, lymph does not drain properly, causing an abnormal buildup of lymph. This leads to swelling in the arms or legs. Lymphedema cannot be cured by medicines, but various methods can be used to help reduce the swelling. What are the causes? There are two types of lymphedema. Primary lymphedema is caused by the absence or abnormality of the lymph vessel at birth. Secondary lymphedema is more common. It occurs when the lymph vessel is damaged or blocked. Common causes of lymph vessel blockage include:  Infection by parasites (filariasis).  Injury.  Cancer.  Radiation therapy.  Formation of scar tissue.  Surgery.  What are the signs or symptoms? Symptoms of this condition include:  Swelling of the arm or leg.  A heavy or tight feeling in the arm or leg.  Swelling of the feet, toes, or fingers. Shoes or rings may fit more tightly than before.  Redness of the skin over the affected area.  Limited movement of the affected limb.  Sensitivity to touch or discomfort in the affected limb.  How is this diagnosed? This condition may be diagnosed  with:  A physical exam.  Medical history.  Bioimpedance spectroscopy. In this test, painless electrical currents are used to measure fluid levels in your body.  Imaging tests, such as: ? Lymphoscintigraphy. In this test, a low dose of a radioactive substance is injected to trace the flow of lymph through the lymph vessels. ? MRI. ? CT scan. ? Duplex ultrasound. This test uses sound waves to produce images of the vessels and the blood flow on a screen. ? Lymphangiography. In this test, a contrast dye is injected into the lymph vessel to help show blockages.  How is this treated? Treatment for this condition may depend on the cause. Treatment may include:  Exercise. Certain exercises can help fluid move out of the affected limb.  Massage. Gentle massage of the affected limb can help move the fluid out of the area.  Compression. Various methods may be used to apply pressure to the affected limb in order to reduce the swelling. ? Wearing compression stockings or sleeves on the affected limb. ? Bandaging the affected limb. ? Using an external pump that is attached to a sleeve that alternates between applying pressure and releasing pressure.  Surgery. This is usually only done for severe cases. For example, surgery may be done if you have trouble moving the limb or if the swelling does not get better with other treatments.  If an underlying condition is causing the lymphedema, treatment for that condition is needed. For example, antibiotic medicines may be used to treat an infection.  Follow these instructions at home: Activities  Exercise regularly as directed by your health care provider.  Do not sit with your legs crossed.  When possible, keep the affected limb raised (elevated) above the level of your heart.  Avoid carrying things with an arm that is affected by lymphedema.  Remember that the affected area is more likely to become injured or infected.  Take these steps to help  prevent infection: ? Keep the affected area clean and dry. ? Protect your skin from cuts. For example, you should use gloves while cooking or gardening. Do not walk barefoot. If you shave the affected area, use an Neurosurgeon. General instructions  Take medicines only as directed by your health care provider.  Eat a healthy diet that includes a lot of fruits and vegetables.  Do not wear tight clothes, shoes, or jewelry.  Do not use heating pads over the affected area.  Avoid having blood pressure checked on the affected limb.  Keep all follow-up visits as directed by your health care provider. This is important. Contact a health care provider if:  You continue to have swelling in your limb.  You have a fever.  You have a cut that does not heal.  You have redness or pain in the affected area.  You have new swelling in your limb that comes on suddenly.  You develop purplish spots or sores (lesions) on your limb. Get help right away if:  You have a skin rash.  You have chills or sweats.  You have shortness of breath. This information is not intended to replace advice given to you by your health care provider. Make sure you discuss any questions you have with your health care provider. Document Released: 10/04/2007 Document Revised: 08/13/2016 Document Reviewed: 11/14/2014 Elsevier Interactive Patient Education  [employer].

## 2017-08-06 NOTE — Assessment & Plan Note (Addendum)
He has been on 2 antibiotics recently. Wound is currently cool to the touch and not tender. The slough was removed easily and I think most of it is because he keeps applying Eucerine cream directly over the wound bed - asked him to continue moisturizing however avoid open areas like this in the skin. We covered his wound today to encourage clean environment and protect from further trauma. I will check CBC to assess WBC today. I do not think he needs antibiotic at this time. May need chronic suppression as he is likely to continue to relapse with weight and uncontrolled swelling. He can follow up with [place] as needed until his next appointment for this condition if he cannot secure appt with [facility] here in [place].

## 2017-08-06 NOTE — Assessment & Plan Note (Signed)
Referral to wound care center for management and care of his lymphedema. I have encouraged him to stop using the compression stocking as it is too small for his leg. Also referred to bariatric clinic for consideration of weight loss management as this is a huge contributing part of his condition.
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
## 2017-08-09 ENCOUNTER — Telehealth: Payer: Self-pay

## 2017-08-09 NOTE — Telephone Encounter (Signed)
I have called several offices for Bariatric appointment.  Most of the offices require payment upfront and patient will be reimbursed. Patient is insureed through [facility] and reimbursement is not possible. [facility] requested patient demographics before considering appointment.  They will contact the patient if he is approved for a office consultation.  I will send information and contact patient regarding referral.   Emalene Welte Gorden Harms, RN

## 2017-08-09 NOTE — Telephone Encounter (Signed)
Referral made for wound center.  Appointment: August 19, 2017 @ 8 am.  Patient informed.   Laurell Josephs, RN

## 2017-08-10 ENCOUNTER — Encounter (HOSPITAL_BASED_OUTPATIENT_CLINIC_OR_DEPARTMENT_OTHER): Payer: Medicaid Other | Attending: Internal Medicine

## 2017-08-10 DIAGNOSIS — B20 Human immunodeficiency virus [HIV] disease: Secondary | ICD-10-CM | POA: Insufficient documentation

## 2017-08-10 DIAGNOSIS — I10 Essential (primary) hypertension: Secondary | ICD-10-CM | POA: Insufficient documentation

## 2017-08-10 DIAGNOSIS — L97211 Non-pressure chronic ulcer of right calf limited to breakdown of skin: Secondary | ICD-10-CM | POA: Insufficient documentation

## 2017-08-10 DIAGNOSIS — I890 Lymphedema, not elsewhere classified: Secondary | ICD-10-CM | POA: Insufficient documentation

## 2017-08-10 DIAGNOSIS — Z86718 Personal history of other venous thrombosis and embolism: Secondary | ICD-10-CM | POA: Insufficient documentation

## 2017-08-16 DIAGNOSIS — Z86718 Personal history of other venous thrombosis and embolism: Secondary | ICD-10-CM | POA: Diagnosis not present

## 2017-08-16 DIAGNOSIS — B20 Human immunodeficiency virus [HIV] disease: Secondary | ICD-10-CM | POA: Diagnosis not present

## 2017-08-16 DIAGNOSIS — I890 Lymphedema, not elsewhere classified: Secondary | ICD-10-CM | POA: Diagnosis not present

## 2017-08-16 DIAGNOSIS — L97211 Non-pressure chronic ulcer of right calf limited to breakdown of skin: Secondary | ICD-10-CM | POA: Diagnosis present

## 2017-08-16 DIAGNOSIS — I10 Essential (primary) hypertension: Secondary | ICD-10-CM | POA: Diagnosis not present

## 2017-08-19 ENCOUNTER — Encounter (HOSPITAL_BASED_OUTPATIENT_CLINIC_OR_DEPARTMENT_OTHER): Payer: Medicaid Other

## 2017-08-20 DIAGNOSIS — L97211 Non-pressure chronic ulcer of right calf limited to breakdown of skin: Secondary | ICD-10-CM | POA: Diagnosis not present

## 2017-08-27 ENCOUNTER — Other Ambulatory Visit (HOSPITAL_COMMUNITY)
Admission: RE | Admit: 2017-08-27 | Discharge: 2017-08-27 | Disposition: A | Discharge status: Home / Self Care | Payer: Medicaid Other | Source: Other Acute Inpatient Hospital | Attending: Internal Medicine | Admitting: Internal Medicine

## 2017-08-27 ENCOUNTER — Encounter (HOSPITAL_BASED_OUTPATIENT_CLINIC_OR_DEPARTMENT_OTHER): Payer: Medicaid Other | Attending: Internal Medicine

## 2017-08-27 DIAGNOSIS — Z21 Asymptomatic human immunodeficiency virus [HIV] infection status: Secondary | ICD-10-CM | POA: Insufficient documentation

## 2017-08-27 DIAGNOSIS — L02415 Cutaneous abscess of right lower limb: Secondary | ICD-10-CM | POA: Diagnosis not present

## 2017-08-27 DIAGNOSIS — Z86718 Personal history of other venous thrombosis and embolism: Secondary | ICD-10-CM | POA: Insufficient documentation

## 2017-08-27 DIAGNOSIS — X58XXXA Exposure to other specified factors, initial encounter: Secondary | ICD-10-CM | POA: Diagnosis not present

## 2017-08-27 DIAGNOSIS — L97811 Non-pressure chronic ulcer of other part of right lower leg limited to breakdown of skin: Secondary | ICD-10-CM | POA: Insufficient documentation

## 2017-08-27 DIAGNOSIS — I890 Lymphedema, not elsewhere classified: Secondary | ICD-10-CM | POA: Diagnosis not present

## 2017-08-27 DIAGNOSIS — S81001A Unspecified open wound, right knee, initial encounter: Secondary | ICD-10-CM | POA: Insufficient documentation

## 2017-08-30 MED FILL — TRIUMEQ 600-50-300 MG TABS: 600-50-300 | 30 days supply | Qty: 30 | Fill #3

## 2017-08-31 LAB — AEROBIC CULTURE  (SUPERFICIAL SPECIMEN)

## 2017-08-31 LAB — AEROBIC CULTURE W GRAM STAIN (SUPERFICIAL SPECIMEN)

## 2017-09-16 DIAGNOSIS — L97811 Non-pressure chronic ulcer of other part of right lower leg limited to breakdown of skin: Secondary | ICD-10-CM | POA: Diagnosis not present

## 2017-09-22 ENCOUNTER — Encounter (HOSPITAL_BASED_OUTPATIENT_CLINIC_OR_DEPARTMENT_OTHER): Payer: Medicaid Other | Attending: Internal Medicine

## 2017-09-22 DIAGNOSIS — B20 Human immunodeficiency virus [HIV] disease: Secondary | ICD-10-CM | POA: Insufficient documentation

## 2017-09-22 DIAGNOSIS — I10 Essential (primary) hypertension: Secondary | ICD-10-CM | POA: Diagnosis not present

## 2017-09-22 DIAGNOSIS — L97212 Non-pressure chronic ulcer of right calf with fat layer exposed: Secondary | ICD-10-CM | POA: Diagnosis not present

## 2017-09-22 DIAGNOSIS — S81001A Unspecified open wound, right knee, initial encounter: Secondary | ICD-10-CM | POA: Diagnosis not present

## 2017-09-22 DIAGNOSIS — L97219 Non-pressure chronic ulcer of right calf with unspecified severity: Secondary | ICD-10-CM | POA: Diagnosis present

## 2017-09-22 DIAGNOSIS — X58XXXA Exposure to other specified factors, initial encounter: Secondary | ICD-10-CM | POA: Diagnosis not present

## 2017-09-22 DIAGNOSIS — Z86718 Personal history of other venous thrombosis and embolism: Secondary | ICD-10-CM | POA: Diagnosis not present

## 2017-09-22 DIAGNOSIS — I890 Lymphedema, not elsewhere classified: Secondary | ICD-10-CM | POA: Insufficient documentation

## 2017-09-29 DIAGNOSIS — L97212 Non-pressure chronic ulcer of right calf with fat layer exposed: Secondary | ICD-10-CM | POA: Diagnosis not present

## 2017-10-08 DIAGNOSIS — L97212 Non-pressure chronic ulcer of right calf with fat layer exposed: Secondary | ICD-10-CM | POA: Diagnosis not present

## 2017-10-08 MED FILL — TRIUMEQ 600-50-300 MG TABS: 600-50-300 | 30 days supply | Qty: 30 | Fill #4

## 2017-10-15 DIAGNOSIS — L97212 Non-pressure chronic ulcer of right calf with fat layer exposed: Secondary | ICD-10-CM | POA: Diagnosis not present

## 2017-10-21 ENCOUNTER — Encounter (HOSPITAL_BASED_OUTPATIENT_CLINIC_OR_DEPARTMENT_OTHER): Payer: Medicaid Other | Attending: Internal Medicine

## 2017-10-21 DIAGNOSIS — Z21 Asymptomatic human immunodeficiency virus [HIV] infection status: Secondary | ICD-10-CM | POA: Insufficient documentation

## 2017-10-21 DIAGNOSIS — Z86718 Personal history of other venous thrombosis and embolism: Secondary | ICD-10-CM | POA: Insufficient documentation

## 2017-10-21 DIAGNOSIS — I890 Lymphedema, not elsewhere classified: Secondary | ICD-10-CM | POA: Insufficient documentation

## 2017-10-21 DIAGNOSIS — I10 Essential (primary) hypertension: Secondary | ICD-10-CM | POA: Insufficient documentation

## 2017-10-21 DIAGNOSIS — L97812 Non-pressure chronic ulcer of other part of right lower leg with fat layer exposed: Secondary | ICD-10-CM | POA: Insufficient documentation

## 2017-10-22 DIAGNOSIS — L97812 Non-pressure chronic ulcer of other part of right lower leg with fat layer exposed: Secondary | ICD-10-CM | POA: Diagnosis not present

## 2017-10-22 DIAGNOSIS — Z86718 Personal history of other venous thrombosis and embolism: Secondary | ICD-10-CM | POA: Diagnosis not present

## 2017-10-22 DIAGNOSIS — I890 Lymphedema, not elsewhere classified: Secondary | ICD-10-CM | POA: Diagnosis not present

## 2017-10-22 DIAGNOSIS — I10 Essential (primary) hypertension: Secondary | ICD-10-CM | POA: Diagnosis not present

## 2017-10-22 DIAGNOSIS — Z21 Asymptomatic human immunodeficiency virus [HIV] infection status: Secondary | ICD-10-CM | POA: Diagnosis not present

## 2017-11-01 MED FILL — TRIUMEQ 600-50-300 MG TABS: 600-50-300 | 30 days supply | Qty: 30 | Fill #5

## 2017-11-02 DIAGNOSIS — L97812 Non-pressure chronic ulcer of other part of right lower leg with fat layer exposed: Secondary | ICD-10-CM | POA: Diagnosis not present

## 2017-11-09 DIAGNOSIS — L97812 Non-pressure chronic ulcer of other part of right lower leg with fat layer exposed: Secondary | ICD-10-CM | POA: Diagnosis not present

## 2017-11-18 DIAGNOSIS — L97812 Non-pressure chronic ulcer of other part of right lower leg with fat layer exposed: Secondary | ICD-10-CM | POA: Diagnosis not present

## 2017-11-19 ENCOUNTER — Telehealth: Payer: Self-pay | Admitting: Pharmacist

## 2017-11-19 ENCOUNTER — Other Ambulatory Visit: Payer: Self-pay | Admitting: Pharmacist

## 2017-11-19 NOTE — Telephone Encounter (Signed)
Patient doing well on Triumeq - reports no missed doses and no side effects. Reminded him of his appointment for labs here on Monday and his follow-up appointment with [name] on 12/17.

## 2017-11-22 ENCOUNTER — Other Ambulatory Visit: Payer: Medicaid Other

## 2017-11-22 DIAGNOSIS — Z113 Encounter for screening for infections with a predominantly sexual mode of transmission: Secondary | ICD-10-CM

## 2017-11-22 DIAGNOSIS — B20 Human immunodeficiency virus [HIV] disease: Secondary | ICD-10-CM

## 2017-11-22 DIAGNOSIS — Z79899 Other long term (current) drug therapy: Secondary | ICD-10-CM

## 2017-11-23 LAB — T-HELPER CELL (CD4) - (RCID CLINIC ONLY)
CD4 % Helper T Cell: 40 % (ref 33–55)
CD4 T Cell Abs: 710 /uL (ref 400–2700)

## 2017-11-24 LAB — HIV-1 RNA QUANT-NO REFLEX-BLD
HIV 1 RNA Quant: <20 copies/mL
HIV-1 RNA Quant, Log: <1.3 Log copies/mL

## 2017-11-25 ENCOUNTER — Encounter (HOSPITAL_BASED_OUTPATIENT_CLINIC_OR_DEPARTMENT_OTHER): Payer: Medicaid Other | Attending: Internal Medicine

## 2017-11-25 DIAGNOSIS — L97112 Non-pressure chronic ulcer of right thigh with fat layer exposed: Secondary | ICD-10-CM | POA: Insufficient documentation

## 2017-11-25 DIAGNOSIS — L97811 Non-pressure chronic ulcer of other part of right lower leg limited to breakdown of skin: Secondary | ICD-10-CM | POA: Insufficient documentation

## 2017-11-25 DIAGNOSIS — I890 Lymphedema, not elsewhere classified: Secondary | ICD-10-CM | POA: Insufficient documentation

## 2017-11-25 DIAGNOSIS — F419 Anxiety disorder, unspecified: Secondary | ICD-10-CM | POA: Insufficient documentation

## 2017-11-25 DIAGNOSIS — I10 Essential (primary) hypertension: Secondary | ICD-10-CM | POA: Insufficient documentation

## 2017-11-25 DIAGNOSIS — B20 Human immunodeficiency virus [HIV] disease: Secondary | ICD-10-CM | POA: Insufficient documentation

## 2017-11-25 LAB — CBC
HCT: 36.1 % — ABNORMAL LOW (ref 38.5–50.0)
Hemoglobin: 11.4 g/dL — ABNORMAL LOW (ref 13.2–17.1)
MCH: 25.5 pg — ABNORMAL LOW (ref 27.0–33.0)
MCHC: 31.6 g/dL — ABNORMAL LOW (ref 32.0–36.0)
MCV: 80.8 fL (ref 80.0–100.0)
MPV: 11.5 fL (ref 7.5–12.5)
PLATELETS: 318 10*3/uL (ref 140–400)
RBC: 4.47 10*6/uL (ref 4.20–5.80)
RDW: 12.7 % (ref 11.0–15.0)
WBC: 8 10*3/uL (ref 3.8–10.8)

## 2017-11-25 LAB — COMPLETE METABOLIC PANEL WITH GFR
AG RATIO: 1 (calc) (ref 1.0–2.5)
ALBUMIN MSPROF: 3.8 g/dL (ref 3.6–5.1)
ALT: 10 U/L (ref 9–46)
AST: 9 U/L — AB (ref 10–40)
Alkaline phosphatase (APISO): 82 U/L (ref 40–115)
BUN: 10 mg/dL (ref 7–25)
CALCIUM: 9 mg/dL (ref 8.6–10.3)
CO2: 27 mmol/L (ref 20–32)
CREATININE: 0.87 mg/dL (ref 0.60–1.35)
Chloride: 101 mmol/L (ref 98–110)
GFR, EST NON AFRICAN AMERICAN: 109 mL/min/{1.73_m2} (ref >=60)
GFR, Est African American: 127 mL/min/{1.73_m2} (ref >=60)
GLOBULIN: 3.7 g/dL (ref 1.9–3.7)
Glucose, Bld: 97 mg/dL (ref 65–99)
Potassium: 4.4 mmol/L (ref 3.5–5.3)
SODIUM: 136 mmol/L (ref 135–146)
TOTAL PROTEIN: 7.5 g/dL (ref 6.1–8.1)
Total Bilirubin: 0.2 mg/dL (ref 0.2–1.2)

## 2017-11-25 LAB — LIPID PANEL
CHOL/HDL RATIO: 3.4 (calc) (ref <5.0)
CHOLESTEROL: 163 mg/dL (ref <200)
HDL: 48 mg/dL (ref >40)
LDL CHOLESTEROL (CALC): 98 mg/dL
Non-HDL Cholesterol (Calc): 115 mg/dL (calc) (ref <130)
TRIGLYCERIDES: 82 mg/dL (ref <150)

## 2017-11-25 LAB — RPR TITER: RPR Titer: 1:1 {titer} — ABNORMAL HIGH

## 2017-11-25 LAB — FLUORESCENT TREPONEMAL AB(FTA)-IGG-BLD: FLUORESCENT TREPONEMAL ABS: NONREACTIVE

## 2017-11-25 LAB — RPR: RPR Ser Ql: REACTIVE — AB

## 2017-12-01 ENCOUNTER — Other Ambulatory Visit: Payer: Self-pay | Admitting: Infectious Diseases

## 2017-12-01 DIAGNOSIS — B20 Human immunodeficiency virus [HIV] disease: Secondary | ICD-10-CM

## 2017-12-01 MED FILL — TRIUMEQ 600-50-300 MG TABS: 600-50-300 | 30 days supply | Qty: 30 | Fill #0

## 2017-12-02 ENCOUNTER — Other Ambulatory Visit (HOSPITAL_COMMUNITY)
Admission: RE | Admit: 2017-12-02 | Discharge: 2017-12-02 | Disposition: A | Discharge status: Home / Self Care | Payer: Medicaid Other | Source: Other Acute Inpatient Hospital | Attending: Internal Medicine | Admitting: Internal Medicine

## 2017-12-02 DIAGNOSIS — I10 Essential (primary) hypertension: Secondary | ICD-10-CM | POA: Diagnosis not present

## 2017-12-02 DIAGNOSIS — X58XXXA Exposure to other specified factors, initial encounter: Secondary | ICD-10-CM | POA: Diagnosis not present

## 2017-12-02 DIAGNOSIS — S81801A Unspecified open wound, right lower leg, initial encounter: Secondary | ICD-10-CM | POA: Insufficient documentation

## 2017-12-02 DIAGNOSIS — L97811 Non-pressure chronic ulcer of other part of right lower leg limited to breakdown of skin: Secondary | ICD-10-CM | POA: Diagnosis present

## 2017-12-02 DIAGNOSIS — F419 Anxiety disorder, unspecified: Secondary | ICD-10-CM | POA: Diagnosis not present

## 2017-12-02 DIAGNOSIS — B20 Human immunodeficiency virus [HIV] disease: Secondary | ICD-10-CM | POA: Diagnosis not present

## 2017-12-02 DIAGNOSIS — L97112 Non-pressure chronic ulcer of right thigh with fat layer exposed: Secondary | ICD-10-CM | POA: Diagnosis not present

## 2017-12-02 DIAGNOSIS — I890 Lymphedema, not elsewhere classified: Secondary | ICD-10-CM | POA: Diagnosis not present

## 2017-12-05 LAB — AEROBIC CULTURE W GRAM STAIN (SUPERFICIAL SPECIMEN)

## 2017-12-05 LAB — AEROBIC CULTURE  (SUPERFICIAL SPECIMEN)

## 2017-12-06 ENCOUNTER — Encounter: Payer: Self-pay | Admitting: Infectious Diseases

## 2017-12-06 ENCOUNTER — Other Ambulatory Visit: Payer: Self-pay

## 2017-12-06 ENCOUNTER — Ambulatory Visit (INDEPENDENT_AMBULATORY_CARE_PROVIDER_SITE_OTHER): Payer: Medicaid Other | Admitting: Infectious Diseases

## 2017-12-06 VITALS — BP 130/79 | HR 72 | Temp 98.3°F | Ht 67.0 in | Wt 376.0 lb

## 2017-12-06 DIAGNOSIS — Z79899 Other long term (current) drug therapy: Secondary | ICD-10-CM

## 2017-12-06 DIAGNOSIS — Z23 Encounter for immunization: Secondary | ICD-10-CM | POA: Diagnosis not present

## 2017-12-06 DIAGNOSIS — Z113 Encounter for screening for infections with a predominantly sexual mode of transmission: Secondary | ICD-10-CM

## 2017-12-06 DIAGNOSIS — B20 Human immunodeficiency virus [HIV] disease: Secondary | ICD-10-CM

## 2017-12-06 MED ORDER — LORCASERIN HCL ER 20 MG PO TB24: 20.0000 mg | ORAL_TABLET | Freq: Every day | ORAL | 2 refills | Status: DC

## 2017-12-06 NOTE — Assessment & Plan Note (Addendum)
Spoke with him about egrifta. He does not want injections.  He would like to try adipex (phentiramine) as his mom took this. This is not a good choice with his cardiac history (prev arrest).  Will try belviq (reecnt study listed by [employer] showed safety at 3 yrs in pts with CAD).
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
## 2017-12-06 NOTE — Assessment & Plan Note (Signed)
He is doing well Refuses flu shot Last Hep B today Offered/refused condoms.  Will see him back in 3 months to assess his wt loss.

## 2017-12-06 NOTE — Addendum Note (Signed)
Addended by: Andree CossHOWELL, Maila Dukes M on: 12/06/2017 04:01 PM   Modules accepted: Orders

## 2017-12-07 ENCOUNTER — Telehealth: Payer: Self-pay

## 2017-12-07 NOTE — Telephone Encounter (Signed)
Messge on voicemail:  Having problems getting medications not covered by

## 2017-12-15 ENCOUNTER — Telehealth: Payer: Self-pay

## 2017-12-15 NOTE — Telephone Encounter (Signed)
initiated the process for Prior Authorization on Belviq XR 20 mg tablets was informed by [place] [employer] that the medication is not covered under Medicaid.  Lorenso CourierJose L Maldonado, [place]

## 2017-12-28 ENCOUNTER — Encounter (HOSPITAL_BASED_OUTPATIENT_CLINIC_OR_DEPARTMENT_OTHER): Payer: Medicaid Other | Attending: Internal Medicine

## 2017-12-29 ENCOUNTER — Other Ambulatory Visit: Payer: Self-pay | Admitting: Pharmacist

## 2018-01-10 MED FILL — TRIUMEQ 600-50-300 MG TABS: 600-50-300 | 30 days supply | Qty: 30 | Fill #1

## 2018-02-01 ENCOUNTER — Other Ambulatory Visit: Payer: Self-pay | Admitting: Infectious Diseases

## 2018-02-01 DIAGNOSIS — B20 Human immunodeficiency virus [HIV] disease: Secondary | ICD-10-CM

## 2018-02-01 NOTE — Telephone Encounter (Signed)
Patient called about his Belviq medication. This was rejected by medicaid in December 2018. Please advise if he should have any referrals (nutrition, bariatric), as Medicaid does not cover this. Andree CossHowell, Michelle M, RN

## 2018-02-02 NOTE — Telephone Encounter (Signed)
Can we send him to nutrition? thanks

## 2018-02-02 NOTE — Addendum Note (Signed)
Addended by: Andree CossHOWELL, Jonanthony Nahar M on: 02/02/2018 05:15 PM   Modules accepted: Orders

## 2018-02-02 NOTE — Telephone Encounter (Signed)
Please cosign order. THanks!

## 2018-02-07 MED FILL — TRIUMEQ 600-50-300 MG TABS: 600-50-300 | 30 days supply | Qty: 30 | Fill #0

## 2018-02-10 ENCOUNTER — Ambulatory Visit: Payer: Medicaid Other | Admitting: Registered"

## 2018-03-07 ENCOUNTER — Ambulatory Visit: Payer: Medicaid Other | Admitting: Infectious Diseases

## 2018-03-08 MED FILL — TRIUMEQ 600-50-300 MG TABS: 600-50-300 | 30 days supply | Qty: 30 | Fill #1

## 2018-03-23 ENCOUNTER — Ambulatory Visit (INDEPENDENT_AMBULATORY_CARE_PROVIDER_SITE_OTHER): Payer: Medicaid Other | Admitting: Infectious Diseases

## 2018-03-23 ENCOUNTER — Encounter: Payer: Self-pay | Admitting: Infectious Diseases

## 2018-03-23 VITALS — BP 151/90 | HR 75 | Temp 98.0°F | Ht 67.0 in | Wt 373.0 lb

## 2018-03-23 DIAGNOSIS — B20 Human immunodeficiency virus [HIV] disease: Secondary | ICD-10-CM

## 2018-03-23 DIAGNOSIS — Z113 Encounter for screening for infections with a predominantly sexual mode of transmission: Secondary | ICD-10-CM | POA: Diagnosis not present

## 2018-03-23 DIAGNOSIS — L03115 Cellulitis of right lower limb: Secondary | ICD-10-CM | POA: Diagnosis not present

## 2018-03-23 DIAGNOSIS — I469 Cardiac arrest, cause unspecified: Secondary | ICD-10-CM

## 2018-03-23 MED ORDER — ORLISTAT 120 MG PO CAPS: 120.0000 mg | ORAL_CAPSULE | Freq: Three times a day (TID) | ORAL | 3 refills | Status: DC

## 2018-03-23 MED ORDER — DOXYCYCLINE HYCLATE 100 MG PO TABS: 100.0000 mg | ORAL_TABLET | Freq: Two times a day (BID) | ORAL | 0 refills | Status: DC

## 2018-03-23 NOTE — Assessment & Plan Note (Addendum)
Will start him on orlistat Counseled on side effects.  Offered to have him seen by surgery, he defers.  rtc in 3 months

## 2018-03-23 NOTE — Assessment & Plan Note (Signed)
Will refill his doxy 
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
## 2018-03-23 NOTE — Assessment & Plan Note (Signed)
He appears to be doing well Will continue his triumeq.  Does not want flu shot.  Wishes to defer labs til next visit.  rtc in 3 months.

## 2018-03-23 NOTE — Assessment & Plan Note (Signed)
Not sure I can "clear" him for some of the wt loss drugs with this hx.  Consider CV re-eval if need to try different wt loss drugs.

## 2018-03-29 ENCOUNTER — Other Ambulatory Visit: Payer: Self-pay | Admitting: Infectious Diseases

## 2018-03-29 DIAGNOSIS — B20 Human immunodeficiency virus [HIV] disease: Secondary | ICD-10-CM

## 2018-04-15 MED FILL — TRIUMEQ 600-50-300 MG TABS: 600-50-300 | 30 days supply | Qty: 30 | Fill #0

## 2018-04-20 ENCOUNTER — Telehealth: Payer: Self-pay

## 2018-04-20 NOTE — Telephone Encounter (Signed)
Attempted to initiate pa for Xenical 120 mg capsule. Was told by PA rep that the medicine would not be covered since Medicaid does not cover any weight loss meds. Will inform MD and see what he would like to do.  Lorenso Courier, [place]

## 2018-05-18 MED FILL — TRIUMEQ 600-50-300 MG TABS: 600-50-300 | 30 days supply | Qty: 30 | Fill #1

## 2018-06-08 ENCOUNTER — Other Ambulatory Visit: Payer: Self-pay | Admitting: Infectious Diseases

## 2018-06-08 DIAGNOSIS — B20 Human immunodeficiency virus [HIV] disease: Secondary | ICD-10-CM

## 2018-06-22 ENCOUNTER — Other Ambulatory Visit: Payer: Medicaid Other

## 2018-07-04 ENCOUNTER — Telehealth: Payer: Self-pay | Admitting: Pharmacist

## 2018-07-04 ENCOUNTER — Ambulatory Visit (INDEPENDENT_AMBULATORY_CARE_PROVIDER_SITE_OTHER): Payer: Self-pay | Admitting: Pharmacist

## 2018-07-04 DIAGNOSIS — B20 Human immunodeficiency virus [HIV] disease: Secondary | ICD-10-CM

## 2018-07-04 MED FILL — TRIUMEQ 600-50-300 MG TABS: 600-50-300 | 30 days supply | Qty: 30 | Fill #0

## 2018-07-04 NOTE — Progress Notes (Signed)
Patient lost Medicaid insurance - I was able to get him a 30 day immediate fill from [facility] for his Triumeq. See telephone note above.

## 2018-07-04 NOTE — Telephone Encounter (Signed)
Patient unexpectedly lost his Medicaid. He came into the clinic today to get help as he has one Triumeq pill left for this month.  I was able to [facility]  [facility] to sort out the issue.   ID: [id number] Group: [id number] BI: [id number] PCN: [id number]

## 2018-07-06 ENCOUNTER — Encounter: Payer: Medicaid Other | Admitting: Infectious Diseases

## 2018-08-02 ENCOUNTER — Telehealth: Payer: Self-pay | Admitting: Pharmacy Technician

## 2018-08-02 NOTE — Telephone Encounter (Signed)
Mr. Alanson PulsCreasey's Medicaid is inactive.   He needs to reactivate to get medication [facility].  He says he has a new Medicaid number and will call it in to me.  He states it is through SSI.

## 2018-08-09 ENCOUNTER — Ambulatory Visit: Payer: Self-pay | Admitting: Pharmacy Technician

## 2018-08-09 ENCOUNTER — Other Ambulatory Visit: Payer: Self-pay | Admitting: Pharmacist Clinician (PhC)/ Clinical Pharmacy Specialist

## 2018-08-09 MED ORDER — BICTEGRAVIR-EMTRICITAB-TENOFOV 50-200-25 MG PO TABS: 1.0000 | ORAL_TABLET | Freq: Every day | ORAL | 6 refills | Status: DC

## 2018-08-09 MED FILL — BIKTARVY 50-200-25 MG TABS: 50-200-25 | 30 days supply | Qty: 30 | Fill #0

## 2018-08-09 NOTE — Progress Notes (Addendum)
Pt has issue with Medicaid approval right now. [facility] would not allow him to get more then one refill unless he submit a new application. [facility]. He will go downtown to sort out the Medicaid issue. He has SSI so he automatically qualify for Medicaid. Made him an appt for labs then [name] in Oct.

## 2018-08-10 ENCOUNTER — Telehealth: Payer: Self-pay | Admitting: Pharmacist Clinician (PhC)/ Clinical Pharmacy Specialist

## 2018-08-10 NOTE — Telephone Encounter (Signed)
Chart review>>media.  His Biktarvy is approved till 8/20 while getting medicaid sorted out.

## 2018-09-14 MED FILL — BIKTARVY 50-200-25 MG TABS: 50-200-25 | 30 days supply | Qty: 30 | Fill #1

## 2018-10-02 IMAGING — CT CT TIBIA FIBULA *R* W/ CM
3 series · 10 of 33 positions shown, 11 images · IV contrast (agent unspecified)
Comparison: None.

CONTRAST:  100mL TGXZCV-WTT IOPAMIDOL (TGXZCV-WTT) INJECTION 61%

CLINICAL DATA: Superior right knee joint pain. History of
cellulitis of the tibia and fibula.

EXAM:
CT OF THE LOWER RIGHT EXTREMITY WITH CONTRAST, CT OF THE RIGHT KNEE
WITH CONTRAST
TECHNIQUE: Multidetector CT imaging of the lower right extremity was performed
according to the standard protocol following intravenous contrast
administration.

[Series 4: tib fib axial st · axial · 0.53mm/px · z∈[-432,-214]mm · 2 of 159 slices shown, 3 images]
[im 49/159  soft-tissue]
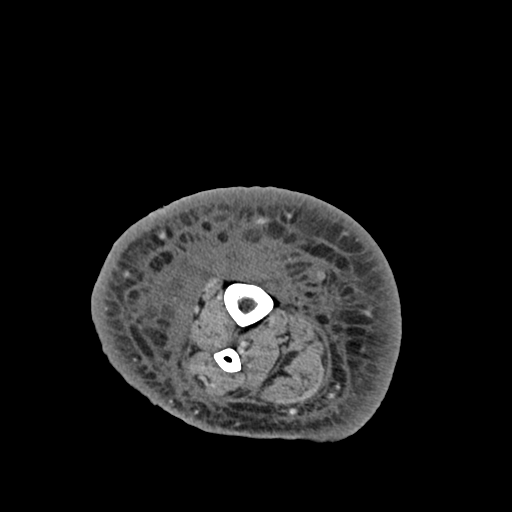
[im 49/159  bone]
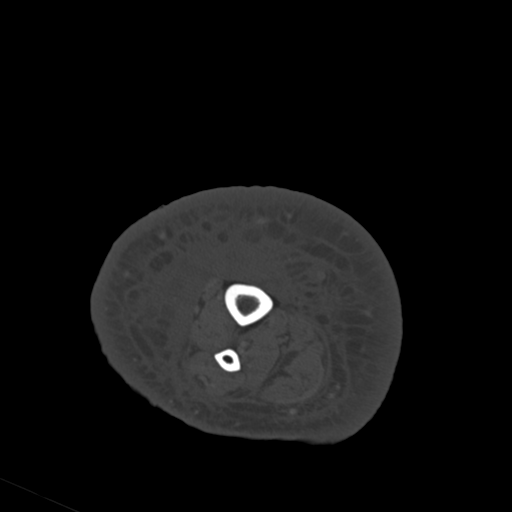
[im 122/159  bone]
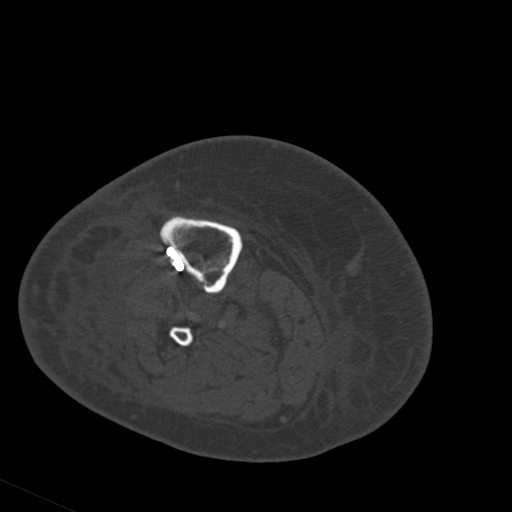

[Series 9: cor tib fib st · coronal · 0.54mm/px · 3 of 102 slices shown]
[im 21/102  bone]
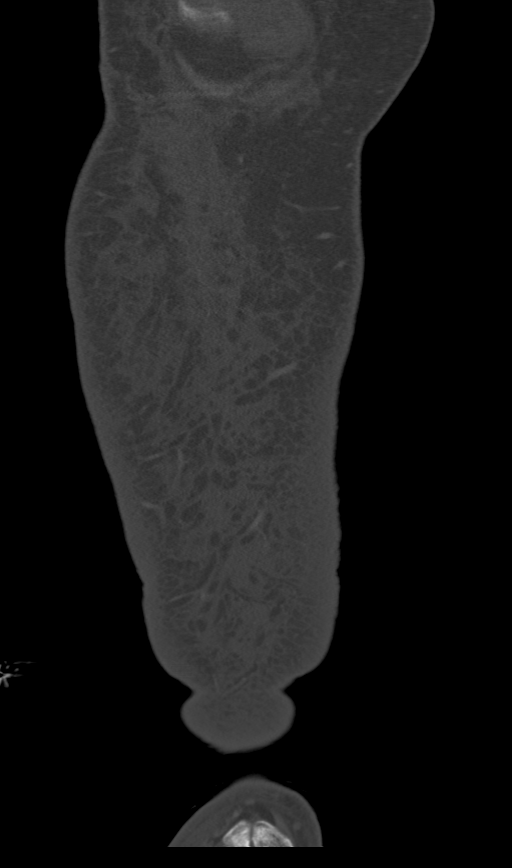
[im 41/102  bone]
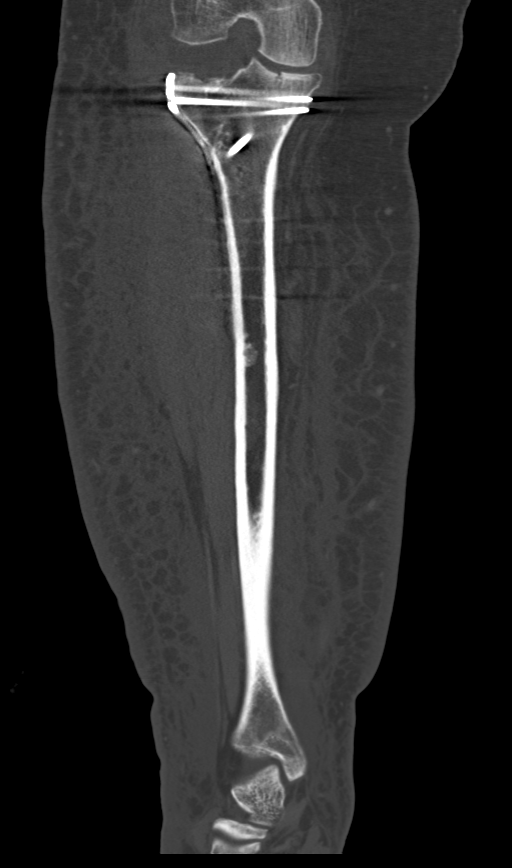
[im 61/102  bone]
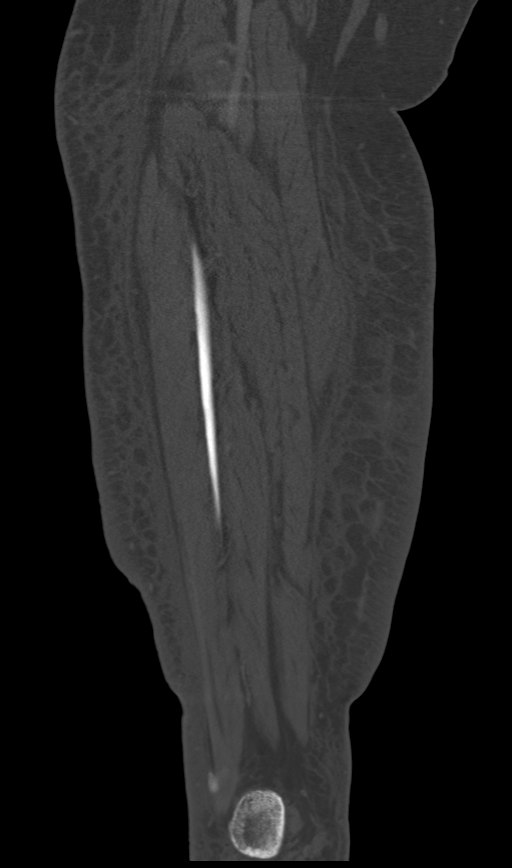

[Series 11: sag tib fib st · sagittal · 0.40mm/px · 5 of 116 slices shown]
[im 39/116  bone]
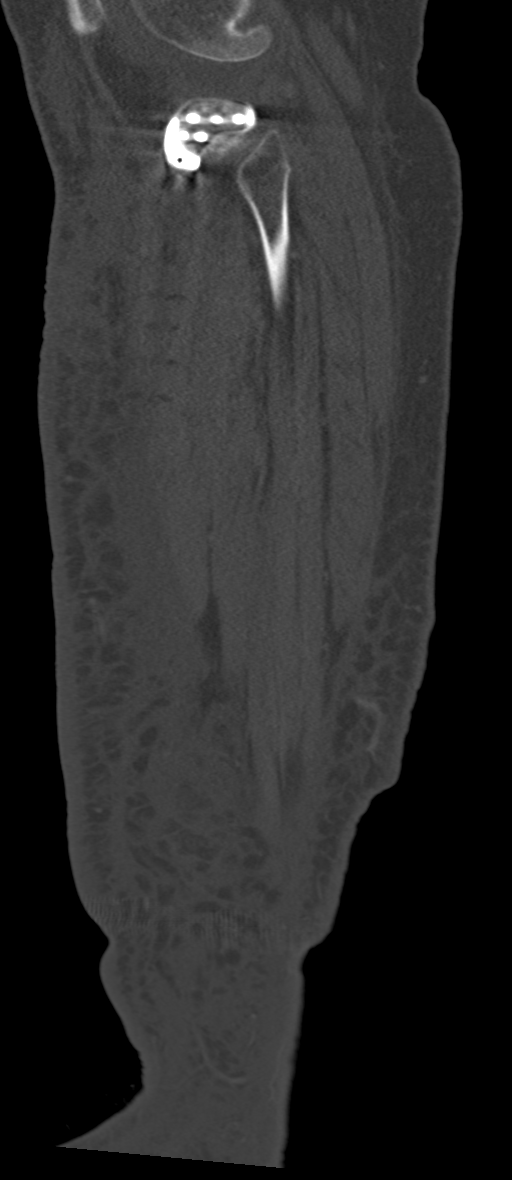
[im 48/116  bone]
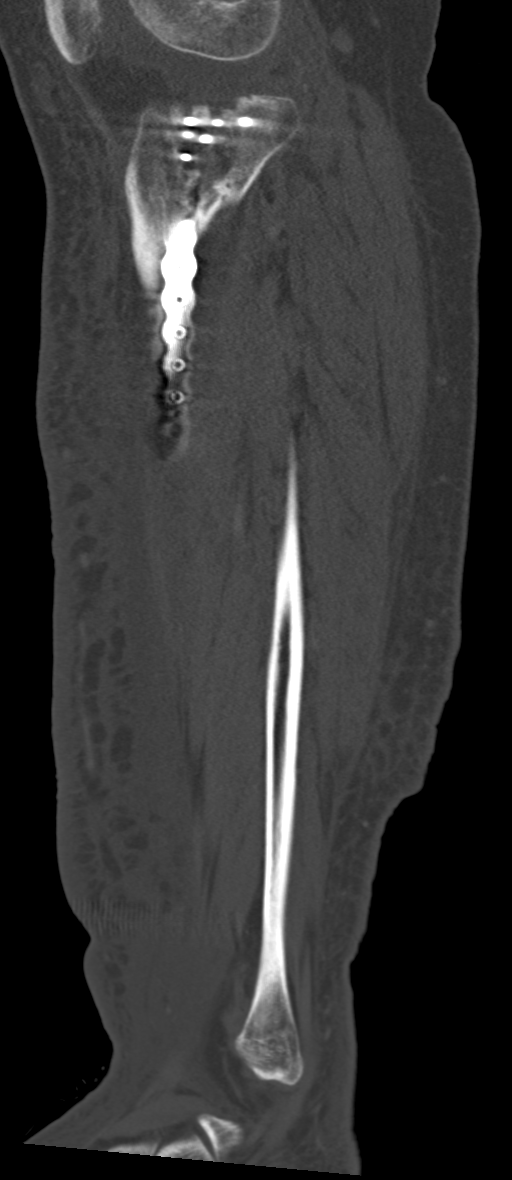
[im 58/116  bone]
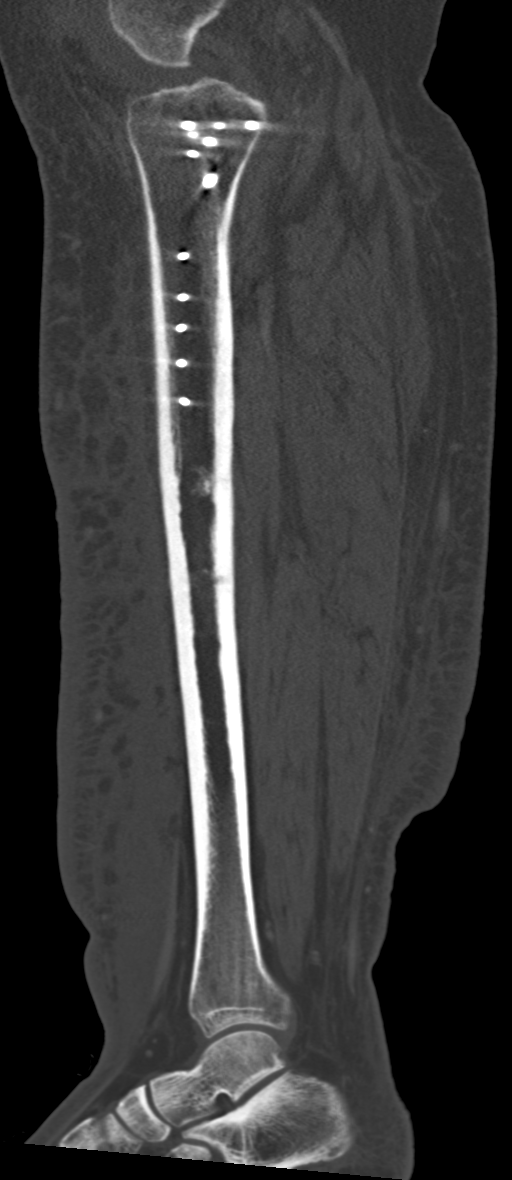
[im 68/116  bone]
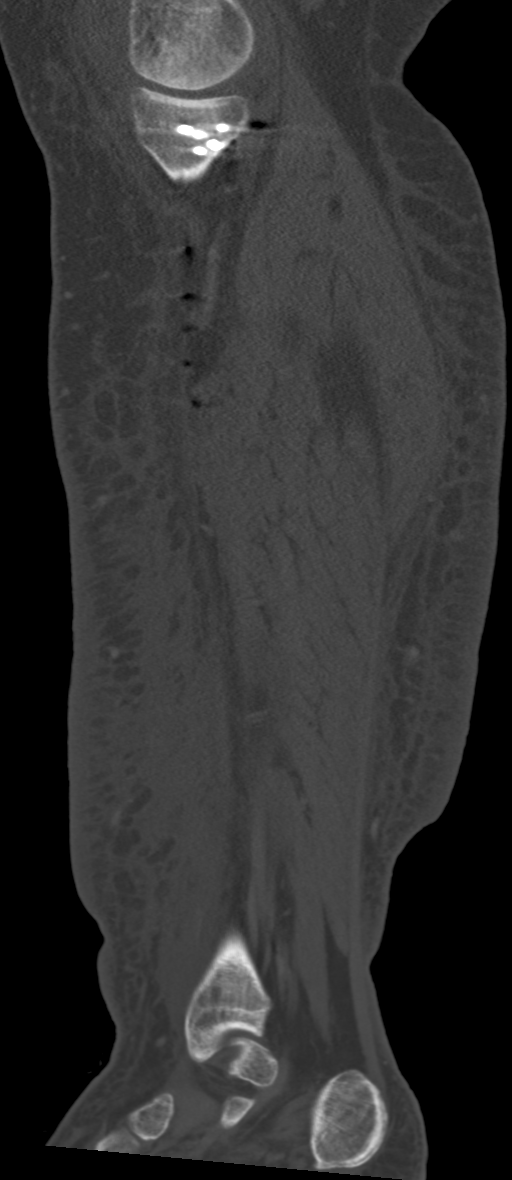
[im 77/116  bone]
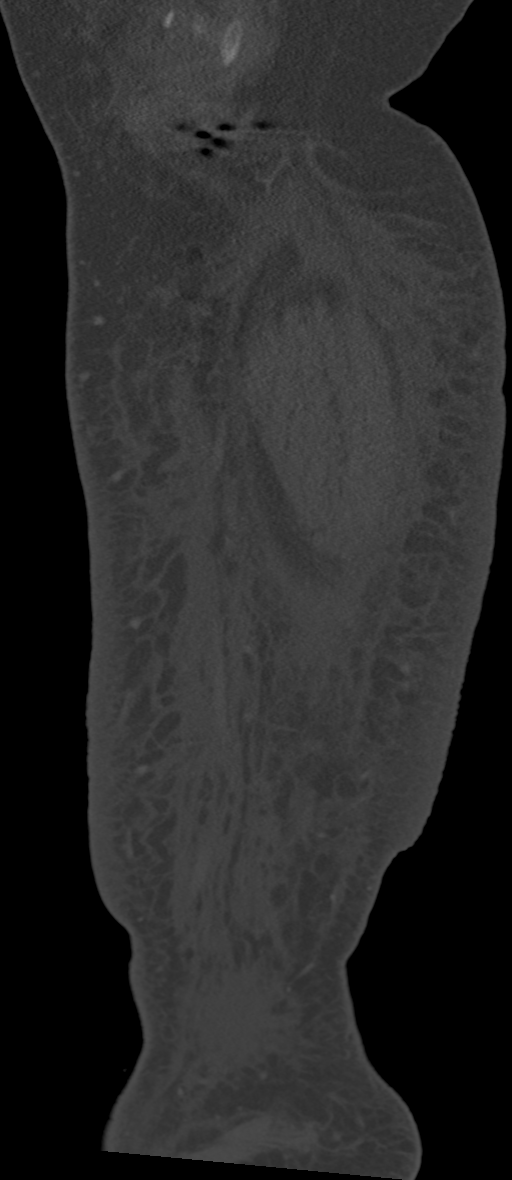

[10 of 33 positions shown; findings below may reference images not displayed]

FINDINGS: Bones/Joint/Cartilage

Lateral plate and screw fixation hardware is noted with elevation of
previously noted lateral and medial tibial plateau fractures and
demonstrated partial osseous union of the tibial plateau fracture
fragments. No new fracture bone destruction is noted. No hardware
failure or loosening noted. No acute fracture lucencies are
identified.

Ligaments

Suboptimally assessed by CT.

Muscles and Tendons

No mild muscle atrophy nor intramuscular hematoma. Intact extensor
mechanism tendons.

Soft tissues

Moderate to large joint effusion without lipohemarthrosis.
Cellulitic soft tissue induration about the knee more so antral
laterally but also medially further caudad from the knee joint. No
focal fluid collection.
IMPRESSION: 1. Periarticular soft tissue induration consistent with cellulitis
with moderate to large joint effusion.
2. ORIF of the proximal tibia with elevation of previously noted
comminuted tibial plateau fractures. Some residual areas of
incomplete osseous union noted of the tibial plateau not unexpected
given the degree of comminution seen previously. No new fracture is
identified nor findings strongly suspicious for osteomyelitis.

## 2018-10-02 IMAGING — DX DG TIBIA/FIBULA 2V*R*
4 series · 4 of 4 positions shown · non-contrast
Comparison: 12/10/2015

CLINICAL DATA: Right lower leg pain and swelling today.  No injury.

EXAM:
RIGHT TIBIA AND FIBULA - 2 VIEW

[tibia ap (1 of 3)]
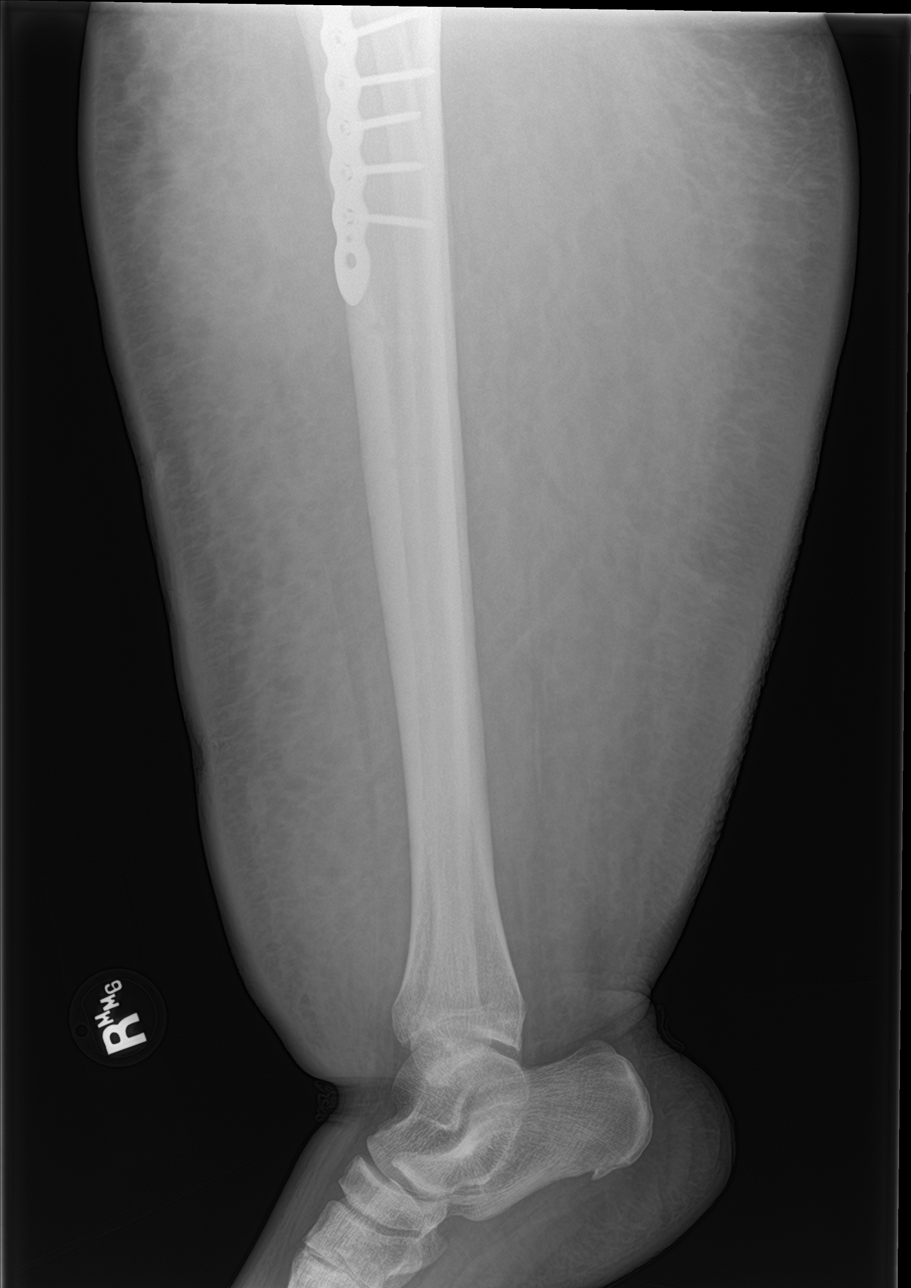

[tibia ap (2 of 3)]
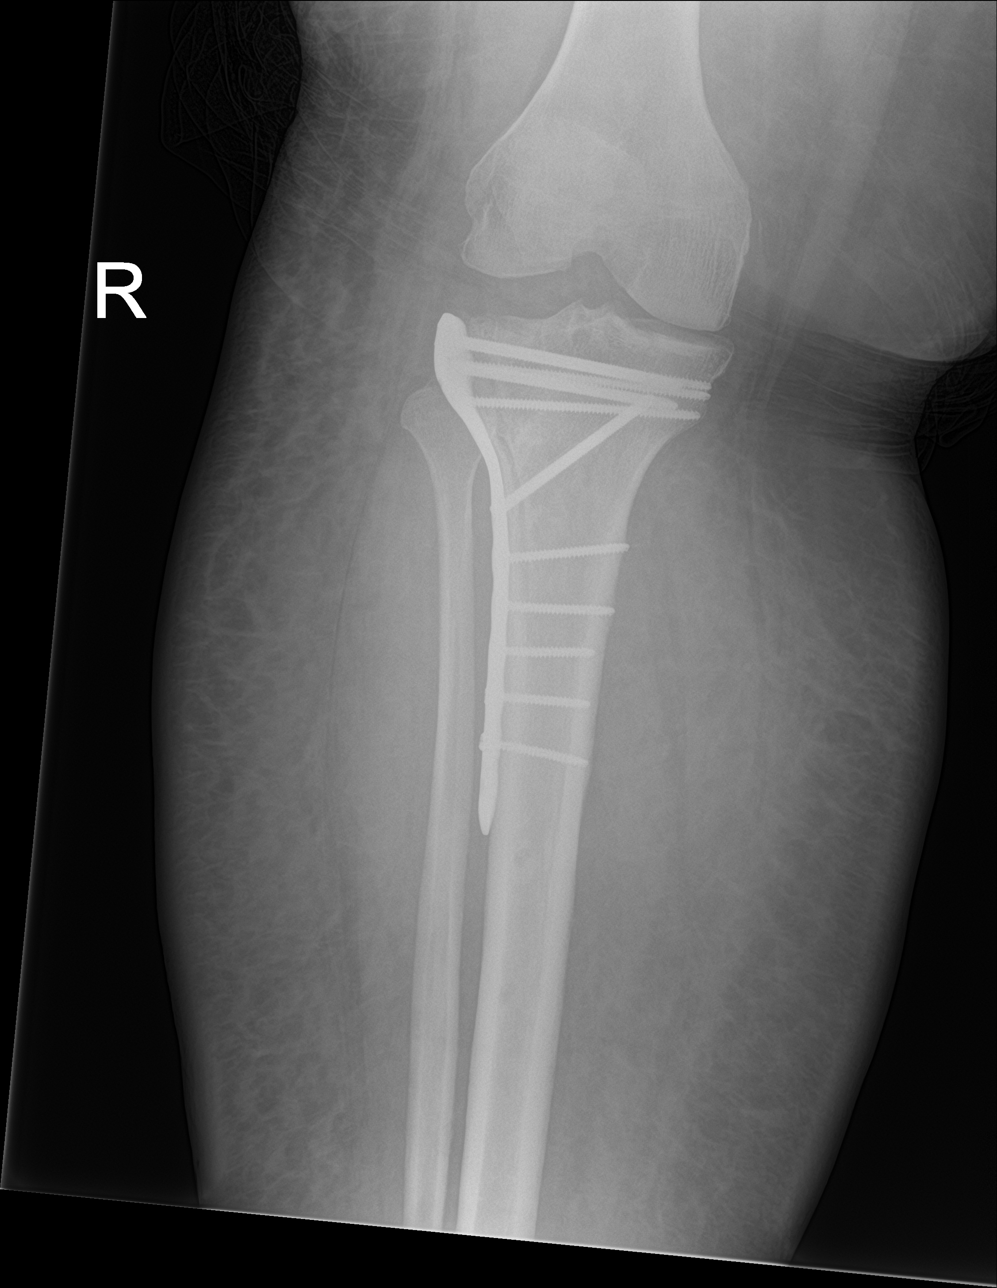

[tibia lat]
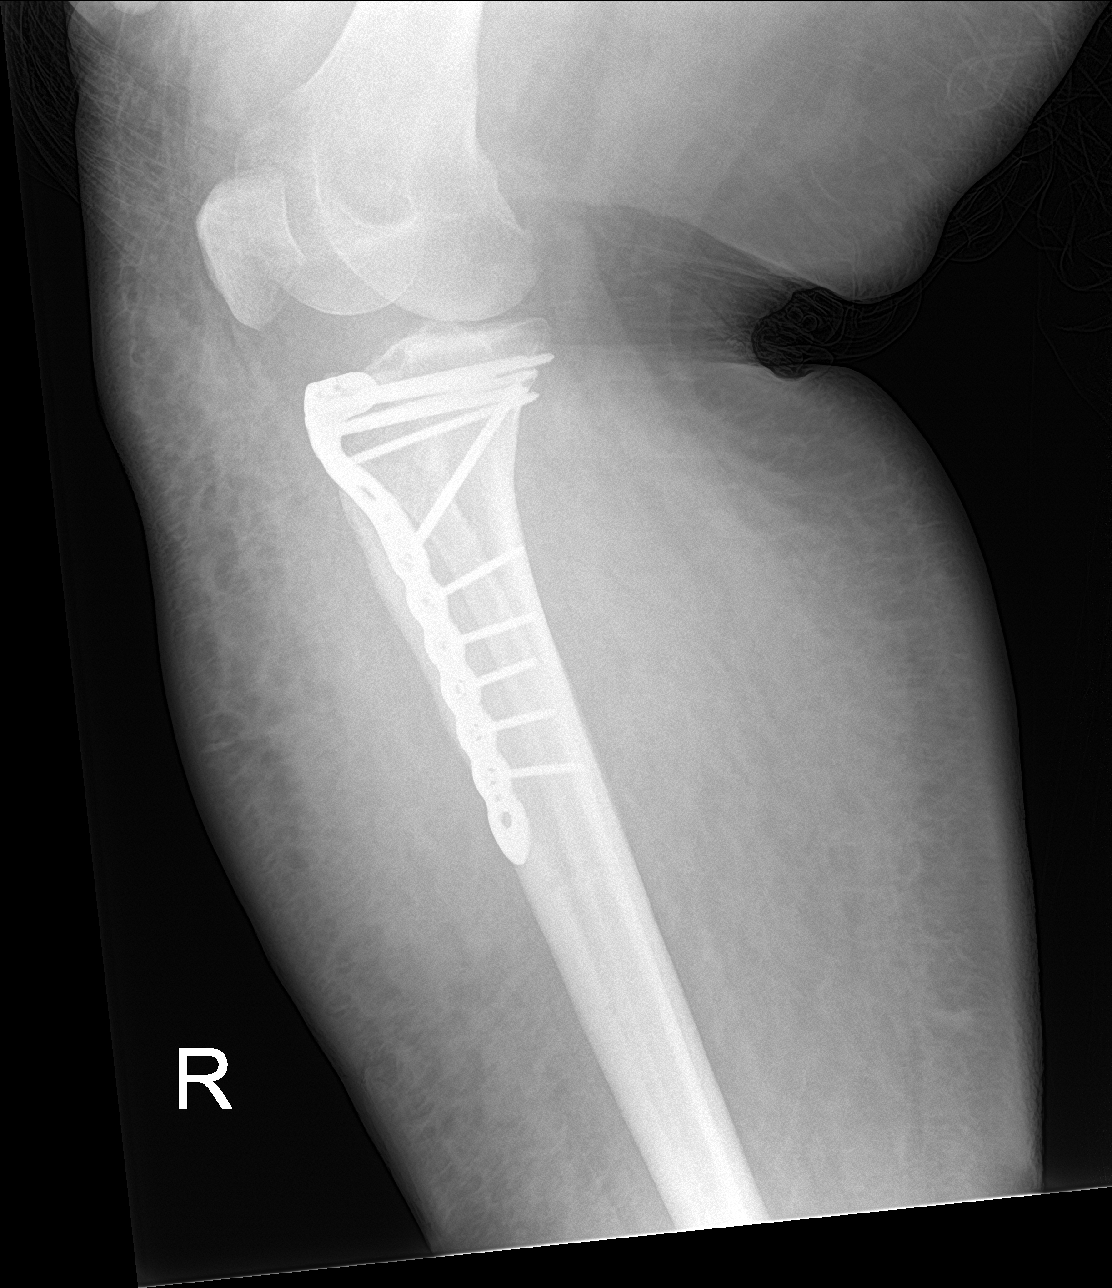

[tibia ap (3 of 3)]
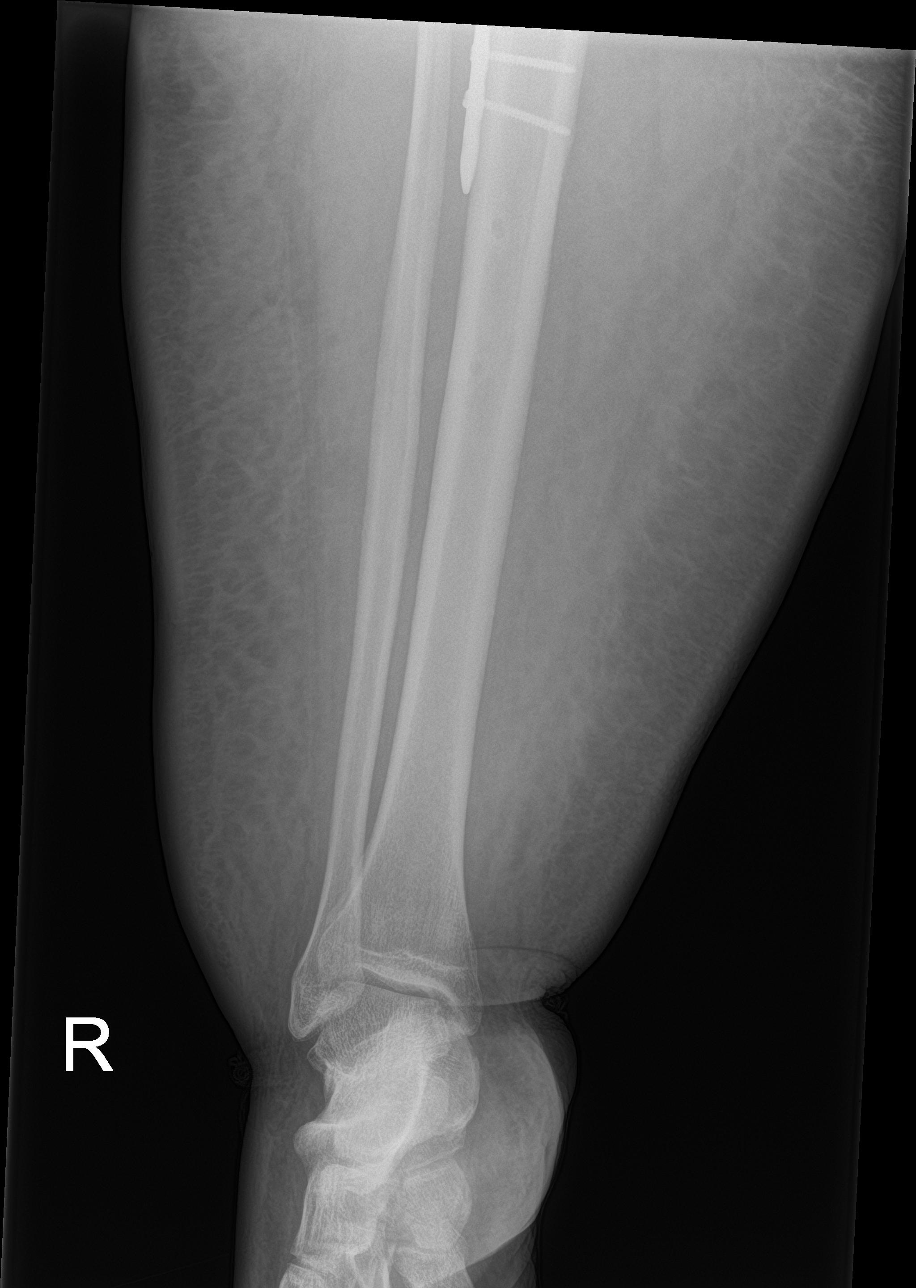

[4 of 4 positions shown; findings below may reference images not displayed]

FINDINGS: Postoperative changes with lateral plate and screw fixation of the
tibial metaphysis. Old pin tracts in the proximal tibial shaft.
Healed fracture lines are present. Mild residual deformity of the
tibial plateaus consistent with old fracture deformity. No evidence
of acute displaced fracture. No focal bone lesion or bone
destruction. There is diffuse soft tissue infiltration demonstrated
throughout the soft tissues of the right lower leg. This may
indicate edema, cellulitis, or other infiltrating process. No soft
tissue gas or soft tissue foreign bodies are demonstrated.
IMPRESSION: Postoperative internal fixation of the and healed fracture deformity
of the proximal right tibia. No acute fractures. Diffuse
infiltration or edema in the soft tissues of the right lower leg.

## 2018-10-10 ENCOUNTER — Other Ambulatory Visit: Payer: Self-pay

## 2018-10-13 MED FILL — BIKTARVY 50-200-25 MG TABS: 50-200-25 | 30 days supply | Qty: 30 | Fill #2

## 2018-10-17 ENCOUNTER — Ambulatory Visit: Payer: Self-pay | Admitting: Infectious Diseases

## 2018-11-11 ENCOUNTER — Ambulatory Visit (HOSPITAL_COMMUNITY)
Admission: EM | Admit: 2018-11-11 | Discharge: 2018-11-11 | Disposition: A | Discharge status: Home / Self Care | Payer: Medicaid Other | Attending: Family Medicine | Admitting: Family Medicine

## 2018-11-11 ENCOUNTER — Encounter (HOSPITAL_COMMUNITY): Payer: Self-pay | Admitting: Emergency Medicine

## 2018-11-11 DIAGNOSIS — L03115 Cellulitis of right lower limb: Secondary | ICD-10-CM

## 2018-11-11 DIAGNOSIS — R600 Localized edema: Secondary | ICD-10-CM | POA: Diagnosis not present

## 2018-11-11 DIAGNOSIS — R2241 Localized swelling, mass and lump, right lower limb: Secondary | ICD-10-CM

## 2018-11-11 MED ORDER — DOXYCYCLINE HYCLATE 100 MG PO TABS: 100.0000 mg | ORAL_TABLET | Freq: Two times a day (BID) | ORAL | 0 refills | Status: DC

## 2018-11-11 MED ORDER — FUROSEMIDE 20 MG PO TABS: 20.0000 mg | ORAL_TABLET | Freq: Every day | ORAL | 0 refills | Status: DC

## 2018-11-11 MED ORDER — MUPIROCIN 2 % EX OINT: 1.0000 "application " | TOPICAL_OINTMENT | Freq: Three times a day (TID) | CUTANEOUS | 1 refills | Status: DC

## 2018-11-11 MED ORDER — TRAMADOL HCL 50 MG PO TABS: 50.0000 mg | ORAL_TABLET | Freq: Four times a day (QID) | ORAL | 0 refills | Status: DC | PRN

## 2018-11-11 MED FILL — traMADol HCL 50 MG TABS: 50 | 3 days supply | Qty: 10 | Fill #0

## 2018-11-11 MED FILL — MUPIROCIN 2% OINTMENT: 2 | 20 days supply | Qty: 22 | Fill #0

## 2018-11-11 MED FILL — DOXYCYCLINE HYCLATE 100 MG: 100 | 10 days supply | Qty: 20 | Fill #0

## 2018-11-11 MED FILL — FUROSEMIDE 20 MG TABS: 20 | 10 days supply | Qty: 10 | Fill #0

## 2018-11-11 NOTE — ED Triage Notes (Signed)
Pt states he has a sore on the outer side of his R knee, states its oozing x2 weeks.

## 2018-11-11 NOTE — Discharge Instructions (Addendum)
Keep the right leg elevated as much as possible.
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
## 2018-11-23 MED FILL — BIKTARVY 50-200-25 MG TABS: 50-200-25 | 30 days supply | Qty: 30 | Fill #3

## 2018-12-19 MED FILL — BIKTARVY 50-200-25 MG TABS: 50-200-25 | 30 days supply | Qty: 30 | Fill #4

## 2018-12-23 ENCOUNTER — Emergency Department (HOSPITAL_COMMUNITY): Payer: Medicaid Other

## 2018-12-23 ENCOUNTER — Emergency Department (HOSPITAL_COMMUNITY)
Admission: EM | Admit: 2018-12-23 | Discharge: 2018-12-23 | Disposition: A | Discharge status: Home / Self Care | Payer: Medicaid Other | Attending: Emergency Medicine | Admitting: Emergency Medicine

## 2018-12-23 ENCOUNTER — Encounter (HOSPITAL_COMMUNITY): Payer: Self-pay

## 2018-12-23 ENCOUNTER — Other Ambulatory Visit: Payer: Self-pay

## 2018-12-23 DIAGNOSIS — M25561 Pain in right knee: Secondary | ICD-10-CM | POA: Diagnosis present

## 2018-12-23 DIAGNOSIS — Z79899 Other long term (current) drug therapy: Secondary | ICD-10-CM | POA: Insufficient documentation

## 2018-12-23 DIAGNOSIS — Z21 Asymptomatic human immunodeficiency virus [HIV] infection status: Secondary | ICD-10-CM | POA: Diagnosis not present

## 2018-12-23 DIAGNOSIS — L03115 Cellulitis of right lower limb: Secondary | ICD-10-CM | POA: Insufficient documentation

## 2018-12-23 DIAGNOSIS — I10 Essential (primary) hypertension: Secondary | ICD-10-CM | POA: Diagnosis not present

## 2018-12-23 LAB — CBC WITH DIFFERENTIAL/PLATELET
Abs Immature Granulocytes: 0.09 10*3/uL — ABNORMAL HIGH (ref 0.00–0.07)
Basophils Absolute: 0 10*3/uL (ref 0.0–0.1)
Basophils Relative: 0 %
EOS ABS: 0.1 10*3/uL (ref 0.0–0.5)
Eosinophils Relative: 1 %
HCT: 37.3 % — ABNORMAL LOW (ref 39.0–52.0)
Hemoglobin: 10.9 g/dL — ABNORMAL LOW (ref 13.0–17.0)
Immature Granulocytes: 1 %
LYMPHS ABS: 2.4 10*3/uL (ref 0.7–4.0)
Lymphocytes Relative: 22 %
MCH: 24.9 pg — ABNORMAL LOW (ref 26.0–34.0)
MCHC: 29.2 g/dL — ABNORMAL LOW (ref 30.0–36.0)
MCV: 85.4 fL (ref 80.0–100.0)
Monocytes Absolute: 0.9 10*3/uL (ref 0.1–1.0)
Monocytes Relative: 9 %
Neutro Abs: 7.1 10*3/uL (ref 1.7–7.7)
Neutrophils Relative %: 67 %
Platelets: 293 10*3/uL (ref 150–400)
RBC: 4.37 MIL/uL (ref 4.22–5.81)
RDW: 13.8 % (ref 11.5–15.5)
WBC: 10.7 10*3/uL — ABNORMAL HIGH (ref 4.0–10.5)
nRBC: 0 % (ref 0.0–0.2)

## 2018-12-23 LAB — COMPREHENSIVE METABOLIC PANEL
ALT: 12 U/L (ref 0–44)
AST: 12 U/L — ABNORMAL LOW (ref 15–41)
Albumin: 3.7 g/dL (ref 3.5–5.0)
Alkaline Phosphatase: 72 U/L (ref 38–126)
Anion gap: 11 (ref 5–15)
BUN: 10 mg/dL (ref 6–20)
CHLORIDE: 101 mmol/L (ref 98–111)
CO2: 26 mmol/L (ref 22–32)
Calcium: 8.7 mg/dL — ABNORMAL LOW (ref 8.9–10.3)
Creatinine, Ser: 0.87 mg/dL (ref 0.61–1.24)
GFR calc Af Amer: >60 mL/min (ref >60)
GFR calc non Af Amer: >60 mL/min (ref >60)
Glucose, Bld: 108 mg/dL — ABNORMAL HIGH (ref 70–99)
Potassium: 3.7 mmol/L (ref 3.5–5.1)
SODIUM: 138 mmol/L (ref 135–145)
Total Bilirubin: 0.4 mg/dL (ref 0.3–1.2)
Total Protein: 8.8 g/dL — ABNORMAL HIGH (ref 6.5–8.1)

## 2018-12-23 MED ORDER — TRAMADOL HCL 50 MG PO TABS: 50.0000 mg | ORAL_TABLET | Freq: Two times a day (BID) | ORAL | 0 refills | Status: AC | PRN

## 2018-12-23 MED ORDER — CLINDAMYCIN PHOSPHATE 600 MG/50ML IV SOLN
600.0000 mg | Freq: Once | INTRAVENOUS | Status: AC
  Administered 2018-12-23: 600 mg via INTRAVENOUS
  Filled 2018-12-23: qty 50

## 2018-12-23 MED ORDER — CLINDAMYCIN HCL 150 MG PO CAPS: 300.0000 mg | ORAL_CAPSULE | Freq: Four times a day (QID) | ORAL | 0 refills | Status: AC

## 2018-12-23 MED FILL — CLINDAMYCIN HCL 150 MG CAPS: 150 | 7 days supply | Qty: 56 | Fill #0

## 2018-12-23 MED FILL — traMADol HCL 50 MG TABS: 50 | 3 days supply | Qty: 6 | Fill #0

## 2018-12-23 NOTE — Discharge Instructions (Addendum)
You have been seen today for right knee pain. Please read and follow all provided instructions.   1. Medications: clindamycin (antibiotic), ultram for pain, usual home medications 2. Treatment: rest, drink plenty of fluids 3. Follow Up: Please follow up with your primary doctor in 2 days for discussion of your diagnoses and further evaluation after today's visit; if you do not have a primary care doctor use the resource guide provided to find one; Please return to the ER for any new or worsening symptoms. Please obtain all of your results from medical records or have your doctors office obtain the results - share them with your doctor - you should be seen at your doctors office. Call today to arrange your follow up.   Take medications as prescribed. Please review all of the medicines and only take them if you do not have an allergy to them. Return to the emergency room for worsening condition or new concerning symptoms. Follow up with your regular doctor. If you don't have a regular doctor use one of the numbers below to establish a primary care doctor.  Please be aware that if you are taking birth control pills, taking other prescriptions, ESPECIALLY ANTIBIOTICS may make the birth control ineffective - if this is the case, either do not engage in sexual activity or use alternative methods of birth control such as condoms until you have finished the medicine and your family doctor says it is OK to restart them. If you are on a blood thinner such as COUMADIN, be aware that any other medicine that you take may cause the coumadin to either work too much, or not enough - you should have your coumadin level rechecked in next 7 days if this is the case.  ?  It is also a possibility that you have an allergic reaction to any of the medicines that you have been prescribed - Everybody reacts differently to medications and while MOST people have no trouble with most medicines, you may have a reaction such as nausea,  vomiting, rash, swelling, shortness of breath. If this is the case, please stop taking the medicine immediately and contact your physician.  ?  You should return to the ER if you develop severe or worsening symptoms.   Emergency Department Resource Guide 1) Find a Doctor and Pay Out of Pocket Although you won't have to find out who is covered by your insurance plan, it is a good idea to ask around and get recommendations. You will then need to call the office and see if the doctor you have chosen will accept you as a new patient and what types of options they offer for patients who are self-pay. Some doctors offer discounts or will set up payment plans for their patients who do not have insurance, but you will need to ask so you aren't surprised when you get to your appointment.  2) Contact Your Local Health Department Not all health departments have doctors that can see patients for sick visits, but many do, so it is worth a call to see if yours does. If you don't know where your local health department is, you can check in your phone book. The CDC also has a tool to help you locate your state's health department, and many state websites also have listings of all of their local health departments.  3) Find a Walk-in Clinic If your illness is not likely to be very severe or complicated, you may want to try a walk in clinic.  These are popping up all over the country in pharmacies, drugstores, and shopping centers. They're usually staffed by nurse practitioners or physician assistants that have been trained to treat common illnesses and complaints. They're usually fairly quick and inexpensive. However, if you have serious medical issues or chronic medical problems, these are probably not your best option.  No Primary Care Doctor: Call Health Connect at  [phone] - they can help you locate a primary care doctor that  accepts your insurance, provides certain services, etc. Physician Referral [phone]  Emergency Department Resource Guide 1) Find a Doctor and Pay Out of Pocket Although you won't have to find out who is covered by your insurance plan, it is a good idea to ask around and get recommendations. You will then need to call the office and see if the doctor you have chosen will accept you as a new patient and what types of options they offer for patients who are self-pay. Some doctors offer discounts or will set up payment plans for their patients who do not have insurance, but you will need to ask so you aren't surprised when you get to your appointment.  2) Contact Your Local Health Department Not all health departments have doctors that can see patients for sick visits, but many do, so it is worth a call to see if yours does. If you don't know where your local health department is, you can check in your phone book. The CDC also has a tool to help you locate your state's health department, and many state websites also have listings of all of their local health departments.  3) Find a [facility] If your illness is not likely to be very severe or complicated, you may want to try a walk in clinic. These are popping up all over the country in pharmacies, drugstores, and shopping centers. They're usually staffed by nurse practitioners or physician assistants that have been trained to treat common illnesses and complaints. They're usually fairly quick and inexpensive. However, if you have serious medical issues or chronic medical problems, these are probably not your best option.  No Primary Care Doctor: Call Health Connect at  [phone] - they can help you locate a primary care doctor that  accepts your insurance, provides certain services, etc. Physician Referral Service- [phone]  Chronic Pain Problems: Organization         Address  Phone   Notes  [facility]  [phone] Patients need to be referred by their primary care doctor.    Medication Assistance: Organization         Address  Phone   Notes  [facility] Medication [facility] [address] [place], [place] [address] [phone] --Must be a resident of [facility] -- Must have NO insurance coverage whatsoever (no Medicaid/ Medicare, etc.) -- The pt. MUST have a primary care doctor that directs their care regularly and follows them in the community   [facility]  [phone]   [employer]  [phone]    Agencies that provide inexpensive medical care: Organization         Address  Phone   Notes  [place]  [phone]   [facility]    [phone]   [facility] [place], [place] [address] [phone]   [place] [address], [place] [phone]   [facility]    (  [phone]   [facility]    [phone]   [facility]  [address], [place] Phone:  [phone], Fax:  [phone] Hours of Operation:  9 am - 6 pm, M-F.  Also accepts Medicaid/Medicare and self-pay.  [facility] for [place] [address], [address], [place] Phone: [phone], Fax: [phone]. Hours of Operation:  8:30 am - 5:30 pm, M-F.  Also accepts Medicaid and self-pay.  [facility] [address], [place] Phone: [phone]   [place], [place], [place] [phone], Ext. [phone] Mondays & Thursdays: 7-9 AM.  First 15 patients are seen on a first come, first serve basis.    [place] Providers:  Organization         Address  Phone   Notes  [facility] [address], [place] [phone] Also accepts self-pay patients.  [facility] [address], [place]  [phone]   [place], [address], [place] [phone]   [facility] [address] [place] [phone]   Lucianne Lei [address], [place]   [phone] Only accepts [place] [facility] [place] patients after they have their name applied to their card.   Self-Pay (no insurance) in [facility]:  Organization         Address  Phone   Notes  Sickle Cell Patients, [facility] [place] [phone]   [facility] [place] [phone]   [facility] [place]  [place], [place], [place] [phone]   [facility]/Dr. Osei-Bonsu  [address], [place] or [address], [place] [phone] Phone number for both [place] and [place] locations is the same.  [facility] [address], [place] [phone]   [facility] [address] [place] or [address] [phone]   [facility] [address] [place] [phone], phone; [phone], fax Sees patients 1st and 3rd Saturday of every month.  Must not qualify for public or private insurance (i.e. Medicaid, Medicare, [place] Health Choice, Veterans' Benefits)  Household income should be no more than 200% of the poverty level The clinic cannot treat you if you are pregnant or think you are pregnant  Sexually transmitted diseases are not treated at the clinic.
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
## 2018-12-23 NOTE — ED Provider Notes (Signed)
  Face-to-face evaluation   History: He presents for evaluation of right knee pain with a draining wound.  This is a recurrent problem which occurs "every 3 months," according to the patient.  He is not actively seeing orthopedics.  Physical exam: Morbidly obese.  Right lower leg with chronic edema, is warm to touch, and has a superficial draining wound of the lateral aspect of the right knee.  This does not appear to be causing a deep tissue infection.   Medical screening examination/treatment/procedure(s) were conducted as a shared visit with non-physician practitioner(s) and myself.  I personally evaluated the patient during the encounter    Mancel Bale, MD 12/24/18 [phone]

## 2018-12-23 NOTE — ED Triage Notes (Signed)
Patient c/o right knee pain since yesterday. Patient denies any injuries. Pain is worse with weight bearing.

## 2019-01-16 MED FILL — BIKTARVY 50-200-25 MG TABS: 50-200-25 | 30 days supply | Qty: 30 | Fill #5

## 2019-01-31 ENCOUNTER — Encounter (HOSPITAL_BASED_OUTPATIENT_CLINIC_OR_DEPARTMENT_OTHER): Payer: Medicaid Other | Attending: Internal Medicine

## 2019-02-24 ENCOUNTER — Telehealth: Payer: Self-pay | Admitting: Pharmacy Technician

## 2019-02-24 NOTE — Telephone Encounter (Signed)
Patient called back and will pick up medication on Monday 02/27/2019.  [facility] is re-preparing the medicaiton to be ready for the patient.

## 2019-02-24 NOTE — Telephone Encounter (Signed)
[facility] Patient Advocate Encounter  Received notification from [facility] that patient did not pick up their medication from the pharmacy and has been returned to stock.  According to the computer, medication is now 9 days past due.  Left patient a voicemail on 03/06 @ 15:11 to see if we could get Biktarvy filled for him.   Beulah Gandy, CPhT Specialty Pharmacy Patient [facility] Phone: [phone] Fax: [phone] 02/24/2019 3:12 PM

## 2019-02-27 MED FILL — BIKTARVY 50-200-25 MG TABS: 50-200-25 | 30 days supply | Qty: 30 | Fill #6

## 2019-03-25 ENCOUNTER — Other Ambulatory Visit: Payer: Self-pay | Admitting: Infectious Diseases

## 2019-03-27 ENCOUNTER — Other Ambulatory Visit: Payer: Self-pay | Admitting: Pharmacist

## 2019-03-27 DIAGNOSIS — B20 Human immunodeficiency virus [HIV] disease: Secondary | ICD-10-CM

## 2019-03-27 MED ORDER — BICTEGRAVIR-EMTRICITAB-TENOFOV 50-200-25 MG PO TABS: 1.0000 | ORAL_TABLET | Freq: Every day | ORAL | 2 refills | Status: DC

## 2019-03-27 NOTE — Progress Notes (Signed)
Patient needs an appointment but will send in refills. Hopefully he can get in here soon.

## 2019-03-28 MED FILL — BIKTARVY 50-200-25 MG TABS: 50-200-25 | 30 days supply | Qty: 30 | Fill #0

## 2019-04-04 NOTE — Addendum Note (Signed)
Addended by: Robinette Haines on: 04/04/2019 10:57 AM   Modules accepted: Orders

## 2019-05-01 ENCOUNTER — Encounter (HOSPITAL_COMMUNITY): Payer: Medicaid Other

## 2019-05-01 ENCOUNTER — Other Ambulatory Visit: Payer: Self-pay

## 2019-05-01 ENCOUNTER — Inpatient Hospital Stay (HOSPITAL_COMMUNITY): Payer: Medicaid Other

## 2019-05-01 ENCOUNTER — Inpatient Hospital Stay (HOSPITAL_COMMUNITY)
Admission: EM | Admit: 2019-05-01 | Discharge: 2019-05-08 | DRG: 603 | Disposition: A | Discharge status: Home / Self Care | Payer: Medicaid Other | Attending: Family Medicine | Admitting: Family Medicine

## 2019-05-01 ENCOUNTER — Encounter (HOSPITAL_COMMUNITY): Payer: Self-pay | Admitting: Emergency Medicine

## 2019-05-01 DIAGNOSIS — Z86718 Personal history of other venous thrombosis and embolism: Secondary | ICD-10-CM

## 2019-05-01 DIAGNOSIS — I890 Lymphedema, not elsewhere classified: Secondary | ICD-10-CM | POA: Diagnosis present

## 2019-05-01 DIAGNOSIS — R739 Hyperglycemia, unspecified: Secondary | ICD-10-CM | POA: Diagnosis present

## 2019-05-01 DIAGNOSIS — Z95828 Presence of other vascular implants and grafts: Secondary | ICD-10-CM

## 2019-05-01 DIAGNOSIS — Z20828 Contact with and (suspected) exposure to other viral communicable diseases: Secondary | ICD-10-CM | POA: Diagnosis present

## 2019-05-01 DIAGNOSIS — E876 Hypokalemia: Secondary | ICD-10-CM | POA: Diagnosis present

## 2019-05-01 DIAGNOSIS — Z8674 Personal history of sudden cardiac arrest: Secondary | ICD-10-CM | POA: Diagnosis not present

## 2019-05-01 DIAGNOSIS — E66813 Obesity, class 3: Secondary | ICD-10-CM | POA: Diagnosis present

## 2019-05-01 DIAGNOSIS — L02416 Cutaneous abscess of left lower limb: Secondary | ICD-10-CM | POA: Diagnosis present

## 2019-05-01 DIAGNOSIS — Z885 Allergy status to narcotic agent status: Secondary | ICD-10-CM | POA: Diagnosis not present

## 2019-05-01 DIAGNOSIS — B20 Human immunodeficiency virus [HIV] disease: Secondary | ICD-10-CM | POA: Diagnosis present

## 2019-05-01 DIAGNOSIS — L03116 Cellulitis of left lower limb: Secondary | ICD-10-CM | POA: Diagnosis present

## 2019-05-01 DIAGNOSIS — R7303 Prediabetes: Secondary | ICD-10-CM | POA: Diagnosis present

## 2019-05-01 DIAGNOSIS — K219 Gastro-esophageal reflux disease without esophagitis: Secondary | ICD-10-CM | POA: Diagnosis present

## 2019-05-01 DIAGNOSIS — W228XXA Striking against or struck by other objects, initial encounter: Secondary | ICD-10-CM | POA: Diagnosis present

## 2019-05-01 DIAGNOSIS — E538 Deficiency of other specified B group vitamins: Secondary | ICD-10-CM

## 2019-05-01 DIAGNOSIS — L97329 Non-pressure chronic ulcer of left ankle with unspecified severity: Secondary | ICD-10-CM | POA: Diagnosis not present

## 2019-05-01 DIAGNOSIS — H9202 Otalgia, left ear: Secondary | ICD-10-CM | POA: Diagnosis present

## 2019-05-01 DIAGNOSIS — D649 Anemia, unspecified: Secondary | ICD-10-CM | POA: Diagnosis present

## 2019-05-01 DIAGNOSIS — L0390 Cellulitis, unspecified: Secondary | ICD-10-CM | POA: Diagnosis present

## 2019-05-01 DIAGNOSIS — R609 Edema, unspecified: Secondary | ICD-10-CM | POA: Diagnosis not present

## 2019-05-01 DIAGNOSIS — Z79899 Other long term (current) drug therapy: Secondary | ICD-10-CM

## 2019-05-01 DIAGNOSIS — Z91018 Allergy to other foods: Secondary | ICD-10-CM | POA: Diagnosis not present

## 2019-05-01 DIAGNOSIS — S81812A Laceration without foreign body, left lower leg, initial encounter: Secondary | ICD-10-CM | POA: Diagnosis present

## 2019-05-01 DIAGNOSIS — D638 Anemia in other chronic diseases classified elsewhere: Secondary | ICD-10-CM | POA: Diagnosis present

## 2019-05-01 DIAGNOSIS — F419 Anxiety disorder, unspecified: Secondary | ICD-10-CM | POA: Diagnosis not present

## 2019-05-01 DIAGNOSIS — R42 Dizziness and giddiness: Secondary | ICD-10-CM | POA: Diagnosis present

## 2019-05-01 DIAGNOSIS — D509 Iron deficiency anemia, unspecified: Secondary | ICD-10-CM

## 2019-05-01 DIAGNOSIS — Z86711 Personal history of pulmonary embolism: Secondary | ICD-10-CM | POA: Diagnosis present

## 2019-05-01 DIAGNOSIS — N289 Disorder of kidney and ureter, unspecified: Secondary | ICD-10-CM | POA: Diagnosis present

## 2019-05-01 DIAGNOSIS — R946 Abnormal results of thyroid function studies: Secondary | ICD-10-CM | POA: Diagnosis present

## 2019-05-01 DIAGNOSIS — Z6841 Body Mass Index (BMI) 40.0 and over, adult: Secondary | ICD-10-CM | POA: Diagnosis not present

## 2019-05-01 DIAGNOSIS — Z9119 Patient's noncompliance with other medical treatment and regimen: Secondary | ICD-10-CM | POA: Diagnosis not present

## 2019-05-01 DIAGNOSIS — Z87828 Personal history of other (healed) physical injury and trauma: Secondary | ICD-10-CM | POA: Diagnosis not present

## 2019-05-01 DIAGNOSIS — I10 Essential (primary) hypertension: Secondary | ICD-10-CM | POA: Diagnosis present

## 2019-05-01 DIAGNOSIS — I872 Venous insufficiency (chronic) (peripheral): Secondary | ICD-10-CM | POA: Diagnosis present

## 2019-05-01 DIAGNOSIS — Z91013 Allergy to seafood: Secondary | ICD-10-CM | POA: Diagnosis not present

## 2019-05-01 DIAGNOSIS — Z872 Personal history of diseases of the skin and subcutaneous tissue: Secondary | ICD-10-CM | POA: Diagnosis not present

## 2019-05-01 DIAGNOSIS — L03119 Cellulitis of unspecified part of limb: Secondary | ICD-10-CM | POA: Diagnosis present

## 2019-05-01 DIAGNOSIS — J329 Chronic sinusitis, unspecified: Secondary | ICD-10-CM | POA: Diagnosis present

## 2019-05-01 HISTORY — DX: Iron deficiency anemia, unspecified: D50.9

## 2019-05-01 HISTORY — DX: Deficiency of other specified B group vitamins: E53.8

## 2019-05-01 LAB — COMPREHENSIVE METABOLIC PANEL
ALT: 11 U/L (ref 0–44)
AST: 11 U/L — ABNORMAL LOW (ref 15–41)
Albumin: 3.1 g/dL — ABNORMAL LOW (ref 3.5–5.0)
Alkaline Phosphatase: 91 U/L (ref 38–126)
Anion gap: 10 (ref 5–15)
BUN: 10 mg/dL (ref 6–20)
CO2: 25 mmol/L (ref 22–32)
Calcium: 8.5 mg/dL — ABNORMAL LOW (ref 8.9–10.3)
Chloride: 100 mmol/L (ref 98–111)
Creatinine, Ser: 1.27 mg/dL — ABNORMAL HIGH (ref 0.61–1.24)
GFR calc Af Amer: >60 mL/min (ref >60)
GFR calc non Af Amer: >60 mL/min (ref >60)
Glucose, Bld: 128 mg/dL — ABNORMAL HIGH (ref 70–99)
Potassium: 3.5 mmol/L (ref 3.5–5.1)
Sodium: 135 mmol/L (ref 135–145)
Total Bilirubin: 0.9 mg/dL (ref 0.3–1.2)
Total Protein: 8.6 g/dL — ABNORMAL HIGH (ref 6.5–8.1)

## 2019-05-01 LAB — CBC WITH DIFFERENTIAL/PLATELET
Basophils Absolute: 0 10*3/uL (ref 0.0–0.1)
Basophils Relative: 0 %
Eosinophils Absolute: 0 10*3/uL (ref 0.0–0.5)
Eosinophils Relative: 0 %
HCT: 34.4 % — ABNORMAL LOW (ref 39.0–52.0)
Hemoglobin: 10.1 g/dL — ABNORMAL LOW (ref 13.0–17.0)
Lymphocytes Relative: 4 %
Lymphs Abs: 1 10*3/uL (ref 0.7–4.0)
MCH: 24.5 pg — ABNORMAL LOW (ref 26.0–34.0)
MCHC: 29.4 g/dL — ABNORMAL LOW (ref 30.0–36.0)
MCV: 83.5 fL (ref 80.0–100.0)
Monocytes Absolute: 1.6 10*3/uL — ABNORMAL HIGH (ref 0.1–1.0)
Monocytes Relative: 6 %
Neutro Abs: 22.1 10*3/uL — ABNORMAL HIGH (ref 1.7–7.7)
Neutrophils Relative %: 84 %
Platelets: 288 10*3/uL (ref 150–400)
RBC: 4.12 MIL/uL — ABNORMAL LOW (ref 4.22–5.81)
RDW: 14.5 % (ref 11.5–15.5)
WBC: 26.3 10*3/uL — ABNORMAL HIGH (ref 4.0–10.5)
nRBC: 0 % (ref 0.0–0.2)

## 2019-05-01 LAB — SARS CORONAVIRUS 2 BY RT PCR (HOSPITAL ORDER, PERFORMED IN ~~LOC~~ HOSPITAL LAB): SARS Coronavirus 2: NEGATIVE

## 2019-05-01 MED ORDER — ONDANSETRON HCL 4 MG/2ML IJ SOLN: 4.0000 mg | Freq: Four times a day (QID) | INTRAMUSCULAR | Status: DC | PRN

## 2019-05-01 MED ORDER — VANCOMYCIN HCL 10 G IV SOLR
2500.0000 mg | Freq: Once | INTRAVENOUS | Status: AC
  Administered 2019-05-01: 2500 mg via INTRAVENOUS
  Filled 2019-05-01: qty 2000

## 2019-05-01 MED ORDER — TRAMADOL HCL 50 MG PO TABS
100.0000 mg | ORAL_TABLET | Freq: Four times a day (QID) | ORAL | Status: DC | PRN
  Administered 2019-05-02 – 2019-05-08 (×8): 100 mg via ORAL
  Filled 2019-05-01 (×8): qty 2

## 2019-05-01 MED ORDER — SODIUM CHLORIDE 0.9 % IV SOLN
2.0000 g | Freq: Once | INTRAVENOUS | Status: AC
  Administered 2019-05-01: 2 g via INTRAVENOUS
  Filled 2019-05-01: qty 2

## 2019-05-01 MED ORDER — ENOXAPARIN SODIUM 80 MG/0.8ML ~~LOC~~ SOLN
80.0000 mg | Freq: Every day | SUBCUTANEOUS | Status: DC
  Administered 2019-05-02 – 2019-05-07 (×6): 80 mg via SUBCUTANEOUS
  Filled 2019-05-01 (×8): qty 0.8

## 2019-05-01 MED ORDER — ALBUTEROL SULFATE (2.5 MG/3ML) 0.083% IN NEBU: 3.0000 mL | INHALATION_SOLUTION | Freq: Four times a day (QID) | RESPIRATORY_TRACT | Status: DC | PRN

## 2019-05-01 MED ORDER — ONDANSETRON HCL 4 MG PO TABS: 4.0000 mg | ORAL_TABLET | Freq: Four times a day (QID) | ORAL | Status: DC | PRN

## 2019-05-01 MED ORDER — BICTEGRAVIR-EMTRICITAB-TENOFOV 50-200-25 MG PO TABS
1.0000 | ORAL_TABLET | Freq: Every day | ORAL | Status: DC
  Administered 2019-05-02 – 2019-05-08 (×7): 1 via ORAL
  Filled 2019-05-01 (×7): qty 1

## 2019-05-01 MED ORDER — ACETAMINOPHEN 325 MG PO TABS
650.0000 mg | ORAL_TABLET | Freq: Four times a day (QID) | ORAL | Status: DC | PRN
  Filled 2019-05-01: qty 2

## 2019-05-01 MED ORDER — ACETAMINOPHEN 650 MG RE SUPP: 650.0000 mg | Freq: Four times a day (QID) | RECTAL | Status: DC | PRN

## 2019-05-01 MED ORDER — VANCOMYCIN HCL 10 G IV SOLR
1250.0000 mg | Freq: Two times a day (BID) | INTRAVENOUS | Status: DC
  Administered 2019-05-02 – 2019-05-04 (×5): 1250 mg via INTRAVENOUS
  Filled 2019-05-01 (×6): qty 1250

## 2019-05-01 MED ORDER — SENNA 8.6 MG PO TABS
1.0000 | ORAL_TABLET | Freq: Two times a day (BID) | ORAL | Status: DC
  Administered 2019-05-01 – 2019-05-07 (×3): 8.6 mg via ORAL
  Filled 2019-05-01 (×9): qty 1

## 2019-05-01 MED ORDER — SODIUM CHLORIDE 0.9 % IV SOLN
INTRAVENOUS | Status: AC
  Administered 2019-05-01: via INTRAVENOUS

## 2019-05-01 MED ORDER — HYDROCODONE-ACETAMINOPHEN 5-325 MG PO TABS
1.0000 | ORAL_TABLET | ORAL | Status: DC | PRN
  Administered 2019-05-01 – 2019-05-08 (×13): 2 via ORAL
  Filled 2019-05-01 (×13): qty 2

## 2019-05-01 MED ORDER — TETANUS-DIPHTH-ACELL PERTUSSIS 5-2.5-18.5 LF-MCG/0.5 IM SUSP
0.5000 mL | Freq: Once | INTRAMUSCULAR | Status: DC
  Filled 2019-05-01: qty 0.5

## 2019-05-01 MED ORDER — POTASSIUM CHLORIDE CRYS ER 20 MEQ PO TBCR
20.0000 meq | EXTENDED_RELEASE_TABLET | Freq: Once | ORAL | Status: AC
  Administered 2019-05-01: 20 meq via ORAL
  Filled 2019-05-01: qty 1

## 2019-05-01 MED ORDER — SODIUM CHLORIDE 0.9 % IV SOLN
2.0000 g | Freq: Three times a day (TID) | INTRAVENOUS | Status: DC
  Administered 2019-05-02 – 2019-05-04 (×7): 2 g via INTRAVENOUS
  Filled 2019-05-01 (×8): qty 2

## 2019-05-01 NOTE — ED Notes (Signed)
Left AC PIV placement via ultrasound by Logan,RN. 2nd PIV placement attempt via ultrasound was unsuccessful. Labs drawn. Cultures sent. Patient updated on POC- awaiting ultrasound.
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
## 2019-05-01 NOTE — Progress Notes (Signed)
Pharmacy Antibiotic Note  Todd Parrish is a 38 y.o. male admitted on 05/01/2019 with cellulitis.  Pharmacy has been consulted for cefepime and vancomycin dosing.  Plan: Cefepime 2 Gm IV q8h Vancomycin 2500 mg x1 then 1250 mg IV q12h for est AUC = 471 Goal AUC = 400-550 F/u scr/cultures/levels Adjusted Lovenox to 80 mg daily (~0.5 mg/kg) in pt with BMI=56     Temp (24hrs), Avg:98.4 F (36.9 C), Min:98.4 F (36.9 C), Max:98.4 F (36.9 C)  Recent Labs  Lab 05/01/19 1714  WBC 26.3*  CREATININE 1.27*    CrCl cannot be calculated (Unknown ideal weight.).    Allergies  Allergen Reactions  . Morphine And Related Nausea And Vomiting and Other (See Comments)    Elevated blood pressure  . Pork-Derived Products Other (See Comments)    - doesn't eat pork  . Shrimp [Shellfish Allergy] Other (See Comments)    -doesn't eat shrimp religious puroses    Antimicrobials this admission: 5/11 cefepime >>  5/11 vancomycin >>   Dose adjustments this admission:   Microbiology results:  BCx:   UCx:    Sputum:    MRSA PCR:  Thank you for allowing pharmacy to be a part of this patient's care.  Lorenza Evangelist 05/01/2019 10:02 PM
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
## 2019-05-01 NOTE — ED Notes (Signed)
Attempted to call report, Richelle RN not available to receive report at this time.  Told to call back in 10 minutes.

## 2019-05-01 NOTE — Progress Notes (Addendum)
A consult was received from an ED physician for vanc/cefepime per pharmacy dosing.  The patient's profile has been reviewed for ht/wt/allergies/indication/available labs.   A one time order has been placed for vanc 2500mg  and cefepime 2g.  Further antibiotics/pharmacy consults should be ordered by admitting physician if indicated.                       Thank you, Berkley Harvey 05/01/2019  4:21 PM

## 2019-05-01 NOTE — ED Triage Notes (Signed)
Per EMS pt cut left leg on bed post a week ago and verbalizes "it's infected." EMS denies obvious drainage, redness, or swelling.

## 2019-05-02 ENCOUNTER — Inpatient Hospital Stay: Payer: Self-pay

## 2019-05-02 ENCOUNTER — Inpatient Hospital Stay (HOSPITAL_COMMUNITY): Payer: Medicaid Other

## 2019-05-02 DIAGNOSIS — R609 Edema, unspecified: Secondary | ICD-10-CM

## 2019-05-02 LAB — COMPREHENSIVE METABOLIC PANEL
ALT: 12 U/L (ref 0–44)
AST: 12 U/L — ABNORMAL LOW (ref 15–41)
Albumin: 2.8 g/dL — ABNORMAL LOW (ref 3.5–5.0)
Alkaline Phosphatase: 79 U/L (ref 38–126)
Anion gap: 9 (ref 5–15)
BUN: 12 mg/dL (ref 6–20)
CO2: 22 mmol/L (ref 22–32)
Calcium: 8.2 mg/dL — ABNORMAL LOW (ref 8.9–10.3)
Chloride: 104 mmol/L (ref 98–111)
Creatinine, Ser: 1.18 mg/dL (ref 0.61–1.24)
GFR calc Af Amer: >60 mL/min (ref >60)
GFR calc non Af Amer: >60 mL/min (ref >60)
Glucose, Bld: 176 mg/dL — ABNORMAL HIGH (ref 70–99)
Potassium: 3.4 mmol/L — ABNORMAL LOW (ref 3.5–5.1)
Sodium: 135 mmol/L (ref 135–145)
Total Bilirubin: 0.9 mg/dL (ref 0.3–1.2)
Total Protein: 7.6 g/dL (ref 6.5–8.1)

## 2019-05-02 LAB — CBC
HCT: 32.8 % — ABNORMAL LOW (ref 39.0–52.0)
Hemoglobin: 9.7 g/dL — ABNORMAL LOW (ref 13.0–17.0)
MCH: 24.6 pg — ABNORMAL LOW (ref 26.0–34.0)
MCHC: 29.6 g/dL — ABNORMAL LOW (ref 30.0–36.0)
MCV: 83 fL (ref 80.0–100.0)
Platelets: 298 10*3/uL (ref 150–400)
RBC: 3.95 MIL/uL — ABNORMAL LOW (ref 4.22–5.81)
RDW: 14.6 % (ref 11.5–15.5)
WBC: 23.7 10*3/uL — ABNORMAL HIGH (ref 4.0–10.5)
nRBC: 0 % (ref 0.0–0.2)

## 2019-05-02 LAB — RETICULOCYTES
Immature Retic Fract: 14.1 % (ref 2.3–15.9)
RBC.: 3.82 MIL/uL — ABNORMAL LOW (ref 4.22–5.81)
Retic Count, Absolute: 55.4 10*3/uL (ref 19.0–186.0)
Retic Ct Pct: 1.5 % (ref 0.4–3.1)

## 2019-05-02 LAB — TSH: TSH: 5.566 u[IU]/mL — ABNORMAL HIGH (ref 0.350–4.500)

## 2019-05-02 LAB — FERRITIN: Ferritin: 311 ng/mL (ref 24–336)

## 2019-05-02 LAB — HEMOGLOBIN A1C
Hgb A1c MFr Bld: 4.9 % (ref 4.8–5.6)
Mean Plasma Glucose: 93.93 mg/dL

## 2019-05-02 LAB — VITAMIN B12: Vitamin B-12: 340 pg/mL (ref 180–914)

## 2019-05-02 LAB — IRON AND TIBC
Iron: 10 ug/dL — ABNORMAL LOW (ref 45–182)
Saturation Ratios: 4 % — ABNORMAL LOW (ref 17.9–39.5)
TIBC: 258 ug/dL (ref 250–450)
UIBC: 248 ug/dL

## 2019-05-02 LAB — T-HELPER CELLS (CD4) COUNT (NOT AT ARMC)
CD4 % Helper T Cell: 25 % — ABNORMAL LOW (ref 33–65)
CD4 T Cell Abs: 215 /uL — ABNORMAL LOW (ref 400–1790)

## 2019-05-02 LAB — MAGNESIUM: Magnesium: 2 mg/dL (ref 1.7–2.4)

## 2019-05-02 LAB — PREALBUMIN: Prealbumin: 6 mg/dL — ABNORMAL LOW (ref 18–38)

## 2019-05-02 LAB — C-REACTIVE PROTEIN: CRP: 29.4 mg/dL — ABNORMAL HIGH (ref <1.0)

## 2019-05-02 LAB — PHOSPHORUS: Phosphorus: 3.1 mg/dL (ref 2.5–4.6)

## 2019-05-02 LAB — SEDIMENTATION RATE: Sed Rate: 120 mm/hr — ABNORMAL HIGH (ref 0–16)

## 2019-05-02 LAB — MRSA PCR SCREENING: MRSA by PCR: NEGATIVE

## 2019-05-02 LAB — FOLATE: Folate: 2.6 ng/mL — ABNORMAL LOW (ref >5.9)

## 2019-05-02 MED ORDER — JUVEN PO PACK
1.0000 | PACK | Freq: Two times a day (BID) | ORAL | Status: DC
  Administered 2019-05-02 – 2019-05-07 (×6): 1 via ORAL
  Filled 2019-05-02 (×14): qty 1

## 2019-05-02 MED ORDER — ENSURE MAX PROTEIN PO LIQD
11.0000 [oz_av] | Freq: Every day | ORAL | Status: DC
  Administered 2019-05-06 – 2019-05-07 (×2): 11 [oz_av] via ORAL

## 2019-05-02 MED ORDER — POTASSIUM CHLORIDE CRYS ER 20 MEQ PO TBCR
40.0000 meq | EXTENDED_RELEASE_TABLET | Freq: Two times a day (BID) | ORAL | Status: AC
  Administered 2019-05-03: 40 meq via ORAL
  Filled 2019-05-02: qty 2

## 2019-05-02 NOTE — Consult Note (Deleted)
WOC Nurse wound consult note Reason for Consult: Bilateral LEs, right will cellulitis, chronic venous insufficiency Wound type:Venous insufficiency Pressure Injury POA N/A Measurement:N/A Wound bed:N/A Drainage (amount, consistency, odor) Serous weeping resolved overnight with elevation Periwound: surface lesions on anterior LEs  Dressing procedure/placement/frequency: Ortho Tech to initiate Unna's Boots twice weekly and maintain while in house. POC discussed with Dr. Marion Downer.  If you agree, please order [facility] to continue therapy, patient to follow up with her PCP or with the outpatient Wound Care Center of her choosing.   WOC nursing team will not follow, but will remain available to this patient, the nursing and medical teams.  Please re-consult if needed. Thanks, Ladona Mow, MSN, RN, GNP, [place]  Pager# [phone]
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
## 2019-05-02 NOTE — Progress Notes (Signed)
Left lower leg wound measure 2.8x1.7x0.5, reported to [employer]

## 2019-05-02 NOTE — Progress Notes (Signed)
Bilateral lower extremity venous duplex has been completed. Preliminary results can be found in CV Proc through chart review.  Results were given to the patient's nurse, Leann.   05/02/19 8:50 AM Olen Cordial RVT

## 2019-05-02 NOTE — Consult Note (Signed)
WOC Nurse wound consult note Reason for Consult: Left lateral LE chronic full thickness wound Wound type:Chronic venous insufficiency Pressure Injury POA: N/A Measurement:2.8cm x 1.8cm x 0.4cm Wound bed: 85% yellow slough, 15% red tissue Drainage (amount, consistency, odor) small to moderate serous to light yellow exudate Periwound: Intact with hemosiderin staining Dressing procedure/placement/frequency: I will recommend once daily and PRN drainage strike through exterior dressing with an alginate dressing.  This will allow for absorption, autolytic debridement of the yellow slough in the wound bed and perhaps most importantly, atraumatic dressing changes. Bedside RN Yehuda Mao assisted me with the wound assessment today and I appreciate her assistance.  WOC nursing team will not follow, but will remain available to this patient, the nursing and medical teams.  Please re-consult if needed. Thanks, Ladona Mow, MSN, RN, GNP, [facility]  Pager# [phone]
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
## 2019-05-02 NOTE — Progress Notes (Signed)
Assessed patient for a PIV with ultrasound as well as Midline and the patient has small veins that are not appropriate for a PIV and the upper extremities have deep veins that are not appropriate for a midline. The patient needs a PICC line or a Central line. The patient currently has a PIV in the Left [facility] that is leaking and painful from vancomycin. The medications have been stopped at this time. RN Geoffery Spruce made aware and MD Marland Mcalpine was sent a message as well.

## 2019-05-02 NOTE — Progress Notes (Signed)
Patient has low grade temp and blood culture pending. Communicated with Merlene Laughter MD re: PICC order. OK to start PIV and reenter PICC order when needed. Will follow up.

## 2019-05-03 ENCOUNTER — Encounter (HOSPITAL_COMMUNITY): Payer: Self-pay

## 2019-05-03 LAB — COMPREHENSIVE METABOLIC PANEL
ALT: 14 U/L (ref 0–44)
AST: 14 U/L — ABNORMAL LOW (ref 15–41)
Albumin: 2.4 g/dL — ABNORMAL LOW (ref 3.5–5.0)
Alkaline Phosphatase: 84 U/L (ref 38–126)
Anion gap: 10 (ref 5–15)
BUN: 13 mg/dL (ref 6–20)
CO2: 22 mmol/L (ref 22–32)
Calcium: 8 mg/dL — ABNORMAL LOW (ref 8.9–10.3)
Chloride: 103 mmol/L (ref 98–111)
Creatinine, Ser: 1.09 mg/dL (ref 0.61–1.24)
GFR calc Af Amer: >60 mL/min (ref >60)
GFR calc non Af Amer: >60 mL/min (ref >60)
Glucose, Bld: 127 mg/dL — ABNORMAL HIGH (ref 70–99)
Potassium: 3.5 mmol/L (ref 3.5–5.1)
Sodium: 135 mmol/L (ref 135–145)
Total Bilirubin: 1 mg/dL (ref 0.3–1.2)
Total Protein: 7.7 g/dL (ref 6.5–8.1)

## 2019-05-03 LAB — CBC WITH DIFFERENTIAL/PLATELET
Abs Immature Granulocytes: 0.31 10*3/uL — ABNORMAL HIGH (ref 0.00–0.07)
Basophils Absolute: 0 10*3/uL (ref 0.0–0.1)
Basophils Relative: 0 %
Eosinophils Absolute: 0.2 10*3/uL (ref 0.0–0.5)
Eosinophils Relative: 1 %
HCT: 31.4 % — ABNORMAL LOW (ref 39.0–52.0)
Hemoglobin: 9.2 g/dL — ABNORMAL LOW (ref 13.0–17.0)
Immature Granulocytes: 2 %
Lymphocytes Relative: 12 %
Lymphs Abs: 2.3 10*3/uL (ref 0.7–4.0)
MCH: 24 pg — ABNORMAL LOW (ref 26.0–34.0)
MCHC: 29.3 g/dL — ABNORMAL LOW (ref 30.0–36.0)
MCV: 82 fL (ref 80.0–100.0)
Monocytes Absolute: 1.9 10*3/uL — ABNORMAL HIGH (ref 0.1–1.0)
Monocytes Relative: 9 %
Neutro Abs: 15.3 10*3/uL — ABNORMAL HIGH (ref 1.7–7.7)
Neutrophils Relative %: 76 %
Platelets: 308 10*3/uL (ref 150–400)
RBC: 3.83 MIL/uL — ABNORMAL LOW (ref 4.22–5.81)
RDW: 14.7 % (ref 11.5–15.5)
WBC: 20.1 10*3/uL — ABNORMAL HIGH (ref 4.0–10.5)
nRBC: 0 % (ref 0.0–0.2)

## 2019-05-03 LAB — MAGNESIUM: Magnesium: 2.1 mg/dL (ref 1.7–2.4)

## 2019-05-03 LAB — PHOSPHORUS: Phosphorus: 3.3 mg/dL (ref 2.5–4.6)

## 2019-05-03 LAB — T4, FREE: Free T4: 0.93 ng/dL (ref 0.82–1.77)

## 2019-05-03 MED ORDER — SALINE SPRAY 0.65 % NA SOLN
2.0000 | Freq: Three times a day (TID) | NASAL | Status: DC
  Administered 2019-05-03 – 2019-05-07 (×6): 2 via NASAL
  Filled 2019-05-03: qty 44
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
## 2019-05-03 NOTE — Progress Notes (Signed)
Patient complains throbbing pain on his left ear.He reports its started after they swab him for Covid 19 test down in the ED.

## 2019-05-04 ENCOUNTER — Ambulatory Visit (HOSPITAL_COMMUNITY)
Admission: EM | Admit: 2019-05-04 | Discharge: 2019-05-04 | Disposition: A | Discharge status: Home / Self Care | Payer: Medicaid Other | Source: Home / Self Care | Attending: Family Medicine | Admitting: Family Medicine

## 2019-05-04 DIAGNOSIS — F419 Anxiety disorder, unspecified: Secondary | ICD-10-CM

## 2019-05-04 DIAGNOSIS — L03116 Cellulitis of left lower limb: Principal | ICD-10-CM

## 2019-05-04 DIAGNOSIS — Z91013 Allergy to seafood: Secondary | ICD-10-CM

## 2019-05-04 DIAGNOSIS — Z872 Personal history of diseases of the skin and subcutaneous tissue: Secondary | ICD-10-CM

## 2019-05-04 DIAGNOSIS — I890 Lymphedema, not elsewhere classified: Secondary | ICD-10-CM

## 2019-05-04 DIAGNOSIS — Z885 Allergy status to narcotic agent status: Secondary | ICD-10-CM

## 2019-05-04 DIAGNOSIS — B20 Human immunodeficiency virus [HIV] disease: Secondary | ICD-10-CM

## 2019-05-04 DIAGNOSIS — L97329 Non-pressure chronic ulcer of left ankle with unspecified severity: Secondary | ICD-10-CM

## 2019-05-04 DIAGNOSIS — Z91018 Allergy to other foods: Secondary | ICD-10-CM

## 2019-05-04 LAB — CBC
HCT: 30.2 % — ABNORMAL LOW (ref 39.0–52.0)
Hemoglobin: 9 g/dL — ABNORMAL LOW (ref 13.0–17.0)
MCH: 24.4 pg — ABNORMAL LOW (ref 26.0–34.0)
MCHC: 29.8 g/dL — ABNORMAL LOW (ref 30.0–36.0)
MCV: 81.8 fL (ref 80.0–100.0)
Platelets: 212 10*3/uL (ref 150–400)
RBC: 3.69 MIL/uL — ABNORMAL LOW (ref 4.22–5.81)
RDW: 15 % (ref 11.5–15.5)
WBC: 13.2 10*3/uL — ABNORMAL HIGH (ref 4.0–10.5)
nRBC: 0 % (ref 0.0–0.2)

## 2019-05-04 LAB — BASIC METABOLIC PANEL
Anion gap: 10 (ref 5–15)
BUN: 13 mg/dL (ref 6–20)
CO2: 22 mmol/L (ref 22–32)
Calcium: 8 mg/dL — ABNORMAL LOW (ref 8.9–10.3)
Chloride: 101 mmol/L (ref 98–111)
Creatinine, Ser: 0.97 mg/dL (ref 0.61–1.24)
GFR calc Af Amer: >60 mL/min (ref >60)
GFR calc non Af Amer: >60 mL/min (ref >60)
Glucose, Bld: 138 mg/dL — ABNORMAL HIGH (ref 70–99)
Potassium: 3.5 mmol/L (ref 3.5–5.1)
Sodium: 133 mmol/L — ABNORMAL LOW (ref 135–145)

## 2019-05-04 LAB — T3: T3, Total: 68 ng/dL — ABNORMAL LOW (ref 71–180)

## 2019-05-04 MED ORDER — VANCOMYCIN HCL 10 G IV SOLR
1500.0000 mg | Freq: Two times a day (BID) | INTRAVENOUS | Status: DC
  Filled 2019-05-04: qty 1500

## 2019-05-04 MED ORDER — IBUPROFEN 200 MG PO TABS
600.0000 mg | ORAL_TABLET | Freq: Three times a day (TID) | ORAL | Status: AC
  Administered 2019-05-04 – 2019-05-05 (×3): 600 mg via ORAL
  Filled 2019-05-04 (×3): qty 3

## 2019-05-04 MED ORDER — CEFAZOLIN SODIUM-DEXTROSE 2-4 GM/100ML-% IV SOLN
2.0000 g | Freq: Three times a day (TID) | INTRAVENOUS | Status: DC
  Administered 2019-05-04 – 2019-05-05 (×4): 2 g via INTRAVENOUS
  Filled 2019-05-04 (×9): qty 100
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
## 2019-05-04 NOTE — Progress Notes (Signed)
Pharmacy Antibiotic Note  Todd Parrish is a 38 y.o. male admitted on 05/01/2019 with cellulitis.  Pharmacy has been consulted for cefepime and vancomycin dosing.  Today, 05/04/2019:  D4 abx  Last fever 5/12  WBC, SCr improved  Grp A Strep growing on superficial wound Cx  MRI pending  Plan:  Continue cefepime 2g IV q8 hr  Will adjust vancomycin to 1500 mg IV q12 hr with improvement in renal function (AUC 441 with SCr 0.97, Vd 0.5) - may be able to narrow abx once MRI resulted  SCr q48 while on vanc   Height: 5\' 7"  (170.2 cm) Weight: (!) 360 lb (163.3 kg) IBW/kg (Calculated) : 66.1  Temp (24hrs), Avg:98.7 F (37.1 C), Min:98.2 F (36.8 C), Max:99 F (37.2 C)  Recent Labs  Lab 05/01/19 1714 05/02/19 0953 05/03/19 0521 05/04/19 0408  WBC 26.3* 23.7* 20.1* 13.2*  CREATININE 1.27* 1.18 1.09 0.97    Estimated Creatinine Clearance: 151.8 mL/min (by C-G formula based on SCr of 0.97 mg/dL).    Allergies  Allergen Reactions  . Morphine And Related Nausea And Vomiting and Other (See Comments)    Elevated blood pressure  . Pork-Derived Products Other (See Comments)    - doesn't eat pork  . Shrimp [Shellfish Allergy] Other (See Comments)    -doesn't eat shrimp religious puroses    AAntimicrobials this admission: 5/11 cefepime >>  5/11 vancomycin >>   Dose adjustments this admission: 5/14: incr vanc to 1500 q12 hr with improved SCr  Microbiology results: 5/11 BCx: ngtd 5/11 Wound Cx (superficial): few GPC >> mod GAS, final result pending 5/11 MRSA PCR: neg    Thank you for allowing pharmacy to be a part of this patient's care.  Bernadene Person, PharmD, BCPS [phone] 05/04/2019, 1:16 PM

## 2019-05-04 NOTE — Progress Notes (Signed)
[facility] for [facility] at 330pm [facility]. Pt is scheduled for MRI at 4pm.

## 2019-05-05 DIAGNOSIS — Z86718 Personal history of other venous thrombosis and embolism: Secondary | ICD-10-CM

## 2019-05-05 DIAGNOSIS — Z79899 Other long term (current) drug therapy: Secondary | ICD-10-CM

## 2019-05-05 DIAGNOSIS — Z87828 Personal history of other (healed) physical injury and trauma: Secondary | ICD-10-CM

## 2019-05-05 LAB — CBC
HCT: 35.5 % — ABNORMAL LOW (ref 39.0–52.0)
Hemoglobin: 10.3 g/dL — ABNORMAL LOW (ref 13.0–17.0)
MCH: 23.8 pg — ABNORMAL LOW (ref 26.0–34.0)
MCHC: 29 g/dL — ABNORMAL LOW (ref 30.0–36.0)
MCV: 82 fL (ref 80.0–100.0)
Platelets: 323 10*3/uL (ref 150–400)
RBC: 4.33 MIL/uL (ref 4.22–5.81)
RDW: 14.9 % (ref 11.5–15.5)
WBC: 11 10*3/uL — ABNORMAL HIGH (ref 4.0–10.5)
nRBC: 0 % (ref 0.0–0.2)

## 2019-05-05 LAB — COMPREHENSIVE METABOLIC PANEL
ALT: 23 U/L (ref 0–44)
AST: 24 U/L (ref 15–41)
Albumin: 2.4 g/dL — ABNORMAL LOW (ref 3.5–5.0)
Alkaline Phosphatase: 92 U/L (ref 38–126)
Anion gap: 11 (ref 5–15)
BUN: 14 mg/dL (ref 6–20)
CO2: 25 mmol/L (ref 22–32)
Calcium: 8.3 mg/dL — ABNORMAL LOW (ref 8.9–10.3)
Chloride: 100 mmol/L (ref 98–111)
Creatinine, Ser: 0.96 mg/dL (ref 0.61–1.24)
GFR calc Af Amer: >60 mL/min (ref >60)
GFR calc non Af Amer: >60 mL/min (ref >60)
Glucose, Bld: 135 mg/dL — ABNORMAL HIGH (ref 70–99)
Potassium: 3.5 mmol/L (ref 3.5–5.1)
Sodium: 136 mmol/L (ref 135–145)
Total Bilirubin: 0.5 mg/dL (ref 0.3–1.2)
Total Protein: 8 g/dL (ref 6.5–8.1)

## 2019-05-05 LAB — AEROBIC CULTURE? (SUPERFICIAL SPECIMEN)

## 2019-05-05 LAB — AEROBIC CULTURE W GRAM STAIN (SUPERFICIAL SPECIMEN)
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
## 2019-05-06 ENCOUNTER — Encounter (HOSPITAL_COMMUNITY): Payer: Self-pay | Admitting: *Deleted

## 2019-05-06 LAB — CULTURE, BLOOD (ROUTINE X 2)
Culture: NO GROWTH
Culture: NO GROWTH
Special Requests: ADEQUATE
Special Requests: ADEQUATE

## 2019-05-06 MED ORDER — CEPHALEXIN 500 MG PO CAPS
500.0000 mg | ORAL_CAPSULE | Freq: Two times a day (BID) | ORAL | Status: DC
  Administered 2019-05-06 – 2019-05-08 (×4): 500 mg via ORAL
  Filled 2019-05-06 (×4): qty 1

## 2019-05-06 NOTE — Progress Notes (Signed)
Pt's iv  infiltrated . Pt scheduled for IV ANCEF . Unable to complete Ancef. IV team nurse attempted to get iv but unsuccessful. Notified to oncall NP. NP is OK to hold 2am dose of Ancef.  NP said he is going to order for picc line placement. Will continue to monitor.
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
## 2019-05-07 NOTE — Progress Notes (Signed)
Patient ID: Todd Parrish, male   DOB: July 05, 1981, 38 y.o.   MRN: [id number]          [facility]    Date of Admission:  05/01/2019   Total days of antibiotics 7 (effective therapy)        Day 1 cephalexin         Continues to improve on therapy for recurrent lower extremity cellulitis.  I agree with IV to oral therapy with cephalexin.  I will treat him for 3 more days.  He will continue Biktarvy and follow-up with Dr. Ninetta Lights in our clinic on 05/16/2019.  I will sign off now.  Cliffton Asters, MD [facility] for Infectious Disease [facility] [phone] pager   [phone] cell 05/07/2019, 1:05 PM

## 2019-05-08 MED ORDER — CEPHALEXIN 500 MG PO CAPS: 500.0000 mg | ORAL_CAPSULE | Freq: Two times a day (BID) | ORAL | 0 refills | Status: AC

## 2019-05-08 MED ORDER — OXYCODONE-ACETAMINOPHEN 5-325 MG PO TABS: 1.0000 | ORAL_TABLET | ORAL | 0 refills | Status: AC | PRN

## 2019-05-08 MED ORDER — SENNA 8.6 MG PO TABS: 1.0000 | ORAL_TABLET | Freq: Every day | ORAL | 0 refills | Status: AC | PRN

## 2019-05-08 MED FILL — OXYCODONE-ACETAMINOPHEN 5-3: 5-325 | 4 days supply | Qty: 20 | Fill #0

## 2019-05-08 MED FILL — CEPHALEXIN 500 MG CAPSULE: 500 | 3 days supply | Qty: 6 | Fill #0

## 2019-05-08 MED FILL — BIKTARVY 50-200-25 MG TABS: 50-200-25 | 30 days supply | Qty: 30 | Fill #1
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
## 2019-05-08 NOTE — Progress Notes (Signed)
Discharge instructions given to patient. Patient had no questions. NT will wheel patient to the front once family comes to pick up patient.

## 2019-05-10 LAB — HIV-1 RNA QUANT-NO REFLEX-BLD
HIV 1 RNA Quant: <20 copies/mL
LOG10 HIV-1 RNA: UNDETERMINED log10copy/mL

## 2019-05-12 LAB — HIV-1 RNA QUANT-NO REFLEX-BLD
HIV 1 RNA Quant: <20 copies/mL
LOG10 HIV-1 RNA: UNDETERMINED log10copy/mL

## 2019-05-16 ENCOUNTER — Ambulatory Visit: Payer: Medicaid Other | Admitting: Infectious Diseases

## 2019-06-28 MED FILL — BIKTARVY 50-200-25 MG TABS: 50-200-25 | 30 days supply | Qty: 30 | Fill #2

## 2019-07-04 ENCOUNTER — Encounter (HOSPITAL_COMMUNITY): Payer: Self-pay | Admitting: Family Medicine

## 2019-07-04 ENCOUNTER — Inpatient Hospital Stay (HOSPITAL_COMMUNITY)
Admission: EM | Admit: 2019-07-04 | Discharge: 2019-07-07 | DRG: 871 | Disposition: A | Discharge status: Home / Self Care | Payer: Medicaid Other | Attending: Internal Medicine | Admitting: Internal Medicine

## 2019-07-04 DIAGNOSIS — G629 Polyneuropathy, unspecified: Secondary | ICD-10-CM | POA: Diagnosis present

## 2019-07-04 DIAGNOSIS — B20 Human immunodeficiency virus [HIV] disease: Secondary | ICD-10-CM | POA: Diagnosis present

## 2019-07-04 DIAGNOSIS — E669 Obesity, unspecified: Secondary | ICD-10-CM | POA: Diagnosis present

## 2019-07-04 DIAGNOSIS — Z86711 Personal history of pulmonary embolism: Secondary | ICD-10-CM

## 2019-07-04 DIAGNOSIS — Z86718 Personal history of other venous thrombosis and embolism: Secondary | ICD-10-CM

## 2019-07-04 DIAGNOSIS — A419 Sepsis, unspecified organism: Principal | ICD-10-CM | POA: Diagnosis present

## 2019-07-04 DIAGNOSIS — I10 Essential (primary) hypertension: Secondary | ICD-10-CM | POA: Diagnosis present

## 2019-07-04 DIAGNOSIS — Z91018 Allergy to other foods: Secondary | ICD-10-CM

## 2019-07-04 DIAGNOSIS — R7303 Prediabetes: Secondary | ICD-10-CM | POA: Diagnosis present

## 2019-07-04 DIAGNOSIS — Z91013 Allergy to seafood: Secondary | ICD-10-CM

## 2019-07-04 DIAGNOSIS — K219 Gastro-esophageal reflux disease without esophagitis: Secondary | ICD-10-CM | POA: Diagnosis present

## 2019-07-04 DIAGNOSIS — Z6841 Body Mass Index (BMI) 40.0 and over, adult: Secondary | ICD-10-CM

## 2019-07-04 DIAGNOSIS — L0390 Cellulitis, unspecified: Secondary | ICD-10-CM | POA: Diagnosis present

## 2019-07-04 DIAGNOSIS — Z885 Allergy status to narcotic agent status: Secondary | ICD-10-CM

## 2019-07-04 DIAGNOSIS — Z79899 Other long term (current) drug therapy: Secondary | ICD-10-CM

## 2019-07-04 DIAGNOSIS — Z1159 Encounter for screening for other viral diseases: Secondary | ICD-10-CM

## 2019-07-04 DIAGNOSIS — E43 Unspecified severe protein-calorie malnutrition: Secondary | ICD-10-CM | POA: Diagnosis present

## 2019-07-04 DIAGNOSIS — Z21 Asymptomatic human immunodeficiency virus [HIV] infection status: Secondary | ICD-10-CM | POA: Diagnosis present

## 2019-07-04 DIAGNOSIS — Z95828 Presence of other vascular implants and grafts: Secondary | ICD-10-CM

## 2019-07-04 DIAGNOSIS — L03115 Cellulitis of right lower limb: Secondary | ICD-10-CM | POA: Diagnosis present

## 2019-07-04 MED ORDER — SODIUM CHLORIDE 0.9 % IV BOLUS
1000.0000 mL | Freq: Once | INTRAVENOUS | Status: AC
  Administered 2019-07-05: 1000 mL via INTRAVENOUS

## 2019-07-04 MED ORDER — SODIUM CHLORIDE 0.9 % IV SOLN
INTRAVENOUS | Status: DC
  Administered 2019-07-05 – 2019-07-07 (×6): via INTRAVENOUS

## 2019-07-04 MED ORDER — VANCOMYCIN HCL 10 G IV SOLR
2000.0000 mg | Freq: Once | INTRAVENOUS | Status: AC
  Administered 2019-07-05: 2000 mg via INTRAVENOUS
  Filled 2019-07-04: qty 2000

## 2019-07-04 NOTE — ED Triage Notes (Signed)
Patient was is from home and transported via [facility]. When EMS arrived, patient was sitting in his car and ambulatory to the stretcher. Patient is complaining of right upper thigh cellulites with weeping. He informed EMS that he has had a history with admission for same complaints.
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
## 2019-07-05 ENCOUNTER — Observation Stay (HOSPITAL_COMMUNITY): Payer: Medicaid Other

## 2019-07-05 ENCOUNTER — Other Ambulatory Visit: Payer: Self-pay

## 2019-07-05 DIAGNOSIS — I10 Essential (primary) hypertension: Secondary | ICD-10-CM

## 2019-07-05 DIAGNOSIS — L0390 Cellulitis, unspecified: Secondary | ICD-10-CM | POA: Diagnosis present

## 2019-07-05 DIAGNOSIS — R609 Edema, unspecified: Secondary | ICD-10-CM

## 2019-07-05 DIAGNOSIS — Z1159 Encounter for screening for other viral diseases: Secondary | ICD-10-CM | POA: Diagnosis not present

## 2019-07-05 DIAGNOSIS — K219 Gastro-esophageal reflux disease without esophagitis: Secondary | ICD-10-CM | POA: Diagnosis present

## 2019-07-05 DIAGNOSIS — Z95828 Presence of other vascular implants and grafts: Secondary | ICD-10-CM | POA: Diagnosis not present

## 2019-07-05 DIAGNOSIS — E43 Unspecified severe protein-calorie malnutrition: Secondary | ICD-10-CM | POA: Diagnosis present

## 2019-07-05 DIAGNOSIS — B20 Human immunodeficiency virus [HIV] disease: Secondary | ICD-10-CM

## 2019-07-05 DIAGNOSIS — Z86718 Personal history of other venous thrombosis and embolism: Secondary | ICD-10-CM | POA: Diagnosis not present

## 2019-07-05 DIAGNOSIS — R7303 Prediabetes: Secondary | ICD-10-CM | POA: Diagnosis present

## 2019-07-05 DIAGNOSIS — Z885 Allergy status to narcotic agent status: Secondary | ICD-10-CM | POA: Diagnosis not present

## 2019-07-05 DIAGNOSIS — Z86711 Personal history of pulmonary embolism: Secondary | ICD-10-CM | POA: Diagnosis not present

## 2019-07-05 DIAGNOSIS — Z6841 Body Mass Index (BMI) 40.0 and over, adult: Secondary | ICD-10-CM | POA: Diagnosis not present

## 2019-07-05 DIAGNOSIS — A419 Sepsis, unspecified organism: Secondary | ICD-10-CM | POA: Diagnosis present

## 2019-07-05 DIAGNOSIS — Z21 Asymptomatic human immunodeficiency virus [HIV] infection status: Secondary | ICD-10-CM | POA: Diagnosis present

## 2019-07-05 DIAGNOSIS — Z91018 Allergy to other foods: Secondary | ICD-10-CM | POA: Diagnosis not present

## 2019-07-05 DIAGNOSIS — G629 Polyneuropathy, unspecified: Secondary | ICD-10-CM | POA: Diagnosis present

## 2019-07-05 DIAGNOSIS — L03115 Cellulitis of right lower limb: Secondary | ICD-10-CM

## 2019-07-05 DIAGNOSIS — Z79899 Other long term (current) drug therapy: Secondary | ICD-10-CM | POA: Diagnosis not present

## 2019-07-05 DIAGNOSIS — E669 Obesity, unspecified: Secondary | ICD-10-CM | POA: Diagnosis present

## 2019-07-05 DIAGNOSIS — Z91013 Allergy to seafood: Secondary | ICD-10-CM | POA: Diagnosis not present

## 2019-07-05 LAB — CBC
HCT: 29.4 % — ABNORMAL LOW (ref 39.0–52.0)
Hemoglobin: 8.5 g/dL — ABNORMAL LOW (ref 13.0–17.0)
MCH: 24.1 pg — ABNORMAL LOW (ref 26.0–34.0)
MCHC: 28.9 g/dL — ABNORMAL LOW (ref 30.0–36.0)
MCV: 83.5 fL (ref 80.0–100.0)
Platelets: 312 10*3/uL (ref 150–400)
RBC: 3.52 MIL/uL — ABNORMAL LOW (ref 4.22–5.81)
RDW: 14.9 % (ref 11.5–15.5)
WBC: 17.2 10*3/uL — ABNORMAL HIGH (ref 4.0–10.5)
nRBC: 0 % (ref 0.0–0.2)

## 2019-07-05 LAB — CBC WITH DIFFERENTIAL/PLATELET
Abs Immature Granulocytes: 0.19 10*3/uL — ABNORMAL HIGH (ref 0.00–0.07)
Basophils Absolute: 0 10*3/uL (ref 0.0–0.1)
Basophils Relative: 0 %
Eosinophils Absolute: 0 10*3/uL (ref 0.0–0.5)
Eosinophils Relative: 0 %
HCT: 31.7 % — ABNORMAL LOW (ref 39.0–52.0)
Hemoglobin: 9.3 g/dL — ABNORMAL LOW (ref 13.0–17.0)
Immature Granulocytes: 1 %
Lymphocytes Relative: 5 %
Lymphs Abs: 0.9 10*3/uL (ref 0.7–4.0)
MCH: 24.6 pg — ABNORMAL LOW (ref 26.0–34.0)
MCHC: 29.3 g/dL — ABNORMAL LOW (ref 30.0–36.0)
MCV: 83.9 fL (ref 80.0–100.0)
Monocytes Absolute: 0.2 10*3/uL (ref 0.1–1.0)
Monocytes Relative: 1 %
Neutro Abs: 18.2 10*3/uL — ABNORMAL HIGH (ref 1.7–7.7)
Neutrophils Relative %: 93 %
Platelets: 349 10*3/uL (ref 150–400)
RBC: 3.78 MIL/uL — ABNORMAL LOW (ref 4.22–5.81)
RDW: 14.8 % (ref 11.5–15.5)
WBC: 19.5 10*3/uL — ABNORMAL HIGH (ref 4.0–10.5)
nRBC: 0 % (ref 0.0–0.2)

## 2019-07-05 LAB — COMPREHENSIVE METABOLIC PANEL
ALT: 14 U/L (ref 0–44)
ALT: 13 U/L (ref 0–44)
AST: 15 U/L (ref 15–41)
AST: 11 U/L — ABNORMAL LOW (ref 15–41)
Albumin: 3 g/dL — ABNORMAL LOW (ref 3.5–5.0)
Albumin: 2.6 g/dL — ABNORMAL LOW (ref 3.5–5.0)
Alkaline Phosphatase: 59 U/L (ref 38–126)
Alkaline Phosphatase: 54 U/L (ref 38–126)
Anion gap: 11 (ref 5–15)
Anion gap: 11 (ref 5–15)
BUN: 10 mg/dL (ref 6–20)
BUN: 9 mg/dL (ref 6–20)
CO2: 23 mmol/L (ref 22–32)
CO2: 21 mmol/L — ABNORMAL LOW (ref 22–32)
Calcium: 8.3 mg/dL — ABNORMAL LOW (ref 8.9–10.3)
Calcium: 7.8 mg/dL — ABNORMAL LOW (ref 8.9–10.3)
Chloride: 102 mmol/L (ref 98–111)
Chloride: 105 mmol/L (ref 98–111)
Creatinine, Ser: 1.15 mg/dL (ref 0.61–1.24)
Creatinine, Ser: 0.99 mg/dL (ref 0.61–1.24)
GFR calc Af Amer: >60 mL/min (ref >60)
GFR calc Af Amer: >60 mL/min (ref >60)
GFR calc non Af Amer: >60 mL/min (ref >60)
GFR calc non Af Amer: >60 mL/min (ref >60)
Glucose, Bld: 127 mg/dL — ABNORMAL HIGH (ref 70–99)
Glucose, Bld: 114 mg/dL — ABNORMAL HIGH (ref 70–99)
Potassium: 3.6 mmol/L (ref 3.5–5.1)
Potassium: 3.3 mmol/L — ABNORMAL LOW (ref 3.5–5.1)
Sodium: 136 mmol/L (ref 135–145)
Sodium: 137 mmol/L (ref 135–145)
Total Bilirubin: 0.4 mg/dL (ref 0.3–1.2)
Total Bilirubin: 0.2 mg/dL — ABNORMAL LOW (ref 0.3–1.2)
Total Protein: 8 g/dL (ref 6.5–8.1)
Total Protein: 7.3 g/dL (ref 6.5–8.1)

## 2019-07-05 LAB — SARS CORONAVIRUS 2 BY RT PCR (HOSPITAL ORDER, PERFORMED IN ~~LOC~~ HOSPITAL LAB): SARS Coronavirus 2: NEGATIVE

## 2019-07-05 LAB — LACTIC ACID, PLASMA: Lactic Acid, Venous: 1.6 mmol/L (ref 0.5–1.9)

## 2019-07-05 MED ORDER — CEFAZOLIN SODIUM-DEXTROSE 2-4 GM/100ML-% IV SOLN
2.0000 g | Freq: Three times a day (TID) | INTRAVENOUS | Status: DC
  Administered 2019-07-06 – 2019-07-07 (×4): 2 g via INTRAVENOUS
  Filled 2019-07-05 (×6): qty 100

## 2019-07-05 MED ORDER — FUROSEMIDE 20 MG PO TABS
20.0000 mg | ORAL_TABLET | Freq: Every day | ORAL | Status: DC
  Administered 2019-07-05 – 2019-07-06 (×2): 20 mg via ORAL
  Filled 2019-07-05 (×3): qty 1

## 2019-07-05 MED ORDER — JUVEN PO PACK
1.0000 | PACK | Freq: Two times a day (BID) | ORAL | Status: DC
  Administered 2019-07-06 – 2019-07-07 (×3): 1 via ORAL
  Filled 2019-07-05 (×4): qty 1

## 2019-07-05 MED ORDER — SODIUM CHLORIDE 0.9 % IV SOLN: 1.0000 g | Freq: Every day | INTRAVENOUS | Status: DC

## 2019-07-05 MED ORDER — SODIUM CHLORIDE 0.9% FLUSH: 3.0000 mL | INTRAVENOUS | Status: DC | PRN

## 2019-07-05 MED ORDER — SODIUM CHLORIDE 0.9 % IV SOLN: 250.0000 mL | INTRAVENOUS | Status: DC | PRN

## 2019-07-05 MED ORDER — SODIUM CHLORIDE 0.9 % IV SOLN
2.0000 g | Freq: Every day | INTRAVENOUS | Status: DC
  Administered 2019-07-05: 2 g via INTRAVENOUS
  Filled 2019-07-05: qty 2
  Filled 2019-07-05: qty 20

## 2019-07-05 MED ORDER — ACETAMINOPHEN 650 MG RE SUPP: 650.0000 mg | Freq: Four times a day (QID) | RECTAL | Status: DC | PRN

## 2019-07-05 MED ORDER — PRO-STAT SUGAR FREE PO LIQD
30.0000 mL | Freq: Two times a day (BID) | ORAL | Status: DC
  Administered 2019-07-05 – 2019-07-07 (×5): 30 mL via ORAL
  Filled 2019-07-05 (×5): qty 30

## 2019-07-05 MED ORDER — ENOXAPARIN SODIUM 60 MG/0.6ML ~~LOC~~ SOLN
60.0000 mg | SUBCUTANEOUS | Status: DC
  Filled 2019-07-05: qty 0.6

## 2019-07-05 MED ORDER — ALBUTEROL SULFATE HFA 108 (90 BASE) MCG/ACT IN AERS: 2.0000 | INHALATION_SPRAY | Freq: Four times a day (QID) | RESPIRATORY_TRACT | Status: DC | PRN

## 2019-07-05 MED ORDER — VANCOMYCIN HCL 10 G IV SOLR
1500.0000 mg | Freq: Two times a day (BID) | INTRAVENOUS | Status: DC
  Administered 2019-07-05: 1500 mg via INTRAVENOUS
  Filled 2019-07-05 (×2): qty 1500

## 2019-07-05 MED ORDER — SODIUM CHLORIDE 0.9% FLUSH
3.0000 mL | Freq: Two times a day (BID) | INTRAVENOUS | Status: DC
  Administered 2019-07-05 – 2019-07-07 (×5): 3 mL via INTRAVENOUS

## 2019-07-05 MED ORDER — ACETAMINOPHEN 325 MG PO TABS
650.0000 mg | ORAL_TABLET | Freq: Four times a day (QID) | ORAL | Status: DC | PRN
  Administered 2019-07-05 – 2019-07-06 (×3): 650 mg via ORAL
  Filled 2019-07-05 (×4): qty 2

## 2019-07-05 MED ORDER — BICTEGRAVIR-EMTRICITAB-TENOFOV 50-200-25 MG PO TABS
1.0000 | ORAL_TABLET | Freq: Every day | ORAL | Status: DC
  Administered 2019-07-05 – 2019-07-07 (×3): 1 via ORAL
  Filled 2019-07-05 (×3): qty 1

## 2019-07-05 MED ORDER — ENOXAPARIN SODIUM 80 MG/0.8ML ~~LOC~~ SOLN
0.5000 mg/kg | SUBCUTANEOUS | Status: DC
  Administered 2019-07-05 – 2019-07-06 (×2): 80 mg via SUBCUTANEOUS
  Filled 2019-07-05 (×4): qty 0.8

## 2019-07-05 MED ORDER — ALBUTEROL SULFATE (2.5 MG/3ML) 0.083% IN NEBU: 2.5000 mg | INHALATION_SOLUTION | Freq: Four times a day (QID) | RESPIRATORY_TRACT | Status: DC | PRN

## 2019-07-05 MED ORDER — CEFAZOLIN SODIUM-DEXTROSE 2-4 GM/100ML-% IV SOLN
2.0000 g | Freq: Three times a day (TID) | INTRAVENOUS | Status: DC
  Filled 2019-07-05 (×2): qty 100

## 2019-07-05 NOTE — Progress Notes (Signed)
Pharmacy Antibiotic Note  Todd Parrish is a 38 y.o. male admitted on 07/04/2019 with sepsis.  Pharmacy has been consulted for Vancomycin dosing.  Plan Vancomycin 2gm iv x1 Vancomycin 1500 mg IV Q 12hrs. Goal AUC 400-550. Expected AUC: 537 SCr used: 1.15   Height: 5\' 7"  (170.2 cm) Weight: (!) 345 lb (156.5 kg) IBW/kg (Calculated) : 66.1  Temp (24hrs), Avg:100.5 F (38.1 C), Min:99.8 F (37.7 C), Max:101.2 F (38.4 C)  Recent Labs  Lab 07/05/19 0039  WBC 19.5*  CREATININE 1.15  LATICACIDVEN 1.6    Estimated Creatinine Clearance: 124.8 mL/min (by C-G formula based on SCr of 1.15 mg/dL).    Allergies  Allergen Reactions  . Morphine And Related Nausea And Vomiting and Other (See Comments)    Elevated blood pressure  . Pork-Derived Products Other (See Comments)    - doesn't eat pork  . Shrimp [Shellfish Allergy] Other (See Comments)    -doesn't eat shrimp religious puroses    Antimicrobials this admission: Vancomycin 07/05/2019 >> Ceftriaxone 07/05/2019 >>     Dose adjustments this admission: -  Microbiology results: -  Thank you for allowing pharmacy to be a part of this patient's care.  Nani Skillern Crowford 07/05/2019 6:04 AM

## 2019-07-05 NOTE — ED Provider Notes (Signed)
Assumed care from [place] at shift change.  See prior note for full H&P.  38 year old male here with recurrent cellulitis of the right leg.  This is been worsening over the past 3 days.  Also has history of HIV, last CD4 count in May 2020 was 215.  Plan: Labs and COVID test are pending.  He was given IV vancomycin as patient reports he usually fails outpatient management.  Results for orders placed or performed during the hospital encounter of 07/04/19  CBC with Differential/Platelet  Result Value Ref Range   WBC 19.5 (H) 4.0 - 10.5 K/uL   RBC 3.78 (L) 4.22 - 5.81 MIL/uL   Hemoglobin 9.3 (L) 13.0 - 17.0 g/dL   HCT 31.7 (L) 39.0 - 52.0 %   MCV 83.9 80.0 - 100.0 fL   MCH 24.6 (L) 26.0 - 34.0 pg   MCHC 29.3 (L) 30.0 - 36.0 g/dL   RDW 14.8 11.5 - 15.5 %   Platelets 349 150 - 400 K/uL   nRBC 0.0 0.0 - 0.2 %   Neutrophils Relative % PENDING %   Neutro Abs PENDING 1.7 - 7.7 K/uL   Band Neutrophils PENDING %   Lymphocytes Relative PENDING %   Lymphs Abs PENDING 0.7 - 4.0 K/uL   Monocytes Relative PENDING %   Monocytes Absolute PENDING 0.1 - 1.0 K/uL   Eosinophils Relative PENDING %   Eosinophils Absolute PENDING 0.0 - 0.5 K/uL   Basophils Relative PENDING %   Basophils Absolute PENDING 0.0 - 0.1 K/uL   WBC Morphology PENDING    RBC Morphology PENDING    Smear Review PENDING    Other PENDING %   nRBC PENDING 0 /100 WBC   Metamyelocytes Relative PENDING %   Myelocytes PENDING %   Promyelocytes Relative PENDING %   Blasts PENDING %  Comprehensive metabolic panel  Result Value Ref Range   Sodium 136 135 - 145 mmol/L   Potassium 3.6 3.5 - 5.1 mmol/L   Chloride 102 98 - 111 mmol/L   CO2 23 22 - 32 mmol/L   Glucose, Bld 127 (H) 70 - 99 mg/dL   BUN 10 6 - 20 mg/dL   Creatinine, Ser 1.15 0.61 - 1.24 mg/dL   Calcium 8.3 (L) 8.9 - 10.3 mg/dL   Total Protein 8.0 6.5 - 8.1 g/dL   Albumin 3.0 (L) 3.5 - 5.0 g/dL   AST 15 15 - 41 U/L   ALT 14 0 - 44 U/L   Alkaline Phosphatase 59 38 - 126  U/L   Total Bilirubin 0.4 0.3 - 1.2 mg/dL   GFR calc non Af Amer >60 >60 mL/min   GFR calc Af Amer >60 >60 mL/min   Anion gap 11 5 - 15  Lactic acid, plasma  Result Value Ref Range   Lactic Acid, Venous 1.6 0.5 - 1.9 mmol/L   No results found.  Normal lactate, white count 19.  COVID test is pending but has not had any recent URI symptoms.  COVID test negative.  Discussed with Dr. Maudie Mercury-- will admit for ongoing care.   Larene Pickett, PA-C 07/05/19 0308    Merrily Pew, MD 07/05/19 [phone]
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
## 2019-07-05 NOTE — Progress Notes (Signed)
Lovenox per Pharmacy for DVT Prophylaxis    Pharmacy has been consulted from dosing enoxaparin (lovenox) in this patient for DVT prophylaxis.  The pharmacist has reviewed pertinent labs (Hgb _9.3__; PLT__349_), patient weight (_156__kg) and renal function (CrCl__>90_mL/min) and decided that enoxaparin _60_mg SQ Q24Hrs is appropriate for this patient.  The pharmacy department will sign off at this time.  Please reconsult pharmacy if status changes or for further issues.  Thank you  Cyndia Diver PharmD, BCPS  07/05/2019, 4:11 AM

## 2019-07-05 NOTE — Discharge Instructions (Signed)
Weight Loss Tips  General Tips  Eat at least three times per day.  Pay attention to your body. When you feel like you have had enough to eat, stop. Quit before you feel full, stuffed, or sick from eating. You can have more if you are really hungry.  If you still feel hungry or unsatisfied after a meal or snack, wait at least 10 minutes before you have more food. Often, the craving will go away.  Drink plenty of calorie-free drinks (water, tea, coffee, diet soda). You may be thirsty, not hungry.  Pick lean meats, low-fat or nonfat cheese, and skim (nonfat) or 1% fat milk instead of higher-fat/higher-calorie choices.  Get plenty of fiber. Vegetables, fruits, and whole grains are good sources. Have a highfiber cereal every day.  Cut back on sugar. For example, drink less fruit juice and regular soda.  Limit the amount of alcohol (beer, wine, and liquor) that you drink.  Keep all food in the kitchen. Eat only in a chosen place, such as at the table. Dont eat in the car or the bedroom or in front of the TV.  Food Preparation  Plan meals ahead of time.  Try cooking methods that cut calories: o Cook without adding fat (bake, broil, roast, boil). o Use nonstick cooking sprays instead of butter or oil. You can also use wine, broth, or fruit juice instead of oil when cooking. o Use low-calorie foods instead of high-calorie ones when possible.  Lacinda Axon only what you need for one meal (dont make leftovers).  If you do make extra portions, put them away as soon as they are ready so you can save them for other meals. Store the leftovers in containers that you cant see through.  Cook when you are not hungry. For example, cook and refrigerate tomorrows dinner after you have finished eating tonight.  Make fruits, vegetables, and other low-calorie foods part of each meal.  Drink water while you cook.  Mealtimes  Drink a glass of water before you eat. Drink more during meals.   Use smaller plates, bowls, glasses, and serving spoons.  Divide your plate into four equal parts. Use one part for meat, one for starch (such as pasta, rice, potatoes, or bread), and two for nonstarchy vegetables.  Do not put serving dishes on the table. This will make it harder to take a second portion.  Put salad dressing on the side instead of mixing it with, or pouring onto your salad. Then dip your fork into the dressing before you spear a bite of salad.  Change your usual place at the table.  Make mealtime special by using pretty dishes, napkins, and glasses.  Eat slowly. Take a few one-minute breaks from eating during meals. Put your fork down between bites. Cut your food one bite at a time.  Enjoy fruit for dessert instead of cake, pie, or other sweets.  Leave a little food on your plate. (You control the food; it doesnt control you.)  Remove your plate as soon as youve finished eating.  If theres no good use for leftovers, throw them out!  Snacking Snacking can be part of your plan for healthy weight loss. You can eat six times per day as long as you plan what to eat and dont eat too many calories.  Plan ahead. Be sure to have healthy snacks on hand. If the right food is not there, you may be more likely to eat whatever is available, such as candy, cookies, chips,  leftovers, or other quick choices.  Keep low-calorie snacks in a special part of the refrigerator. Good choices include the following: o Reduced-fat string cheese, low-calorie yogurt, and nonfat milk. o Washed, bite-size pieces of raw vegetables, such as carrots, celery, pepper strips, cucumbers, broccoli, and cauliflower. Serve with low-calorie dips. o Fresh fruit.  -From the Academy of Nutrition and Dietetics
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
## 2019-07-05 NOTE — Progress Notes (Signed)
Bilateral lower extremity venous duplex has been completed. Preliminary results can be found in CV Proc through chart review.   07/05/19 8:48 AM Todd Parrish RVT

## 2019-07-05 NOTE — Progress Notes (Signed)
A consult was received from an ED physician for Vancomycin per pharmacy dosing.  The patient's profile has been reviewed for ht/wt/allergies/indication/available labs.   A one time order has been placed for Vancomycin 2gm iv x1.  Further antibiotics/pharmacy consults should be ordered by admitting physician if indicated.                       Thank you, Nani Skillern Crowford 07/05/2019  12:00 AM

## 2019-07-06 LAB — CBC
HCT: 34.2 % — ABNORMAL LOW (ref 39.0–52.0)
Hemoglobin: 9.7 g/dL — ABNORMAL LOW (ref 13.0–17.0)
MCH: 24 pg — ABNORMAL LOW (ref 26.0–34.0)
MCHC: 28.4 g/dL — ABNORMAL LOW (ref 30.0–36.0)
MCV: 84.7 fL (ref 80.0–100.0)
Platelets: 327 10*3/uL (ref 150–400)
RBC: 4.04 MIL/uL — ABNORMAL LOW (ref 4.22–5.81)
RDW: 15 % (ref 11.5–15.5)
WBC: 20.5 10*3/uL — ABNORMAL HIGH (ref 4.0–10.5)
nRBC: 0 % (ref 0.0–0.2)

## 2019-07-06 LAB — COMPREHENSIVE METABOLIC PANEL
ALT: 12 U/L (ref 0–44)
AST: 14 U/L — ABNORMAL LOW (ref 15–41)
Albumin: 2.7 g/dL — ABNORMAL LOW (ref 3.5–5.0)
Alkaline Phosphatase: 61 U/L (ref 38–126)
Anion gap: 10 (ref 5–15)
BUN: 9 mg/dL (ref 6–20)
CO2: 22 mmol/L (ref 22–32)
Calcium: 8.2 mg/dL — ABNORMAL LOW (ref 8.9–10.3)
Chloride: 104 mmol/L (ref 98–111)
Creatinine, Ser: 1.02 mg/dL (ref 0.61–1.24)
GFR calc Af Amer: >60 mL/min (ref >60)
GFR calc non Af Amer: >60 mL/min (ref >60)
Glucose, Bld: 104 mg/dL — ABNORMAL HIGH (ref 70–99)
Potassium: 3.7 mmol/L (ref 3.5–5.1)
Sodium: 136 mmol/L (ref 135–145)
Total Bilirubin: 0.6 mg/dL (ref 0.3–1.2)
Total Protein: 8.3 g/dL — ABNORMAL HIGH (ref 6.5–8.1)
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
## 2019-07-06 NOTE — Progress Notes (Signed)
PT Cancellation Note  Patient Details Name: Todd Parrish MRN: [id number] DOB: 09-Aug-1981   Cancelled Treatment:    Reason Eval/Treat Not Completed: Fatigue/lethargy limiting ability to participate. Pt sleeping and politely asked PT to come back later stating he didn't get much sleep. Will check back as schedule permits.   Galen Manila 07/06/2019, 9:17 AM
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
## 2019-07-07 LAB — CBC
HCT: 29.2 % — ABNORMAL LOW (ref 39.0–52.0)
Hemoglobin: 8.7 g/dL — ABNORMAL LOW (ref 13.0–17.0)
MCH: 24.4 pg — ABNORMAL LOW (ref 26.0–34.0)
MCHC: 29.8 g/dL — ABNORMAL LOW (ref 30.0–36.0)
MCV: 81.8 fL (ref 80.0–100.0)
Platelets: 304 10*3/uL (ref 150–400)
RBC: 3.57 MIL/uL — ABNORMAL LOW (ref 4.22–5.81)
RDW: 15 % (ref 11.5–15.5)
WBC: 14 10*3/uL — ABNORMAL HIGH (ref 4.0–10.5)
nRBC: 0 % (ref 0.0–0.2)

## 2019-07-07 MED ORDER — CEPHALEXIN 250 MG PO CAPS: 250.0000 mg | ORAL_CAPSULE | Freq: Four times a day (QID) | ORAL | 0 refills | Status: DC

## 2019-07-07 MED FILL — CEPHALEXIN 250 MG CAPS: 250 | 11 days supply | Qty: 44 | Fill #0
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
## 2019-07-10 LAB — CULTURE, BLOOD (ROUTINE X 2)
Culture: NO GROWTH
Special Requests: ADEQUATE

## 2019-07-14 ENCOUNTER — Other Ambulatory Visit: Payer: Self-pay

## 2019-07-14 ENCOUNTER — Inpatient Hospital Stay (HOSPITAL_COMMUNITY)
Admission: EM | Admit: 2019-07-14 | Discharge: 2019-07-17 | DRG: 603 | Disposition: A | Discharge status: Home / Self Care | Payer: Medicaid Other | Attending: Internal Medicine | Admitting: Internal Medicine

## 2019-07-14 ENCOUNTER — Encounter (HOSPITAL_COMMUNITY): Payer: Self-pay | Admitting: Emergency Medicine

## 2019-07-14 DIAGNOSIS — Z86711 Personal history of pulmonary embolism: Secondary | ICD-10-CM

## 2019-07-14 DIAGNOSIS — Z95828 Presence of other vascular implants and grafts: Secondary | ICD-10-CM

## 2019-07-14 DIAGNOSIS — L03115 Cellulitis of right lower limb: Secondary | ICD-10-CM

## 2019-07-14 DIAGNOSIS — L03119 Cellulitis of unspecified part of limb: Secondary | ICD-10-CM | POA: Diagnosis present

## 2019-07-14 DIAGNOSIS — Z86718 Personal history of other venous thrombosis and embolism: Secondary | ICD-10-CM

## 2019-07-14 DIAGNOSIS — R609 Edema, unspecified: Secondary | ICD-10-CM | POA: Diagnosis present

## 2019-07-14 DIAGNOSIS — Z20828 Contact with and (suspected) exposure to other viral communicable diseases: Secondary | ICD-10-CM | POA: Diagnosis present

## 2019-07-14 DIAGNOSIS — B20 Human immunodeficiency virus [HIV] disease: Secondary | ICD-10-CM | POA: Diagnosis present

## 2019-07-14 DIAGNOSIS — I10 Essential (primary) hypertension: Secondary | ICD-10-CM | POA: Diagnosis present

## 2019-07-14 DIAGNOSIS — I890 Lymphedema, not elsewhere classified: Secondary | ICD-10-CM | POA: Diagnosis present

## 2019-07-14 MED ORDER — KETOROLAC TROMETHAMINE 15 MG/ML IJ SOLN
15.0000 mg | Freq: Once | INTRAMUSCULAR | Status: AC
  Administered 2019-07-15: 15 mg via INTRAVENOUS
  Filled 2019-07-14: qty 1
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
## 2019-07-14 NOTE — ED Triage Notes (Signed)
Pt arrived via [facility] with complaint of Right leg pain, stated he had cellulitis and was previously admitted last week for 3 days. Pt stated to EMS that antibiotics and lasix have not helped. Pt also stated leg swelling increasing.

## 2019-07-15 ENCOUNTER — Encounter (HOSPITAL_COMMUNITY): Payer: Self-pay | Admitting: Internal Medicine

## 2019-07-15 DIAGNOSIS — Z86711 Personal history of pulmonary embolism: Secondary | ICD-10-CM | POA: Diagnosis not present

## 2019-07-15 DIAGNOSIS — L03119 Cellulitis of unspecified part of limb: Secondary | ICD-10-CM | POA: Diagnosis not present

## 2019-07-15 DIAGNOSIS — Z95828 Presence of other vascular implants and grafts: Secondary | ICD-10-CM | POA: Diagnosis not present

## 2019-07-15 DIAGNOSIS — I890 Lymphedema, not elsewhere classified: Secondary | ICD-10-CM | POA: Diagnosis present

## 2019-07-15 DIAGNOSIS — I10 Essential (primary) hypertension: Secondary | ICD-10-CM | POA: Diagnosis present

## 2019-07-15 DIAGNOSIS — R609 Edema, unspecified: Secondary | ICD-10-CM | POA: Diagnosis not present

## 2019-07-15 DIAGNOSIS — B20 Human immunodeficiency virus [HIV] disease: Secondary | ICD-10-CM | POA: Diagnosis present

## 2019-07-15 DIAGNOSIS — Z86718 Personal history of other venous thrombosis and embolism: Secondary | ICD-10-CM | POA: Diagnosis not present

## 2019-07-15 DIAGNOSIS — L03115 Cellulitis of right lower limb: Secondary | ICD-10-CM | POA: Diagnosis present

## 2019-07-15 DIAGNOSIS — Z20828 Contact with and (suspected) exposure to other viral communicable diseases: Secondary | ICD-10-CM | POA: Diagnosis present

## 2019-07-15 LAB — CBC WITH DIFFERENTIAL/PLATELET
Abs Immature Granulocytes: 0.12 10*3/uL — ABNORMAL HIGH (ref 0.00–0.07)
Basophils Absolute: 0 10*3/uL (ref 0.0–0.1)
Basophils Relative: 0 %
Eosinophils Absolute: 0.1 10*3/uL (ref 0.0–0.5)
Eosinophils Relative: 1 %
HCT: 27.5 % — ABNORMAL LOW (ref 39.0–52.0)
Hemoglobin: 8.2 g/dL — ABNORMAL LOW (ref 13.0–17.0)
Immature Granulocytes: 1 %
Lymphocytes Relative: 19 %
Lymphs Abs: 2.2 10*3/uL (ref 0.7–4.0)
MCH: 24.3 pg — ABNORMAL LOW (ref 26.0–34.0)
MCHC: 29.8 g/dL — ABNORMAL LOW (ref 30.0–36.0)
MCV: 81.6 fL (ref 80.0–100.0)
Monocytes Absolute: 0.8 10*3/uL (ref 0.1–1.0)
Monocytes Relative: 7 %
Neutro Abs: 8.3 10*3/uL — ABNORMAL HIGH (ref 1.7–7.7)
Neutrophils Relative %: 72 %
Platelets: 471 10*3/uL — ABNORMAL HIGH (ref 150–400)
RBC: 3.37 MIL/uL — ABNORMAL LOW (ref 4.22–5.81)
RDW: 14.9 % (ref 11.5–15.5)
WBC: 11.4 10*3/uL — ABNORMAL HIGH (ref 4.0–10.5)
nRBC: 0 % (ref 0.0–0.2)

## 2019-07-15 LAB — COMPREHENSIVE METABOLIC PANEL
ALT: 12 U/L (ref 0–44)
AST: 11 U/L — ABNORMAL LOW (ref 15–41)
Albumin: 2.4 g/dL — ABNORMAL LOW (ref 3.5–5.0)
Alkaline Phosphatase: 48 U/L (ref 38–126)
Anion gap: 7 (ref 5–15)
BUN: 9 mg/dL (ref 6–20)
CO2: 24 mmol/L (ref 22–32)
Calcium: 7.7 mg/dL — ABNORMAL LOW (ref 8.9–10.3)
Chloride: 107 mmol/L (ref 98–111)
Creatinine, Ser: 0.93 mg/dL (ref 0.61–1.24)
GFR calc Af Amer: >60 mL/min (ref >60)
GFR calc non Af Amer: >60 mL/min (ref >60)
Glucose, Bld: 96 mg/dL (ref 70–99)
Potassium: 3.5 mmol/L (ref 3.5–5.1)
Sodium: 138 mmol/L (ref 135–145)
Total Bilirubin: 0.6 mg/dL (ref 0.3–1.2)
Total Protein: 8 g/dL (ref 6.5–8.1)

## 2019-07-15 LAB — BASIC METABOLIC PANEL WITH GFR
Anion gap: 10 (ref 5–15)
BUN: 10 mg/dL (ref 6–20)
CO2: 25 mmol/L (ref 22–32)
Calcium: 8.3 mg/dL — ABNORMAL LOW (ref 8.9–10.3)
Chloride: 102 mmol/L (ref 98–111)
Creatinine, Ser: 1.05 mg/dL (ref 0.61–1.24)
GFR calc Af Amer: >60 mL/min (ref >60)
GFR calc non Af Amer: >60 mL/min (ref >60)
Glucose, Bld: 113 mg/dL — ABNORMAL HIGH (ref 70–99)
Potassium: 3.6 mmol/L (ref 3.5–5.1)
Sodium: 137 mmol/L (ref 135–145)

## 2019-07-15 LAB — CBC
HCT: 25 % — ABNORMAL LOW (ref 39.0–52.0)
Hemoglobin: 7.4 g/dL — ABNORMAL LOW (ref 13.0–17.0)
MCH: 24.3 pg — ABNORMAL LOW (ref 26.0–34.0)
MCHC: 29.6 g/dL — ABNORMAL LOW (ref 30.0–36.0)
MCV: 82 fL (ref 80.0–100.0)
Platelets: 440 10*3/uL — ABNORMAL HIGH (ref 150–400)
RBC: 3.05 MIL/uL — ABNORMAL LOW (ref 4.22–5.81)
RDW: 14.8 % (ref 11.5–15.5)
WBC: 10.8 10*3/uL — ABNORMAL HIGH (ref 4.0–10.5)
nRBC: 0 % (ref 0.0–0.2)

## 2019-07-15 LAB — LACTIC ACID, PLASMA: Lactic Acid, Venous: 0.7 mmol/L (ref 0.5–1.9)

## 2019-07-15 LAB — SEDIMENTATION RATE: Sed Rate: 135 mm/hr — ABNORMAL HIGH (ref 0–16)

## 2019-07-15 LAB — SARS CORONAVIRUS 2 BY RT PCR (HOSPITAL ORDER, PERFORMED IN ~~LOC~~ HOSPITAL LAB): SARS Coronavirus 2: NEGATIVE

## 2019-07-15 MED ORDER — SODIUM CHLORIDE 0.9% FLUSH
3.0000 mL | Freq: Two times a day (BID) | INTRAVENOUS | Status: DC
  Administered 2019-07-15: 3 mL via INTRAVENOUS

## 2019-07-15 MED ORDER — ALBUTEROL SULFATE (2.5 MG/3ML) 0.083% IN NEBU: 3.0000 mL | INHALATION_SOLUTION | Freq: Four times a day (QID) | RESPIRATORY_TRACT | Status: DC | PRN

## 2019-07-15 MED ORDER — VANCOMYCIN HCL 10 G IV SOLR
2500.0000 mg | Freq: Once | INTRAVENOUS | Status: AC
  Administered 2019-07-15: 2500 mg via INTRAVENOUS
  Filled 2019-07-15: qty 2000

## 2019-07-15 MED ORDER — VANCOMYCIN HCL 10 G IV SOLR
1500.0000 mg | Freq: Two times a day (BID) | INTRAVENOUS | Status: DC
  Administered 2019-07-15 – 2019-07-17 (×4): 1500 mg via INTRAVENOUS
  Filled 2019-07-15 (×4): qty 1500

## 2019-07-15 MED ORDER — SODIUM CHLORIDE 0.9 % IV SOLN
2.0000 g | INTRAVENOUS | Status: DC
  Administered 2019-07-15 – 2019-07-17 (×3): 2 g via INTRAVENOUS
  Filled 2019-07-15 (×3): qty 2

## 2019-07-15 MED ORDER — ACETAMINOPHEN 650 MG RE SUPP: 650.0000 mg | Freq: Four times a day (QID) | RECTAL | Status: DC | PRN

## 2019-07-15 MED ORDER — ENOXAPARIN SODIUM 40 MG/0.4ML ~~LOC~~ SOLN
40.0000 mg | SUBCUTANEOUS | Status: DC
  Administered 2019-07-15 – 2019-07-16 (×2): 40 mg via SUBCUTANEOUS
  Filled 2019-07-15 (×2): qty 0.4

## 2019-07-15 MED ORDER — SODIUM CHLORIDE 0.9% FLUSH: 3.0000 mL | INTRAVENOUS | Status: DC | PRN

## 2019-07-15 MED ORDER — SODIUM CHLORIDE 0.9 % IV SOLN: 250.0000 mL | INTRAVENOUS | Status: DC | PRN

## 2019-07-15 MED ORDER — PRO-STAT SUGAR FREE PO LIQD
30.0000 mL | Freq: Two times a day (BID) | ORAL | Status: DC
  Administered 2019-07-15 – 2019-07-17 (×5): 30 mL via ORAL
  Filled 2019-07-15 (×6): qty 30

## 2019-07-15 MED ORDER — SODIUM CHLORIDE 0.9 % IV SOLN
2.0000 g | Freq: Three times a day (TID) | INTRAVENOUS | Status: DC
  Administered 2019-07-15: 2 g via INTRAVENOUS
  Filled 2019-07-15 (×2): qty 2

## 2019-07-15 MED ORDER — BICTEGRAVIR-EMTRICITAB-TENOFOV 50-200-25 MG PO TABS
1.0000 | ORAL_TABLET | Freq: Every day | ORAL | Status: DC
  Administered 2019-07-15 – 2019-07-16 (×2): 1 via ORAL
  Filled 2019-07-15 (×2): qty 1

## 2019-07-15 MED ORDER — FUROSEMIDE 10 MG/ML IJ SOLN
20.0000 mg | Freq: Every day | INTRAMUSCULAR | Status: DC
  Administered 2019-07-15 – 2019-07-16 (×2): 20 mg via INTRAVENOUS
  Filled 2019-07-15 (×3): qty 2

## 2019-07-15 MED ORDER — ACETAMINOPHEN 325 MG PO TABS
650.0000 mg | ORAL_TABLET | Freq: Four times a day (QID) | ORAL | Status: DC | PRN
  Administered 2019-07-17: 650 mg via ORAL
  Filled 2019-07-15: qty 2

## 2019-07-15 MED ORDER — KETOROLAC TROMETHAMINE 15 MG/ML IJ SOLN
INTRAMUSCULAR | Status: AC
  Administered 2019-07-15
  Filled 2019-07-15: qty 1
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
## 2019-07-15 NOTE — Progress Notes (Signed)
Pharmacy Antibiotic Note  Todd Parrish is a 38 y.o. male admitted on 07/14/2019 with cellulitis.  Pharmacy has been consulted for vancomycin dosing.  Plan: Vancomycin 2500 mg x1 then 1500 mg IV q12h for est AUC = 486 Use scr = 1.05, Vd=0.5 Goal AUC = 400-550 Cefepime 2 Gm IV q8h F/u scr/cultures/levels  Height: 5\' 7"  (170.2 cm) Weight: (!) 350 lb (158.8 kg) IBW/kg (Calculated) : 66.1  Temp (24hrs), Avg:97.4 F (36.3 C), Min:97.4 F (36.3 C), Max:97.4 F (36.3 C)  Recent Labs  Lab 07/15/19 0002  WBC 11.4*  CREATININE 1.05  LATICACIDVEN 0.7    Estimated Creatinine Clearance: 137.9 mL/min (by C-G formula based on SCr of 1.05 mg/dL).    Allergies  Allergen Reactions  . Morphine And Related Nausea And Vomiting and Other (See Comments)    Elevated blood pressure  . Pork-Derived Products Other (See Comments)    - doesn't eat pork  . Shrimp [Shellfish Allergy] Other (See Comments)    -doesn't eat shrimp religious puroses    Antimicrobials this admission: 7/25 cefepime >>  7/25 vancomycin >>   Dose adjustments this admission:   Microbiology results:  BCx:   UCx:    Sputum:    MRSA PCR:   Thank you for allowing pharmacy to be a part of this patient's care.  Dorrene German 07/15/2019 1:58 AM
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
## 2019-07-16 LAB — COMPREHENSIVE METABOLIC PANEL
ALT: 11 U/L (ref 0–44)
AST: 11 U/L — ABNORMAL LOW (ref 15–41)
Albumin: 2.3 g/dL — ABNORMAL LOW (ref 3.5–5.0)
Alkaline Phosphatase: 49 U/L (ref 38–126)
Anion gap: 7 (ref 5–15)
BUN: 10 mg/dL (ref 6–20)
CO2: 26 mmol/L (ref 22–32)
Calcium: 8.1 mg/dL — ABNORMAL LOW (ref 8.9–10.3)
Chloride: 105 mmol/L (ref 98–111)
Creatinine, Ser: 1.05 mg/dL (ref 0.61–1.24)
GFR calc Af Amer: >60 mL/min (ref >60)
GFR calc non Af Amer: >60 mL/min (ref >60)
Glucose, Bld: 108 mg/dL — ABNORMAL HIGH (ref 70–99)
Potassium: 3.8 mmol/L (ref 3.5–5.1)
Sodium: 138 mmol/L (ref 135–145)
Total Bilirubin: 0.4 mg/dL (ref 0.3–1.2)
Total Protein: 8.4 g/dL — ABNORMAL HIGH (ref 6.5–8.1)

## 2019-07-16 LAB — CBC
HCT: 26 % — ABNORMAL LOW (ref 39.0–52.0)
Hemoglobin: 7.5 g/dL — ABNORMAL LOW (ref 13.0–17.0)
MCH: 23.7 pg — ABNORMAL LOW (ref 26.0–34.0)
MCHC: 28.8 g/dL — ABNORMAL LOW (ref 30.0–36.0)
MCV: 82.3 fL (ref 80.0–100.0)
Platelets: 463 10*3/uL — ABNORMAL HIGH (ref 150–400)
RBC: 3.16 MIL/uL — ABNORMAL LOW (ref 4.22–5.81)
RDW: 14.6 % (ref 11.5–15.5)
WBC: 8.7 10*3/uL (ref 4.0–10.5)
nRBC: 0 % (ref 0.0–0.2)

## 2019-07-16 MED ORDER — ENSURE ENLIVE PO LIQD: 237.0000 mL | Freq: Two times a day (BID) | ORAL | Status: DC

## 2019-07-16 MED ORDER — BICTEGRAVIR-EMTRICITAB-TENOFOV 50-200-25 MG PO TABS: 1.0000 | ORAL_TABLET | Freq: Every day | ORAL | Status: DC

## 2019-07-16 MED ORDER — ENOXAPARIN SODIUM 80 MG/0.8ML ~~LOC~~ SOLN
0.5000 mg/kg | SUBCUTANEOUS | Status: DC
  Filled 2019-07-16: qty 0.8

## 2019-07-16 MED ORDER — ENOXAPARIN SODIUM 40 MG/0.4ML ~~LOC~~ SOLN: 40.0000 mg | Freq: Two times a day (BID) | SUBCUTANEOUS | Status: DC

## 2019-07-16 MED ORDER — ENSURE MAX PROTEIN PO LIQD
11.0000 [oz_av] | Freq: Every day | ORAL | Status: DC
  Administered 2019-07-16: 11 [oz_av] via ORAL
  Filled 2019-07-16 (×2): qty 330

## 2019-07-16 NOTE — Plan of Care (Signed)
  Problem: Education: Goal: Knowledge of General Education information will improve Description: Including pain rating scale, medication(s)/side effects and non-pharmacologic comfort measures Outcome: Progressing   Problem: Health Behavior/Discharge Planning: Goal: Ability to manage health-related needs will improve Outcome: Progressing   Problem: Clinical Measurements: Goal: Ability to maintain clinical measurements within normal limits will improve Outcome: Progressing Goal: Will remain free from infection Outcome: Progressing Goal: Diagnostic test results will improve Outcome: Progressing Goal: Respiratory complications will improve Outcome: Progressing Goal: Cardiovascular complication will be avoided Outcome: Progressing   Problem: Nutrition: Goal: Adequate nutrition will be maintained Outcome: Progressing   Problem: Coping: Goal: Level of anxiety will decrease Outcome: Progressing   Problem: Elimination: Goal: Will not experience complications related to urinary retention Outcome: Progressing   Problem: Pain Managment: Goal: General experience of comfort will improve Outcome: Progressing   Problem: Safety: Goal: Ability to remain free from injury will improve Outcome: Progressing   Problem: Skin Integrity: Goal: Risk for impaired skin integrity will decrease Outcome: Progressing   
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
## 2019-07-16 NOTE — Plan of Care (Signed)

## 2019-07-17 LAB — CBC WITH DIFFERENTIAL/PLATELET
Abs Immature Granulocytes: 0.07 10*3/uL (ref 0.00–0.07)
Basophils Absolute: 0 10*3/uL (ref 0.0–0.1)
Basophils Relative: 0 %
Eosinophils Absolute: 0.1 10*3/uL (ref 0.0–0.5)
Eosinophils Relative: 1 %
HCT: 27.7 % — ABNORMAL LOW (ref 39.0–52.0)
Hemoglobin: 8.2 g/dL — ABNORMAL LOW (ref 13.0–17.0)
Immature Granulocytes: 1 %
Lymphocytes Relative: 22 %
Lymphs Abs: 1.9 10*3/uL (ref 0.7–4.0)
MCH: 23.9 pg — ABNORMAL LOW (ref 26.0–34.0)
MCHC: 29.6 g/dL — ABNORMAL LOW (ref 30.0–36.0)
MCV: 80.8 fL (ref 80.0–100.0)
Monocytes Absolute: 0.7 10*3/uL (ref 0.1–1.0)
Monocytes Relative: 8 %
Neutro Abs: 6 10*3/uL (ref 1.7–7.7)
Neutrophils Relative %: 68 %
Platelets: 429 10*3/uL — ABNORMAL HIGH (ref 150–400)
RBC: 3.43 MIL/uL — ABNORMAL LOW (ref 4.22–5.81)
RDW: 14.8 % (ref 11.5–15.5)
WBC: 8.7 10*3/uL (ref 4.0–10.5)
nRBC: 0 % (ref 0.0–0.2)

## 2019-07-17 MED ORDER — CLINDAMYCIN HCL 300 MG PO CAPS: 300.0000 mg | ORAL_CAPSULE | Freq: Three times a day (TID) | ORAL | 0 refills | Status: AC

## 2019-07-17 MED FILL — CLINDAMYCIN HCL 300 MG CAPS: 300 | 10 days supply | Qty: 30 | Fill #0
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
## 2019-07-17 NOTE — Progress Notes (Signed)
PT Cancellation Note  Patient Details Name: Todd Parrish MRN: [id number] DOB: 06-07-1981   Cancelled Treatment:    Reason Eval/Treat Not Completed: PT screened, no needs identified, will sign off(pt reports he's been able to ambulate in the room without difficulty, he stated he is independent with mobility and doesn't need PT at present. Will sign off.)   Philomena Doheny PT 07/17/2019  Acute Rehabilitation Services Pager [phone] Office [phone]

## 2019-07-20 LAB — CULTURE, BLOOD (ROUTINE X 2)
Culture: NO GROWTH
Culture: NO GROWTH
Special Requests: ADEQUATE

## 2019-08-02 MED FILL — BIKTARVY 50-200-25 MG TABS: 50-200-25 | 30 days supply | Qty: 30 | Fill #0

## 2019-08-30 MED FILL — BIKTARVY 50-200-25 MG TABS: 50-200-25 | 30 days supply | Qty: 30 | Fill #1

## 2019-09-08 MED FILL — BIKTARVY 50-200-25 MG TABS: 50-200-25 | 30 days supply | Qty: 30 | Fill #1

## 2019-10-06 MED FILL — BIKTARVY 50-200-25 MG TABS: 50-200-25 | 30 days supply | Qty: 30 | Fill #2

## 2019-10-30 MED FILL — BIKTARVY 50-200-25 MG TABS: 50-200-25 | 30 days supply | Qty: 30 | Fill #2

## 2019-11-27 MED FILL — BIKTARVY 50-200-25 MG TABS: 50-200-25 | 30 days supply | Qty: 30 | Fill #3

## 2019-12-12 MED FILL — BIKTARVY 50-200-25 MG TABS: 50-200-25 | 30 days supply | Qty: 30 | Fill #3

## 2020-01-09 MED FILL — BIKTARVY 50-200-25 MG TABS: 50-200-25 | 30 days supply | Qty: 30 | Fill #4

## 2020-01-19 MED FILL — BIKTARVY 50-200-25 MG TABS: 50-200-25 | 30 days supply | Qty: 30 | Fill #4

## 2020-02-19 MED FILL — BIKTARVY 50-200-25 MG TABS: 50-200-25 | 30 days supply | Qty: 30 | Fill #5

## 2020-03-12 ENCOUNTER — Other Ambulatory Visit: Payer: Self-pay | Admitting: Infectious Diseases

## 2020-03-12 DIAGNOSIS — B20 Human immunodeficiency virus [HIV] disease: Secondary | ICD-10-CM

## 2020-03-20 ENCOUNTER — Telehealth: Payer: Self-pay | Admitting: Pharmacy Technician

## 2020-03-20 NOTE — Telephone Encounter (Signed)
[facility] Patient Advocate Encounter  Patient reached out to pharmacy for a medication refill to pick up on 04/01 but has no refills remaining. He last no showed 04/2019 appointment and will need an appointment. He last received scripts 12/12/2019, 01/19/2020 and 02/19/2020. Unable to leave a voicemail because voicemail was full.

## 2020-04-04 ENCOUNTER — Other Ambulatory Visit: Payer: Self-pay | Admitting: Infectious Diseases

## 2020-04-04 DIAGNOSIS — B20 Human immunodeficiency virus [HIV] disease: Secondary | ICD-10-CM

## 2020-04-05 MED FILL — BIKTARVY 50-200-25 MG TABS: 50-200-25 | 30 days supply | Qty: 30 | Fill #0

## 2020-04-08 ENCOUNTER — Other Ambulatory Visit: Payer: Self-pay

## 2020-04-08 ENCOUNTER — Other Ambulatory Visit (HOSPITAL_COMMUNITY)
Admission: RE | Admit: 2020-04-08 | Discharge: 2020-04-08 | Disposition: A | Discharge status: Home / Self Care | Payer: Medicaid Other | Source: Ambulatory Visit | Attending: Infectious Diseases | Admitting: Infectious Diseases

## 2020-04-08 ENCOUNTER — Other Ambulatory Visit: Payer: Medicaid Other

## 2020-04-08 DIAGNOSIS — B20 Human immunodeficiency virus [HIV] disease: Secondary | ICD-10-CM

## 2020-04-08 DIAGNOSIS — Z79899 Other long term (current) drug therapy: Secondary | ICD-10-CM

## 2020-04-08 DIAGNOSIS — Z113 Encounter for screening for infections with a predominantly sexual mode of transmission: Secondary | ICD-10-CM

## 2020-04-09 LAB — URINE CYTOLOGY ANCILLARY ONLY
Chlamydia: NEGATIVE
Comment: NEGATIVE
Comment: NORMAL
Neisseria Gonorrhea: NEGATIVE

## 2020-04-09 LAB — T-HELPER CELL (CD4) - (RCID CLINIC ONLY)
CD4 % Helper T Cell: 46 % (ref 33–65)
CD4 T Cell Abs: 707 /uL (ref 400–1790)

## 2020-04-10 NOTE — Progress Notes (Signed)
Pt needs IM benzathine Pen G 2.4 million units times 1 please

## 2020-04-11 ENCOUNTER — Telehealth: Payer: Self-pay

## 2020-04-11 LAB — CBC WITH DIFFERENTIAL/PLATELET
Absolute Monocytes: 397 cells/uL (ref 200–950)
Basophils Absolute: 31 cells/uL (ref 0–200)
Basophils Relative: 0.5 %
Eosinophils Absolute: 128 cells/uL (ref 15–500)
Eosinophils Relative: 2.1 %
HCT: 37.8 % — ABNORMAL LOW (ref 38.5–50.0)
Hemoglobin: 11 g/dL — ABNORMAL LOW (ref 13.2–17.1)
Lymphs Abs: 1983 cells/uL (ref 850–3900)
MCH: 23.2 pg — ABNORMAL LOW (ref 27.0–33.0)
MCHC: 29.1 g/dL — ABNORMAL LOW (ref 32.0–36.0)
MCV: 79.6 fL — ABNORMAL LOW (ref 80.0–100.0)
MPV: 11.8 fL (ref 7.5–12.5)
Monocytes Relative: 6.5 %
Neutro Abs: 3562 cells/uL (ref 1500–7800)
Neutrophils Relative %: 58.4 %
Platelets: 347 10*3/uL (ref 140–400)
RBC: 4.75 10*6/uL (ref 4.20–5.80)
RDW: 13.6 % (ref 11.0–15.0)
Total Lymphocyte: 32.5 %
WBC: 6.1 10*3/uL (ref 3.8–10.8)

## 2020-04-11 LAB — COMPREHENSIVE METABOLIC PANEL
AG Ratio: 0.7 (calc) — ABNORMAL LOW (ref 1.0–2.5)
ALT: 9 U/L (ref 9–46)
AST: 12 U/L (ref 10–40)
Albumin: 3.5 g/dL — ABNORMAL LOW (ref 3.6–5.1)
Alkaline phosphatase (APISO): 67 U/L (ref 36–130)
BUN: 9 mg/dL (ref 7–25)
CO2: 24 mmol/L (ref 20–32)
Calcium: 8.7 mg/dL (ref 8.6–10.3)
Chloride: 104 mmol/L (ref 98–110)
Creat: 0.88 mg/dL (ref 0.60–1.35)
Globulin: 4.8 g/dL (calc) — ABNORMAL HIGH (ref 1.9–3.7)
Glucose, Bld: 107 mg/dL — ABNORMAL HIGH (ref 65–99)
Potassium: 4.2 mmol/L (ref 3.5–5.3)
Sodium: 138 mmol/L (ref 135–146)
Total Bilirubin: 0.2 mg/dL (ref 0.2–1.2)
Total Protein: 8.3 g/dL — ABNORMAL HIGH (ref 6.1–8.1)

## 2020-04-11 LAB — LIPID PANEL
Cholesterol: 153 mg/dL (ref <200)
HDL: 38 mg/dL — ABNORMAL LOW (ref >=40)
LDL Cholesterol (Calc): 100 mg/dL (calc) — ABNORMAL HIGH
Non-HDL Cholesterol (Calc): 115 mg/dL (calc) (ref <130)
Total CHOL/HDL Ratio: 4 (calc) (ref <5.0)
Triglycerides: 60 mg/dL (ref <150)

## 2020-04-11 LAB — FLUORESCENT TREPONEMAL AB(FTA)-IGG-BLD: Fluorescent Treponemal ABS: REACTIVE — AB

## 2020-04-11 LAB — RPR: RPR Ser Ql: REACTIVE — AB

## 2020-04-11 LAB — RPR TITER: RPR Titer: 1:128 {titer} — ABNORMAL HIGH

## 2020-04-11 LAB — HIV-1 RNA QUANT-NO REFLEX-BLD
HIV 1 RNA Quant: <20 copies/mL
HIV-1 RNA Quant, Log: <1.3 Log copies/mL

## 2020-04-11 NOTE — Telephone Encounter (Signed)
-----   Message from Ginnie Smart, MD sent at 04/10/2020  4:49 PM EDT ----- Pt needs IM benzathine Pen G 2.4 million units times 1 please

## 2020-04-11 NOTE — Telephone Encounter (Signed)
Patient notified of test results. Scheduled 4/23 @ 10:30 for treatment.

## 2020-04-12 ENCOUNTER — Ambulatory Visit (INDEPENDENT_AMBULATORY_CARE_PROVIDER_SITE_OTHER): Payer: Medicaid Other

## 2020-04-12 ENCOUNTER — Other Ambulatory Visit: Payer: Self-pay

## 2020-04-12 DIAGNOSIS — A539 Syphilis, unspecified: Secondary | ICD-10-CM

## 2020-04-12 MED ORDER — PENICILLIN G BENZATHINE 1200000 UNIT/2ML IM SUSP
1.2000 10*6.[IU] | Freq: Once | INTRAMUSCULAR | Status: AC
  Administered 2020-04-12: 1.2 10*6.[IU] via INTRAMUSCULAR

## 2020-04-23 ENCOUNTER — Other Ambulatory Visit: Payer: Self-pay

## 2020-04-23 ENCOUNTER — Ambulatory Visit (INDEPENDENT_AMBULATORY_CARE_PROVIDER_SITE_OTHER): Payer: Medicaid Other | Admitting: Infectious Diseases

## 2020-04-23 ENCOUNTER — Other Ambulatory Visit: Payer: Self-pay | Admitting: Infectious Diseases

## 2020-04-23 ENCOUNTER — Encounter: Payer: Self-pay | Admitting: Infectious Diseases

## 2020-04-23 DIAGNOSIS — K4090 Unilateral inguinal hernia, without obstruction or gangrene, not specified as recurrent: Secondary | ICD-10-CM

## 2020-04-23 DIAGNOSIS — B20 Human immunodeficiency virus [HIV] disease: Secondary | ICD-10-CM | POA: Diagnosis not present

## 2020-04-23 DIAGNOSIS — I10 Essential (primary) hypertension: Secondary | ICD-10-CM | POA: Diagnosis not present

## 2020-04-23 DIAGNOSIS — L03115 Cellulitis of right lower limb: Secondary | ICD-10-CM

## 2020-04-23 MED ORDER — ORLISTAT 120 MG PO CAPS: 120.0000 mg | ORAL_CAPSULE | Freq: Three times a day (TID) | ORAL | 3 refills | Status: DC

## 2020-04-23 MED ORDER — BIKTARVY 50-200-25 MG PO TABS: 1.0000 | ORAL_TABLET | Freq: Every day | ORAL | 3 refills | Status: DC

## 2020-04-23 NOTE — Assessment & Plan Note (Signed)
Will send him to surgery 

## 2020-04-23 NOTE — Assessment & Plan Note (Signed)
He is doing well aside from wt gain Refuses COVID vax rtc in 6 months.  Offered/refused condoms.

## 2020-04-23 NOTE — Assessment & Plan Note (Signed)
Encouraged him to keep elevated, wear compression stockings.
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
## 2020-04-23 NOTE — Assessment & Plan Note (Signed)
Will recheck at his f/u visit.  He does not want to take more lasix unless leg swells.

## 2020-05-24 MED FILL — BIKTARVY 50-200-25 MG TABS: 50-200-25 | 90 days supply | Qty: 90 | Fill #0

## 2020-06-07 DIAGNOSIS — L03115 Cellulitis of right lower limb: Secondary | ICD-10-CM | POA: Diagnosis not present

## 2020-06-20 DIAGNOSIS — Z419 Encounter for procedure for purposes other than remedying health state, unspecified: Secondary | ICD-10-CM | POA: Diagnosis not present

## 2020-07-20 DIAGNOSIS — R52 Pain, unspecified: Secondary | ICD-10-CM | POA: Diagnosis not present

## 2020-07-21 ENCOUNTER — Other Ambulatory Visit: Payer: Self-pay

## 2020-07-21 ENCOUNTER — Encounter (HOSPITAL_COMMUNITY): Payer: Self-pay | Admitting: Emergency Medicine

## 2020-07-21 ENCOUNTER — Observation Stay (HOSPITAL_COMMUNITY): Payer: Medicaid Other

## 2020-07-21 ENCOUNTER — Inpatient Hospital Stay (HOSPITAL_COMMUNITY)
Admission: EM | Admit: 2020-07-21 | Discharge: 2020-07-24 | DRG: 603 | Disposition: A | Discharge status: Home / Self Care | Payer: Medicaid Other | Attending: Internal Medicine | Admitting: Internal Medicine

## 2020-07-21 DIAGNOSIS — Z91013 Allergy to seafood: Secondary | ICD-10-CM

## 2020-07-21 DIAGNOSIS — Z79899 Other long term (current) drug therapy: Secondary | ICD-10-CM

## 2020-07-21 DIAGNOSIS — Z419 Encounter for procedure for purposes other than remedying health state, unspecified: Secondary | ICD-10-CM | POA: Diagnosis not present

## 2020-07-21 DIAGNOSIS — Z21 Asymptomatic human immunodeficiency virus [HIV] infection status: Secondary | ICD-10-CM | POA: Diagnosis present

## 2020-07-21 DIAGNOSIS — K219 Gastro-esophageal reflux disease without esophagitis: Secondary | ICD-10-CM | POA: Diagnosis present

## 2020-07-21 DIAGNOSIS — D638 Anemia in other chronic diseases classified elsewhere: Secondary | ICD-10-CM | POA: Diagnosis present

## 2020-07-21 DIAGNOSIS — Z6841 Body Mass Index (BMI) 40.0 and over, adult: Secondary | ICD-10-CM | POA: Diagnosis present

## 2020-07-21 DIAGNOSIS — Z885 Allergy status to narcotic agent status: Secondary | ICD-10-CM

## 2020-07-21 DIAGNOSIS — R700 Elevated erythrocyte sedimentation rate: Secondary | ICD-10-CM | POA: Diagnosis present

## 2020-07-21 DIAGNOSIS — L03119 Cellulitis of unspecified part of limb: Secondary | ICD-10-CM | POA: Diagnosis present

## 2020-07-21 DIAGNOSIS — I10 Essential (primary) hypertension: Secondary | ICD-10-CM | POA: Diagnosis present

## 2020-07-21 DIAGNOSIS — G629 Polyneuropathy, unspecified: Secondary | ICD-10-CM

## 2020-07-21 DIAGNOSIS — Z20822 Contact with and (suspected) exposure to covid-19: Secondary | ICD-10-CM | POA: Diagnosis present

## 2020-07-21 DIAGNOSIS — D509 Iron deficiency anemia, unspecified: Secondary | ICD-10-CM | POA: Diagnosis present

## 2020-07-21 DIAGNOSIS — R7982 Elevated C-reactive protein (CRP): Secondary | ICD-10-CM | POA: Diagnosis present

## 2020-07-21 DIAGNOSIS — Z91018 Allergy to other foods: Secondary | ICD-10-CM

## 2020-07-21 DIAGNOSIS — E538 Deficiency of other specified B group vitamins: Secondary | ICD-10-CM | POA: Diagnosis present

## 2020-07-21 DIAGNOSIS — L0390 Cellulitis, unspecified: Secondary | ICD-10-CM | POA: Diagnosis present

## 2020-07-21 DIAGNOSIS — L03115 Cellulitis of right lower limb: Principal | ICD-10-CM | POA: Diagnosis present

## 2020-07-21 DIAGNOSIS — L02419 Cutaneous abscess of limb, unspecified: Secondary | ICD-10-CM | POA: Diagnosis present

## 2020-07-21 DIAGNOSIS — B20 Human immunodeficiency virus [HIV] disease: Secondary | ICD-10-CM | POA: Diagnosis present

## 2020-07-21 DIAGNOSIS — M7989 Other specified soft tissue disorders: Secondary | ICD-10-CM | POA: Diagnosis not present

## 2020-07-21 DIAGNOSIS — I890 Lymphedema, not elsewhere classified: Secondary | ICD-10-CM

## 2020-07-21 LAB — COMPREHENSIVE METABOLIC PANEL
ALT: 13 U/L (ref 0–44)
AST: 22 U/L (ref 15–41)
Albumin: 3.6 g/dL (ref 3.5–5.0)
Alkaline Phosphatase: 66 U/L (ref 38–126)
Anion gap: 12 (ref 5–15)
BUN: 16 mg/dL (ref 6–20)
CO2: 22 mmol/L (ref 22–32)
Calcium: 8.8 mg/dL — ABNORMAL LOW (ref 8.9–10.3)
Chloride: 104 mmol/L (ref 98–111)
Creatinine, Ser: 0.88 mg/dL (ref 0.61–1.24)
GFR calc Af Amer: >60 mL/min (ref >60)
GFR calc non Af Amer: >60 mL/min (ref >60)
Glucose, Bld: 104 mg/dL — ABNORMAL HIGH (ref 70–99)
Potassium: 4.9 mmol/L (ref 3.5–5.1)
Sodium: 138 mmol/L (ref 135–145)
Total Bilirubin: 1.2 mg/dL (ref 0.3–1.2)
Total Protein: 9.2 g/dL — ABNORMAL HIGH (ref 6.5–8.1)

## 2020-07-21 LAB — CBC WITH DIFFERENTIAL/PLATELET
Abs Immature Granulocytes: 0.07 10*3/uL (ref 0.00–0.07)
Basophils Absolute: 0 10*3/uL (ref 0.0–0.1)
Basophils Relative: 0 %
Eosinophils Absolute: 0.1 10*3/uL (ref 0.0–0.5)
Eosinophils Relative: 1 %
HCT: 34.1 % — ABNORMAL LOW (ref 39.0–52.0)
Hemoglobin: 10.1 g/dL — ABNORMAL LOW (ref 13.0–17.0)
Immature Granulocytes: 1 %
Lymphocytes Relative: 21 %
Lymphs Abs: 2.1 10*3/uL (ref 0.7–4.0)
MCH: 24.1 pg — ABNORMAL LOW (ref 26.0–34.0)
MCHC: 29.6 g/dL — ABNORMAL LOW (ref 30.0–36.0)
MCV: 81.4 fL (ref 80.0–100.0)
Monocytes Absolute: 1.1 10*3/uL — ABNORMAL HIGH (ref 0.1–1.0)
Monocytes Relative: 11 %
Neutro Abs: 6.9 10*3/uL (ref 1.7–7.7)
Neutrophils Relative %: 66 %
Platelets: 337 10*3/uL (ref 150–400)
RBC: 4.19 MIL/uL — ABNORMAL LOW (ref 4.22–5.81)
RDW: 15.2 % (ref 11.5–15.5)
WBC: 10.4 10*3/uL (ref 4.0–10.5)
nRBC: 0 % (ref 0.0–0.2)

## 2020-07-21 LAB — SARS CORONAVIRUS 2 BY RT PCR (HOSPITAL ORDER, PERFORMED IN ~~LOC~~ HOSPITAL LAB): SARS Coronavirus 2: NEGATIVE

## 2020-07-21 MED ORDER — ALBUTEROL SULFATE (2.5 MG/3ML) 0.083% IN NEBU: 2.5000 mg | INHALATION_SOLUTION | Freq: Four times a day (QID) | RESPIRATORY_TRACT | Status: DC | PRN

## 2020-07-21 MED ORDER — ENOXAPARIN SODIUM 40 MG/0.4ML ~~LOC~~ SOLN: 40.0000 mg | SUBCUTANEOUS | Status: DC

## 2020-07-21 MED ORDER — ONDANSETRON HCL 4 MG/2ML IJ SOLN: 4.0000 mg | Freq: Four times a day (QID) | INTRAMUSCULAR | Status: DC | PRN

## 2020-07-21 MED ORDER — BISACODYL 5 MG PO TBEC: 5.0000 mg | DELAYED_RELEASE_TABLET | Freq: Every day | ORAL | Status: DC | PRN

## 2020-07-21 MED ORDER — ACETAMINOPHEN 325 MG PO TABS
650.0000 mg | ORAL_TABLET | Freq: Four times a day (QID) | ORAL | Status: DC | PRN
  Administered 2020-07-22 – 2020-07-23 (×4): 650 mg via ORAL
  Filled 2020-07-21 (×4): qty 2

## 2020-07-21 MED ORDER — BICTEGRAVIR-EMTRICITAB-TENOFOV 50-200-25 MG PO TABS
1.0000 | ORAL_TABLET | Freq: Every day | ORAL | Status: DC
  Administered 2020-07-22 – 2020-07-23 (×2): 1 via ORAL
  Filled 2020-07-21 (×3): qty 1

## 2020-07-21 MED ORDER — VANCOMYCIN HCL 2000 MG/400ML IV SOLN
2000.0000 mg | Freq: Once | INTRAVENOUS | Status: AC
  Administered 2020-07-21: 2000 mg via INTRAVENOUS
  Filled 2020-07-21: qty 400

## 2020-07-21 MED ORDER — ACETAMINOPHEN 650 MG RE SUPP: 650.0000 mg | Freq: Four times a day (QID) | RECTAL | Status: DC | PRN

## 2020-07-21 MED ORDER — SODIUM CHLORIDE 0.9 % IV SOLN: INTRAVENOUS | Status: DC | PRN

## 2020-07-21 MED ORDER — VANCOMYCIN HCL 1500 MG/300ML IV SOLN
1500.0000 mg | Freq: Two times a day (BID) | INTRAVENOUS | Status: DC
  Administered 2020-07-21 – 2020-07-23 (×4): 1500 mg via INTRAVENOUS
  Filled 2020-07-21 (×4): qty 300

## 2020-07-21 MED ORDER — POLYETHYLENE GLYCOL 3350 17 G PO PACK: 17.0000 g | PACK | Freq: Every day | ORAL | Status: DC | PRN

## 2020-07-21 MED ORDER — VANCOMYCIN HCL 10 G IV SOLR
2500.0000 mg | Freq: Once | INTRAVENOUS | Status: DC
  Filled 2020-07-21: qty 2500

## 2020-07-21 MED ORDER — ENOXAPARIN SODIUM 80 MG/0.8ML ~~LOC~~ SOLN
70.0000 mg | Freq: Every day | SUBCUTANEOUS | Status: DC
  Administered 2020-07-22: 70 mg via SUBCUTANEOUS
  Filled 2020-07-21: qty 0.8
  Filled 2020-07-21: qty 0.7
  Filled 2020-07-21: qty 0.8

## 2020-07-21 MED ORDER — ONDANSETRON HCL 4 MG PO TABS: 4.0000 mg | ORAL_TABLET | Freq: Four times a day (QID) | ORAL | Status: DC | PRN

## 2020-07-21 NOTE — ED Notes (Signed)
Patient to room via w/c with c/o " I think I have cellulitis in my right leg".
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
## 2020-07-21 NOTE — ED Notes (Signed)
Lab states they need a recollect on purple top. Triage made aware.

## 2020-07-21 NOTE — Progress Notes (Signed)
Pts R lower leg is draining from 2 separate locations. White drainage from both openings noted. Leg washed with soap & water, areas covered with 4x4&ABD pad &  wrapped with Kerlix.

## 2020-07-21 NOTE — Progress Notes (Signed)
Pharmacy Antibiotic Note  Todd Parrish is a 39 y.o. male with a h/o HIV, lymphedema, and recurrent cellulitis admitted on 07/21/2020 with purulent cellulitis after a 10 day course of antibiotics (clindamycin) about 3 weeks ago.  Pharmacy has been consulted for vancomycin dosing.  Plan: Vancomycin 2000 mg iv once followed by 1500 mg iv q 12 hours.  Predicted AUC 447  F/U renal function, clinical course, plans for antibiotics  Levels if indicated  Height: 5\' 7"  (170.2 cm) Weight: (!) 147.4 kg (325 lb) IBW/kg (Calculated) : 66.1  Temp (24hrs), Avg:97.8 F (36.6 C), Min:97.7 F (36.5 C), Max:97.9 F (36.6 C)  Recent Labs  Lab 07/21/20 0041 07/21/20 0610  WBC  --  10.4  CREATININE 0.88  --     Estimated Creatinine Clearance: 155.6 mL/min (by C-G formula based on SCr of 0.88 mg/dL).    Allergies  Allergen Reactions  . Morphine And Related Nausea And Vomiting and Other (See Comments)    Elevated blood pressure Other reaction(s): Other Elevated blood pressure  . Pork-Derived Products Other (See Comments)    - doesn't eat pork Other reaction(s): Other - doesn't eat pork  . Shellfish Allergy Other (See Comments)    -doesn't eat shrimp religious puroses Other reaction(s): Other -doesn't eat shrimp religious puroses    Antimicrobials this admission: 8/1 vancomycin >>   Dose adjustments this admission:  Microbiology results: 8/1 COVID: neg  Thank you for allowing pharmacy to be a part of this patient's care.  10/1 07/21/2020 8:38 AM

## 2020-07-21 NOTE — Progress Notes (Addendum)
Pharmacy Note   A consult was received from an ED physician for vancomycin per pharmacy dosing.    The patient's profile has been reviewed for ht/wt/allergies/indication/available labs.    A one time order has been placed for vancomycin 2000 mg IV x1 .     Further antibiotics/pharmacy consults should be ordered by admitting physician if indicated.                       Thank you,  Adalberto Cole, PharmD, BCPS 07/21/2020 5:23 AM

## 2020-07-21 NOTE — ED Notes (Signed)
Pt given lunch tray. Aox4, NAD

## 2020-07-21 NOTE — ED Triage Notes (Signed)
Patient is complaining of leg swelling and right knee pain. Patient states this started 4 weeks ago.
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
## 2020-07-21 NOTE — Progress Notes (Signed)
Report given to Fleet Contras, RN on [address].

## 2020-07-21 NOTE — ED Notes (Signed)
Report to floor

## 2020-07-22 DIAGNOSIS — L03119 Cellulitis of unspecified part of limb: Secondary | ICD-10-CM | POA: Diagnosis present

## 2020-07-22 DIAGNOSIS — L03115 Cellulitis of right lower limb: Secondary | ICD-10-CM | POA: Diagnosis not present

## 2020-07-22 DIAGNOSIS — D638 Anemia in other chronic diseases classified elsewhere: Secondary | ICD-10-CM | POA: Diagnosis not present

## 2020-07-22 DIAGNOSIS — Z20822 Contact with and (suspected) exposure to covid-19: Secondary | ICD-10-CM | POA: Diagnosis not present

## 2020-07-22 DIAGNOSIS — Z6841 Body Mass Index (BMI) 40.0 and over, adult: Secondary | ICD-10-CM | POA: Diagnosis not present

## 2020-07-22 DIAGNOSIS — R7982 Elevated C-reactive protein (CRP): Secondary | ICD-10-CM | POA: Diagnosis present

## 2020-07-22 DIAGNOSIS — L02419 Cutaneous abscess of limb, unspecified: Secondary | ICD-10-CM | POA: Diagnosis not present

## 2020-07-22 DIAGNOSIS — E538 Deficiency of other specified B group vitamins: Secondary | ICD-10-CM | POA: Diagnosis not present

## 2020-07-22 DIAGNOSIS — D509 Iron deficiency anemia, unspecified: Secondary | ICD-10-CM | POA: Diagnosis not present

## 2020-07-22 DIAGNOSIS — M7989 Other specified soft tissue disorders: Secondary | ICD-10-CM | POA: Diagnosis not present

## 2020-07-22 DIAGNOSIS — Z885 Allergy status to narcotic agent status: Secondary | ICD-10-CM | POA: Diagnosis not present

## 2020-07-22 DIAGNOSIS — Z21 Asymptomatic human immunodeficiency virus [HIV] infection status: Secondary | ICD-10-CM | POA: Diagnosis not present

## 2020-07-22 DIAGNOSIS — Z91013 Allergy to seafood: Secondary | ICD-10-CM | POA: Diagnosis not present

## 2020-07-22 DIAGNOSIS — R700 Elevated erythrocyte sedimentation rate: Secondary | ICD-10-CM | POA: Diagnosis present

## 2020-07-22 DIAGNOSIS — G629 Polyneuropathy, unspecified: Secondary | ICD-10-CM | POA: Diagnosis not present

## 2020-07-22 DIAGNOSIS — I10 Essential (primary) hypertension: Secondary | ICD-10-CM | POA: Diagnosis not present

## 2020-07-22 DIAGNOSIS — Z79899 Other long term (current) drug therapy: Secondary | ICD-10-CM | POA: Diagnosis not present

## 2020-07-22 DIAGNOSIS — I890 Lymphedema, not elsewhere classified: Secondary | ICD-10-CM | POA: Diagnosis not present

## 2020-07-22 DIAGNOSIS — B20 Human immunodeficiency virus [HIV] disease: Secondary | ICD-10-CM | POA: Diagnosis not present

## 2020-07-22 DIAGNOSIS — Z91018 Allergy to other foods: Secondary | ICD-10-CM | POA: Diagnosis not present

## 2020-07-22 DIAGNOSIS — K219 Gastro-esophageal reflux disease without esophagitis: Secondary | ICD-10-CM | POA: Diagnosis not present

## 2020-07-22 LAB — CBC
HCT: 32.3 % — ABNORMAL LOW (ref 39.0–52.0)
Hemoglobin: 9.9 g/dL — ABNORMAL LOW (ref 13.0–17.0)
MCH: 24.5 pg — ABNORMAL LOW (ref 26.0–34.0)
MCHC: 30.7 g/dL (ref 30.0–36.0)
MCV: 80 fL (ref 80.0–100.0)
Platelets: 309 10*3/uL (ref 150–400)
RBC: 4.04 MIL/uL — ABNORMAL LOW (ref 4.22–5.81)
RDW: 15.2 % (ref 11.5–15.5)
WBC: 9 10*3/uL (ref 4.0–10.5)
nRBC: 0 % (ref 0.0–0.2)

## 2020-07-22 LAB — COMPREHENSIVE METABOLIC PANEL
ALT: 10 U/L (ref 0–44)
AST: 11 U/L — ABNORMAL LOW (ref 15–41)
Albumin: 3 g/dL — ABNORMAL LOW (ref 3.5–5.0)
Alkaline Phosphatase: 54 U/L (ref 38–126)
Anion gap: 9 (ref 5–15)
BUN: 15 mg/dL (ref 6–20)
CO2: 24 mmol/L (ref 22–32)
Calcium: 8.6 mg/dL — ABNORMAL LOW (ref 8.9–10.3)
Chloride: 104 mmol/L (ref 98–111)
Creatinine, Ser: 0.86 mg/dL (ref 0.61–1.24)
GFR calc Af Amer: >60 mL/min (ref >60)
GFR calc non Af Amer: >60 mL/min (ref >60)
Glucose, Bld: 108 mg/dL — ABNORMAL HIGH (ref 70–99)
Potassium: 3.9 mmol/L (ref 3.5–5.1)
Sodium: 137 mmol/L (ref 135–145)
Total Bilirubin: 0.6 mg/dL (ref 0.3–1.2)
Total Protein: 8.1 g/dL (ref 6.5–8.1)

## 2020-07-22 LAB — MRSA PCR SCREENING: MRSA by PCR: NEGATIVE

## 2020-07-22 LAB — C-REACTIVE PROTEIN: CRP: 9.2 mg/dL — ABNORMAL HIGH (ref <1.0)

## 2020-07-22 LAB — SEDIMENTATION RATE: Sed Rate: 117 mm/hr — ABNORMAL HIGH (ref 0–16)

## 2020-07-22 MED ORDER — BENZOCAINE 10 % MT GEL
Freq: Four times a day (QID) | OROMUCOSAL | Status: DC | PRN
  Filled 2020-07-22: qty 9.4

## 2020-07-22 NOTE — Progress Notes (Signed)
[facility] Progress Note  Patient: Todd Parrish    TKW:[id number]  DOA: 07/21/2020     Date of Service: the patient was seen and examined on 07/22/2020  Brief hospital course: Past medical history of HIV and chronic lymphedema.  Presents with complaints of leg pain.  Treated with antibiotic outpatient Currently plan is continue IV biotics for cellulitis.  Assessment and Plan: 1.  Cellulitis of the right lower extremity. Serosanguineous drainage, no pus. History of recurrent cellulitis. Complete 10-day course of antibiotic 3 weeks ago without any improvement. Currently on IV vancomycin which I will continue. Check MRSA PCR. Check ESR and CRP. If no improvement.  Will check tomorrow CT scan of the right leg.  2.  History of HIV. CD4 count adequate. Viral count undetectable. Continue home regimen.  3.  Chronic lymphedema. Outpatient follow-up.  4.  Morbid obesity Body mass index is 50.9 kg/m.  Will benefit from weight loss and lifestyle modification outpatient. Follow-up with PCP.  5.  Chronic iron deficiency anemia No active bleeding. H&H stable.  Monitor.  Diet: Cardiac diet DVT Prophylaxis: Subcutaneous Heparin       Advance goals of care discussion: Full code  Family Communication: family was present at bedside, at the time of interview.  The pt provided permission to discuss medical plan with the family. Opportunity was given to ask question and all questions were answered satisfactorily.   Disposition:  Status is: Inpatient  Remains inpatient appropriate because:IV treatments appropriate due to intensity of illness or inability to take PO   Dispo: The patient is from: Home              Anticipated d/c is to: Home              Anticipated d/c date is: 2 days              Patient currently is not medically stable to d/c.  Subjective: Continues to have mild pain.  No nausea no vomiting.  Complains of a serosanguineous discharge.  No fever no  chills.  Physical Exam:  General: Appear in mild distress, no Rash; Oral Mucosa Clear, moist. no Abnormal Neck Mass Or lumps, Conjunctiva normal  Cardiovascular: S1 and S2 Present, no Murmur, Respiratory: good respiratory effort, Bilateral Air entry present and Clear to Auscultation, no Crackles, no wheezes Abdomen: Bowel Sound present, Soft and no tenderness Extremities: Chronic bilateral lymphedema, right more than left pedal edema, no calf tenderness Neurology: alert and oriented to time, place, and person affect appropriate. no new focal deficit Gait not checked due to patient safety concerns  Vitals:   07/21/20 1805 07/21/20 2148 07/22/20 0555 07/22/20 1358  BP: (!) 130/66 (!) 133/73 (!) 138/75 (!) 144/70  Pulse: 90 92 64 74  Resp: '18 20 19 18  ' Temp: 98.3 F (36.8 C) 97.9 F (36.6 C) 97.7 F (36.5 C) 98.8 F (37.1 C)  TempSrc: Oral   Oral  SpO2: 99% 98% 99% 99%  Weight:      Height:        Intake/Output Summary (Last 24 hours) at 07/22/2020 1924 Last data filed at 07/22/2020 1411 Gross per 24 hour  Intake 750 ml  Output 1950 ml  Net -1200 ml   Filed Weights   07/21/20 0012  Weight: (!) 147.4 kg    Data Reviewed: I have personally reviewed and interpreted daily labs, tele strips, imagings as discussed above. I reviewed all nursing notes, pharmacy notes, vitals, pertinent old records I have discussed  plan of care as described above with RN and patient/family.  CBC: Recent Labs  Lab 07/21/20 0610 07/22/20 0438  WBC 10.4 9.0  NEUTROABS 6.9  --   HGB 10.1* 9.9*  HCT 34.1* 32.3*  MCV 81.4 80.0  PLT 337 676   Basic Metabolic Panel: Recent Labs  Lab 07/21/20 0041 07/22/20 0438  NA 138 137  K 4.9 3.9  CL 104 104  CO2 22 24  GLUCOSE 104* 108*  BUN 16 15  CREATININE 0.88 0.86  CALCIUM 8.8* 8.6*    Studies: No results found.  Scheduled Meds:  bictegravir-emtricitabine-tenofovir AF  1 tablet Oral Daily   enoxaparin (LOVENOX) injection  70 mg  Subcutaneous Daily   Continuous Infusions:  sodium chloride     vancomycin 1,500 mg (07/22/20 1751)   PRN Meds: sodium chloride, acetaminophen **OR** acetaminophen, albuterol, benzocaine, bisacodyl, ondansetron **OR** ondansetron (ZOFRAN) IV, polyethylene glycol  Time spent: 35 minutes  Author: Berle Mull, MD [facility] 07/22/2020 7:24 PM  To reach On-call, see care teams to locate the attending and reach out via [email or web address]. Between 7PM-7AM, please contact night-coverage If you still have difficulty reaching the attending provider, please page the [facility] (Director on Call) for [facility] on amion for assistance.

## 2020-07-22 NOTE — Progress Notes (Signed)
Washed right lower leg with NS and pat dry. Gauze and ABD pad applied and secured loosely with Kerlix. Patient tolerated well. States no pain to area at this time.

## 2020-07-23 MED ORDER — CEFAZOLIN SODIUM-DEXTROSE 2-4 GM/100ML-% IV SOLN
2.0000 g | Freq: Three times a day (TID) | INTRAVENOUS | Status: DC
  Administered 2020-07-23 – 2020-07-24 (×4): 2 g via INTRAVENOUS
  Filled 2020-07-23 (×5): qty 100

## 2020-07-23 NOTE — Progress Notes (Signed)
Rt leg continues to drain serous & white milky drainage from medial & lateral openings.  Warm compress applied, cleaned & dried.Covered with 4x4 & Kerlix

## 2020-07-23 NOTE — Progress Notes (Signed)
[facility] Progress Note  Patient: Todd Parrish    CZY:[id number]  DOA: 07/21/2020     Date of Service: the patient was seen and examined on 07/23/2020  Brief hospital course: Past medical history of HIV and chronic lymphedema.  Presents with complaints of leg pain.  Treated with antibiotic outpatient Currently plan is continue IV biotics for cellulitis.  Assessment and Plan: 1.  Cellulitis of the right lower extremity. Serosanguineous drainage, no pus. History of recurrent cellulitis. Complete 10-day course of antibiotic 3 weeks ago without any improvement. Initially on IV vancomycin.  Negative MRSA PCR.  Change to IV cefazolin. Significantly elevated ESR and CRP. If no improvement.  Will check CT scan of the right leg.  2.  History of HIV. CD4 count adequate. Viral count undetectable. Continue home regimen.  3.  Chronic lymphedema. Outpatient follow-up.  4.  Morbid obesity Body mass index is 50.9 kg/m.  Will benefit from weight loss and lifestyle modification outpatient. Follow-up with PCP.  5.  Chronic iron deficiency anemia No active bleeding. H&H stable.  Monitor.  Diet: Cardiac diet DVT Prophylaxis: Subcutaneous Heparin     Advance goals of care discussion: Full code  Family Communication: no family was present at bedside, at the time of interview.   Disposition:  Status is: Inpatient  Remains inpatient appropriate because:IV treatments appropriate due to intensity of illness or inability to take PO   Dispo: The patient is from: Home              Anticipated d/c is to: Home              Anticipated d/c date is: 2 days              Patient currently is not medically stable to d/c.  Subjective: No nausea no vomiting.  No fever no chills.  No chest pain.  Pain well controlled.  Physical Exam:  General: Appear in mild distress, no Rash; Oral Mucosa Clear, moist. no Abnormal Neck Mass Or lumps, Conjunctiva normal  Cardiovascular: S1 and S2  Present, no Murmur, Respiratory: good respiratory effort, Bilateral Air entry present and Clear to Auscultation, no Crackles, no wheezes Abdomen: Bowel Sound present, Soft and no tenderness Extremities: Chronic bilateral lymphedema, right more than left pedal edema, no calf tenderness Neurology: alert and oriented to time, place, and person affect appropriate. no new focal deficit Gait not checked due to patient safety concerns  Vitals:   07/22/20 1358 07/22/20 2114 07/23/20 0439 07/23/20 1410  BP: (!) 144/70 124/69 109/70 130/76  Pulse: 74 93 74 78  Resp: '18 19 19 20  ' Temp: 98.8 F (37.1 C) 98.1 F (36.7 C) 98.1 F (36.7 C) 98.4 F (36.9 C)  TempSrc: Oral Oral  Oral  SpO2: 99% 100% 98% 99%  Weight:      Height:        Intake/Output Summary (Last 24 hours) at 07/23/2020 1729 Last data filed at 07/23/2020 0300 Gross per 24 hour  Intake --  Output 1500 ml  Net -1500 ml   Filed Weights   07/21/20 0012  Weight: (!) 147.4 kg    Data Reviewed: I have personally reviewed and interpreted daily labs, tele strips, imagings as discussed above. I reviewed all nursing notes, pharmacy notes, vitals, pertinent old records I have discussed plan of care as described above with RN and patient/family.  CBC: Recent Labs  Lab 07/21/20 0610 07/22/20 0438  WBC 10.4 9.0  NEUTROABS 6.9  --  HGB 10.1* 9.9*  HCT 34.1* 32.3*  MCV 81.4 80.0  PLT 337 471   Basic Metabolic Panel: Recent Labs  Lab 07/21/20 0041 07/22/20 0438  NA 138 137  K 4.9 3.9  CL 104 104  CO2 22 24  GLUCOSE 104* 108*  BUN 16 15  CREATININE 0.88 0.86  CALCIUM 8.8* 8.6*    Studies: No results found.  Scheduled Meds: . bictegravir-emtricitabine-tenofovir AF  1 tablet Oral Daily  . enoxaparin (LOVENOX) injection  70 mg Subcutaneous Daily   Continuous Infusions: . sodium chloride    .  ceFAZolin (ANCEF) IV 2 g (07/23/20 0842)   PRN Meds: sodium chloride, acetaminophen **OR** acetaminophen, albuterol,  benzocaine, bisacodyl, ondansetron **OR** ondansetron (ZOFRAN) IV, polyethylene glycol  Time spent: 35 minutes  Author: Berle Mull, MD [facility] 07/23/2020 5:29 PM  To reach On-call, see care teams to locate the attending and reach out via [email or web address]. Between 7PM-7AM, please contact night-coverage If you still have difficulty reaching the attending provider, please page the [facility] (Director on Call) for [facility] on amion for assistance.

## 2020-07-24 DIAGNOSIS — I10 Essential (primary) hypertension: Secondary | ICD-10-CM | POA: Diagnosis not present

## 2020-07-24 DIAGNOSIS — B20 Human immunodeficiency virus [HIV] disease: Secondary | ICD-10-CM | POA: Diagnosis not present

## 2020-07-24 DIAGNOSIS — L03115 Cellulitis of right lower limb: Secondary | ICD-10-CM | POA: Diagnosis not present

## 2020-07-24 LAB — CBC WITH DIFFERENTIAL/PLATELET
Abs Immature Granulocytes: 0.09 10*3/uL — ABNORMAL HIGH (ref 0.00–0.07)
Basophils Absolute: 0 10*3/uL (ref 0.0–0.1)
Basophils Relative: 0 %
Eosinophils Absolute: 0.2 10*3/uL (ref 0.0–0.5)
Eosinophils Relative: 3 %
HCT: 34 % — ABNORMAL LOW (ref 39.0–52.0)
Hemoglobin: 10.1 g/dL — ABNORMAL LOW (ref 13.0–17.0)
Immature Granulocytes: 1 %
Lymphocytes Relative: 25 %
Lymphs Abs: 2.2 10*3/uL (ref 0.7–4.0)
MCH: 24 pg — ABNORMAL LOW (ref 26.0–34.0)
MCHC: 29.7 g/dL — ABNORMAL LOW (ref 30.0–36.0)
MCV: 80.8 fL (ref 80.0–100.0)
Monocytes Absolute: 0.6 10*3/uL (ref 0.1–1.0)
Monocytes Relative: 7 %
Neutro Abs: 5.5 10*3/uL (ref 1.7–7.7)
Neutrophils Relative %: 64 %
Platelets: 355 10*3/uL (ref 150–400)
RBC: 4.21 MIL/uL — ABNORMAL LOW (ref 4.22–5.81)
RDW: 15.1 % (ref 11.5–15.5)
WBC: 8.7 10*3/uL (ref 4.0–10.5)
nRBC: 0 % (ref 0.0–0.2)

## 2020-07-24 LAB — COMPREHENSIVE METABOLIC PANEL
ALT: 15 U/L (ref 0–44)
AST: 14 U/L — ABNORMAL LOW (ref 15–41)
Albumin: 3.3 g/dL — ABNORMAL LOW (ref 3.5–5.0)
Alkaline Phosphatase: 60 U/L (ref 38–126)
Anion gap: 9 (ref 5–15)
BUN: 12 mg/dL (ref 6–20)
CO2: 26 mmol/L (ref 22–32)
Calcium: 8.5 mg/dL — ABNORMAL LOW (ref 8.9–10.3)
Chloride: 101 mmol/L (ref 98–111)
Creatinine, Ser: 0.74 mg/dL (ref 0.61–1.24)
GFR calc Af Amer: >60 mL/min (ref >60)
GFR calc non Af Amer: >60 mL/min (ref >60)
Glucose, Bld: 106 mg/dL — ABNORMAL HIGH (ref 70–99)
Potassium: 4.1 mmol/L (ref 3.5–5.1)
Sodium: 136 mmol/L (ref 135–145)
Total Bilirubin: 0.4 mg/dL (ref 0.3–1.2)
Total Protein: 8.2 g/dL — ABNORMAL HIGH (ref 6.5–8.1)

## 2020-07-24 LAB — C-REACTIVE PROTEIN: CRP: 5.5 mg/dL — ABNORMAL HIGH (ref <1.0)

## 2020-07-24 MED ORDER — TRAMADOL HCL 50 MG PO TABS: 50.0000 mg | ORAL_TABLET | Freq: Two times a day (BID) | ORAL | 0 refills | Status: DC | PRN

## 2020-07-24 MED ORDER — SULFAMETHOXAZOLE-TRIMETHOPRIM 800-160 MG PO TABS: 1.0000 | ORAL_TABLET | Freq: Two times a day (BID) | ORAL | 0 refills | Status: AC

## 2020-07-24 NOTE — Discharge Summary (Signed)
Physician Discharge Summary  Todd HowellsDevon Jermaine Jackpotreasey RUE:[id number] DOB: [birth year] DOA: 07/21/2020  PCP: Patient, No Pcp Per  Admit date: 07/21/2020 Discharge date: 07/24/2020  Admitted From: Home Disposition: Home  Recommendations for Outpatient Follow-up:  1. Follow up with PCP in 1-2 weeks 2. Please obtain BMP/CBC in one week your next doctors visit.  3. Bactrim orally twice daily for 7 days 4. Tramadol as needed for pain   Discharge Condition: Stable CODE STATUS: Full code Diet recommendation: 2 g salt diet  Brief/Interim Summary: 39 year old male with past medical history of HIV and chronic lymphedema presents to the hospital with complaints of right lower extremity pain.  Apparently patient was diagnosed with cellulitis and was placed on 10 days of Keflex which he failed therefore presented to the hospital.  In the hospital he was started on IV vancomycin and then transition to IV cefazolin.  After receiving about 48 hours of antibiotics he started feeling much better with improvement in his pain, swelling and erythema.  He was transitioned to p.o. Bactrim for 7 more days and discharged in stable condition.   Body mass index is 50.9 kg/m.     Right lower extremity cellulitis Serosanguineous drainage, no pus. History of recurrent cellulitis. Chronic lymphedema -Failed outpatient and a treatment with oral Keflex, responded well to IV vancomycin and then transition to IV cefazolin in the hospital.  At the time of discharge we will transition him to oral Bactrim for 7 more days.  And follow-up outpatient with PCP.  History of HIV -Continue outpatient regimen  Morbid obesity with BMI greater than 50 Body mass index is 50.9 kg/m.  Will benefit from weight loss and lifestyle modification outpatient. Follow-up with PCP.  Anemia of Chronic disease No active bleeding. H&H stable.  Monitor.     Discharge Diagnoses:  Principal Problem:   Cellulitis Active Problems:    Human immunodeficiency virus (HIV) disease (HCC)   Morbid obesity (HCC)   Neuropathy   Benign essential HTN   Chronic acquired lymphedema   Recurrent cellulitis of lower extremity   Cellulitis, leg      Consultations:  None  Subjective: Feels great this morning wants to go home.  Discharge Exam: Vitals:   07/23/20 2037 07/24/20 0524  BP: 122/65 133/74  Pulse: 88 79  Resp: 19 19  Temp: 97.9 F (36.6 C) 97.6 F (36.4 C)  SpO2: 99% 96%   Vitals:   07/23/20 0439 07/23/20 1410 07/23/20 2037 07/24/20 0524  BP: 109/70 130/76 122/65 133/74  Pulse: 74 78 88 79  Resp: 19 20 19 19   Temp: 98.1 F (36.7 C) 98.4 F (36.9 C) 97.9 F (36.6 C) 97.6 F (36.4 C)  TempSrc:  Oral Oral Oral  SpO2: 98% 99% 99% 96%  Weight:      Height:        General: Pt is alert, awake, not in acute distress Cardiovascular: RRR, S1/S2 +, no rubs, no gallops Respiratory: CTA bilaterally, no wheezing, no rhonchi Abdominal: Soft, NT, ND, bowel sounds + Extremities: Bilateral lower extremity lymphedema noted, right lower extremity erythema appears to be much better with minimal drainage.  Discharge Instructions   Allergies as of 07/24/2020      Reactions   Morphine And Related Nausea And Vomiting, Other (See Comments)   Elevated blood pressure   Pork-derived Products Other (See Comments)   No pork for religious reasons   Shellfish Allergy Other (See Comments)   No shrimp for religious reasons      Medication  List    STOP taking these medications   orlistat 120 MG capsule Commonly known as: XENICAL     TAKE these medications   acetaminophen 500 MG tablet Commonly known as: TYLENOL Take 1,000 mg by mouth every 6 (six) hours as needed for headache (pain).   Biktarvy 50-200-25 MG Tabs tablet Generic drug: bictegravir-emtricitabine-tenofovir AF Take 1 tablet by mouth daily. What changed: when to take this   ibuprofen 200 MG tablet Commonly known as: ADVIL Take 800 mg by mouth every  6 (six) hours as needed (pain/headache).   mupirocin ointment 2 % Commonly known as: BACTROBAN Apply 1 application topically 3 (three) times daily. What changed:   when to take this  reasons to take this   ProAir HFA 108 (90 Base) MCG/ACT inhaler Generic drug: albuterol Inhale 2 puffs into the lungs every 6 (six) hours as needed for wheezing or shortness of breath.   sulfamethoxazole-trimethoprim 800-160 MG tablet Commonly known as: BACTRIM DS Take 1 tablet by mouth 2 (two) times daily for 7 days.   traMADol 50 MG tablet Commonly known as: Ultram Take 1 tablet (50 mg total) by mouth every 12 (twelve) hours as needed for severe pain.   VITAMIN B12 PO Take 2 tablets by mouth daily.       Follow-up Information    [facility]. Schedule an appointment as soon as possible for a visit in 1 week(s).   Contact information: [address] [place] [address] [phone]             Allergies  Allergen Reactions  . Morphine And Related Nausea And Vomiting and Other (See Comments)    Elevated blood pressure   . Pork-Derived Products Other (See Comments)    No pork for religious reasons  . Shellfish Allergy Other (See Comments)    No shrimp for religious reasons    You were cared for by a hospitalist during your hospital stay. If you have any questions about your discharge medications or the care you received while you were in the hospital after you are discharged, you can call the unit and asked to speak with the hospitalist on call if the hospitalist that took care of you is not available. Once you are discharged, your primary care physician will handle any further medical issues. Please note that no refills for any discharge medications will be authorized once you are discharged, as it is imperative that you return to your primary care physician (or establish a relationship with a primary care physician if you do not have  one) for your aftercare needs so that they can reassess your need for medications and monitor your lab values.   Procedures/Studies: DG Tibia/Fibula Right  Result Date: 07/21/2020 CLINICAL DATA:  Right leg cellulitis. EXAM: RIGHT TIBIA AND FIBULA - 2 VIEW COMPARISON:  12/23/2018 FINDINGS: No acute fracture. No bone lesion and no bone resorption to suggest osteomyelitis. Previous proximal tibia fracture reduced with a lateral fixation plate and multiple screws stable compared to the prior exam. Knee and ankle joints are normally aligned. There is diffuse subcutaneous soft tissue edema/cellulitis. No soft tissue air. IMPRESSION: 1. No fracture.  No evidence of osteomyelitis. 2. Soft tissue swelling/cellulitis.  No soft tissue air. Electronically Signed   By: Amie Portland M.D.   On: 07/21/2020 09:33      The results of significant diagnostics from this hospitalization (including imaging, microbiology, ancillary and laboratory) are listed below for reference.  Microbiology: Recent Results (from the past 240 hour(s))  SARS Coronavirus 2 by RT PCR (hospital order, performed in [facility] hospital lab) Nasopharyngeal Nasopharyngeal Swab     Status: None   Collection Time: 07/21/20  6:19 AM   Specimen: Nasopharyngeal Swab  Result Value Ref Range Status   SARS Coronavirus 2 NEGATIVE NEGATIVE Final    Comment: (NOTE) SARS-CoV-2 target nucleic acids are NOT DETECTED.  The SARS-CoV-2 RNA is generally detectable in upper and lower respiratory specimens during the acute phase of infection. The lowest concentration of SARS-CoV-2 viral copies this assay can detect is 250 copies / mL. A negative result does not preclude SARS-CoV-2 infection and should not be used as the sole basis for treatment or other patient management decisions.  A negative result may occur with improper specimen collection / handling, submission of specimen other than nasopharyngeal swab, presence of viral mutation(s) within  the areas targeted by this assay, and inadequate number of viral copies (<250 copies / mL). A negative result must be combined with clinical observations, patient history, and epidemiological information.  Fact Sheet for Patients:   BoilerBrush.com.cy  Fact Sheet for Healthcare Providers: https://pope.com/  This test is not yet approved or  cleared by the [place] FDA and has been authorized for detection and/or diagnosis of SARS-CoV-2 by FDA under an Emergency Use Authorization (EUA).  This EUA will remain in effect (meaning this test can be used) for the duration of the COVID-19 declaration under Section 564(b)(1) of the Act, 21 U.S.C. section 360bbb-3(b)(1), unless the authorization is terminated or revoked sooner.  Performed at [facility], [address], [place], [place] [address]   MRSA PCR Screening     Status: None   Collection Time: 07/22/20  1:14 PM   Specimen: Nasopharyngeal  Result Value Ref Range Status   MRSA by PCR NEGATIVE NEGATIVE Final    Comment:        The GeneXpert MRSA Assay (FDA approved for NASAL specimens only), is one component of a comprehensive MRSA colonization surveillance program. It is not intended to diagnose MRSA infection nor to guide or monitor treatment for MRSA infections. Performed at [facility], [address] [place], [place] [address]      Labs: BNP (last 3 results) No results for input(s): BNP in the last 8760 hours. Basic Metabolic Panel: Recent Labs  Lab 07/21/20 0041 07/22/20 0438 07/24/20 0901  NA 138 137 136  K 4.9 3.9 4.1  CL 104 104 101  CO2 22 24 26   GLUCOSE 104* 108* 106*  BUN 16 15 12   CREATININE 0.88 0.86 0.74  CALCIUM 8.8* 8.6* 8.5*   Liver Function Tests: Recent Labs  Lab 07/21/20 0041 07/22/20 0438 07/24/20 0901  AST 22 11* 14*  ALT 13 10 15   ALKPHOS 66 54 60  BILITOT 1.2 0.6 0.4  PROT 9.2* 8.1 8.2*   ALBUMIN 3.6 3.0* 3.3*   No results for input(s): LIPASE, AMYLASE in the last 168 hours. No results for input(s): AMMONIA in the last 168 hours. CBC: Recent Labs  Lab 07/21/20 0610 07/22/20 0438 07/24/20 0901  WBC 10.4 9.0 8.7  NEUTROABS 6.9  --  5.5  HGB 10.1* 9.9* 10.1*  HCT 34.1* 32.3* 34.0*  MCV 81.4 80.0 80.8  PLT 337 309 355   Cardiac Enzymes: No results for input(s): CKTOTAL, CKMB, CKMBINDEX, TROPONINI in the last 168 hours. BNP: Invalid input(s): POCBNP CBG: No results for input(s): GLUCAP in the last 168 hours.  D-Dimer No results for input(s): DDIMER in the last 72 hours. Hgb A1c No results for input(s): HGBA1C in the last 72 hours. Lipid Profile No results for input(s): CHOL, HDL, LDLCALC, TRIG, CHOLHDL, LDLDIRECT in the last 72 hours. Thyroid function studies No results for input(s): TSH, T4TOTAL, T3FREE, THYROIDAB in the last 72 hours.  Invalid input(s): FREET3 Anemia work up No results for input(s): VITAMINB12, FOLATE, FERRITIN, TIBC, IRON, RETICCTPCT in the last 72 hours. Urinalysis    Component Value Date/Time   COLORURINE YELLOW 02/18/2016 0904   APPEARANCEUR CLOUDY (A) 02/18/2016 0904   LABSPEC 1.011 02/18/2016 0904   PHURINE 8.5 (H) 02/18/2016 0904   GLUCOSEU NEGATIVE 02/18/2016 0904   HGBUR MODERATE (A) 02/18/2016 0904   BILIRUBINUR NEGATIVE 02/18/2016 0904   KETONESUR NEGATIVE 02/18/2016 0904   PROTEINUR NEGATIVE 02/18/2016 0904   NITRITE NEGATIVE 02/18/2016 0904   LEUKOCYTESUR MODERATE (A) 02/18/2016 0904   Sepsis Labs Invalid input(s): PROCALCITONIN,  WBC,  LACTICIDVEN Microbiology Recent Results (from the past 240 hour(s))  SARS Coronavirus 2 by RT PCR (hospital order, performed in [facility] hospital lab) Nasopharyngeal Nasopharyngeal Swab     Status: None   Collection Time: 07/21/20  6:19 AM   Specimen: Nasopharyngeal Swab  Result Value Ref Range Status   SARS Coronavirus 2 NEGATIVE NEGATIVE Final    Comment: (NOTE) SARS-CoV-2  target nucleic acids are NOT DETECTED.  The SARS-CoV-2 RNA is generally detectable in upper and lower respiratory specimens during the acute phase of infection. The lowest concentration of SARS-CoV-2 viral copies this assay can detect is 250 copies / mL. A negative result does not preclude SARS-CoV-2 infection and should not be used as the sole basis for treatment or other patient management decisions.  A negative result may occur with improper specimen collection / handling, submission of specimen other than nasopharyngeal swab, presence of viral mutation(s) within the areas targeted by this assay, and inadequate number of viral copies (<250 copies / mL). A negative result must be combined with clinical observations, patient history, and epidemiological information.  Fact Sheet for Patients:   BoilerBrush.com.cy  Fact Sheet for Healthcare Providers: https://pope.com/  This test is not yet approved or  cleared by the [place] FDA and has been authorized for detection and/or diagnosis of SARS-CoV-2 by FDA under an Emergency Use Authorization (EUA).  This EUA will remain in effect (meaning this test can be used) for the duration of the COVID-19 declaration under Section 564(b)(1) of the Act, 21 U.S.C. section 360bbb-3(b)(1), unless the authorization is terminated or revoked sooner.  Performed at [facility], [address] [place], [place] [address]   MRSA PCR Screening     Status: None   Collection Time: 07/22/20  1:14 PM   Specimen: Nasopharyngeal  Result Value Ref Range Status   MRSA by PCR NEGATIVE NEGATIVE Final    Comment:        The GeneXpert MRSA Assay (FDA approved for NASAL specimens only), is one component of a comprehensive MRSA colonization surveillance program. It is not intended to diagnose MRSA infection nor to guide or monitor treatment for MRSA infections. Performed at [facility], [address] [place], [place] [address]      Time coordinating discharge:  I have spent 35 minutes face to face with the patient and on the ward discussing the patients care, assessment, plan and disposition with other care givers. >50% of the time was devoted counseling the patient about the risks and benefits  of treatment/Discharge disposition and coordinating care.   SIGNED:   Dimple Nanas, MD  [facility] 07/24/2020, 12:42 PM   If 7PM-7AM, please contact night-coverage

## 2020-07-24 NOTE — Discharge Instructions (Signed)

## 2020-07-24 NOTE — Progress Notes (Signed)
Pt VS WNL.  Discharge instructions provided to and reviewed with patient.  PIV discontinued.  Catheter intact and clotting within expected timeframe.  Pt transported to front lobby via wheelchair by [employer].

## 2020-07-24 NOTE — Plan of Care (Signed)

## 2020-08-05 MED FILL — BIKTARVY 50-200-25 MG TABS: 50-200-25 | 90 days supply | Qty: 90 | Fill #1

## 2020-08-21 DIAGNOSIS — Z419 Encounter for procedure for purposes other than remedying health state, unspecified: Secondary | ICD-10-CM | POA: Diagnosis not present

## 2020-09-20 DIAGNOSIS — Z419 Encounter for procedure for purposes other than remedying health state, unspecified: Secondary | ICD-10-CM | POA: Diagnosis not present

## 2020-10-18 MED FILL — BIKTARVY 50-200-25 MG TABS: 50-200-25 | 90 days supply | Qty: 90 | Fill #1

## 2020-10-21 DIAGNOSIS — Z419 Encounter for procedure for purposes other than remedying health state, unspecified: Secondary | ICD-10-CM | POA: Diagnosis not present

## 2020-11-20 DIAGNOSIS — Z419 Encounter for procedure for purposes other than remedying health state, unspecified: Secondary | ICD-10-CM | POA: Diagnosis not present

## 2020-12-21 DIAGNOSIS — Z419 Encounter for procedure for purposes other than remedying health state, unspecified: Secondary | ICD-10-CM | POA: Diagnosis not present

## 2021-01-21 DIAGNOSIS — Z419 Encounter for procedure for purposes other than remedying health state, unspecified: Secondary | ICD-10-CM | POA: Diagnosis not present

## 2021-01-23 MED FILL — BIKTARVY 50-200-25 MG TABS: 50-200-25 | 90 days supply | Qty: 90 | Fill #2

## 2021-02-05 MED FILL — BIKTARVY 50-200-25 MG TABS: 50-200-25 | 90 days supply | Qty: 90 | Fill #2

## 2021-02-18 DIAGNOSIS — Z419 Encounter for procedure for purposes other than remedying health state, unspecified: Secondary | ICD-10-CM | POA: Diagnosis not present

## 2021-02-26 DIAGNOSIS — L03115 Cellulitis of right lower limb: Secondary | ICD-10-CM | POA: Diagnosis not present

## 2021-03-14 ENCOUNTER — Other Ambulatory Visit (HOSPITAL_COMMUNITY): Payer: Self-pay

## 2021-03-21 DIAGNOSIS — Z419 Encounter for procedure for purposes other than remedying health state, unspecified: Secondary | ICD-10-CM | POA: Diagnosis not present

## 2021-04-08 ENCOUNTER — Other Ambulatory Visit: Payer: Self-pay

## 2021-04-08 ENCOUNTER — Encounter (HOSPITAL_COMMUNITY): Payer: Self-pay

## 2021-04-08 ENCOUNTER — Inpatient Hospital Stay (HOSPITAL_COMMUNITY)
Admission: EM | Admit: 2021-04-08 | Discharge: 2021-04-10 | DRG: 603 | Disposition: A | Discharge status: Home / Self Care | Payer: Medicaid Other | Attending: Internal Medicine | Admitting: Internal Medicine

## 2021-04-08 DIAGNOSIS — E119 Type 2 diabetes mellitus without complications: Secondary | ICD-10-CM | POA: Diagnosis not present

## 2021-04-08 DIAGNOSIS — L0390 Cellulitis, unspecified: Secondary | ICD-10-CM | POA: Diagnosis present

## 2021-04-08 DIAGNOSIS — E876 Hypokalemia: Secondary | ICD-10-CM | POA: Diagnosis not present

## 2021-04-08 DIAGNOSIS — I10 Essential (primary) hypertension: Secondary | ICD-10-CM | POA: Diagnosis not present

## 2021-04-08 DIAGNOSIS — M79604 Pain in right leg: Secondary | ICD-10-CM | POA: Diagnosis not present

## 2021-04-08 DIAGNOSIS — L03115 Cellulitis of right lower limb: Principal | ICD-10-CM

## 2021-04-08 DIAGNOSIS — Z86718 Personal history of other venous thrombosis and embolism: Secondary | ICD-10-CM | POA: Diagnosis not present

## 2021-04-08 DIAGNOSIS — Z6841 Body Mass Index (BMI) 40.0 and over, adult: Secondary | ICD-10-CM | POA: Diagnosis not present

## 2021-04-08 DIAGNOSIS — Z86711 Personal history of pulmonary embolism: Secondary | ICD-10-CM | POA: Diagnosis not present

## 2021-04-08 DIAGNOSIS — R509 Fever, unspecified: Secondary | ICD-10-CM | POA: Diagnosis not present

## 2021-04-08 DIAGNOSIS — Z91013 Allergy to seafood: Secondary | ICD-10-CM

## 2021-04-08 DIAGNOSIS — R600 Localized edema: Secondary | ICD-10-CM | POA: Diagnosis present

## 2021-04-08 DIAGNOSIS — Z79899 Other long term (current) drug therapy: Secondary | ICD-10-CM | POA: Diagnosis not present

## 2021-04-08 DIAGNOSIS — Z21 Asymptomatic human immunodeficiency virus [HIV] infection status: Secondary | ICD-10-CM | POA: Diagnosis not present

## 2021-04-08 DIAGNOSIS — Z20822 Contact with and (suspected) exposure to covid-19: Secondary | ICD-10-CM | POA: Diagnosis not present

## 2021-04-08 DIAGNOSIS — Z885 Allergy status to narcotic agent status: Secondary | ICD-10-CM

## 2021-04-08 DIAGNOSIS — K219 Gastro-esophageal reflux disease without esophagitis: Secondary | ICD-10-CM | POA: Diagnosis not present

## 2021-04-08 DIAGNOSIS — I890 Lymphedema, not elsewhere classified: Secondary | ICD-10-CM | POA: Diagnosis not present

## 2021-04-08 DIAGNOSIS — Z91018 Allergy to other foods: Secondary | ICD-10-CM | POA: Diagnosis not present

## 2021-04-08 DIAGNOSIS — I959 Hypotension, unspecified: Secondary | ICD-10-CM | POA: Diagnosis not present

## 2021-04-08 LAB — COMPREHENSIVE METABOLIC PANEL
ALT: 11 U/L (ref 0–44)
AST: 13 U/L — ABNORMAL LOW (ref 15–41)
Albumin: 2.9 g/dL — ABNORMAL LOW (ref 3.5–5.0)
Alkaline Phosphatase: 56 U/L (ref 38–126)
Anion gap: 7 (ref 5–15)
BUN: 11 mg/dL (ref 6–20)
CO2: 24 mmol/L (ref 22–32)
Calcium: 8.4 mg/dL — ABNORMAL LOW (ref 8.9–10.3)
Chloride: 104 mmol/L (ref 98–111)
Creatinine, Ser: 1.01 mg/dL (ref 0.61–1.24)
GFR, Estimated: >60 mL/min (ref >60)
Glucose, Bld: 135 mg/dL — ABNORMAL HIGH (ref 70–99)
Potassium: 3.1 mmol/L — ABNORMAL LOW (ref 3.5–5.1)
Sodium: 135 mmol/L (ref 135–145)
Total Bilirubin: 0.6 mg/dL (ref 0.3–1.2)
Total Protein: 7.9 g/dL (ref 6.5–8.1)

## 2021-04-08 LAB — CBC WITH DIFFERENTIAL/PLATELET
Abs Immature Granulocytes: 0.18 10*3/uL — ABNORMAL HIGH (ref 0.00–0.07)
Basophils Absolute: 0 10*3/uL (ref 0.0–0.1)
Basophils Relative: 0 %
Eosinophils Absolute: 0.1 10*3/uL (ref 0.0–0.5)
Eosinophils Relative: 0 %
HCT: 29.9 % — ABNORMAL LOW (ref 39.0–52.0)
Hemoglobin: 9.2 g/dL — ABNORMAL LOW (ref 13.0–17.0)
Immature Granulocytes: 1 %
Lymphocytes Relative: 8 %
Lymphs Abs: 1.5 10*3/uL (ref 0.7–4.0)
MCH: 24.5 pg — ABNORMAL LOW (ref 26.0–34.0)
MCHC: 30.8 g/dL (ref 30.0–36.0)
MCV: 79.7 fL — ABNORMAL LOW (ref 80.0–100.0)
Monocytes Absolute: 0.9 10*3/uL (ref 0.1–1.0)
Monocytes Relative: 5 %
Neutro Abs: 16.1 10*3/uL — ABNORMAL HIGH (ref 1.7–7.7)
Neutrophils Relative %: 86 %
Platelets: 256 10*3/uL (ref 150–400)
RBC: 3.75 MIL/uL — ABNORMAL LOW (ref 4.22–5.81)
RDW: 14.7 % (ref 11.5–15.5)
WBC: 18.9 10*3/uL — ABNORMAL HIGH (ref 4.0–10.5)
nRBC: 0 % (ref 0.0–0.2)

## 2021-04-08 LAB — MAGNESIUM: Magnesium: 1.7 mg/dL (ref 1.7–2.4)

## 2021-04-08 LAB — IRON AND TIBC
Iron: 17 ug/dL — ABNORMAL LOW (ref 45–182)
Saturation Ratios: 8 % — ABNORMAL LOW (ref 17.9–39.5)
TIBC: 219 ug/dL — ABNORMAL LOW (ref 250–450)
UIBC: 202 ug/dL

## 2021-04-08 LAB — SARS CORONAVIRUS 2 (TAT 6-24 HRS): SARS Coronavirus 2: NEGATIVE

## 2021-04-08 MED ORDER — VANCOMYCIN HCL 10 G IV SOLR
2500.0000 mg | Freq: Once | INTRAVENOUS | Status: AC
  Administered 2021-04-08: 2500 mg via INTRAVENOUS
  Filled 2021-04-08: qty 2500

## 2021-04-08 MED ORDER — SODIUM CHLORIDE 0.9 % IV SOLN
2.0000 g | INTRAVENOUS | Status: DC
  Administered 2021-04-08 – 2021-04-09 (×2): 2 g via INTRAVENOUS
  Filled 2021-04-08: qty 20
  Filled 2021-04-08: qty 2

## 2021-04-08 MED ORDER — BICTEGRAVIR-EMTRICITAB-TENOFOV 50-200-25 MG PO TABS
1.0000 | ORAL_TABLET | Freq: Every day | ORAL | Status: DC
  Administered 2021-04-08 – 2021-04-09 (×2): 1 via ORAL
  Filled 2021-04-08 (×2): qty 1

## 2021-04-08 MED ORDER — POTASSIUM CHLORIDE CRYS ER 20 MEQ PO TBCR
40.0000 meq | EXTENDED_RELEASE_TABLET | Freq: Two times a day (BID) | ORAL | Status: AC
  Administered 2021-04-08 – 2021-04-09 (×2): 40 meq via ORAL
  Filled 2021-04-08 (×2): qty 2

## 2021-04-08 MED ORDER — SENNOSIDES-DOCUSATE SODIUM 8.6-50 MG PO TABS: 1.0000 | ORAL_TABLET | Freq: Every evening | ORAL | Status: DC | PRN

## 2021-04-08 MED ORDER — FENTANYL CITRATE (PF) 100 MCG/2ML IJ SOLN: 25.0000 ug | Freq: Once | INTRAMUSCULAR | Status: DC

## 2021-04-08 MED ORDER — ENOXAPARIN SODIUM 40 MG/0.4ML ~~LOC~~ SOLN: 40.0000 mg | SUBCUTANEOUS | Status: DC

## 2021-04-08 MED ORDER — KETOROLAC TROMETHAMINE 15 MG/ML IJ SOLN: 15.0000 mg | Freq: Once | INTRAMUSCULAR | Status: DC

## 2021-04-08 MED ORDER — POTASSIUM CHLORIDE CRYS ER 20 MEQ PO TBCR
40.0000 meq | EXTENDED_RELEASE_TABLET | Freq: Once | ORAL | Status: AC
  Administered 2021-04-08: 40 meq via ORAL
  Filled 2021-04-08: qty 2

## 2021-04-08 MED ORDER — FONDAPARINUX SODIUM 2.5 MG/0.5ML ~~LOC~~ SOLN
2.5000 mg | SUBCUTANEOUS | Status: DC
  Administered 2021-04-08 – 2021-04-09 (×2): 2.5 mg via SUBCUTANEOUS
  Filled 2021-04-08 (×2): qty 0.5

## 2021-04-08 MED ORDER — ACETAMINOPHEN 325 MG PO TABS
650.0000 mg | ORAL_TABLET | Freq: Once | ORAL | Status: AC
  Administered 2021-04-08: 650 mg via ORAL
  Filled 2021-04-08: qty 2

## 2021-04-08 MED ORDER — ACETAMINOPHEN 650 MG RE SUPP: 650.0000 mg | Freq: Four times a day (QID) | RECTAL | Status: DC | PRN

## 2021-04-08 MED ORDER — ONDANSETRON HCL 4 MG/2ML IJ SOLN: 4.0000 mg | Freq: Four times a day (QID) | INTRAMUSCULAR | Status: DC | PRN

## 2021-04-08 MED ORDER — VANCOMYCIN HCL 1500 MG/300ML IV SOLN
1500.0000 mg | Freq: Two times a day (BID) | INTRAVENOUS | Status: DC
  Administered 2021-04-08 – 2021-04-09 (×2): 1500 mg via INTRAVENOUS
  Filled 2021-04-08 (×2): qty 300

## 2021-04-08 MED ORDER — ONDANSETRON HCL 4 MG PO TABS: 4.0000 mg | ORAL_TABLET | Freq: Four times a day (QID) | ORAL | Status: DC | PRN

## 2021-04-08 MED ORDER — SODIUM CHLORIDE 0.9 % IV SOLN
100.0000 mg | Freq: Once | INTRAVENOUS | Status: AC
  Administered 2021-04-08: 100 mg via INTRAVENOUS
  Filled 2021-04-08: qty 100

## 2021-04-08 MED ORDER — OXYCODONE-ACETAMINOPHEN 7.5-325 MG PO TABS
1.0000 | ORAL_TABLET | Freq: Four times a day (QID) | ORAL | Status: DC | PRN
  Administered 2021-04-08 – 2021-04-09 (×2): 1 via ORAL
  Filled 2021-04-08 (×2): qty 1

## 2021-04-08 MED ORDER — ACETAMINOPHEN 325 MG PO TABS: 650.0000 mg | ORAL_TABLET | Freq: Four times a day (QID) | ORAL | Status: DC | PRN
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
## 2021-04-08 NOTE — ED Triage Notes (Signed)
Pt with hx of cellulitis of right leg.  Pt reports that the right leg is always more swollen than the left, however over past 2 days the right leg has became more swollen than usual, much more red, and tender/warm to touch.  Afebrile per ems.  Pt took ibuprofen earlier in the evening.
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
## 2021-04-08 NOTE — Progress Notes (Signed)
Pharmacy Antibiotic Note  Todd Parrish is a 40 y.o. male admitted on 04/08/2021 with RLE cellulitis.  Pharmacy has been consulted for vancomycin dosing.  Plan: Vancomycin 2500 mg IV loading dose followed by vancomycin 1500 mg IV q12; est AUC 514.7 using SCr 1.01, Vd 0.5, ht 67 inches; Css max/min 33.2/14.5 F/u renal function, WBC, temp, culture data Vancomycin levels as needed  Weight: (!) 147.4 kg (324 lb 15.3 oz)  Temp (24hrs), Avg:99 F (37.2 C), Min:99 F (37.2 C), Max:99 F (37.2 C)  Recent Labs  Lab 04/08/21 0500  WBC 18.9*  CREATININE 1.01    CrCl cannot be calculated (Unknown ideal weight.).    Allergies  Allergen Reactions  . Morphine And Related Nausea And Vomiting and Other (See Comments)    Elevated blood pressure   . Pork-Derived Products Other (See Comments)    No pork for religious reasons - doesn't eat pork No pork for religious reasons  . Shellfish Allergy Other (See Comments)    No shrimp for religious reasons -doesn't eat shrimp religious puroses No shrimp for religious reasons    Antimicrobials this admission: 4/19 doxy x 1 4/19 vanc>> Dose adjustments this admission:  Microbiology results:   Thank you for allowing pharmacy to be a part of this patient's care.  Herby Abraham, Pharm.D 04/08/2021 8:14 AM

## 2021-04-08 NOTE — Consult Note (Addendum)
WOC Nurse wound consult note Consultation was completed by review of records, images and assistance from the bedside nurse/clinical staff.   Reason for Consult: old surgical site Wound type: weeping open area Pressure Injury POA: NA  Drainage (amount, consistency, odor) dried yellow drainage on the bed linens per the bedside nurse. Not actively draining.   In the presence of severe lymphedema would not be surprized to have areas of spontaneous weeping Dressing procedure/placement/frequency: Silicone foam as needed to protect any areas of weeping noted per the skin care order set.  Added lymphedema resources to physician sticky notes.   Discussed POC with patient and bedside nurse.  Re consult if needed, will not follow at this time. Thanks  Aadit Hagood [employer], RN,CWOCN, CNS, CWON-AP [phone])

## 2021-04-08 NOTE — Plan of Care (Signed)

## 2021-04-09 DIAGNOSIS — I890 Lymphedema, not elsewhere classified: Secondary | ICD-10-CM

## 2021-04-09 DIAGNOSIS — L03115 Cellulitis of right lower limb: Secondary | ICD-10-CM | POA: Diagnosis not present

## 2021-04-09 LAB — COMPREHENSIVE METABOLIC PANEL
ALT: 11 U/L (ref 0–44)
AST: 18 U/L (ref 15–41)
Albumin: 2.6 g/dL — ABNORMAL LOW (ref 3.5–5.0)
Alkaline Phosphatase: 60 U/L (ref 38–126)
Anion gap: 9 (ref 5–15)
BUN: 8 mg/dL (ref 6–20)
CO2: 24 mmol/L (ref 22–32)
Calcium: 8.3 mg/dL — ABNORMAL LOW (ref 8.9–10.3)
Chloride: 105 mmol/L (ref 98–111)
Creatinine, Ser: 1.05 mg/dL (ref 0.61–1.24)
GFR, Estimated: >60 mL/min (ref >60)
Glucose, Bld: 134 mg/dL — ABNORMAL HIGH (ref 70–99)
Potassium: 3.8 mmol/L (ref 3.5–5.1)
Sodium: 138 mmol/L (ref 135–145)
Total Bilirubin: 0.7 mg/dL (ref 0.3–1.2)
Total Protein: 7.9 g/dL (ref 6.5–8.1)

## 2021-04-09 LAB — BASIC METABOLIC PANEL
Anion gap: 7 (ref 5–15)
BUN: 8 mg/dL (ref 6–20)
CO2: 25 mmol/L (ref 22–32)
Calcium: 8.2 mg/dL — ABNORMAL LOW (ref 8.9–10.3)
Chloride: 103 mmol/L (ref 98–111)
Creatinine, Ser: 0.94 mg/dL (ref 0.61–1.24)
GFR, Estimated: >60 mL/min (ref >60)
Glucose, Bld: 118 mg/dL — ABNORMAL HIGH (ref 70–99)
Potassium: 3.3 mmol/L — ABNORMAL LOW (ref 3.5–5.1)
Sodium: 135 mmol/L (ref 135–145)

## 2021-04-09 LAB — CBC
HCT: 28.2 % — ABNORMAL LOW (ref 39.0–52.0)
Hemoglobin: 8.6 g/dL — ABNORMAL LOW (ref 13.0–17.0)
MCH: 24.4 pg — ABNORMAL LOW (ref 26.0–34.0)
MCHC: 30.5 g/dL (ref 30.0–36.0)
MCV: 80.1 fL (ref 80.0–100.0)
Platelets: 283 10*3/uL (ref 150–400)
RBC: 3.52 MIL/uL — ABNORMAL LOW (ref 4.22–5.81)
RDW: 15.1 % (ref 11.5–15.5)
WBC: 15.7 10*3/uL — ABNORMAL HIGH (ref 4.0–10.5)
nRBC: 0 % (ref 0.0–0.2)

## 2021-04-09 MED ORDER — CEFAZOLIN SODIUM-DEXTROSE 2-4 GM/100ML-% IV SOLN
2.0000 g | Freq: Three times a day (TID) | INTRAVENOUS | Status: DC
  Administered 2021-04-10: 2 g via INTRAVENOUS
  Filled 2021-04-09 (×2): qty 100

## 2021-04-09 NOTE — Progress Notes (Signed)
PROGRESS NOTE  Todd Parrish WCB:[id number] DOB: 04-17-1981 DOA: 04/08/2021 PCP: Patient, No Pcp Per (Inactive)   LOS: 1 day   Brief narrative:  Todd Parrish is a 40 y.o. male with medical history significant of HIV, chronic lymphedema presented to hospital with right thigh redness swelling and cellulitis.  He was seen in urgent care and was given Bactrim but despite that he continued had symptoms with redness fever and tenderness.  In the ED, he was noted to have elevated white count.  Patient was then admitted to hospital for failed outpatient treatment of cellulitis.   Assessment/Plan:  Active Problems:   Cellulitis  Right thigh cellulitis on the background of bilateral lymphedema.    Failed outpatient treatment with Bactrim.  Currently on vancomycin and Rocephin.  WBC of 15.7.  No fever.  Still has erythema tenderness over the right thigh.  Continue IV antibiotics for now.  We will continue to monitor clinical progress.  Blood cultures negative in less than 24 hours.  Morbid obesity    Will benefit from weight loss as outpatient.  HIV As per the patient, viral load undetectable.  Continue continue home regimen of Biktarvy.   Hypokalemia Improved after replacement.  Check levels in a.m.  DVT prophylaxis: fondaparinux (ARIXTRA) injection 2.5 mg Start: 04/08/21 2200  Code Status: Full code  Family Communication: None  Status is: Inpatient  Remains inpatient appropriate because:IV treatments appropriate due to intensity of illness or inability to take PO and Inpatient level of care appropriate due to severity of illness   Dispo: The patient is from: Home              Anticipated d/c is to: Home              Patient currently is not medically stable to d/c.   Difficult to place patient No   Consultants:  None  Procedures:  None  Anti-infectives:  Susanne Borders . Vancomycin . Rocephin  Anti-infectives (From admission, onward)   Start      Dose/Rate Route Frequency Ordered Stop   04/08/21 2200  bictegravir-emtricitabine-tenofovir AF (BIKTARVY) 50-200-25 MG per tablet 1 tablet        1 tablet Oral Daily at bedtime 04/08/21 0820     04/08/21 2200  vancomycin (VANCOREADY) IVPB 1500 mg/300 mL        1,500 mg 150 mL/hr over 120 Minutes Intravenous Every 12 hours 04/08/21 1124     04/08/21 0830  cefTRIAXone (ROCEPHIN) 2 g in sodium chloride 0.9 % 100 mL IVPB        2 g 200 mL/hr over 30 Minutes Intravenous Every 24 hours 04/08/21 0820     04/08/21 0815  vancomycin (VANCOCIN) 2,500 mg in sodium chloride 0.9 % 500 mL IVPB        2,500 mg 250 mL/hr over 120 Minutes Intravenous  Once 04/08/21 0802 04/08/21 1124   04/08/21 0430  doxycycline (VIBRAMYCIN) 100 mg in sodium chloride 0.9 % 250 mL IVPB        100 mg 125 mL/hr over 120 Minutes Intravenous  Once 04/08/21 0422 04/08/21 0710       Subjective: Today, patient was seen and examined at bedside.  Still complains of mild redness and pain over the right thigh area.  Denies any shortness of breath, cough, chest pain, fever or chills.  Has had a bowel movement yesterday.  Objective: Vitals:   04/08/21 2201 04/09/21 0439  BP: 139/72 (!) 142/73  Pulse: 91 98  Resp: 18 18  Temp: 98.2 F (36.8 C) 98.9 F (37.2 C)  SpO2: 98% 100%    Intake/Output Summary (Last 24 hours) at 04/09/2021 1053 Last data filed at 04/09/2021 1012 Gross per 24 hour  Intake 1276.75 ml  Output 1100 ml  Net 176.75 ml   Filed Weights   04/08/21 0715 04/08/21 1130  Weight: (!) 147.4 kg (!) 147.4 kg   Body mass index is 50.9 kg/m.   Physical Exam: GENERAL: Patient is alert awake and oriented. Not in obvious distress.  Morbidly obese HENT: No scleral pallor or icterus. Pupils equally reactive to light. Oral mucosa is moist NECK: is supple, no gross swelling noted. CHEST: Clear to auscultation. No crackles or wheezes.  Diminished breath sounds bilaterally. CVS: S1 and S2 heard, no murmur. Regular rate  and rhythm.  ABDOMEN: Soft, non-tender, bowel sounds are present.  Obese abdomen EXTREMITIES: Bilateral chronic lymphedema, right medial thigh erythema induration and tenderness.,  Right lateral knee wound without drainage. CNS: Cranial nerves are intact. No focal motor deficits. SKIN: warm and dry , bilateral lower extremity lymphedema, right lateral knee wound  Data Review: I have personally reviewed the following laboratory data and studies,  CBC: Recent Labs  Lab 04/08/21 0500 04/09/21 0530  WBC 18.9* 15.7*  NEUTROABS 16.1*  --   HGB 9.2* 8.6*  HCT 29.9* 28.2*  MCV 79.7* 80.1  PLT 256 283   Basic Metabolic Panel: Recent Labs  Lab 04/08/21 0500 04/08/21 0857 04/09/21 0530  NA 135  --  138  K 3.1*  --  3.8  CL 104  --  105  CO2 24  --  24  GLUCOSE 135*  --  134*  BUN 11  --  8  CREATININE 1.01  --  1.05  CALCIUM 8.4*  --  8.3*  MG  --  1.7  --    Liver Function Tests: Recent Labs  Lab 04/08/21 0500 04/09/21 0530  AST 13* 18  ALT 11 11  ALKPHOS 56 60  BILITOT 0.6 0.7  PROT 7.9 7.9  ALBUMIN 2.9* 2.6*   No results for input(s): LIPASE, AMYLASE in the last 168 hours. No results for input(s): AMMONIA in the last 168 hours. Cardiac Enzymes: No results for input(s): CKTOTAL, CKMB, CKMBINDEX, TROPONINI in the last 168 hours. BNP (last 3 results) No results for input(s): BNP in the last 8760 hours.  ProBNP (last 3 results) No results for input(s): PROBNP in the last 8760 hours.  CBG: No results for input(s): GLUCAP in the last 168 hours. Recent Results (from the past 240 hour(s))  SARS CORONAVIRUS 2 (TAT 6-24 HRS) Nasopharyngeal Nasopharyngeal Swab     Status: None   Collection Time: 04/08/21  8:57 AM   Specimen: Nasopharyngeal Swab  Result Value Ref Range Status   SARS Coronavirus 2 NEGATIVE NEGATIVE Final    Comment: (NOTE) SARS-CoV-2 target nucleic acids are NOT DETECTED.  The SARS-CoV-2 RNA is generally detectable in upper and lower respiratory  specimens during the acute phase of infection. Negative results do not preclude SARS-CoV-2 infection, do not rule out co-infections with other pathogens, and should not be used as the sole basis for treatment or other patient management decisions. Negative results must be combined with clinical observations, patient history, and epidemiological information. The expected result is Negative.  Fact Sheet for Patients: HairSlick.no  Fact Sheet for Healthcare Providers: quierodirigir.com  This test is not yet approved or cleared by the [place] and  has been authorized  for detection and/or diagnosis of SARS-CoV-2 by FDA under an Emergency Use Authorization (EUA). This EUA will remain  in effect (meaning this test can be used) for the duration of the COVID-19 declaration under Se ction 564(b)(1) of the Act, 21 U.S.C. section 360bbb-3(b)(1), unless the authorization is terminated or revoked sooner.  Performed at [facility], [address] [place], [place] [address]   Culture, blood (routine x 2)     Status: None (Preliminary result)   Collection Time: 04/08/21  8:57 AM   Specimen: BLOOD  Result Value Ref Range Status   Specimen Description   Final    BLOOD RIGHT ANTECUBITAL Performed at [facility], [address], [place], [place] [address]    Special Requests   Final    BOTTLES DRAWN AEROBIC AND ANAEROBIC Blood Culture results may not be optimal due to an inadequate volume of blood received in culture bottles Performed at [facility], [address], [place], [place] [address]    Culture   Final    NO GROWTH < 24 HOURS Performed at [facility], [address], [place], [place] [address]    Report Status PENDING  Incomplete     Studies: No results found.   Joycelyn Das, MD  [facility] 04/09/2021  If 7PM-7AM, please contact  night-coverage

## 2021-04-09 NOTE — Plan of Care (Signed)
?  Problem: Elimination: ?Goal: Will not experience complications related to bowel motility ?Outcome: Progressing ?  ?Problem: Pain Managment: ?Goal: General experience of comfort will improve ?Outcome: Progressing ?  ?Problem: Safety: ?Goal: Ability to remain free from injury will improve ?Outcome: Progressing ?  ?

## 2021-04-10 ENCOUNTER — Other Ambulatory Visit (HOSPITAL_COMMUNITY): Payer: Self-pay

## 2021-04-10 DIAGNOSIS — I890 Lymphedema, not elsewhere classified: Secondary | ICD-10-CM | POA: Diagnosis not present

## 2021-04-10 DIAGNOSIS — L03115 Cellulitis of right lower limb: Secondary | ICD-10-CM | POA: Diagnosis not present

## 2021-04-10 LAB — CBC
HCT: 29.7 % — ABNORMAL LOW (ref 39.0–52.0)
Hemoglobin: 9.1 g/dL — ABNORMAL LOW (ref 13.0–17.0)
MCH: 24.7 pg — ABNORMAL LOW (ref 26.0–34.0)
MCHC: 30.6 g/dL (ref 30.0–36.0)
MCV: 80.5 fL (ref 80.0–100.0)
Platelets: 310 10*3/uL (ref 150–400)
RBC: 3.69 MIL/uL — ABNORMAL LOW (ref 4.22–5.81)
RDW: 15 % (ref 11.5–15.5)
WBC: 9.9 10*3/uL (ref 4.0–10.5)
nRBC: 0 % (ref 0.0–0.2)

## 2021-04-10 MED ORDER — CLINDAMYCIN HCL 150 MG PO CAPS
300.0000 mg | ORAL_CAPSULE | Freq: Three times a day (TID) | ORAL | 0 refills | Status: AC
  Filled 2021-04-10: qty 30, 5d supply, fill #0

## 2021-04-10 MED ORDER — POTASSIUM CHLORIDE CRYS ER 20 MEQ PO TBCR
40.0000 meq | EXTENDED_RELEASE_TABLET | Freq: Two times a day (BID) | ORAL | Status: DC
  Administered 2021-04-10: 40 meq via ORAL
  Filled 2021-04-10: qty 2

## 2021-04-10 MED ORDER — DIPHENHYDRAMINE HCL 25 MG PO CAPS: 25.0000 mg | ORAL_CAPSULE | Freq: Once | ORAL | Status: DC

## 2021-04-10 NOTE — Discharge Summary (Addendum)
Physician Discharge Summary  Todd HowellsDevon [name] HQI:[id number] DOB: April 12, 1981 DOA: 04/08/2021  PCP: Patient, No Pcp Per (Inactive)  Admit date: 04/08/2021 Discharge date: 04/10/2021  Admitted From: Home  Discharge disposition: Home  Recommendations for Outpatient Follow-Up:   . Follow up with your primary care provider in one week.  . Check CBC, BMP, magnesium in the next visit . Patient would benefit from a lymphedema pump and follow-up at the wound care clinic. Marland Kitchen. Patient would benefit from ongoing weight loss as outpatient.  Discharge Diagnosis:   Active Problems:   Cellulitis  Discharge Condition: Improved.  Diet recommendation:  Regular.  Wound care: Lower extremity care per wound clinic  Code status: Full.   History of Present Illness:   Todd Jermaine Creaseyis a 40 y.o.malewith medical history significant ofHIV, chronic lymphedema presented to hospital with right thigh redness swelling and cellulitis.  He was seen in urgent care and was given Bactrim but despite that he continued had symptoms with redness fever and tenderness.  In the ED, he was noted to have elevated white count.  Patient was then admitted to hospital for failed outpatient treatment of cellulitis.   Hospital Course:   Following conditions were addressed during hospitalization as listed below,  Right thigh cellulitis on the background of bilateral lymphedema. Failed outpatient treatment with Bactrim.  Patient received vancomycin and Rocephin during hospitalization.    He was afebrile but had WBC of 15.7 initially.  Patient was continued on IV antibiotics with significant improvement in his redness.  WBC normalized.   Blood cultures negative until the time of discharge.  Patient will be given clindamycin on discharge to complete course of antibiotic.  Patient preferred this antibiotic since this had good response in the last discharge but  Morbid obesity Will continue to benefit from  weight loss as outpatient.  HIV As per the patient, viral load undetectable.  Continue continue home regimen of Biktarvy.   Hypokalemia Was given potassium supplements prior to discharge.  Recommend BMP as outpatient  Disposition.  At this time, patient is stable for disposition home with outpatient PCP follow-up.  Medical Consultants:    None.  Procedures:    None Subjective:   Today, patient was seen and examined at bedside.  Patient feels much better today.  Has a decreased pain over the inflammation area with decreased redness.  Feels ready for discharge.  Discharge Exam:   Vitals:   04/09/21 2125 04/10/21 0610  BP: 132/65 122/69  Pulse: 92 81  Resp: 18 18  Temp: 98.5 F (36.9 C) 98 F (36.7 C)  SpO2: 98% 99%   Vitals:   04/09/21 0439 04/09/21 1448 04/09/21 2125 04/10/21 0610  BP: (!) 142/73 133/67 132/65 122/69  Pulse: 98 88 92 81  Resp: 18 18 18 18   Temp: 98.9 F (37.2 C) 98.3 F (36.8 C) 98.5 F (36.9 C) 98 F (36.7 C)  TempSrc: Oral     SpO2: 100% 100% 98% 99%  Weight:      Height:        General: Alert awake, not in obvious distress, morbidly obese HENT: pupils equally reacting to light,  No scleral pallor or icterus noted. Oral mucosa is moist.  Chest:  Clear breath sounds.  Diminished breath sounds bilaterally. No crackles or wheezes.  CVS: S1 &S2 heard. No murmur.  Regular rate and rhythm. Abdomen: Soft, nontender, nondistended.  Bowel sounds are heard.   Extremities: No cyanosis, clubbing with bilateral chronic lymphedema, right medial thigh erythema,  induration and tenderness which has significantly decreased, peripheral pulses are palpable. Psych: Alert, awake and oriented, normal mood CNS:  No cranial nerve deficits.  Power equal in all extremities.   Skin: Warm and dry.   The results of significant diagnostics from this hospitalization (including imaging, microbiology, ancillary and laboratory) are listed below for reference.      Diagnostic Studies:   No results found.   Labs:   Basic Metabolic Panel: Recent Labs  Lab 04/08/21 0500 04/08/21 0857 04/09/21 0530 04/09/21 1117  NA 135  --  138 135  K 3.1*  --  3.8 3.3*  CL 104  --  105 103  CO2 24  --  24 25  GLUCOSE 135*  --  134* 118*  BUN 11  --  8 8  CREATININE 1.01  --  1.05 0.94  CALCIUM 8.4*  --  8.3* 8.2*  MG  --  1.7  --   --    GFR Estimated Creatinine Clearance: 144.2 mL/min (by C-G formula based on SCr of 0.94 mg/dL). Liver Function Tests: Recent Labs  Lab 04/08/21 0500 04/09/21 0530  AST 13* 18  ALT 11 11  ALKPHOS 56 60  BILITOT 0.6 0.7  PROT 7.9 7.9  ALBUMIN 2.9* 2.6*   No results for input(s): LIPASE, AMYLASE in the last 168 hours. No results for input(s): AMMONIA in the last 168 hours. Coagulation profile No results for input(s): INR, PROTIME in the last 168 hours.  CBC: Recent Labs  Lab 04/08/21 0500 04/09/21 0530 04/10/21 0515  WBC 18.9* 15.7* 9.9  NEUTROABS 16.1*  --   --   HGB 9.2* 8.6* 9.1*  HCT 29.9* 28.2* 29.7*  MCV 79.7* 80.1 80.5  PLT 256 283 310   Cardiac Enzymes: No results for input(s): CKTOTAL, CKMB, CKMBINDEX, TROPONINI in the last 168 hours. BNP: Invalid input(s): POCBNP CBG: No results for input(s): GLUCAP in the last 168 hours. D-Dimer No results for input(s): DDIMER in the last 72 hours. Hgb A1c No results for input(s): HGBA1C in the last 72 hours. Lipid Profile No results for input(s): CHOL, HDL, LDLCALC, TRIG, CHOLHDL, LDLDIRECT in the last 72 hours. Thyroid function studies No results for input(s): TSH, T4TOTAL, T3FREE, THYROIDAB in the last 72 hours.  Invalid input(s): FREET3 Anemia work up Recent Labs    04/08/21 0857  TIBC 219*  IRON 17*   Microbiology Recent Results (from the past 240 hour(s))  SARS CORONAVIRUS 2 (TAT 6-24 HRS) Nasopharyngeal Nasopharyngeal Swab     Status: None   Collection Time: 04/08/21  8:57 AM   Specimen: Nasopharyngeal Swab  Result Value Ref  Range Status   SARS Coronavirus 2 NEGATIVE NEGATIVE Final    Comment: (NOTE) SARS-CoV-2 target nucleic acids are NOT DETECTED.  The SARS-CoV-2 RNA is generally detectable in upper and lower respiratory specimens during the acute phase of infection. Negative results do not preclude SARS-CoV-2 infection, do not rule out co-infections with other pathogens, and should not be used as the sole basis for treatment or other patient management decisions. Negative results must be combined with clinical observations, patient history, and epidemiological information. The expected result is Negative.  Fact Sheet for Patients: HairSlick.no  Fact Sheet for Healthcare Providers: quierodirigir.com  This test is not yet approved or cleared by the [place] FDA and  has been authorized for detection and/or diagnosis of SARS-CoV-2 by FDA under an Emergency Use Authorization (EUA). This EUA will remain  in effect (meaning this test can be used)  for the duration of the COVID-19 declaration under Se ction 564(b)(1) of the Act, 21 U.S.C. section 360bbb-3(b)(1), unless the authorization is terminated or revoked sooner.  Performed at [facility], [address], [place], [place] [address]   Culture, blood (routine x 2)     Status: None (Preliminary result)   Collection Time: 04/08/21  8:57 AM   Specimen: BLOOD  Result Value Ref Range Status   Specimen Description   Final    BLOOD RIGHT ANTECUBITAL Performed at [facility], [address], [place], [place] [address]    Special Requests   Final    BOTTLES DRAWN AEROBIC AND ANAEROBIC Blood Culture results may not be optimal due to an inadequate volume of blood received in culture bottles Performed at [facility], [address], [place], [place] [address]    Culture   Final    NO GROWTH 2 DAYS Performed at [facility], [address]  [place], [place] [address]    Report Status PENDING  Incomplete     Discharge Instructions:   Discharge Instructions    Call MD for:  redness, tenderness, or signs of infection (pain, swelling, redness, odor or green/yellow discharge around incision site)   Complete by: As directed    Call MD for:  temperature >100.4   Complete by: As directed    Diet general   Complete by: As directed    Discharge instructions   Complete by: As directed    Follow up with your primary care provider in one week. Continue to follow up with wound clinic. You will benefit from lymphedema pump. Keep legs elevated while sitting or lying down. Complete the course of antibiotics.   Discharge wound care:   Complete by: As directed    Leg care   Increase activity slowly   Complete by: As directed      Allergies as of 04/10/2021      Reactions   Morphine And Related Nausea And Vomiting, Other (See Comments)   Elevated blood pressure   Pork-derived Products Other (See Comments)   No pork for religious reasons - doesn't eat pork No pork for religious reasons   Shellfish Allergy Other (See Comments)   No shrimp for religious reasons -doesn't eat shrimp religious puroses No shrimp for religious reasons      Medication List    TAKE these medications   acetaminophen 500 MG tablet Commonly known as: TYLENOL Take 1,000 mg by mouth every 6 (six) hours as needed for headache (pain).   Biktarvy 50-200-25 MG Tabs tablet Generic drug: bictegravir-emtricitabine-tenofovir AF TAKE 1 TABLET BY MOUTH DAILY. What changed: when to take this   clindamycin 150 MG capsule Commonly known as: Cleocin Take 2 capsules (300 mg total) by mouth 3 (three) times daily for 5 days.   ibuprofen 200 MG tablet Commonly known as: ADVIL Take 800 mg by mouth every 6 (six) hours as needed (pain/headache).            Discharge Care Instructions  (From admission, onward)         Start     Ordered   04/10/21 0000  Discharge  wound care:       Comments: Leg care   04/10/21 0935          Follow-up Information    Primary care provider. Schedule an appointment as soon as possible for a visit in 1 week(s).  Time coordinating discharge: 39 minutes  Signed:  Dior Dominik  [facility] 04/10/2021, 9:35 AM

## 2021-04-13 LAB — CULTURE, BLOOD (ROUTINE X 2): Culture: NO GROWTH

## 2021-04-20 DIAGNOSIS — Z419 Encounter for procedure for purposes other than remedying health state, unspecified: Secondary | ICD-10-CM | POA: Diagnosis not present

## 2021-04-30 ENCOUNTER — Other Ambulatory Visit (HOSPITAL_COMMUNITY): Payer: Self-pay

## 2021-05-02 ENCOUNTER — Other Ambulatory Visit (HOSPITAL_COMMUNITY): Payer: Self-pay

## 2021-05-02 ENCOUNTER — Other Ambulatory Visit: Payer: Self-pay | Admitting: Infectious Diseases

## 2021-05-02 DIAGNOSIS — B20 Human immunodeficiency virus [HIV] disease: Secondary | ICD-10-CM

## 2021-05-02 MED ORDER — BIKTARVY 50-200-25 MG PO TABS
1.0000 | ORAL_TABLET | Freq: Every day | ORAL | 0 refills | Status: DC
  Filled 2021-05-02: qty 30, 30d supply, fill #0

## 2021-05-06 ENCOUNTER — Other Ambulatory Visit (HOSPITAL_COMMUNITY): Payer: Self-pay

## 2021-05-08 ENCOUNTER — Ambulatory Visit: Payer: Medicaid Other | Admitting: Infectious Diseases

## 2021-05-15 ENCOUNTER — Ambulatory Visit: Payer: Medicaid Other | Admitting: Infectious Diseases

## 2021-05-20 ENCOUNTER — Other Ambulatory Visit: Payer: Self-pay

## 2021-05-20 ENCOUNTER — Ambulatory Visit (INDEPENDENT_AMBULATORY_CARE_PROVIDER_SITE_OTHER): Payer: Medicaid Other | Admitting: Infectious Diseases

## 2021-05-20 DIAGNOSIS — L03115 Cellulitis of right lower limb: Secondary | ICD-10-CM

## 2021-05-20 DIAGNOSIS — B20 Human immunodeficiency virus [HIV] disease: Secondary | ICD-10-CM

## 2021-05-20 DIAGNOSIS — L03119 Cellulitis of unspecified part of limb: Secondary | ICD-10-CM
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
## 2021-05-20 NOTE — Assessment & Plan Note (Signed)
Will ask to pharm to help with getting orlistat

## 2021-05-20 NOTE — Assessment & Plan Note (Addendum)
He appears to be doing well.  Will continue on biktarvy, not interested in cabaneuva.  He will stop by lab when convenient Will see him in 6 months

## 2021-05-20 NOTE — Assessment & Plan Note (Signed)
Improved, encouraged him to wear compression stockings.

## 2021-05-20 NOTE — Assessment & Plan Note (Deleted)
Improved, encouraged him to wear compression stockings.  

## 2021-05-21 DIAGNOSIS — Z419 Encounter for procedure for purposes other than remedying health state, unspecified: Secondary | ICD-10-CM | POA: Diagnosis not present

## 2021-05-28 ENCOUNTER — Other Ambulatory Visit (HOSPITAL_COMMUNITY): Payer: Self-pay

## 2021-05-28 ENCOUNTER — Other Ambulatory Visit: Payer: Self-pay | Admitting: Infectious Diseases

## 2021-05-28 DIAGNOSIS — B20 Human immunodeficiency virus [HIV] disease: Secondary | ICD-10-CM

## 2021-05-28 MED ORDER — BIKTARVY 50-200-25 MG PO TABS
1.0000 | ORAL_TABLET | Freq: Every day | ORAL | 5 refills | Status: DC
Start: 2021-05-28 — End: 2022-07-22
  Filled 2021-05-28 – 2021-09-18 (×4): qty 30, 30d supply, fill #0
  Filled 2021-10-13 – 2021-11-07 (×2): qty 30, 30d supply, fill #1
  Filled 2021-12-02 – 2022-01-21 (×3): qty 30, 30d supply, fill #2
  Filled 2022-02-25 – 2022-03-23 (×2): qty 30, 30d supply, fill #3
  Filled 2022-04-20 – 2022-05-14 (×3): qty 30, 30d supply, fill #4

## 2021-05-29 ENCOUNTER — Other Ambulatory Visit (HOSPITAL_COMMUNITY): Payer: Self-pay

## 2021-06-06 ENCOUNTER — Other Ambulatory Visit (HOSPITAL_COMMUNITY): Payer: Self-pay

## 2021-06-13 ENCOUNTER — Encounter (HOSPITAL_COMMUNITY): Payer: Self-pay

## 2021-06-13 ENCOUNTER — Other Ambulatory Visit: Payer: Self-pay

## 2021-06-13 ENCOUNTER — Other Ambulatory Visit: Payer: Medicaid Other

## 2021-06-13 ENCOUNTER — Ambulatory Visit (HOSPITAL_COMMUNITY)
Admission: EM | Admit: 2021-06-13 | Discharge: 2021-06-13 | Disposition: A | Discharge status: Home / Self Care | Payer: Medicaid Other | Attending: Emergency Medicine | Admitting: Emergency Medicine

## 2021-06-13 DIAGNOSIS — B20 Human immunodeficiency virus [HIV] disease: Secondary | ICD-10-CM | POA: Diagnosis not present

## 2021-06-13 DIAGNOSIS — Z113 Encounter for screening for infections with a predominantly sexual mode of transmission: Secondary | ICD-10-CM | POA: Diagnosis not present

## 2021-06-13 DIAGNOSIS — Z79899 Other long term (current) drug therapy: Secondary | ICD-10-CM

## 2021-06-13 DIAGNOSIS — L03115 Cellulitis of right lower limb: Secondary | ICD-10-CM | POA: Diagnosis not present

## 2021-06-13 MED ORDER — CLINDAMYCIN HCL 150 MG PO CAPS: 300.0000 mg | ORAL_CAPSULE | Freq: Three times a day (TID) | ORAL | 0 refills | Status: AC

## 2021-06-13 NOTE — Discharge Instructions (Addendum)
Take the Clindamycin three times a day for the next 7 days.    You can take Ibuprofen and/or Tylenol as needed for pain relief and fever reduction.   Go to the ED for further evaluation of any worsening symptoms, including worsening redness or swelling or if you develop a fever.
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
## 2021-06-13 NOTE — Addendum Note (Signed)
Addended by: Tressa Busman T on: 06/13/2021 10:26 AM   Modules accepted: Orders

## 2021-06-13 NOTE — ED Triage Notes (Signed)
Pt in with c/o right lower leg pain, weeping, and cellulitis, x 2 days  States it is "hot and blistery"  States he was treated for cellulitis approx 2 months ago

## 2021-06-17 LAB — HIV-1 RNA QUANT-NO REFLEX-BLD
HIV 1 RNA Quant: NOT DETECTED Copies/mL
HIV-1 RNA Quant, Log: NOT DETECTED Log cps/mL

## 2021-06-17 LAB — CBC WITH DIFFERENTIAL/PLATELET
Absolute Monocytes: 365 cells/uL (ref 200–950)
Basophils Absolute: 20 cells/uL (ref 0–200)
Basophils Relative: 0.1 %
Eosinophils Absolute: 81 cells/uL (ref 15–500)
Eosinophils Relative: 0.4 %
HCT: 33.3 % — ABNORMAL LOW (ref 38.5–50.0)
Hemoglobin: 10 g/dL — ABNORMAL LOW (ref 13.2–17.1)
Lymphs Abs: 1096 cells/uL (ref 850–3900)
MCH: 23.6 pg — ABNORMAL LOW (ref 27.0–33.0)
MCHC: 30 g/dL — ABNORMAL LOW (ref 32.0–36.0)
MCV: 78.7 fL — ABNORMAL LOW (ref 80.0–100.0)
MPV: 11.1 fL (ref 7.5–12.5)
Monocytes Relative: 1.8 %
Neutro Abs: 18737 cells/uL — ABNORMAL HIGH (ref 1500–7800)
Neutrophils Relative %: 92.3 %
Platelets: 327 10*3/uL (ref 140–400)
RBC: 4.23 10*6/uL (ref 4.20–5.80)
RDW: 13.6 % (ref 11.0–15.0)
Total Lymphocyte: 5.4 %
WBC: 20.3 10*3/uL — ABNORMAL HIGH (ref 3.8–10.8)

## 2021-06-17 LAB — RPR: RPR Ser Ql: REACTIVE — AB

## 2021-06-17 LAB — COMPLETE METABOLIC PANEL WITH GFR
AG Ratio: 0.8 (calc) — ABNORMAL LOW (ref 1.0–2.5)
ALT: 8 U/L — ABNORMAL LOW (ref 9–46)
AST: 9 U/L — ABNORMAL LOW (ref 10–40)
Albumin: 3.5 g/dL — ABNORMAL LOW (ref 3.6–5.1)
Alkaline phosphatase (APISO): 77 U/L (ref 36–130)
BUN/Creatinine Ratio: 7 (calc) (ref 6–22)
BUN: 6 mg/dL — ABNORMAL LOW (ref 7–25)
CO2: 25 mmol/L (ref 20–32)
Calcium: 9 mg/dL (ref 8.6–10.3)
Chloride: 103 mmol/L (ref 98–110)
Creat: 0.81 mg/dL (ref 0.60–1.35)
GFR, Est African American: 128 mL/min/{1.73_m2} (ref >=60)
GFR, Est Non African American: 110 mL/min/{1.73_m2} (ref >=60)
Globulin: 4.5 g/dL (calc) — ABNORMAL HIGH (ref 1.9–3.7)
Glucose, Bld: 110 mg/dL — ABNORMAL HIGH (ref 65–99)
Potassium: 3.6 mmol/L (ref 3.5–5.3)
Sodium: 137 mmol/L (ref 135–146)
Total Bilirubin: 0.4 mg/dL (ref 0.2–1.2)
Total Protein: 8 g/dL (ref 6.1–8.1)

## 2021-06-17 LAB — LIPID PANEL
Cholesterol: 125 mg/dL (ref <200)
HDL: 49 mg/dL (ref >=40)
LDL Cholesterol (Calc): 62 mg/dL (calc)
Non-HDL Cholesterol (Calc): 76 mg/dL (calc) (ref <130)
Total CHOL/HDL Ratio: 2.6 (calc) (ref <5.0)
Triglycerides: 48 mg/dL (ref <150)

## 2021-06-17 LAB — T-HELPER CELLS (CD4) COUNT (NOT AT ARMC)
Absolute CD4: 438 cells/uL — ABNORMAL LOW (ref 490–1740)
CD4 T Helper %: 43 % (ref 30–61)
Total lymphocyte count: 1021 cells/uL (ref 850–3900)

## 2021-06-17 LAB — FLUORESCENT TREPONEMAL AB(FTA)-IGG-BLD: Fluorescent Treponemal ABS: REACTIVE — AB

## 2021-06-17 LAB — RPR TITER: RPR Titer: 1:8 {titer} — ABNORMAL HIGH

## 2021-06-20 DIAGNOSIS — Z419 Encounter for procedure for purposes other than remedying health state, unspecified: Secondary | ICD-10-CM | POA: Diagnosis not present

## 2021-06-25 ENCOUNTER — Other Ambulatory Visit (HOSPITAL_COMMUNITY): Payer: Self-pay

## 2021-06-27 ENCOUNTER — Other Ambulatory Visit (HOSPITAL_COMMUNITY): Payer: Self-pay

## 2021-07-01 ENCOUNTER — Telehealth: Payer: Self-pay

## 2021-07-01 ENCOUNTER — Ambulatory Visit: Payer: Medicaid Other | Admitting: Infectious Diseases

## 2021-07-01 NOTE — Telephone Encounter (Signed)
Called patient - left voice message asking to return my call to reschedule missed appointment today.   Camari Quintanilla Lesli Albee, CMA

## 2021-07-11 ENCOUNTER — Other Ambulatory Visit (HOSPITAL_COMMUNITY): Payer: Self-pay

## 2021-07-21 ENCOUNTER — Other Ambulatory Visit (HOSPITAL_COMMUNITY): Payer: Self-pay

## 2021-07-21 DIAGNOSIS — Z419 Encounter for procedure for purposes other than remedying health state, unspecified: Secondary | ICD-10-CM | POA: Diagnosis not present

## 2021-07-22 ENCOUNTER — Other Ambulatory Visit (HOSPITAL_COMMUNITY): Payer: Self-pay

## 2021-08-08 ENCOUNTER — Other Ambulatory Visit (HOSPITAL_COMMUNITY): Payer: Self-pay

## 2021-08-21 DIAGNOSIS — Z419 Encounter for procedure for purposes other than remedying health state, unspecified: Secondary | ICD-10-CM | POA: Diagnosis not present

## 2021-08-22 ENCOUNTER — Other Ambulatory Visit (HOSPITAL_COMMUNITY): Payer: Self-pay

## 2021-08-30 ENCOUNTER — Other Ambulatory Visit (HOSPITAL_COMMUNITY): Payer: Self-pay

## 2021-09-18 ENCOUNTER — Other Ambulatory Visit (HOSPITAL_COMMUNITY): Payer: Self-pay

## 2021-09-20 DIAGNOSIS — Z419 Encounter for procedure for purposes other than remedying health state, unspecified: Secondary | ICD-10-CM | POA: Diagnosis not present

## 2021-09-28 ENCOUNTER — Encounter (HOSPITAL_COMMUNITY): Payer: Self-pay

## 2021-09-28 ENCOUNTER — Ambulatory Visit (HOSPITAL_COMMUNITY)
Admission: EM | Admit: 2021-09-28 | Discharge: 2021-09-28 | Disposition: A | Discharge status: Home / Self Care | Payer: Medicaid Other | Attending: Student | Admitting: Student

## 2021-09-28 ENCOUNTER — Other Ambulatory Visit: Payer: Self-pay

## 2021-09-28 DIAGNOSIS — K047 Periapical abscess without sinus: Secondary | ICD-10-CM | POA: Diagnosis not present

## 2021-09-28 DIAGNOSIS — L03115 Cellulitis of right lower limb: Secondary | ICD-10-CM

## 2021-09-28 MED ORDER — LIDOCAINE VISCOUS HCL 2 % MT SOLN: 15.0000 mL | OROMUCOSAL | 0 refills | Status: DC | PRN

## 2021-09-28 MED ORDER — CLINDAMYCIN HCL 300 MG PO CAPS: 300.0000 mg | ORAL_CAPSULE | Freq: Three times a day (TID) | ORAL | 0 refills | Status: AC
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
## 2021-09-28 NOTE — ED Triage Notes (Signed)
Pt presents with tooth pain x 1 month. States a tooth broke and caused an infection.

## 2021-09-28 NOTE — Discharge Instructions (Addendum)
-  For dental pain, use lidocaine mouthwash up to every 4 hours. Make sure not to eat for at least 1 hour after using this, as your mouth will be very numb and you could bite yourself. -Clindamycin 300 mg total by mouth 3 (three) times daily for 7 days. -Wash your wound with gentle soap and water 1-2 times daily.  Let air dry or gently pat. You can follow with over-the-counter neosporin ointment (or similar). Keep wrapped during the day or when you're doing something that could get it dirty (working, [employer], cooking, Catering manager). Avoid cleansing with hydrogen peroxide or alcohol!! -Seek additional medical attention if the wound is getting worse instead of better- redness increasing in size, pain getting worse, new/worsening discharge, new fevers/chills, etc.

## 2021-10-13 ENCOUNTER — Other Ambulatory Visit (HOSPITAL_COMMUNITY): Payer: Self-pay

## 2021-10-15 ENCOUNTER — Other Ambulatory Visit (HOSPITAL_COMMUNITY): Payer: Self-pay

## 2021-10-20 ENCOUNTER — Other Ambulatory Visit (HOSPITAL_COMMUNITY): Payer: Self-pay

## 2021-10-21 DIAGNOSIS — Z419 Encounter for procedure for purposes other than remedying health state, unspecified: Secondary | ICD-10-CM | POA: Diagnosis not present

## 2021-10-29 ENCOUNTER — Other Ambulatory Visit (HOSPITAL_COMMUNITY): Payer: Self-pay

## 2021-11-07 ENCOUNTER — Other Ambulatory Visit (HOSPITAL_COMMUNITY): Payer: Self-pay

## 2021-11-13 ENCOUNTER — Encounter (HOSPITAL_COMMUNITY): Payer: Self-pay | Admitting: *Deleted

## 2021-11-13 ENCOUNTER — Emergency Department (HOSPITAL_COMMUNITY)
Admission: EM | Admit: 2021-11-13 | Discharge: 2021-11-13 | Disposition: A | Discharge status: Home / Self Care | Payer: Medicaid Other | Attending: Emergency Medicine | Admitting: Emergency Medicine

## 2021-11-13 DIAGNOSIS — Z743 Need for continuous supervision: Secondary | ICD-10-CM | POA: Diagnosis not present

## 2021-11-13 DIAGNOSIS — L02415 Cutaneous abscess of right lower limb: Secondary | ICD-10-CM | POA: Diagnosis not present

## 2021-11-13 DIAGNOSIS — R2241 Localized swelling, mass and lump, right lower limb: Secondary | ICD-10-CM | POA: Diagnosis present

## 2021-11-13 DIAGNOSIS — I10 Essential (primary) hypertension: Secondary | ICD-10-CM | POA: Diagnosis not present

## 2021-11-13 DIAGNOSIS — L0390 Cellulitis, unspecified: Secondary | ICD-10-CM | POA: Diagnosis not present

## 2021-11-13 DIAGNOSIS — Z21 Asymptomatic human immunodeficiency virus [HIV] infection status: Secondary | ICD-10-CM | POA: Insufficient documentation

## 2021-11-13 DIAGNOSIS — E860 Dehydration: Secondary | ICD-10-CM | POA: Diagnosis not present

## 2021-11-13 DIAGNOSIS — R609 Edema, unspecified: Secondary | ICD-10-CM | POA: Diagnosis not present

## 2021-11-13 DIAGNOSIS — Z79899 Other long term (current) drug therapy: Secondary | ICD-10-CM | POA: Diagnosis not present

## 2021-11-13 DIAGNOSIS — M7989 Other specified soft tissue disorders: Secondary | ICD-10-CM | POA: Diagnosis not present

## 2021-11-13 DIAGNOSIS — L03115 Cellulitis of right lower limb: Secondary | ICD-10-CM | POA: Diagnosis not present

## 2021-11-13 LAB — I-STAT CHEM 8, ED
BUN: 17 mg/dL (ref 6–20)
Calcium, Ion: 1.09 mmol/L — ABNORMAL LOW (ref 1.15–1.40)
Chloride: 96 mmol/L — ABNORMAL LOW (ref 98–111)
Creatinine, Ser: 1.2 mg/dL (ref 0.61–1.24)
Glucose, Bld: 129 mg/dL — ABNORMAL HIGH (ref 70–99)
HCT: 35 % — ABNORMAL LOW (ref 39.0–52.0)
Hemoglobin: 11.9 g/dL — ABNORMAL LOW (ref 13.0–17.0)
Potassium: 3.1 mmol/L — ABNORMAL LOW (ref 3.5–5.1)
Sodium: 136 mmol/L (ref 135–145)
TCO2: 28 mmol/L (ref 22–32)

## 2021-11-13 MED ORDER — ACETAMINOPHEN 325 MG PO TABS
650.0000 mg | ORAL_TABLET | Freq: Once | ORAL | Status: AC
  Administered 2021-11-13: 650 mg via ORAL
  Filled 2021-11-13: qty 2

## 2021-11-13 MED ORDER — DEXTROSE 5 % IV SOLN
1500.0000 mg | Freq: Once | INTRAVENOUS | Status: AC
  Administered 2021-11-13: 1500 mg via INTRAVENOUS
  Filled 2021-11-13: qty 75

## 2021-11-13 NOTE — Progress Notes (Signed)
Pharmacy Note:  [facility] for Acute Bacterial Skin and Skin Structure Infection (ABSSSI) Patients to [facility] Todd Parrish is an 40 y.o. male who presented to [facility] on 11/13/2021 with an Acute Bacterial Skin and Skin Structure Infection  Inclusion criteria - Indication []  Moderately large skin lesion (>=75 cm2 or larger - about the size of a baseball) [x]  Cellulitis  Inclusion Criteria - at least one SIRS criteria present [x]  WBC > 12,000 or < 4000 []  temp >100.9 or < 96.8 []  heart rate >90[]  respiratory rate >20  Patient was evaluated for the following exclusion criteria and no exclusions were found  Hardware involvement, Hypotension / shock, Elevated lactate (>2) without other explanation, ram-negative infection risk factors (bites, water exposure, infection after trauma, infection after skin graft, neutropenia, burns, severe immunocompromise), necrotizing fasciitis possible or confirmed, Known or suspected osteomyelitis or septic arthritis, endocarditis, diabetic foot infection, ischemic ulcers, post-operative wound infection, perirectal infections, need for drainage in the operating room, hand or facial infections, injection drug users with a fever, bacteremia, pregnancy or breastfeeding, allergy to related antibiotics like vancomycin, known liver disease (t.bili >2x ULN or AST/ALT 3x ULN)  , PharmD, [facility] Clinical Pharmacist ED RPh Phone: [phone] Main RX: [phone]
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
## 2021-11-13 NOTE — ED Triage Notes (Signed)
Pt states recurrent R LE infections.  States took antibiotics for RLE infection 3 weeks ago.  States 3 days ago leg began swelling (doubled in size per pt) with purulent drainage.  Hx of DVT.

## 2021-11-13 NOTE — Discharge Instructions (Addendum)
You are given a dose of an antibiotic called dalbavancin today.  I recommend following up with either your primary care doctor's disease doctor at the 1 week mark so that they can recheck your leg and make sure that it is improving. Elevate your leg and use the compression legging you use at home.   You may use Tylenol and ibuprofen at home for pain.  Try plenty water.  I expect your symptoms to improve in 3 days approximately -you may always return to the emergency room for reevaluation.

## 2021-11-20 DIAGNOSIS — Z419 Encounter for procedure for purposes other than remedying health state, unspecified: Secondary | ICD-10-CM | POA: Diagnosis not present

## 2021-11-27 ENCOUNTER — Other Ambulatory Visit (HOSPITAL_COMMUNITY): Payer: Self-pay

## 2021-11-28 ENCOUNTER — Other Ambulatory Visit (HOSPITAL_COMMUNITY): Payer: Self-pay

## 2021-12-02 ENCOUNTER — Other Ambulatory Visit (HOSPITAL_COMMUNITY): Payer: Self-pay

## 2021-12-10 ENCOUNTER — Other Ambulatory Visit (HOSPITAL_COMMUNITY): Payer: Self-pay

## 2021-12-21 DIAGNOSIS — Z419 Encounter for procedure for purposes other than remedying health state, unspecified: Secondary | ICD-10-CM | POA: Diagnosis not present

## 2021-12-30 ENCOUNTER — Other Ambulatory Visit (HOSPITAL_COMMUNITY): Payer: Self-pay

## 2021-12-31 ENCOUNTER — Other Ambulatory Visit (HOSPITAL_COMMUNITY): Payer: Self-pay

## 2022-01-09 ENCOUNTER — Other Ambulatory Visit (HOSPITAL_COMMUNITY): Payer: Self-pay

## 2022-01-21 ENCOUNTER — Other Ambulatory Visit (HOSPITAL_COMMUNITY): Payer: Self-pay

## 2022-01-21 DIAGNOSIS — Z419 Encounter for procedure for purposes other than remedying health state, unspecified: Secondary | ICD-10-CM | POA: Diagnosis not present

## 2022-01-26 ENCOUNTER — Other Ambulatory Visit (HOSPITAL_COMMUNITY): Payer: Self-pay

## 2022-02-17 ENCOUNTER — Other Ambulatory Visit (HOSPITAL_COMMUNITY): Payer: Self-pay

## 2022-02-18 DIAGNOSIS — Z419 Encounter for procedure for purposes other than remedying health state, unspecified: Secondary | ICD-10-CM | POA: Diagnosis not present

## 2022-02-23 DIAGNOSIS — L03115 Cellulitis of right lower limb: Secondary | ICD-10-CM | POA: Diagnosis not present

## 2022-02-23 DIAGNOSIS — I890 Lymphedema, not elsewhere classified: Secondary | ICD-10-CM | POA: Diagnosis not present

## 2022-02-25 ENCOUNTER — Other Ambulatory Visit (HOSPITAL_COMMUNITY): Payer: Self-pay

## 2022-03-06 ENCOUNTER — Other Ambulatory Visit (HOSPITAL_COMMUNITY): Payer: Self-pay

## 2022-03-17 DIAGNOSIS — L03115 Cellulitis of right lower limb: Secondary | ICD-10-CM | POA: Diagnosis not present

## 2022-03-20 NOTE — Progress Notes (Addendum)
WANYE, ZHENG ([id number]) Visit Report for 03/23/2022 Allergy List Details Patient Name: Date of Service: Todd Parrish 03/23/2022 8:00 A M Medical Record Number: [id number] Patient Account Number: [id number] Date of Birth/Sex: Treating RN: May 17, 1981 (40 y.o. Male) Rhae Hammock Primary Care Domenique Quest: PA Haig Prophet, NO Other Clinician: Referring Wrenna Saks: Treating Deandrea Vanpelt/Extender: Yaakov Guthrie in Treatment: 0 Allergies Active Allergies morphine Reaction: couldnt breath Severity: Severe Shellfish Containing Products Allergy Notes Electronic Signature(s) Signed: 03/23/2022 3:48:01 PM By: Rhae Hammock RN Previous Signature: 03/20/2022 11:22:23 AM Version By: Valeria Batman EMT Entered By: Rhae Hammock on 03/23/2022 08:42:47 -------------------------------------------------------------------------------- [place] Details Patient Name: Date of Service: Todd Parrish, Todd Parrish. 03/23/2022 8:00 A M Medical Record Number: [id number] Patient Account Number: [id number] Date of Birth/Sex: Treating RN: 07/23/1981 (40 y.o. Male) Rhae Hammock Primary Care Shemeika Starzyk: PA Haig Prophet, NO Other Clinician: Referring Joylene Wescott: Treating Shafiq Larch/Extender: Yaakov Guthrie in Treatment: 0 Visit Information Patient Arrived: Ambulatory Arrival Time: 08:41 Accompanied By: self Transfer Assistance: None Patient Identification Verified: Yes Secondary Verification Process Completed: Yes Patient Requires Transmission-Based Precautions: No Patient Has Alerts: No History Since Last Visit Added or deleted any medications: No Any new allergies or adverse reactions: No Had a fall or experienced change in activities of daily living that may affect risk of falls: No Signs or symptoms of abuse/neglect since last visito No Hospitalized since last visit: No Implantable device outside of the clinic excluding cellular tissue based products placed in the center since last visit:  No Electronic Signature(s) Signed: 03/23/2022 3:48:01 PM By: Rhae Hammock RN Entered By: Rhae Hammock on 03/23/2022 08:41:56 -------------------------------------------------------------------------------- Clinic Level of Care Assessment Details Patient Name: Date of Service: Todd Parrish 03/23/2022 8:00 A M Medical Record Number: [id number] Patient Account Number: [id number] Date of Birth/Sex: Treating RN: 30-Sep-1981 (40 y.o. Male) Rhae Hammock Primary Care Siler Mavis: PA Haig Prophet, NO Other Clinician: Referring Brant Peets: Treating Damiyah Ditmars/Extender: Yaakov Guthrie in Treatment: 0 Clinic Level of Care Assessment Items TOOL 4 Quantity Score X- 1 0 Use when only an EandM is performed on FOLLOW-UP visit ASSESSMENTS - Nursing Assessment / Reassessment X- 1 10 Reassessment of Co-morbidities (includes updates in patient status) X- 1 5 Reassessment of Adherence to Treatment Plan ASSESSMENTS - Wound and Skin A ssessment / Reassessment []  - 0 Simple Wound Assessment / Reassessment - one wound X- 2 5 Complex Wound Assessment / Reassessment - multiple wounds []  - 0 Dermatologic / Skin Assessment (not related to wound area) ASSESSMENTS - Focused Assessment X- 1 5 Circumferential Edema Measurements - multi extremities []  - 0 Nutritional Assessment / Counseling / Intervention []  - 0 Lower Extremity Assessment (monofilament, tuning fork, pulses) []  - 0 Peripheral Arterial Disease Assessment (using hand held doppler) ASSESSMENTS - Ostomy and/or Continence Assessment and Care []  - 0 Incontinence Assessment and Management []  - 0 Ostomy Care Assessment and Management (repouching, etc.) PROCESS - Coordination of Care []  - 0 Simple Patient / Family Education for ongoing care X- 1 20 Complex (extensive) Patient / Family Education for ongoing care X- 1 10 Staff obtains Programmer, systems, Records, T Results / Process Orders est []  - 0 Staff telephones HHA, Nursing Homes /  Clarify orders / etc []  - 0 Routine Transfer to another Facility (non-emergent condition) []  - 0 Routine Hospital Admission (non-emergent condition) X- 1 15 New Admissions / Biomedical engineer / Ordering NPWT Apligraf, etc. , []  - 0 Emergency Hospital Admission (emergent condition) []  - 0 Simple Discharge Coordination X- 1  15 Complex (extensive) Discharge Coordination PROCESS - Special Needs []  - 0 Pediatric / Minor Patient Management []  - 0 Isolation Patient Management []  - 0 Hearing / Language / Visual special needs []  - 0 Assessment of Community assistance (transportation, D/C planning, etc.) []  - 0 Additional assistance / Altered mentation []  - 0 Support Surface(s) Assessment (bed, cushion, seat, etc.) INTERVENTIONS - Wound Cleansing / Measurement []  - 0 Simple Wound Cleansing - one wound X- 2 5 Complex Wound Cleansing - multiple wounds X- 1 5 Wound Imaging (photographs - any number of wounds) []  - 0 Wound Tracing (instead of photographs) []  - 0 Simple Wound Measurement - one wound X- 2 5 Complex Wound Measurement - multiple wounds INTERVENTIONS - Wound Dressings []  - 0 Small Wound Dressing one or multiple wounds X- 2 15 Medium Wound Dressing one or multiple wounds []  - 0 Large Wound Dressing one or multiple wounds X- 1 5 Application of Medications - topical []  - 0 Application of Medications - injection INTERVENTIONS - Miscellaneous []  - 0 External ear exam []  - 0 Specimen Collection (cultures, biopsies, blood, body fluids, etc.) []  - 0 Specimen(s) / Culture(s) sent or taken to Lab for analysis []  - 0 Patient Transfer (multiple staff / Civil Service fast streamer / Similar devices) []  - 0 Simple Staple / Suture removal (25 or less) []  - 0 Complex Staple / Suture removal (26 or more) []  - 0 Hypo / Hyperglycemic Management (close monitor of Blood Glucose) []  - 0 Ankle / Brachial Index (ABI) - do not check if billed separately X- 1 5 Vital Signs Has the  patient been seen at the hospital within the last three years: Yes Total Score: 155 Level Of Care: New/Established - Level 4 Electronic Signature(s) Signed: 03/23/2022 3:48:01 PM By: Rhae Hammock RN Entered By: Rhae Hammock on 03/23/2022 09:28:28 -------------------------------------------------------------------------------- Encounter Discharge Information Details Patient Name: Date of Service: Todd Parrish, Todd Adria Dan. 03/23/2022 8:00 A M Medical Record Number: [id number] Patient Account Number: [id number] Date of Birth/Sex: Treating RN: 06/09/1981 (41 y.o. Male) Rhae Hammock Primary Care Kanyah Matsushima: PA Haig Prophet, NO Other Clinician: Referring Zoeya Gramajo: Treating [place] Weekly/Extender: Yaakov Guthrie in Treatment: 0 Encounter Discharge Information Items Discharge Condition: Stable Ambulatory Status: Ambulatory Discharge Destination: Home Transportation: Private Auto Accompanied By: SELf Schedule Follow-up Appointment: Yes Clinical Summary of Care: Patient Declined Electronic Signature(s) Signed: 03/23/2022 3:48:01 PM By: Rhae Hammock RN Entered By: Rhae Hammock on 03/23/2022 09:29:48 -------------------------------------------------------------------------------- Lower Extremity Assessment Details Patient Name: Date of Service: Todd Parrish, Sheran Spine. 03/23/2022 8:00 A M Medical Record Number: [id number] Patient Account Number: [id number] Date of Birth/Sex: Treating RN: 01-Jul-1981 (40 y.o. Male) Rhae Hammock Primary Care Norell Brisbin: PA Haig Prophet, NO Other Clinician: Referring Abbi Mancini: Treating [place] Millon/Extender: Yaakov Guthrie in Treatment: 0 Edema Assessment Assessed: [Left: No] [Right: Yes] Edema: [Left: Ye] [Right: s] Calf Left: Right: Point of Measurement: From Medial Instep 75 cm Ankle Left: Right: Point of Measurement: From Medial Instep 35 cm Vascular Assessment Pulses: Dorsalis Pedis Palpable: [Right:Yes] Posterior Tibial Palpable:  [Right:Yes] Electronic Signature(s) Signed: 03/23/2022 3:48:01 PM By: Rhae Hammock RN Entered By: Rhae Hammock on 03/23/2022 08:48:21 -------------------------------------------------------------------------------- Multi Wound Chart Details Patient Name: Date of Service: Todd Parrish, Todd Parrish. 03/23/2022 8:00 A M Medical Record Number: [id number] Patient Account Number: [id number] Date of Birth/Sex: Treating RN: 04/14/1981 (40 y.o. Male) Rhae Hammock Primary Care Kimorah Ridolfi: PA Haig Prophet, NO Other Clinician: Referring Audryna Wendt: Treating Yehudit Fulginiti/Extender: Yaakov Guthrie in Treatment: 0 Vital Signs Height(in): 67 Pulse(bpm):  79 Weight(lbs): 360 Blood Pressure(mmHg): 160/82 Body Mass Index(BMI): 56.4 Temperature(F): 97.9 Respiratory Rate(breaths/min): 17 Photos: [4:Right, Anterior Lower Leg] [5:Left, Lateral Lower Leg] [N/A:N/A N/A] Wound Location: [4:Gradually Appeared] [5:Gradually Appeared] [N/A:N/A] Wounding Event: [4:Lymphedema] [5:Lymphedema] [N/A:N/A] Primary Etiology: [4:Human Immunodeficiency Virus,] [5:Human Immunodeficiency Virus,] [N/A:N/A] Comorbid History: [4:Lymphedema, Deep Vein Thrombosis, Hypertension, Confinement Anxiety 03/16/2022] [5:Lymphedema, Deep Vein Thrombosis, Hypertension, Confinement Anxiety 03/16/2022] [N/A:N/A] Date Acquired: [4:0] [5:0] [N/A:N/A] Weeks of Treatment: [4:Open] [5:Open] [N/A:N/A] Wound Status: [4:No] [5:No] [N/A:N/A] Wound Recurrence: [4:1.7x2x1] [5:4.5x5x5] [N/A:N/A] Measurements L x W x D (cm) [4:2.67] [5:17.671] [N/A:N/A] A (cm) : rea [4:2.67] [5:88.357] [N/A:N/A] Volume (cm) : [4:0.00%] [5:0.00%] [N/A:N/A] % Reduction in A rea: [4:0.00%] [5:0.00%] [N/A:N/A] % Reduction in Volume: [4:11] Position 1 (o'clock): [4:4.5] Maximum Distance 1 (cm): [4:12] Starting Position 1 (o'clock): [4:12] Ending Position 1 (o'clock): [4:1] Maximum Distance 1 (cm): [4:Yes] [5:No] [N/A:N/A] Tunneling: [4:Yes] [5:No]  [N/A:N/A] Undermining: [4:Unclassifiable] [5:Unclassifiable] [N/A:N/A] Classification: [4:Medium] [5:Large] [N/A:N/A] Exudate A mount: [4:Serosanguineous] [5:Purulent] [N/A:N/A] Exudate Type: [4:red, brown] [5:yellow, brown, green] [N/A:N/A] Exudate Color: [4:Distinct, outline attached] [5:Distinct, outline attached] [N/A:N/A] Wound Margin: [4:None Present (0%)] [5:None Present (0%)] [N/A:N/A] Granulation A mount: [4:Large (67-100%)] [5:Large (67-100%)] [N/A:N/A] Necrotic A mount: [4:Eschar, Adherent Slough] [5:Eschar, Adherent Slough] [N/A:N/A] Necrotic Tissue: [4:Fascia: No] [5:Fascia: No] [N/A:N/A] Exposed Structures: [4:Fat Layer (Subcutaneous Tissue): No Tendon: No Muscle: No Joint: No Bone: No None] [5:Fat Layer (Subcutaneous Tissue): No Tendon: No Muscle: No Joint: No Bone: No None] [N/A:N/A] Treatment Notes Wound #4 (Lower Leg) Wound Laterality: Right, Anterior Cleanser Soap and Water Discharge Instruction: May shower and wash wound with dial antibacterial soap and water prior to dressing change. Wound Cleanser Discharge Instruction: Cleanse the wound with wound cleanser prior to applying a clean dressing using gauze sponges, not tissue or cotton balls. Peri-Wound Care Topical Primary Dressing Dakin's Solution 0.25%, 16 (oz) Discharge Instruction: Moisten gauze with Dakin's solution Secondary Dressing Woven Gauze Sponge, Non-Sterile 4x4 in Discharge Instruction: Apply over primary dressing as directed. Zetuvit Plus 4x8 in Discharge Instruction: Apply over primary dressing as directed. Secured With [employer], 4.5x3.1 (in/yd) Discharge Instruction: Secure with Kerlix as directed. 45M Medipore H Soft Cloth Surgical T ape, 4 x 10 (in/yd) Discharge Instruction: Secure with tape as directed. Compression Wrap Compression Stockings Add-Ons Wound #5 (Lower Leg) Wound Laterality: Left, Lateral Cleanser Soap and Water Discharge Instruction: May shower and wash wound  with dial antibacterial soap and water prior to dressing change. Wound Cleanser Discharge Instruction: Cleanse the wound with wound cleanser prior to applying a clean dressing using gauze sponges, not tissue or cotton balls. Peri-Wound Care Topical Primary Dressing Dakin's Solution 0.25%, 16 (oz) Discharge Instruction: Moisten gauze with Dakin's solution Secondary Dressing Woven Gauze Sponge, Non-Sterile 4x4 in Discharge Instruction: Apply over primary dressing as directed. Zetuvit Plus 4x8 in Discharge Instruction: Apply over primary dressing as directed. Secured With [employer], 4.5x3.1 (in/yd) Discharge Instruction: Secure with Kerlix as directed. 45M Medipore H Soft Cloth Surgical T ape, 4 x 10 (in/yd) Discharge Instruction: Secure with tape as directed. Compression Wrap Compression Stockings Add-Ons Electronic Signature(s) Signed: 03/23/2022 11:17:55 AM By: Kalman Shan DO Signed: 03/23/2022 3:48:01 PM By: Rhae Hammock RN Entered By: Kalman Shan on 03/23/2022 10:02:29 -------------------------------------------------------------------------------- Multi-Disciplinary Care Plan Details Patient Name: Date of Service: Todd Parrish, Todd Adria Angel. 03/23/2022 8:00 A M Medical Record Number: [id number] Patient Account Number: [id number] Date of Birth/Sex: Treating RN: 08/27/1981 (40 y.o. Male) Rhae Hammock Primary Care Lauraine Crespo: PA Haig Prophet, NO Other Clinician: Referring Jhonnie Aliano: Treating  Chelle Cayton/Extender: Kalman Shan Weeks in Treatment: 0 Active Inactive Electronic Signature(s) Signed: 05/11/2022 3:58:07 PM By: Rhae Hammock RN Previous Signature: 03/23/2022 3:48:01 PM Version By: Rhae Hammock RN Entered By: Rhae Hammock on 05/01/2022 14:48:38 -------------------------------------------------------------------------------- Pain Assessment Details Patient Name: Date of Service: Todd Parrish, Todd Adria Kase. 03/23/2022 8:00 A M Medical Record Number:  [id number] Patient Account Number: [id number] Date of Birth/Sex: Treating RN: 1981/02/24 (40 y.o. Male) Rhae Hammock Primary Care Carizma Dunsworth: PA Haig Prophet, NO Other Clinician: Referring Lamija Besse: Treating Quaran Kedzierski/Extender: Yaakov Guthrie in Treatment: 0 Active Problems Location of Pain Severity and Description of Pain Patient Has Paino Yes Site Locations Pain Location: Pain in Ulcers With Dressing Change: Yes Duration of the Pain. Constant / Intermittento Intermittent Rate the pain. Current Pain Level: 2 Worst Pain Level: 10 Least Pain Level: 0 Tolerable Pain Level: 2 Character of Pain Describe the Pain: Aching Pain Management and Medication Current Pain Management: Medication: No Cold Application: No Rest: No Massage: No Activity: No T.E.N.S.: No Heat Application: No Leg drop or elevation: No Is the Current Pain Management Adequate: Adequate How does your wound impact your activities of daily livingo Sleep: No Bathing: No Appetite: No Relationship With Others: No Bladder Continence: No Emotions: No Bowel Continence: No Work: No Toileting: No Drive: No Dressing: No Hobbies: No Electronic Signature(s) Signed: 03/23/2022 3:48:01 PM By: Rhae Hammock RN Entered By: Rhae Hammock on 03/23/2022 08:44:26 -------------------------------------------------------------------------------- Patient/Caregiver Education Details Patient Name: Date of Service: Todd Parrish 4/3/2023andnbsp8:00 A M Medical Record Number: [id number] Patient Account Number: [id number] Date of Birth/Gender: Treating RN: 1981/10/05 (40 y.o. Male) Rhae Hammock Primary Care Physician: PA Haig Prophet, NO Other Clinician: Referring Physician: Treating Physician/Extender: Yaakov Guthrie in Treatment: 0 Education Assessment Education Provided To: Patient Education Topics Provided Welcome T [place]: o Methods: Explain/Verbal Responses: Reinforcements  needed, State content correctly Electronic Signature(s) Signed: 03/23/2022 3:48:01 PM By: Rhae Hammock RN Entered By: Rhae Hammock on 03/23/2022 09:04:38 -------------------------------------------------------------------------------- Wound Assessment Details Patient Name: Date of Service: Todd Parrish, Todd Broad J. 03/23/2022 8:00 A M Medical Record Number: [id number] Patient Account Number: [id number] Date of Birth/Sex: Treating RN: 19-Oct-1981 (40 y.o. Male) Rhae Hammock Primary Care Desere Gwin: PA Haig Prophet, NO Other Clinician: Referring Maleeyah Mccaughey: Treating Tyress Loden/Extender: Yaakov Guthrie in Treatment: 0 Wound Status Wound Number: 4 Primary Lymphedema Etiology: Wound Location: Right, Anterior Lower Leg Wound Open Wounding Event: Gradually Appeared Status: Date Acquired: 03/16/2022 Comorbid Human Immunodeficiency Virus, Lymphedema, Deep Vein Weeks Of Treatment: 0 History: Thrombosis, Hypertension, Confinement Anxiety Clustered Wound: No Photos Wound Measurements Length: (cm) 1.7 Width: (cm) 2 Depth: (cm) 1 Area: (cm) 2.67 Volume: (cm) 2.67 % Reduction in Area: 0% % Reduction in Volume: 0% Epithelialization: None Tunneling: Yes Position (o'clock): 11 Maximum Distance: (cm) 4.5 Undermining: Yes Starting Position (o'clock): 12 Ending Position (o'clock): 12 Maximum Distance: (cm) 1 Wound Description Classification: Unclassifiable Wound Margin: Distinct, outline attached Exudate Amount: Medium Exudate Type: Serosanguineous Exudate Color: red, brown Foul Odor After Cleansing: No Slough/Fibrino Yes Wound Bed Granulation Amount: None Present (0%) Exposed Structure Necrotic Amount: Large (67-100%) Fascia Exposed: No Necrotic Quality: Eschar, Adherent Slough Fat Layer (Subcutaneous Tissue) Exposed: No Tendon Exposed: No Muscle Exposed: No Joint Exposed: No Bone Exposed: No Electronic Signature(s) Signed: 03/23/2022 3:48:01 PM By: Rhae Hammock  RN Entered By: Rhae Hammock on 03/23/2022 09:00:09 -------------------------------------------------------------------------------- Wound Assessment Details Patient Name: Date of Service: Todd Parrish, Todd Rollene Rotunda J. 03/23/2022 8:00 A M Medical Record Number: [id number] Patient Account Number:  [id number] Date of Birth/Sex: Treating RN: 09-15-1981 (41 y.o. Male) Rhae Hammock Primary Care Cleta Heatley: PA Haig Prophet, NO Other Clinician: Referring Sahana Boyland: Treating Labresha Mellor/Extender: Yaakov Guthrie in Treatment: 0 Wound Status Wound Number: 5 Primary Lymphedema Etiology: Wound Location: Left, Lateral Lower Leg Wound Open Wounding Event: Gradually Appeared Status: Date Acquired: 03/16/2022 Comorbid Human Immunodeficiency Virus, Lymphedema, Deep Vein Weeks Of Treatment: 0 History: Thrombosis, Hypertension, Confinement Anxiety Clustered Wound: No Photos Wound Measurements Length: (cm) 4.5 Width: (cm) 5 Depth: (cm) 5 Area: (cm) 17.671 Volume: (cm) 88.357 % Reduction in Area: 0% % Reduction in Volume: 0% Epithelialization: None Tunneling: No Undermining: No Wound Description Classification: Unclassifiable Wound Margin: Distinct, outline attached Exudate Amount: Large Exudate Type: Purulent Exudate Color: yellow, brown, green Wound Bed Granulation Amount: None Present (0%) Necrotic Amount: Large (67-100%) Necrotic Quality: Eschar, Adherent Slough Foul Odor After Cleansing: No Slough/Fibrino Yes Exposed Structure Fascia Exposed: No Fat Layer (Subcutaneous Tissue) Exposed: No Tendon Exposed: No Muscle Exposed: No Joint Exposed: No Bone Exposed: No Electronic Signature(s) Signed: 03/23/2022 3:48:01 PM By: Rhae Hammock RN Entered By: Rhae Hammock on 03/23/2022 09:00:42 -------------------------------------------------------------------------------- [place] Details Patient Name: Date of Service: Todd Parrish, Todd Parrish. 03/23/2022 8:00 A M Medical Record Number:  [id number] Patient Account Number: [id number] Date of Birth/Sex: Treating RN: 07/18/1981 (41 y.o. Male) Rhae Hammock Primary Care Camellia Popescu: PA Haig Prophet, NO Other Clinician: Referring Olufemi Mofield: Treating Mclane Arora/Extender: Yaakov Guthrie in Treatment: 0 Vital Signs Time Taken: 08:41 Temperature (F): 97.9 Height (in): 67 Pulse (bpm): 63 Source: Stated Respiratory Rate (breaths/min): 17 Weight (lbs): 360 Blood Pressure (mmHg): 160/82 Source: Stated Reference Range: 80 - 120 mg / dl Body Mass Index (BMI): 56.4 Electronic Signature(s) Signed: 03/23/2022 3:48:01 PM By: Rhae Hammock RN Entered By: Rhae Hammock on 03/23/2022 08:42:31

## 2022-03-21 DIAGNOSIS — Z419 Encounter for procedure for purposes other than remedying health state, unspecified: Secondary | ICD-10-CM | POA: Diagnosis not present

## 2022-03-23 ENCOUNTER — Other Ambulatory Visit: Payer: Self-pay

## 2022-03-23 ENCOUNTER — Other Ambulatory Visit (HOSPITAL_COMMUNITY): Payer: Self-pay

## 2022-03-23 ENCOUNTER — Encounter (HOSPITAL_BASED_OUTPATIENT_CLINIC_OR_DEPARTMENT_OTHER): Payer: Medicaid Other | Attending: Internal Medicine | Admitting: Internal Medicine

## 2022-03-23 DIAGNOSIS — Z86711 Personal history of pulmonary embolism: Secondary | ICD-10-CM | POA: Diagnosis not present

## 2022-03-23 DIAGNOSIS — Z95828 Presence of other vascular implants and grafts: Secondary | ICD-10-CM | POA: Insufficient documentation

## 2022-03-23 DIAGNOSIS — L97912 Non-pressure chronic ulcer of unspecified part of right lower leg with fat layer exposed: Secondary | ICD-10-CM | POA: Insufficient documentation

## 2022-03-23 DIAGNOSIS — I890 Lymphedema, not elsewhere classified: Secondary | ICD-10-CM | POA: Diagnosis not present

## 2022-03-23 DIAGNOSIS — B20 Human immunodeficiency virus [HIV] disease: Secondary | ICD-10-CM | POA: Diagnosis not present

## 2022-03-23 MED ORDER — AMOXICILLIN-POT CLAVULANATE 875-125 MG PO TABS
ORAL_TABLET | ORAL | 0 refills | Status: DC
  Filled 2022-03-23: qty 20, 10d supply, fill #0

## 2022-03-23 MED ORDER — DOXYCYCLINE HYCLATE 100 MG PO TABS
ORAL_TABLET | ORAL | 0 refills | Status: DC
  Filled 2022-03-23: qty 20, 10d supply, fill #0

## 2022-03-23 MED ORDER — DAKINS (1/4 STRENGTH) 0.125 % EX SOLN: CUTANEOUS | 1 refills | Status: DC

## 2022-03-23 MED ORDER — DAKINS (1/4 STRENGTH) 0.125 % EX SOLN
CUTANEOUS | 0 refills | Status: DC
  Filled 2022-03-23: qty 473, 90d supply, fill #0
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
## 2022-03-23 NOTE — Progress Notes (Signed)
ARSLAN, KIER ([id number]) ?Visit Report for 03/23/2022 ?Abuse Risk Screen Details ?Patient Name: Date of Service: ?Todd Parrish, Todd O N J. 03/23/2022 8:00 A M ?Medical Record Number: [id number] ?Patient Account Number: [id number] ?Date of Birth/Sex: Treating RN: ?10-16-1981 (41 y.o. Male) Fonnie Mu ?Primary Care Yanin Muhlestein: PA TIENT, NO Other Clinician: ?Referring Feliz Lincoln: ?Treating Mila Pair/Extender: Geralyn Corwin ?Weeks in Treatment: 0 ?Abuse Risk Screen Items ?Answer ?ABUSE RISK SCREEN: ?Has anyone close to you tried to hurt or harm you recentlyo No ?Do you feel uncomfortable with anyone in your familyo No ?Has anyone forced you do things that you didnt want to doo No ?Electronic Signature(s) ?Signed: 03/23/2022 3:48:01 PM By: Fonnie Mu RN ?Entered By: Fonnie Mu on 03/23/2022 08:43:11 ?-------------------------------------------------------------------------------- ?Activities of Daily Living Details ?Patient Name: Date of Service: ?Todd Parrish, Todd O N J. 03/23/2022 8:00 A M ?Medical Record Number: [id number] ?Patient Account Number: [id number] ?Date of Birth/Sex: Treating RN: ?[birth year] (41 y.o. Male) Fonnie Mu ?Primary Care Natisha Trzcinski: PA TIENT, NO Other Clinician: ?Referring Randye Treichler: ?Treating Jarah Pember/Extender: Geralyn Corwin ?Weeks in Treatment: 0 ?Activities of Daily Living Items ?Answer ?Activities of Daily Living (Please select one for each item) ?Drive Automobile Completely Able ?T Medications ?ake Completely Able ?Use T elephone Completely Able ?Care for Appearance Completely Able ?Use T oilet Completely Able ?Bath / Shower Completely Able ?Dress Self Completely Able ?Feed Self Completely Able ?Walk Completely Able ?Get In / Out Bed Completely Able ?Housework Completely Able ?Prepare Meals Completely Able ?Handle Money Completely Able ?Shop for Self Completely Able ?Electronic Signature(s) ?Signed: 03/23/2022 3:48:01 PM By: Fonnie Mu RN ?Entered By: Fonnie Mu on  03/23/2022 08:43:30 ?-------------------------------------------------------------------------------- ?Education Screening Details ?Patient Name: ?Date of Service: ?Todd Parrish, Todd O N J. 03/23/2022 8:00 A M ?Medical Record Number: [id number] ?Patient Account Number: [id number] ?Date of Birth/Sex: ?Treating RN: ?[birth year] (41 y.o. Male) Fonnie Mu ?Primary Care Delta Deshmukh: PA TIENT, NO ?Other Clinician: ?Referring Blaise Palladino: ?Treating Azhar Knope/Extender: Geralyn Corwin ?Weeks in Treatment: 0 ?Primary Learner Assessed: Patient ?Learning Preferences/Education Level/Primary Language ?Learning Preference: Explanation, Demonstration, Communication Board, Printed Material ?Highest Education Level: College or Above ?Preferred Language: English ?Cognitive Barrier ?Language Barrier: No ?Translator Needed: No ?Memory Deficit: No ?Emotional Barrier: No ?Cultural/Religious Beliefs Affecting Medical Care: No ?Physical Barrier ?Impaired Vision: No ?Impaired Hearing: No ?Decreased Hand dexterity: No ?Knowledge/Comprehension ?Knowledge Level: High ?Comprehension Level: High ?Ability to understand written instructions: High ?Ability to understand verbal instructions: High ?Motivation ?Anxiety Level: Calm ?Cooperation: Cooperative ?Education Importance: Denies Need ?Interest in Health Problems: Asks Questions ?Perception: Coherent ?Willingness to Engage in Self-Management High ?Activities: ?Readiness to Engage in Self-Management High ?Activities: ?Electronic Signature(s) ?Signed: 03/23/2022 3:48:01 PM By: Fonnie Mu RN ?Entered By: Fonnie Mu on 03/23/2022 08:43:51 ?-------------------------------------------------------------------------------- ?Fall Risk Assessment Details ?Patient Name: ?Date of Service: ?Todd Parrish, Todd O N J. 03/23/2022 8:00 A M ?Medical Record Number: [id number] ?Patient Account Number: [id number] ?Date of Birth/Sex: ?Treating RN: ?Feb 16, 1981 (41 y.o. Male) Fonnie Mu ?Primary Care Adaysha Dubinsky: PA  TIENT, NO ?Other Clinician: ?Referring Domnique Vanegas: ?Treating Cela Newcom/Extender: Geralyn Corwin ?Weeks in Treatment: 0 ?Fall Risk Assessment Items ?Have you had 2 or more falls in the last 12 monthso 0 No ?Have you had any fall that resulted in injury in the last 12 monthso 0 No ?FALLS RISK SCREEN ?History of falling - immediate or within 3 months 0 No ?Secondary diagnosis (Do you have 2 or more medical diagnoseso) 0 No ?Ambulatory aid ?None/bed rest/wheelchair/nurse 0 No ?Crutches/cane/walker 0 No ?Furniture 0 No ?Intravenous therapy Access/Saline/Heparin Lock 0 No ?Gait/Transferring ?Normal/ bed  rest/ wheelchair 0 No ?Weak (short steps with or without shuffle, stooped but able to lift head while walking, may seek 0 No ?support from furniture) ?Impaired (short steps with shuffle, may have difficulty arising from chair, head down, impaired 0 No ?balance) ?Mental Status ?Oriented to own ability 0 No ?Electronic Signature(s) ?Signed: 03/23/2022 3:48:01 PM By: Fonnie Mu RN ?Entered By: Fonnie Mu on 03/23/2022 08:43:56 ?-------------------------------------------------------------------------------- ?Foot Assessment Details ?Patient Name: ?Date of Service: ?Todd Parrish, Todd O N J. 03/23/2022 8:00 A M ?Medical Record Number: [id number] ?Patient Account Number: [id number] ?Date of Birth/Sex: ?Treating RN: ?1981/05/27 (41 y.o. Male) Fonnie Mu ?Primary Care Jesseca Marsch: PA TIENT, NO ?Other Clinician: ?Referring Faraaz Wolin: ?Treating Ebunoluwa Gernert/Extender: Geralyn Corwin ?Weeks in Treatment: 0 ?Foot Assessment Items ?Site Locations ?+ = Sensation present, - = Sensation absent, C = Callus, U = Ulcer ?R = Redness, W = Warmth, M = Maceration, PU = Pre-ulcerative lesion ?F = Fissure, S = Swelling, D = Dryness ?Assessment ?Right: Left: ?Other Deformity: No No ?Prior Foot Ulcer: No No ?Prior Amputation: No No ?Charcot Joint: No No ?Ambulatory Status: Ambulatory Without Help ?Gait: Steady ?Electronic Signature(s) ?Signed:  03/23/2022 3:48:01 PM By: Fonnie Mu RN ?Entered By: Fonnie Mu on 03/23/2022 08:44:57 ?-------------------------------------------------------------------------------- ?Nutrition Risk Screening Details ?Patient Name: ?Date of Service: ?Todd Parrish, Todd O N J. 03/23/2022 8:00 A M ?Medical Record Number: [id number] ?Patient Account Number: [id number] ?Date of Birth/Sex: ?Treating RN: ?26-Apr-1981 (41 y.o. Male) Fonnie Mu ?Primary Care Keltin Baird: PA TIENT, NO ?Other Clinician: ?Referring Merit Maybee: ?Treating Francely Craw/Extender: Geralyn Corwin ?Weeks in Treatment: 0 ?Height (in): 67 ?Weight (lbs): 360 ?Body Mass Index (BMI): 56.4 ?Nutrition Risk Screening Items ?Score Screening ?NUTRITION RISK SCREEN: ?I have an illness or condition that made me change the kind and/or amount of food I eat 0 No ?I eat fewer than two meals per day 0 No ?I eat few fruits and vegetables, or milk products 0 No ?I have three or more drinks of beer, liquor or wine almost every day 0 No ?I have tooth or mouth problems that make it hard for me to eat 0 No ?I don't always have enough money to buy the food I need 0 No ?I eat alone most of the time 0 No ?I take three or more different prescribed or over-the-counter drugs a day 0 No ?Without wanting to, I have lost or gained 10 pounds in the last six months 0 No ?I am not always physically able to shop, cook and/or feed myself 0 No ?Nutrition Protocols ?Good Risk Protocol 0 No interventions needed ?Moderate Risk Protocol ?High Risk Proctocol ?Risk Level: Good Risk ?Score: 0 ?Electronic Signature(s) ?Signed: 03/23/2022 3:48:01 PM By: Fonnie Mu RN ?Entered By: Fonnie Mu on 03/23/2022 08:44:02 ?

## 2022-03-24 ENCOUNTER — Other Ambulatory Visit (HOSPITAL_COMMUNITY): Payer: Self-pay

## 2022-03-30 ENCOUNTER — Encounter (HOSPITAL_BASED_OUTPATIENT_CLINIC_OR_DEPARTMENT_OTHER): Payer: Medicaid Other | Admitting: Internal Medicine

## 2022-04-08 ENCOUNTER — Other Ambulatory Visit (HOSPITAL_COMMUNITY): Payer: Self-pay

## 2022-04-15 ENCOUNTER — Other Ambulatory Visit (HOSPITAL_COMMUNITY): Payer: Self-pay

## 2022-04-20 ENCOUNTER — Other Ambulatory Visit (HOSPITAL_COMMUNITY): Payer: Self-pay

## 2022-04-20 DIAGNOSIS — Z419 Encounter for procedure for purposes other than remedying health state, unspecified: Secondary | ICD-10-CM | POA: Diagnosis not present

## 2022-04-24 ENCOUNTER — Other Ambulatory Visit (HOSPITAL_COMMUNITY): Payer: Self-pay

## 2022-05-04 ENCOUNTER — Other Ambulatory Visit (HOSPITAL_COMMUNITY): Payer: Self-pay

## 2022-05-04 ENCOUNTER — Encounter (HOSPITAL_BASED_OUTPATIENT_CLINIC_OR_DEPARTMENT_OTHER): Payer: Medicaid Other | Admitting: Internal Medicine

## 2022-05-12 ENCOUNTER — Encounter (HOSPITAL_BASED_OUTPATIENT_CLINIC_OR_DEPARTMENT_OTHER): Payer: Self-pay | Admitting: Emergency Medicine

## 2022-05-12 ENCOUNTER — Emergency Department (HOSPITAL_BASED_OUTPATIENT_CLINIC_OR_DEPARTMENT_OTHER)
Admission: EM | Admit: 2022-05-12 | Discharge: 2022-05-12 | Disposition: A | Discharge status: Home / Self Care | Payer: Medicaid Other | Attending: Emergency Medicine | Admitting: Emergency Medicine

## 2022-05-12 ENCOUNTER — Other Ambulatory Visit: Payer: Self-pay

## 2022-05-12 DIAGNOSIS — L03115 Cellulitis of right lower limb: Secondary | ICD-10-CM | POA: Insufficient documentation

## 2022-05-12 DIAGNOSIS — M7989 Other specified soft tissue disorders: Secondary | ICD-10-CM | POA: Diagnosis present

## 2022-05-12 DIAGNOSIS — I890 Lymphedema, not elsewhere classified: Secondary | ICD-10-CM | POA: Insufficient documentation

## 2022-05-12 DIAGNOSIS — R030 Elevated blood-pressure reading, without diagnosis of hypertension: Secondary | ICD-10-CM | POA: Insufficient documentation

## 2022-05-12 MED ORDER — VANCOMYCIN HCL IN DEXTROSE 1-5 GM/200ML-% IV SOLN
1000.0000 mg | Freq: Once | INTRAVENOUS | Status: AC
  Administered 2022-05-12: 1000 mg via INTRAVENOUS
  Filled 2022-05-12: qty 200

## 2022-05-12 MED ORDER — SODIUM CHLORIDE 0.9 % IV SOLN
2.0000 g | Freq: Once | INTRAVENOUS | Status: AC
  Administered 2022-05-12: 2 g via INTRAVENOUS
  Filled 2022-05-12: qty 20

## 2022-05-12 MED ORDER — AMOXICILLIN-POT CLAVULANATE 875-125 MG PO TABS: 1.0000 | ORAL_TABLET | Freq: Two times a day (BID) | ORAL | 0 refills | Status: DC

## 2022-05-12 MED ORDER — VANCOMYCIN HCL 1500 MG/300ML IV SOLN: 1500.0000 mg | Freq: Once | INTRAVENOUS | Status: DC

## 2022-05-12 MED ORDER — DOXYCYCLINE HYCLATE 100 MG PO CAPS: 100.0000 mg | ORAL_CAPSULE | Freq: Two times a day (BID) | ORAL | 0 refills | Status: DC

## 2022-05-12 NOTE — ED Provider Notes (Signed)
[facility] EMERGENCY DEPT Provider Note   CSN: [id number] Arrival date & time: 05/12/22  1217     History  Chief Complaint  Patient presents with   Leg Pain    Todd Parrish is a 41 y.o. male.  Pt with hx recurrent cellulitis RLE presents w similar symptoms. States in past few days, right lower leg with erythema, increased warmth, mild redness.  Denies fever or chills. States overall does not feel sick or ill - normal appetite. No nausea/vomiting. No body aches. States leg is chronically swollen - hx lymphedema - no acute/abrupt worsening. No severe leg pain. No chest pain or sob.   The history is provided by the patient and medical records.  Leg Pain Associated symptoms: no fever       Home Medications Prior to Admission medications   Medication Sig Start Date End Date Taking? Authorizing Provider  acetaminophen (TYLENOL) 500 MG tablet Take 1,000 mg by mouth every 6 (six) hours as needed for headache (pain).    [provider]  amoxicillin-clavulanate (AUGMENTIN) 875-125 MG tablet Take 1 tablet by mouth twice daily for 10 days. 03/23/22     bictegravir-emtricitabine-tenofovir AF (BIKTARVY) 50-200-25 MG TABS tablet TAKE 1 TABLET BY MOUTH DAILY. 05/28/21   Ginnie Smart, MD  doxycycline (VIBRA-TABS) 100 MG tablet Take 1 tablet by mouth 2 times a day for 10 days 03/23/22     ibuprofen (ADVIL) 200 MG tablet Take 800 mg by mouth every 6 (six) hours as needed (pain/headache).    [provider]  lidocaine (XYLOCAINE) 2 % solution Use as directed 15 mLs in the mouth or throat as needed for mouth pain. 09/28/21   Rhys Martini, PA-C  sodium hypochlorite (DAKIN'S 1/4 STRENGTH) 0.125 % SOLN apply to affected areas daily. 03/23/22     sodium hypochlorite (DAKIN'S 1/4 STRENGTH) 0.125 % SOLN Apply to affected areas daily as directed. 03/23/22         Allergies    Morphine and related, Pork-derived products, and Shellfish allergy    Review of Systems    Review of Systems  Constitutional:  Negative for chills and fever.  Respiratory:  Negative for shortness of breath.   Cardiovascular:  Negative for chest pain.  Gastrointestinal:  Negative for nausea and vomiting.  Musculoskeletal:        See hpi  Skin:  Negative for rash.  Neurological:  Negative for weakness and numbness.   Physical Exam Updated Vital Signs BP (!) 158/91 (BP Location: Right Arm)   Pulse 96   Temp 98.5 F (36.9 C)   Resp 20   Ht 1.702 m (5\' 7" )   Wt (!) 147.4 kg   SpO2 99%   BMI 50.90 kg/m  Physical Exam Vitals and nursing note reviewed.  Constitutional:      Appearance: Normal appearance. He is well-developed.  HENT:     Head: Atraumatic.     Nose: Nose normal.     Mouth/Throat:     Mouth: Mucous membranes are moist.  Eyes:     General: No scleral icterus.    Conjunctiva/sclera: Conjunctivae normal.  Neck:     Trachea: No tracheal deviation.  Cardiovascular:     Rate and Rhythm: Normal rate and regular rhythm.     Pulses: Normal pulses.     Heart sounds: Normal heart sounds. No murmur heard.   No friction rub. No gallop.  Pulmonary:     Effort: Pulmonary effort is normal. No accessory muscle usage  or respiratory distress.     Breath sounds: Normal breath sounds.  Abdominal:     General: There is no distension.     Tenderness: There is no abdominal tenderness.  Genitourinary:    Comments: No cva tenderness. Musculoskeletal:     Cervical back: Neck supple.     Comments: Chronic lymphedema legs, R > L (pt indicates baseline for him). Right lower leg with mild erythema and increased warmth. No crepitus. No devitalized or necrotic tissues. No abscess/fluctuance. Distal pulse palp, normal cap refill in foot.   Skin:    General: Skin is warm and dry.     Findings: No rash.  Neurological:     Mental Status: He is alert.     Comments: Alert, speech clear. RLE motor/sens grossly intact.   Psychiatric:        Mood and Affect: Mood normal.    ED  Results / Procedures / Treatments   Labs (all labs ordered are listed, but only abnormal results are displayed) Labs Reviewed - No data to display  EKG None  Radiology No results found.  Procedures Procedures    Medications Ordered in ED Medications  cefTRIAXone (ROCEPHIN) 2 g in sodium chloride 0.9 % 100 mL IVPB (has no administration in time range)  vancomycin (VANCOREADY) IVPB 1500 mg/300 mL (has no administration in time range)    ED Course/ Medical Decision Making/ A&P                           Medical Decision Making Problems Addressed: Cellulitis of right lower leg: acute illness or injury with systemic symptoms that poses a threat to life or bodily functions Elevated blood pressure reading: acute illness or injury Lymphedema: chronic illness or injury with exacerbation, progression, or side effects of treatment  Amount and/or Complexity of Data Reviewed External Data Reviewed: notes.  Risk Prescription drug management.   Iv ns.   Reviewed nursing notes and prior charts for additional history. Prior charts reviewed.   Rocephin iv. Vancomycin iv.   Rx for home, augmentin and doxy.  Recheck, pt comfortable, vitals stable. No pain. Overall pt is well, not toxic appearing.   Rec pcp f/u.  Return precautions provided.             Final Clinical Impression(s) / ED Diagnoses Final diagnoses:  None    Rx / DC Orders ED Discharge Orders     None         Cathren Laine, MD 05/12/22 1450

## 2022-05-12 NOTE — ED Triage Notes (Signed)
Pt from home c/o of pain to his right lower extremity for the past 3 days. Pt had a mechanical fall 7 days ago but does not think his leg pain is related to the fall.

## 2022-05-12 NOTE — Discharge Instructions (Addendum)
It was our pleasure to provide your ER care today - we hope that you feel better.  Elevate leg. Keep skin very clean.  Take antibiotics (augmentin + doxycycline) as prescribed.   Follow up with primary  care doctor in the coming week if symptoms fail to improve/resolve.  Return to [facility] ER if worse, new symptoms, spreading redness, increased swelling, severe pain, fevers, or other concern.

## 2022-05-13 ENCOUNTER — Telehealth: Payer: Self-pay

## 2022-05-13 NOTE — Telephone Encounter (Signed)
Transition Care Management Unsuccessful Follow-up Telephone Call  Date of discharge and from where:  05/12/2022-[facility]   Attempts:  1st Attempt  Reason for unsuccessful TCM follow-up call:  Unable to reach patient

## 2022-05-14 ENCOUNTER — Encounter: Payer: Self-pay | Admitting: Infectious Diseases

## 2022-05-14 ENCOUNTER — Other Ambulatory Visit (HOSPITAL_COMMUNITY): Payer: Self-pay

## 2022-05-14 NOTE — Telephone Encounter (Signed)
Transition Care Management Follow-up Telephone Call Date of discharge and from where:  05/12/2022 from [facility] How have you been since you were released from the hospital? Patient stated that he is feeling much better and did not have any questions or concerns at this time.  Any questions or concerns? No  Items Reviewed: Did the pt receive and understand the discharge instructions provided? Yes  Medications obtained and verified? Yes  Other? No  Any new allergies since your discharge? No  Dietary orders reviewed? No Do you have support at home? Yes   Functional Questionnaire: (I = Independent and D = Dependent) ADLs: I  Bathing/Dressing- I  Meal Prep- I  Eating- I  Maintaining continence- I  Transferring/Ambulation- I  Managing Meds- I   Follow up appointments reviewed:  PCP Hospital f/u appt confirmed? No  Patient stated he would establish with a PCP at a later time.  [facility] f/u appt confirmed? No   Are transportation arrangements needed? No  If their condition worsens, is the pt aware to call PCP or go to the Emergency Dept.? Yes Was the patient provided with contact information for the PCP's office or ED? Yes Was to pt encouraged to call back with questions or concerns? Yes

## 2022-05-15 ENCOUNTER — Other Ambulatory Visit (HOSPITAL_COMMUNITY): Payer: Self-pay

## 2022-05-19 ENCOUNTER — Other Ambulatory Visit: Payer: Self-pay

## 2022-05-19 ENCOUNTER — Encounter (HOSPITAL_BASED_OUTPATIENT_CLINIC_OR_DEPARTMENT_OTHER): Payer: Self-pay | Admitting: Obstetrics and Gynecology

## 2022-05-19 ENCOUNTER — Ambulatory Visit: Payer: Self-pay

## 2022-05-19 ENCOUNTER — Emergency Department (HOSPITAL_BASED_OUTPATIENT_CLINIC_OR_DEPARTMENT_OTHER)
Admission: EM | Admit: 2022-05-19 | Discharge: 2022-05-19 | Disposition: A | Discharge status: Home / Self Care | Payer: Medicaid Other | Attending: Emergency Medicine | Admitting: Emergency Medicine

## 2022-05-19 DIAGNOSIS — M25561 Pain in right knee: Secondary | ICD-10-CM | POA: Insufficient documentation

## 2022-05-19 DIAGNOSIS — Z21 Asymptomatic human immunodeficiency virus [HIV] infection status: Secondary | ICD-10-CM | POA: Diagnosis not present

## 2022-05-19 DIAGNOSIS — M79604 Pain in right leg: Secondary | ICD-10-CM | POA: Diagnosis not present

## 2022-05-19 LAB — BASIC METABOLIC PANEL
Anion gap: 11 (ref 5–15)
BUN: 11 mg/dL (ref 6–20)
CO2: 28 mmol/L (ref 22–32)
Calcium: 9.4 mg/dL (ref 8.9–10.3)
Chloride: 98 mmol/L (ref 98–111)
Creatinine, Ser: 0.78 mg/dL (ref 0.61–1.24)
GFR, Estimated: >60 mL/min (ref >60)
Glucose, Bld: 93 mg/dL (ref 70–99)
Potassium: 3.8 mmol/L (ref 3.5–5.1)
Sodium: 137 mmol/L (ref 135–145)

## 2022-05-19 LAB — CBC WITH DIFFERENTIAL/PLATELET
Abs Immature Granulocytes: 0.08 10*3/uL — ABNORMAL HIGH (ref 0.00–0.07)
Basophils Absolute: 0 10*3/uL (ref 0.0–0.1)
Basophils Relative: 0 %
Eosinophils Absolute: 0.2 10*3/uL (ref 0.0–0.5)
Eosinophils Relative: 1 %
HCT: 33.4 % — ABNORMAL LOW (ref 39.0–52.0)
Hemoglobin: 9.9 g/dL — ABNORMAL LOW (ref 13.0–17.0)
Immature Granulocytes: 1 %
Lymphocytes Relative: 14 %
Lymphs Abs: 1.9 10*3/uL (ref 0.7–4.0)
MCH: 23.6 pg — ABNORMAL LOW (ref 26.0–34.0)
MCHC: 29.6 g/dL — ABNORMAL LOW (ref 30.0–36.0)
MCV: 79.5 fL — ABNORMAL LOW (ref 80.0–100.0)
Monocytes Absolute: 1.1 10*3/uL — ABNORMAL HIGH (ref 0.1–1.0)
Monocytes Relative: 8 %
Neutro Abs: 9.9 10*3/uL — ABNORMAL HIGH (ref 1.7–7.7)
Neutrophils Relative %: 76 %
Platelets: 370 10*3/uL (ref 150–400)
RBC: 4.2 MIL/uL — ABNORMAL LOW (ref 4.22–5.81)
RDW: 14.2 % (ref 11.5–15.5)
WBC: 13.1 10*3/uL — ABNORMAL HIGH (ref 4.0–10.5)
nRBC: 0 % (ref 0.0–0.2)

## 2022-05-19 MED ORDER — AMOXICILLIN-POT CLAVULANATE 875-125 MG PO TABS: 1.0000 | ORAL_TABLET | Freq: Two times a day (BID) | ORAL | 0 refills | Status: DC

## 2022-05-19 MED ORDER — DOXYCYCLINE HYCLATE 100 MG PO CAPS: 100.0000 mg | ORAL_CAPSULE | Freq: Two times a day (BID) | ORAL | 0 refills | Status: DC

## 2022-05-19 NOTE — ED Notes (Signed)
Pt verbalizes understanding of discharge instructions. Opportunity for questioning and answers were provided. Pt discharged from ED to home.   ? ?

## 2022-05-19 NOTE — Telephone Encounter (Signed)
     Chief Complaint: Cellulitis right lower leg. Has one more day of antibiotics, "but my leg still looks bad, red swollen and painful." Symptoms: Pain, redness Frequency: 05/12/22 Pertinent Negatives: Patient denies fever Disposition: [x] ED /[] Urgent Care (no appt availability in office) / [] Appointment(In office/virtual)/ []  [facility]/ [] Home Care/ [] Refused Recommended Disposition /[]  Mobile Bus/ []  Follow-up with PCP Additional Notes:  Will go back to ED for treatment. Reason for Disposition  Patient sounds very sick or weak to the triager  Answer Assessment - Initial Assessment Questions 1. SYMPTOM: "What's the main symptom you're concerned about?" (e.g., redness, swelling, pain, fever, weakness)     Pain, red 2. CELLULITIS LOCATION: "Where is the cellulitis located?" (e.g., hand, arm, foot, leg, face)     Right lower leg 3. CELLULITIS SIZE: "What is the size of the red area?" (e.g., inches, centimeters; compare to size of a coin) .     Lower calf, hand size 4. BETTER-SAME-WORSE: "Are you getting better, staying the same, or getting worse compared to the day you started the antibiotics?"      Worse 5. PAIN: Do you have any pain?"  If Yes, ask: "How bad is the pain?"  (e.g., Scale 1-10; mild, moderate, or severe)    - MILD (1-3): doesn't interfere with normal activities     - MODERATE (4-7): interferes with normal activities or awakens from sleep    - SEVERE (8-10): excruciating pain, unable to do any normal activities       8-9 6. FEVER: "Do you have a fever?" If Yes, ask: "What is it, how was it measured and when did it start?"     No 7. OTHER SYMPTOMS: "Do you have any other symptoms?" (e.g., pus coming from a wound, red streaks, weakness)     Limping 8. DIAGNOSIS DATE: "When was the cellulitis diagnosed?" "By whom?"      05/12/22 9. ANTIBIOTIC NAME: "What antibiotic(s) are you taking?"  "How many times per day?" (Be sure the patient is receiving the  antibiotic as directed).      IV medication in ED 10. ANTIBIOTIC DATE: "When was the antibiotic started?"       05/12/22 11. FOLLOW-UP APPOINTMENT: "Do you have follow-up appointment with your doctor?"       No  Protocols used: Cellulitis on Antibiotic Follow-up Call-A-[facility]

## 2022-05-19 NOTE — ED Triage Notes (Signed)
Patient reports to the ER for cellulitis on the right leg. Patient reports this is recurrent. Pain 9/10.
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
## 2022-05-19 NOTE — Discharge Instructions (Signed)
I provided information packet regarding lymphedema.  Please read to help decrease symptoms.  Consider elevating your feet above your heart as often as possible.  Continue follow-up with wound care for improving wounds on your right leg.  Take antibiotics as directed over the next 7 days.  Please do not hesitate to return to emergency department if the worrisome signs and symptoms we discussed become apparent.

## 2022-05-20 ENCOUNTER — Telehealth: Payer: Self-pay

## 2022-05-20 NOTE — Telephone Encounter (Signed)
Transition Care Management Unsuccessful Follow-up Telephone Call  Date of discharge and from where:  05/19/2022[facility]  Attempts:  1st Attempt  Reason for unsuccessful TCM follow-up call:  Left voice message

## 2022-05-21 DIAGNOSIS — Z419 Encounter for procedure for purposes other than remedying health state, unspecified: Secondary | ICD-10-CM | POA: Diagnosis not present

## 2022-05-21 NOTE — Telephone Encounter (Signed)
Transition Care Management Unsuccessful Follow-up Telephone Call  Date of discharge and from where:  05/19/2022[facility]  Attempts:  2nd Attempt  Reason for unsuccessful TCM follow-up call:  Left voice message

## 2022-05-22 ENCOUNTER — Other Ambulatory Visit: Payer: Self-pay

## 2022-05-22 ENCOUNTER — Inpatient Hospital Stay (HOSPITAL_COMMUNITY): Payer: Medicaid Other

## 2022-05-22 ENCOUNTER — Encounter (HOSPITAL_COMMUNITY): Payer: Self-pay | Admitting: Internal Medicine

## 2022-05-22 ENCOUNTER — Observation Stay (HOSPITAL_COMMUNITY): Payer: Medicaid Other

## 2022-05-22 ENCOUNTER — Inpatient Hospital Stay (HOSPITAL_COMMUNITY)
Admission: EM | Admit: 2022-05-22 | Discharge: 2022-06-05 | DRG: 574 | Disposition: A | Discharge status: Home Health | Payer: Medicaid Other | Attending: Internal Medicine | Admitting: Internal Medicine

## 2022-05-22 DIAGNOSIS — L03115 Cellulitis of right lower limb: Secondary | ICD-10-CM | POA: Diagnosis not present

## 2022-05-22 DIAGNOSIS — E876 Hypokalemia: Secondary | ICD-10-CM | POA: Diagnosis not present

## 2022-05-22 DIAGNOSIS — I10 Essential (primary) hypertension: Secondary | ICD-10-CM | POA: Diagnosis present

## 2022-05-22 DIAGNOSIS — D509 Iron deficiency anemia, unspecified: Secondary | ICD-10-CM | POA: Diagnosis present

## 2022-05-22 DIAGNOSIS — M86661 Other chronic osteomyelitis, right tibia and fibula: Secondary | ICD-10-CM | POA: Diagnosis not present

## 2022-05-22 DIAGNOSIS — Z91013 Allergy to seafood: Secondary | ICD-10-CM

## 2022-05-22 DIAGNOSIS — B9562 Methicillin resistant Staphylococcus aureus infection as the cause of diseases classified elsewhere: Secondary | ICD-10-CM | POA: Diagnosis not present

## 2022-05-22 DIAGNOSIS — L02415 Cutaneous abscess of right lower limb: Principal | ICD-10-CM

## 2022-05-22 DIAGNOSIS — K219 Gastro-esophageal reflux disease without esophagitis: Secondary | ICD-10-CM | POA: Diagnosis not present

## 2022-05-22 DIAGNOSIS — M7989 Other specified soft tissue disorders: Secondary | ICD-10-CM | POA: Diagnosis not present

## 2022-05-22 DIAGNOSIS — R509 Fever, unspecified: Secondary | ICD-10-CM | POA: Diagnosis not present

## 2022-05-22 DIAGNOSIS — Z6841 Body Mass Index (BMI) 40.0 and over, adult: Secondary | ICD-10-CM

## 2022-05-22 DIAGNOSIS — D529 Folate deficiency anemia, unspecified: Secondary | ICD-10-CM | POA: Diagnosis present

## 2022-05-22 DIAGNOSIS — I890 Lymphedema, not elsewhere classified: Secondary | ICD-10-CM | POA: Diagnosis not present

## 2022-05-22 DIAGNOSIS — S81801A Unspecified open wound, right lower leg, initial encounter: Secondary | ICD-10-CM | POA: Diagnosis not present

## 2022-05-22 DIAGNOSIS — Z885 Allergy status to narcotic agent status: Secondary | ICD-10-CM | POA: Diagnosis not present

## 2022-05-22 DIAGNOSIS — R609 Edema, unspecified: Secondary | ICD-10-CM | POA: Diagnosis not present

## 2022-05-22 DIAGNOSIS — B20 Human immunodeficiency virus [HIV] disease: Secondary | ICD-10-CM | POA: Diagnosis present

## 2022-05-22 DIAGNOSIS — D72829 Elevated white blood cell count, unspecified: Secondary | ICD-10-CM | POA: Diagnosis not present

## 2022-05-22 DIAGNOSIS — L03119 Cellulitis of unspecified part of limb: Secondary | ICD-10-CM | POA: Diagnosis present

## 2022-05-22 DIAGNOSIS — M79604 Pain in right leg: Secondary | ICD-10-CM | POA: Diagnosis not present

## 2022-05-22 DIAGNOSIS — M869 Osteomyelitis, unspecified: Secondary | ICD-10-CM | POA: Diagnosis not present

## 2022-05-22 DIAGNOSIS — D649 Anemia, unspecified: Secondary | ICD-10-CM | POA: Diagnosis not present

## 2022-05-22 DIAGNOSIS — Z472 Encounter for removal of internal fixation device: Secondary | ICD-10-CM | POA: Diagnosis not present

## 2022-05-22 DIAGNOSIS — E66813 Obesity, class 3: Secondary | ICD-10-CM

## 2022-05-22 DIAGNOSIS — T84622A Infection and inflammatory reaction due to internal fixation device of right tibia, initial encounter: Secondary | ICD-10-CM | POA: Diagnosis not present

## 2022-05-22 DIAGNOSIS — Z743 Need for continuous supervision: Secondary | ICD-10-CM | POA: Diagnosis not present

## 2022-05-22 DIAGNOSIS — Z86711 Personal history of pulmonary embolism: Secondary | ICD-10-CM | POA: Diagnosis not present

## 2022-05-22 DIAGNOSIS — M86261 Subacute osteomyelitis, right tibia and fibula: Secondary | ICD-10-CM | POA: Diagnosis not present

## 2022-05-22 DIAGNOSIS — Z91018 Allergy to other foods: Secondary | ICD-10-CM | POA: Diagnosis not present

## 2022-05-22 DIAGNOSIS — Z79899 Other long term (current) drug therapy: Secondary | ICD-10-CM

## 2022-05-22 DIAGNOSIS — D638 Anemia in other chronic diseases classified elsewhere: Secondary | ICD-10-CM | POA: Diagnosis not present

## 2022-05-22 LAB — LACTIC ACID, PLASMA: Lactic Acid, Venous: 0.7 mmol/L (ref 0.5–1.9)

## 2022-05-22 LAB — BASIC METABOLIC PANEL
Anion gap: 7 (ref 5–15)
BUN: 10 mg/dL (ref 6–20)
CO2: 26 mmol/L (ref 22–32)
Calcium: 8.4 mg/dL — ABNORMAL LOW (ref 8.9–10.3)
Chloride: 102 mmol/L (ref 98–111)
Creatinine, Ser: 0.89 mg/dL (ref 0.61–1.24)
GFR, Estimated: >60 mL/min (ref >60)
Glucose, Bld: 131 mg/dL — ABNORMAL HIGH (ref 70–99)
Potassium: 4.4 mmol/L (ref 3.5–5.1)
Sodium: 135 mmol/L (ref 135–145)

## 2022-05-22 LAB — CBC WITH DIFFERENTIAL/PLATELET
Abs Immature Granulocytes: 0.16 10*3/uL — ABNORMAL HIGH (ref 0.00–0.07)
Basophils Absolute: 0 10*3/uL (ref 0.0–0.1)
Basophils Relative: 0 %
Eosinophils Absolute: 0.2 10*3/uL (ref 0.0–0.5)
Eosinophils Relative: 1 %
HCT: 24.9 % — ABNORMAL LOW (ref 39.0–52.0)
Hemoglobin: 7.8 g/dL — ABNORMAL LOW (ref 13.0–17.0)
Immature Granulocytes: 1 %
Lymphocytes Relative: 13 %
Lymphs Abs: 2.3 10*3/uL (ref 0.7–4.0)
MCH: 24.7 pg — ABNORMAL LOW (ref 26.0–34.0)
MCHC: 31.3 g/dL (ref 30.0–36.0)
MCV: 78.8 fL — ABNORMAL LOW (ref 80.0–100.0)
Monocytes Absolute: 1.9 10*3/uL — ABNORMAL HIGH (ref 0.1–1.0)
Monocytes Relative: 11 %
Neutro Abs: 12.7 10*3/uL — ABNORMAL HIGH (ref 1.7–7.7)
Neutrophils Relative %: 74 %
Platelets: 445 10*3/uL — ABNORMAL HIGH (ref 150–400)
RBC: 3.16 MIL/uL — ABNORMAL LOW (ref 4.22–5.81)
RDW: 14 % (ref 11.5–15.5)
WBC: 17.3 10*3/uL — ABNORMAL HIGH (ref 4.0–10.5)
nRBC: 0 % (ref 0.0–0.2)

## 2022-05-22 LAB — PROTIME-INR
INR: 0.9 (ref 0.8–1.2)
Prothrombin Time: 12.4 seconds (ref 11.4–15.2)

## 2022-05-22 LAB — TYPE AND SCREEN
ABO/RH(D): O POS
Antibody Screen: NEGATIVE

## 2022-05-22 LAB — T-HELPER CELLS (CD4) COUNT (NOT AT ARMC)
CD4 % Helper T Cell: 49 % (ref 33–65)
CD4 T Cell Abs: 717 /uL (ref 400–1790)

## 2022-05-22 LAB — VITAMIN B12: Vitamin B-12: 1158 pg/mL — ABNORMAL HIGH (ref 180–914)

## 2022-05-22 LAB — IRON AND TIBC
Iron: 20 ug/dL — ABNORMAL LOW (ref 45–182)
Saturation Ratios: 9 % — ABNORMAL LOW (ref 17.9–39.5)
TIBC: 217 ug/dL — ABNORMAL LOW (ref 250–450)
UIBC: 197 ug/dL

## 2022-05-22 LAB — C-REACTIVE PROTEIN: CRP: 24.5 mg/dL — ABNORMAL HIGH (ref <1.0)

## 2022-05-22 LAB — HIV-1 RNA QUANT-NO REFLEX-BLD
HIV 1 RNA Quant: <20 copies/mL
LOG10 HIV-1 RNA: UNDETERMINED log10copy/mL

## 2022-05-22 LAB — FOLATE: Folate: 5.3 ng/mL — ABNORMAL LOW (ref >5.9)

## 2022-05-22 LAB — PROCALCITONIN: Procalcitonin: <0.1 ng/mL

## 2022-05-22 LAB — APTT: aPTT: 27 seconds (ref 24–36)

## 2022-05-22 MED ORDER — LACTATED RINGERS IV SOLN
INTRAVENOUS | Status: AC
Start: 2022-05-22 — End: 2022-05-22

## 2022-05-22 MED ORDER — ENOXAPARIN SODIUM 40 MG/0.4ML IJ SOSY: 40.0000 mg | PREFILLED_SYRINGE | INTRAMUSCULAR | Status: DC

## 2022-05-22 MED ORDER — BICTEGRAVIR-EMTRICITAB-TENOFOV 50-200-25 MG PO TABS
1.0000 | ORAL_TABLET | Freq: Every day | ORAL | Status: DC
  Administered 2022-05-22 – 2022-05-30 (×9): 1 via ORAL
  Filled 2022-05-22 (×12): qty 1

## 2022-05-22 MED ORDER — ONDANSETRON HCL 4 MG PO TABS: 4.0000 mg | ORAL_TABLET | Freq: Four times a day (QID) | ORAL | Status: DC | PRN

## 2022-05-22 MED ORDER — VANCOMYCIN HCL 1500 MG/300ML IV SOLN
1500.0000 mg | Freq: Two times a day (BID) | INTRAVENOUS | Status: DC
  Administered 2022-05-22 – 2022-05-28 (×14): 1500 mg via INTRAVENOUS
  Filled 2022-05-22 (×16): qty 300

## 2022-05-22 MED ORDER — HYDRALAZINE HCL 20 MG/ML IJ SOLN: 10.0000 mg | Freq: Four times a day (QID) | INTRAMUSCULAR | Status: DC | PRN

## 2022-05-22 MED ORDER — VANCOMYCIN HCL 2000 MG/400ML IV SOLN
2000.0000 mg | Freq: Once | INTRAVENOUS | Status: AC
  Administered 2022-05-22: 2000 mg via INTRAVENOUS
  Filled 2022-05-22: qty 400

## 2022-05-22 MED ORDER — SODIUM CHLORIDE 0.9 % IV SOLN
1.0000 g | Freq: Once | INTRAVENOUS | Status: AC
Start: 2022-05-22 — End: 2022-05-22
  Administered 2022-05-22: 1 g via INTRAVENOUS
  Filled 2022-05-22: qty 10

## 2022-05-22 MED ORDER — SODIUM CHLORIDE 0.9 % IV SOLN
2.0000 g | Freq: Three times a day (TID) | INTRAVENOUS | Status: DC
  Administered 2022-05-22 – 2022-05-25 (×10): 2 g via INTRAVENOUS
  Filled 2022-05-22 (×10): qty 12.5

## 2022-05-22 MED ORDER — ACETAMINOPHEN 650 MG RE SUPP
650.0000 mg | Freq: Four times a day (QID) | RECTAL | Status: DC | PRN
Start: 2022-05-22 — End: 2022-05-26

## 2022-05-22 MED ORDER — ONDANSETRON HCL 4 MG/2ML IJ SOLN: 4.0000 mg | Freq: Four times a day (QID) | INTRAMUSCULAR | Status: DC | PRN

## 2022-05-22 MED ORDER — OXYCODONE-ACETAMINOPHEN 5-325 MG PO TABS
1.0000 | ORAL_TABLET | Freq: Four times a day (QID) | ORAL | Status: DC | PRN
  Administered 2022-05-22 – 2022-05-26 (×9): 1 via ORAL
  Filled 2022-05-22 (×9): qty 1

## 2022-05-22 MED ORDER — ACETAMINOPHEN 325 MG PO TABS
650.0000 mg | ORAL_TABLET | Freq: Four times a day (QID) | ORAL | Status: DC | PRN
  Administered 2022-05-23: 650 mg via ORAL
  Filled 2022-05-22: qty 2

## 2022-05-22 MED ORDER — POLYETHYLENE GLYCOL 3350 17 G PO PACK: 17.0000 g | PACK | Freq: Every day | ORAL | Status: DC | PRN

## 2022-05-22 MED ORDER — LACTATED RINGERS IV BOLUS
500.0000 mL | Freq: Once | INTRAVENOUS | Status: AC
  Administered 2022-05-22: 500 mL via INTRAVENOUS

## 2022-05-22 MED ORDER — FOLIC ACID 1 MG PO TABS
1.0000 mg | ORAL_TABLET | Freq: Every day | ORAL | Status: DC
  Administered 2022-05-22 – 2022-06-05 (×14): 1 mg via ORAL
  Filled 2022-05-22 (×14): qty 1

## 2022-05-22 MED ORDER — IBUPROFEN 400 MG PO TABS
800.0000 mg | ORAL_TABLET | Freq: Once | ORAL | Status: AC
  Administered 2022-05-22: 800 mg via ORAL
  Filled 2022-05-22: qty 2

## 2022-05-22 NOTE — Assessment & Plan Note (Signed)
   Patient exhibiting substantial anemia without evidence of bleeding  Likely related to longstanding HIV infection  We will perform basic work-up including iron panel vitamin B12 and folate to see if there is anything that we can correct/supplement  Otherwise, monitor serial CBCs and supportive care.

## 2022-05-22 NOTE — Plan of Care (Signed)
?  Problem: Clinical Measurements: ?Goal: Ability to avoid or minimize complications of infection will improve ?Outcome: Progressing ?  ?Problem: Skin Integrity: ?Goal: Skin integrity will improve ?Outcome: Progressing ?  ?

## 2022-05-22 NOTE — Assessment & Plan Note (Signed)
   1 cm diameter wound noted on the lateral surface of the right leg, present on admission.  Patient reports that this wound developed just before the cellulitis began and is likely the source of infection  Placing wound care consult for assistance in obtaining recommendations for dressing changes  We will place order for aerobic culture considering there is significant amounts of purulent drainage.

## 2022-05-22 NOTE — Progress Notes (Addendum)
Patient seen and examined, admitted by Dr. Leafy Half this morning  Briefly 41 year old male with past medical history of HIV, obesity, ORIF of the right bicondylar plateau in 2016, chronic lymphedema of RLE presented with right lower extremity cellulitis.  He has been on oral antibiotics outpatient x2 courses with no significant improvement  BP (!) 147/77   Pulse 95   Temp 98 F (36.7 C) (Oral)   Resp (!) 26   SpO2 97%    On exam, significant edema/chronic lymphedema of the right lower extremity with erythema, warmth and tenderness.  Pain with range of movement of the right knee. Labs reviewed  Cellulitis of the right lower extremity -Outpatient failure to treatment due to antibiotic courses - will continue IV vancomycin and cefepime -Follow cultures -Right tib-fib x-ray showed limited study with bony irregularity in the proximal tibia at the site of indwelling hardware, soft tissues are diffusely swollen suggesting cellulitis.  If there is a clinical concern for osteomyelitis recommended right leg MRI -Continue wound care, pain control -MRI of the right lower extremity if no significant improvement in next 24 to 48 hours -Follow Doppler ultrasound RLE to rule out DVT  Other issues per assessment plan in H&P   Todd Parrish M.D.  [facility] 05/22/2022, 1:45 PM

## 2022-05-22 NOTE — Assessment & Plan Note (Signed)
   Continuing home regimen of Biktarvy while hospitalized  Most recent CD4 count and viral load from 2022 are consistent with medication compliance  Repeat CD4 and viral load ordered

## 2022-05-22 NOTE — Assessment & Plan Note (Addendum)
   Morbid obesity likely contributing to patient's degree of lymphedema which plays a role in his frequent episodes of lower extremity cellulitis  Counseling patient on caloric restriction and regular physical activity
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
## 2022-05-22 NOTE — ED Notes (Addendum)
Pt refusing further blood draw sticks required to obtain blood cultures. MD made aware.

## 2022-05-22 NOTE — Progress Notes (Signed)
Pharmacy Antibiotic Note  Todd Parrish is a 41 y.o. male with h/o HIV admitted on 05/22/2022 with cellulitis.  Pharmacy has been consulted for Vancomycin and Cefepime  dosing.  Plan: Vancomycin 2000 mg IV now, then 1500 mg IV q12h Cefepime 2 g IV q8h    Temp (24hrs), Avg:98 F (36.7 C), Min:98 F (36.7 C), Max:98 F (36.7 C)  Recent Labs  Lab 05/19/22 1809 05/22/22 0416  WBC 13.1* 17.3*  CREATININE 0.78 0.89    Estimated Creatinine Clearance: 154.9 mL/min (by C-G formula based on SCr of 0.89 mg/dL).    Allergies  Allergen Reactions   Morphine And Related Nausea And Vomiting and Other (See Comments)    Elevated blood pressure    Pork-Derived Products Other (See Comments)    No pork for religious reasons - doesn't eat pork No pork for religious reasons   Shellfish Allergy Other (See Comments)    No shrimp for religious reasons -doesn't eat shrimp religious puroses No shrimp for religious reasons     Eddie Candle 05/22/2022 6:01 AM

## 2022-05-22 NOTE — ED Notes (Signed)
Antibiotic and blood work delay due to access issue. MD aware. IV team consult placed.
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
## 2022-05-22 NOTE — ED Notes (Signed)
1st lactic normal, 2nd to be d/c.
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
## 2022-05-22 NOTE — Assessment & Plan Note (Addendum)
   Patient suffering from cellulitis of the right lower extremity  Patient requires inpatient treatment of their cellulitis due to failure to treat with oral antibiotics with Augmentin and doxycycline  Imaging performed in the emergency department has yet to be performed and therefore I have ordered an x-ray of the right lower extremity in addition to an ultrasound to ensure there is no concurrent DVT (albeit unlikely).  Based on these results day provider can decide whether further imaging is indicated such as MRI.  While patient does have substantial leukocytosis there is no clinical evidence of sepsis  Treating with intravenous antibiotics including cefepime and vancomycin considering patient's immunocompromise state with HIV  Intravenous volume resuscitation  Blood cultures ordered, CRP ordered, lactic acid ordered.  As needed opiate-based analgesics for associated substantial pain.  Wound care consult for associated 1 cm ulceration present on admission which is likely the source of the patient's cellulitis.  Wound culture additionally ordered of this draining wound.

## 2022-05-22 NOTE — Assessment & Plan Note (Signed)
.   Documented history of hypertension however patient is not on antihypertensives on his home medication list . Patient appears normotensive on arrival . PRN intravenous antihypertensives for excessively elevated blood pressure
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
## 2022-05-22 NOTE — Telephone Encounter (Signed)
Transition Care Management Unsuccessful Follow-up Telephone Call  Date of discharge and from where:  05/19/2022[facility]  Attempts:  3rd Attempt  Reason for unsuccessful TCM follow-up call:  Left voice message

## 2022-05-22 NOTE — Progress Notes (Signed)
Lower extremity venous right study completed.   Please see CV Proc for preliminary results.   Ansleigh Safer, RDMS, RVT  

## 2022-05-23 LAB — COMPREHENSIVE METABOLIC PANEL
ALT: 16 U/L (ref 0–44)
AST: 12 U/L — ABNORMAL LOW (ref 15–41)
Albumin: 2.3 g/dL — ABNORMAL LOW (ref 3.5–5.0)
Alkaline Phosphatase: 64 U/L (ref 38–126)
Anion gap: 6 (ref 5–15)
BUN: 9 mg/dL (ref 6–20)
CO2: 25 mmol/L (ref 22–32)
Calcium: 8.4 mg/dL — ABNORMAL LOW (ref 8.9–10.3)
Chloride: 104 mmol/L (ref 98–111)
Creatinine, Ser: 0.88 mg/dL (ref 0.61–1.24)
GFR, Estimated: >60 mL/min (ref >60)
Glucose, Bld: 132 mg/dL — ABNORMAL HIGH (ref 70–99)
Potassium: 3.3 mmol/L — ABNORMAL LOW (ref 3.5–5.1)
Sodium: 135 mmol/L (ref 135–145)
Total Bilirubin: 0.4 mg/dL (ref 0.3–1.2)
Total Protein: 7.7 g/dL (ref 6.5–8.1)

## 2022-05-23 LAB — CBC WITH DIFFERENTIAL/PLATELET
Abs Immature Granulocytes: 0.13 10*3/uL — ABNORMAL HIGH (ref 0.00–0.07)
Basophils Absolute: 0 10*3/uL (ref 0.0–0.1)
Basophils Relative: 0 %
Eosinophils Absolute: 0.1 10*3/uL (ref 0.0–0.5)
Eosinophils Relative: 1 %
HCT: 31.4 % — ABNORMAL LOW (ref 39.0–52.0)
Hemoglobin: 9.9 g/dL — ABNORMAL LOW (ref 13.0–17.0)
Immature Granulocytes: 1 %
Lymphocytes Relative: 13 %
Lymphs Abs: 1.9 10*3/uL (ref 0.7–4.0)
MCH: 24.3 pg — ABNORMAL LOW (ref 26.0–34.0)
MCHC: 31.5 g/dL (ref 30.0–36.0)
MCV: 77.1 fL — ABNORMAL LOW (ref 80.0–100.0)
Monocytes Absolute: 1.5 10*3/uL — ABNORMAL HIGH (ref 0.1–1.0)
Monocytes Relative: 11 %
Neutro Abs: 10.4 10*3/uL — ABNORMAL HIGH (ref 1.7–7.7)
Neutrophils Relative %: 74 %
Platelets: 364 10*3/uL (ref 150–400)
RBC: 4.07 MIL/uL — ABNORMAL LOW (ref 4.22–5.81)
RDW: 13.9 % (ref 11.5–15.5)
WBC: 14.1 10*3/uL — ABNORMAL HIGH (ref 4.0–10.5)
nRBC: 0 % (ref 0.0–0.2)

## 2022-05-23 LAB — MAGNESIUM: Magnesium: 1.8 mg/dL (ref 1.7–2.4)

## 2022-05-23 MED ORDER — RIVAROXABAN 10 MG PO TABS
10.0000 mg | ORAL_TABLET | Freq: Every day | ORAL | Status: DC
  Administered 2022-05-23 – 2022-05-25 (×3): 10 mg via ORAL
  Filled 2022-05-23 (×3): qty 1

## 2022-05-23 MED ORDER — POTASSIUM CHLORIDE CRYS ER 20 MEQ PO TBCR
40.0000 meq | EXTENDED_RELEASE_TABLET | Freq: Once | ORAL | Status: AC
  Administered 2022-05-23: 40 meq via ORAL
  Filled 2022-05-23: qty 2

## 2022-05-23 MED ORDER — IBUPROFEN 400 MG PO TABS
800.0000 mg | ORAL_TABLET | Freq: Three times a day (TID) | ORAL | Status: DC | PRN
  Administered 2022-05-23 – 2022-05-26 (×6): 800 mg via ORAL
  Filled 2022-05-23 (×6): qty 2
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
## 2022-05-23 NOTE — Progress Notes (Signed)
Progress Note   Patient: Todd Parrish [id number] DOB: September 28, 1981 DOA: 05/22/2022     1 DOS: the patient was seen and examined on 05/23/2022   Brief hospital course: No notes on file  Assessment and Plan: * Cellulitis of right lower extremity Patient suffering from cellulitis of the right lower extremity. Patient requires inpatient treatment of their cellulitis due to failure to treat with oral antibiotics with Augmentin and doxycycline.  X-ray was obtained on admission but difficult to tell based on the x-ray if there is evidence of osteomyelitis.  Patient reports that his symptoms are significant improved and he is much less tender.  Given clinical improvement he has had on his antibiotics we will hold off on MRI at this time. -cefepime and vancomycin considering patient's immunocompromise state with HIV (low viral load, CD4 717)  -Blood cultures no growth to date -Patient reports Percocet gives him sweats, okay for ibuprofen -Wound care consult for associated 1 cm ulceration present on admission which is likely the source of the patient's cellulitis -Wound culture additionally ordered of this draining wound, has rare staph, recultured   Human immunodeficiency virus (HIV) disease ([place]) Viral load undetectable and CD4 717. -Continuing home regimen of Biktarvy while hospitalized  Wound of right leg 1 cm diameter wound noted on the lateral surface of the right leg, present on admission. Patient reports that this wound developed just before the cellulitis began and is likely the source of infection -wound care consult  -Wound culture no growth to date  Class 3 severe obesity due to excess calories with serious comorbidity and body mass index (BMI) of 50.0 to 59.9 in adult [facility]) Morbid obesity likely contributing to patient's degree of lymphedema which plays a role in his frequent episodes of lower extremity cellulitis. Counseling patient on caloric restriction and regular  physical activity  Essential hypertension Documented history of hypertension however patient is not on antihypertensives on his home medication list Patient appears normotensive on arrival PRN intravenous antihypertensives for excessively elevated blood pressure  Anemia of chronic disease Patient exhibiting substantial anemia without evidence of bleeding.  Iron studies suggest iron deficiency anemia, and low folate. -folate repletion -iron after infection resolve  Hypokalemia, replete  DVT: rivaroxaban 10 mg, declined pork products Code: full MDPOA: Sister Amy     Subjective: right leg much better today, can move it without pain.   Physical Exam: Vitals:   05/23/22 0000 05/23/22 0318 05/23/22 0800 05/23/22 1202  BP: (!) 125/59 120/75 (!) 148/71 (!) 148/73  Pulse: 85 78 84 93  Resp: 16 18 18 20   Temp: 98.9 F (37.2 C) 98.7 F (37.1 C) 98.5 F (36.9 C)   TempSrc: Oral Oral Oral   SpO2: 97% 96% 97% 95%   Physical Exam Vitals and nursing note reviewed.  Constitutional:      Appearance: Normal appearance.  HENT:     Head: Normocephalic and atraumatic.  Eyes:     Pupils: Pupils are equal, round, and reactive to light.  Cardiovascular:     Rate and Rhythm: Normal rate.     Heart sounds: No murmur heard. Pulmonary:     Effort: Pulmonary effort is normal. No respiratory distress.  Abdominal:     Hernia: A hernia is present.  Musculoskeletal:     Right lower leg: Swelling present. No tenderness. Edema present.     Comments: Significant lymphedema R LE  Skin:    General: Skin is warm and dry.     Capillary Refill: Capillary refill  takes less than 2 seconds.     Findings: No erythema.  Neurological:     Mental Status: He is alert and oriented to person, place, and time.  Psychiatric:        Mood and Affect: Mood normal.        Behavior: Behavior normal.    Data Reviewed:     Latest Ref Rng & Units 05/23/2022    1:38 AM 05/22/2022    4:16 AM 05/19/2022    6:09 PM   CBC  WBC 4.0 - 10.5 K/uL 14.1   17.3   13.1    Hemoglobin 13.0 - 17.0 g/dL 9.9   7.8   9.9    Hematocrit 39.0 - 52.0 % 31.4   24.9   33.4    Platelets 150 - 400 K/uL 364   445   370        Latest Ref Rng & Units 05/23/2022    1:38 AM 05/22/2022    4:16 AM 05/19/2022    6:09 PM  BMP  Glucose 70 - 99 mg/dL 132   131   93    BUN 6 - 20 mg/dL 9   10   11     Creatinine 0.61 - 1.24 mg/dL 0.88   0.89   0.78    Sodium 135 - 145 mmol/L 135   135   137    Potassium 3.5 - 5.1 mmol/L 3.3   4.4   3.8    Chloride 98 - 111 mmol/L 104   102   98    CO2 22 - 32 mmol/L 25   26   28     Calcium 8.9 - 10.3 mg/dL 8.4   8.4   9.4       Family Communication: none at bedside  Disposition: Status is: Inpatient Remains inpatient appropriate because: IV antibiotics  Planned Discharge Destination: Home    Time spent: 40 minutes  Author: Lorelei Pont, MD 05/23/2022 6:30 PM  For on call review [email or web address].

## 2022-05-24 ENCOUNTER — Inpatient Hospital Stay (HOSPITAL_COMMUNITY): Payer: Medicaid Other

## 2022-05-24 DIAGNOSIS — L03115 Cellulitis of right lower limb: Secondary | ICD-10-CM | POA: Diagnosis not present

## 2022-05-24 DIAGNOSIS — M7989 Other specified soft tissue disorders: Secondary | ICD-10-CM | POA: Diagnosis not present

## 2022-05-24 LAB — BASIC METABOLIC PANEL
Anion gap: 9 (ref 5–15)
BUN: 9 mg/dL (ref 6–20)
CO2: 25 mmol/L (ref 22–32)
Calcium: 8.4 mg/dL — ABNORMAL LOW (ref 8.9–10.3)
Chloride: 102 mmol/L (ref 98–111)
Creatinine, Ser: 0.82 mg/dL (ref 0.61–1.24)
GFR, Estimated: >60 mL/min (ref >60)
Glucose, Bld: 148 mg/dL — ABNORMAL HIGH (ref 70–99)
Potassium: 4 mmol/L (ref 3.5–5.1)
Sodium: 136 mmol/L (ref 135–145)

## 2022-05-24 LAB — PROCALCITONIN: Procalcitonin: <0.1 ng/mL

## 2022-05-24 LAB — CBC
HCT: 31.8 % — ABNORMAL LOW (ref 39.0–52.0)
Hemoglobin: 10.1 g/dL — ABNORMAL LOW (ref 13.0–17.0)
MCH: 24 pg — ABNORMAL LOW (ref 26.0–34.0)
MCHC: 31.8 g/dL (ref 30.0–36.0)
MCV: 75.5 fL — ABNORMAL LOW (ref 80.0–100.0)
Platelets: 402 10*3/uL — ABNORMAL HIGH (ref 150–400)
RBC: 4.21 MIL/uL — ABNORMAL LOW (ref 4.22–5.81)
RDW: 14.1 % (ref 11.5–15.5)
WBC: 16.8 10*3/uL — ABNORMAL HIGH (ref 4.0–10.5)
nRBC: 0 % (ref 0.0–0.2)

## 2022-05-24 LAB — C-REACTIVE PROTEIN: CRP: 29.7 mg/dL — ABNORMAL HIGH (ref <1.0)

## 2022-05-24 LAB — BRAIN NATRIURETIC PEPTIDE: B Natriuretic Peptide: 55.1 pg/mL (ref 0.0–100.0)

## 2022-05-24 LAB — MRSA NEXT GEN BY PCR, NASAL: MRSA by PCR Next Gen: DETECTED — AB

## 2022-05-24 MED ORDER — GADOBUTROL 1 MMOL/ML IV SOLN
10.0000 mL | Freq: Once | INTRAVENOUS | Status: AC | PRN
  Administered 2022-05-24: 10 mL via INTRAVENOUS

## 2022-05-24 MED ORDER — DOCUSATE SODIUM 100 MG PO CAPS
200.0000 mg | ORAL_CAPSULE | Freq: Two times a day (BID) | ORAL | Status: DC
  Administered 2022-05-30 – 2022-06-01 (×3): 200 mg via ORAL
  Filled 2022-05-24 (×17): qty 2

## 2022-05-24 MED ORDER — FERROUS SULFATE 325 (65 FE) MG PO TABS
325.0000 mg | ORAL_TABLET | Freq: Two times a day (BID) | ORAL | Status: DC
  Administered 2022-05-24 – 2022-06-05 (×21): 325 mg via ORAL
  Filled 2022-05-24 (×23): qty 1

## 2022-05-24 MED ORDER — PANTOPRAZOLE SODIUM 40 MG PO TBEC
40.0000 mg | DELAYED_RELEASE_TABLET | Freq: Every day | ORAL | Status: DC
  Administered 2022-05-25 – 2022-06-05 (×11): 40 mg via ORAL
  Filled 2022-05-24 (×12): qty 1
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
## 2022-05-25 ENCOUNTER — Inpatient Hospital Stay (HOSPITAL_COMMUNITY): Payer: Medicaid Other

## 2022-05-25 DIAGNOSIS — T847XXA Infection and inflammatory reaction due to other internal orthopedic prosthetic devices, implants and grafts, initial encounter: Secondary | ICD-10-CM | POA: Diagnosis not present

## 2022-05-25 DIAGNOSIS — Z6841 Body Mass Index (BMI) 40.0 and over, adult: Secondary | ICD-10-CM | POA: Diagnosis not present

## 2022-05-25 DIAGNOSIS — L03115 Cellulitis of right lower limb: Secondary | ICD-10-CM | POA: Diagnosis not present

## 2022-05-25 DIAGNOSIS — B20 Human immunodeficiency virus [HIV] disease: Secondary | ICD-10-CM | POA: Diagnosis not present

## 2022-05-25 DIAGNOSIS — R600 Localized edema: Secondary | ICD-10-CM | POA: Diagnosis not present

## 2022-05-25 LAB — COMPREHENSIVE METABOLIC PANEL
ALT: 23 U/L (ref 0–44)
AST: 23 U/L (ref 15–41)
Albumin: 2.1 g/dL — ABNORMAL LOW (ref 3.5–5.0)
Alkaline Phosphatase: 89 U/L (ref 38–126)
Anion gap: 10 (ref 5–15)
BUN: 9 mg/dL (ref 6–20)
CO2: 21 mmol/L — ABNORMAL LOW (ref 22–32)
Calcium: 8.2 mg/dL — ABNORMAL LOW (ref 8.9–10.3)
Chloride: 103 mmol/L (ref 98–111)
Creatinine, Ser: 0.93 mg/dL (ref 0.61–1.24)
GFR, Estimated: >60 mL/min (ref >60)
Glucose, Bld: 132 mg/dL — ABNORMAL HIGH (ref 70–99)
Potassium: 3.8 mmol/L (ref 3.5–5.1)
Sodium: 134 mmol/L — ABNORMAL LOW (ref 135–145)
Total Bilirubin: 0.4 mg/dL (ref 0.3–1.2)
Total Protein: 8 g/dL (ref 6.5–8.1)

## 2022-05-25 LAB — AEROBIC CULTURE W GRAM STAIN (SUPERFICIAL SPECIMEN): Gram Stain: NONE SEEN

## 2022-05-25 LAB — TYPE AND SCREEN
ABO/RH(D): O POS
Antibody Screen: NEGATIVE

## 2022-05-25 LAB — PROTIME-INR
INR: 1.7 — ABNORMAL HIGH (ref 0.8–1.2)
Prothrombin Time: 19.6 seconds — ABNORMAL HIGH (ref 11.4–15.2)

## 2022-05-25 LAB — C-REACTIVE PROTEIN: CRP: 29.9 mg/dL — ABNORMAL HIGH (ref <1.0)

## 2022-05-25 LAB — VANCOMYCIN, PEAK: Vancomycin Pk: 25 ug/mL — ABNORMAL LOW (ref 30–40)

## 2022-05-25 LAB — BRAIN NATRIURETIC PEPTIDE: B Natriuretic Peptide: 47.7 pg/mL (ref 0.0–100.0)

## 2022-05-25 LAB — MAGNESIUM: Magnesium: 1.9 mg/dL (ref 1.7–2.4)

## 2022-05-25 LAB — PROCALCITONIN: Procalcitonin: 0.24 ng/mL

## 2022-05-25 MED ORDER — MUPIROCIN 2 % EX OINT
TOPICAL_OINTMENT | CUTANEOUS | Status: AC
  Filled 2022-05-25: qty 22

## 2022-05-25 MED ORDER — CARVEDILOL 3.125 MG PO TABS
ORAL_TABLET | ORAL | Status: AC
  Filled 2022-05-25: qty 1

## 2022-05-25 MED ORDER — MUPIROCIN 2 % EX OINT
TOPICAL_OINTMENT | Freq: Two times a day (BID) | CUTANEOUS | Status: AC
  Administered 2022-05-26: 1 via NASAL
  Filled 2022-05-25: qty 22

## 2022-05-25 MED ORDER — CARVEDILOL 3.125 MG PO TABS
3.1250 mg | ORAL_TABLET | Freq: Two times a day (BID) | ORAL | Status: DC
  Administered 2022-05-25 – 2022-06-05 (×21): 3.125 mg via ORAL
  Filled 2022-05-25 (×22): qty 1

## 2022-05-25 MED ORDER — IOHEXOL 300 MG/ML  SOLN
100.0000 mL | Freq: Once | INTRAMUSCULAR | Status: AC | PRN
Start: 2022-05-25 — End: 2022-05-25
  Administered 2022-05-25: 100 mL via INTRAVENOUS

## 2022-05-25 NOTE — Progress Notes (Signed)
  Transition of Care [facility]) Screening Note   Patient Details  Name: Todd Parrish Date of Birth: 05/23/1981   [facility]) CM/SW Contact:    Harriet Masson, RN Phone Number: 05/25/2022, 8:48 AM    Transition of Care Department [facility]) has reviewed patient and no TOC needs have been identified at this time. We will continue to monitor patient advancement through interdisciplinary progression rounds. If new patient transition needs arise, please place a TOC consult.

## 2022-05-25 NOTE — Anesthesia Preprocedure Evaluation (Addendum)
Anesthesia Evaluation  Patient identified by MRN, date of birth, ID band Patient awake    Reviewed: Allergy & Precautions, NPO status , Patient's Chart, lab work & pertinent test results, reviewed documented beta blocker date and time   History of Anesthesia Complications Negative for: history of anesthetic complications  Airway Mallampati: III  TM Distance: >3 FB Neck ROM: Full    Dental  (+) Poor Dentition, Dental Advisory Given   Pulmonary neg pulmonary ROS,    Pulmonary exam normal        Cardiovascular hypertension, Pt. on home beta blockers Normal cardiovascular exam     Neuro/Psych negative neurological ROS     GI/Hepatic Neg liver ROS, GERD  ,  Endo/Other  Morbid obesity  Renal/GU negative Renal ROS     Musculoskeletal negative musculoskeletal ROS (+)   Abdominal   Peds  Hematology  (+) Blood dyscrasia, anemia , HIV,   Anesthesia Other Findings   Reproductive/Obstetrics                            Anesthesia Physical Anesthesia Plan  ASA: 3  Anesthesia Plan: General   Post-op Pain Management: Celebrex PO (pre-op)* and Regional block*   Induction: Intravenous  PONV Risk Score and Plan: 3 and Ondansetron, Midazolam and Scopolamine patch - Pre-op  Airway Management Planned: Oral ETT  Additional Equipment:   Intra-op Plan:   Post-operative Plan: Extubation in OR  Informed Consent: I have reviewed the patients History and Physical, chart, labs and discussed the procedure including the risks, benefits and alternatives for the proposed anesthesia with the patient or authorized representative who has indicated his/her understanding and acceptance.     Dental advisory given  Plan Discussed with: Anesthesiologist, Surgeon and CRNA  Anesthesia Plan Comments:       Anesthesia Quick Evaluation
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
## 2022-05-25 NOTE — Progress Notes (Signed)
Pt left via [facility] tp transfer to [facility]. Pt refused pm meds prior to D/C. IV remained intact.
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
## 2022-05-26 ENCOUNTER — Inpatient Hospital Stay (HOSPITAL_COMMUNITY): Payer: Medicaid Other | Admitting: Certified Registered"

## 2022-05-26 ENCOUNTER — Ambulatory Visit: Admit: 2022-05-26 | Payer: Medicaid Other | Admitting: Orthopedic Surgery

## 2022-05-26 ENCOUNTER — Encounter (HOSPITAL_COMMUNITY)
Admission: EM | Disposition: A | Discharge status: Home Health | Payer: Self-pay | Source: Home / Self Care | Attending: Internal Medicine

## 2022-05-26 ENCOUNTER — Other Ambulatory Visit (HOSPITAL_COMMUNITY): Payer: Self-pay

## 2022-05-26 ENCOUNTER — Other Ambulatory Visit: Payer: Self-pay

## 2022-05-26 ENCOUNTER — Inpatient Hospital Stay (HOSPITAL_COMMUNITY): Payer: Medicaid Other

## 2022-05-26 ENCOUNTER — Encounter (HOSPITAL_COMMUNITY): Payer: Self-pay | Admitting: Internal Medicine

## 2022-05-26 DIAGNOSIS — Z6841 Body Mass Index (BMI) 40.0 and over, adult: Secondary | ICD-10-CM | POA: Diagnosis not present

## 2022-05-26 DIAGNOSIS — I10 Essential (primary) hypertension: Secondary | ICD-10-CM | POA: Diagnosis not present

## 2022-05-26 DIAGNOSIS — L03115 Cellulitis of right lower limb: Secondary | ICD-10-CM | POA: Diagnosis not present

## 2022-05-26 DIAGNOSIS — Z472 Encounter for removal of internal fixation device: Secondary | ICD-10-CM

## 2022-05-26 HISTORY — PX: HARDWARE REMOVAL: SHX979

## 2022-05-26 LAB — CBC WITH DIFFERENTIAL/PLATELET
Abs Immature Granulocytes: 0.37 10*3/uL — ABNORMAL HIGH (ref 0.00–0.07)
Basophils Absolute: 0.1 10*3/uL (ref 0.0–0.1)
Basophils Relative: 0 %
Eosinophils Absolute: 0.4 10*3/uL (ref 0.0–0.5)
Eosinophils Relative: 2 %
HCT: 30 % — ABNORMAL LOW (ref 39.0–52.0)
Hemoglobin: 9.3 g/dL — ABNORMAL LOW (ref 13.0–17.0)
Immature Granulocytes: 2 %
Lymphocytes Relative: 7 %
Lymphs Abs: 1.4 10*3/uL (ref 0.7–4.0)
MCH: 23.9 pg — ABNORMAL LOW (ref 26.0–34.0)
MCHC: 31 g/dL (ref 30.0–36.0)
MCV: 77.1 fL — ABNORMAL LOW (ref 80.0–100.0)
Monocytes Absolute: 1.8 10*3/uL — ABNORMAL HIGH (ref 0.1–1.0)
Monocytes Relative: 10 %
Neutro Abs: 15 10*3/uL — ABNORMAL HIGH (ref 1.7–7.7)
Neutrophils Relative %: 79 %
Platelets: 460 10*3/uL — ABNORMAL HIGH (ref 150–400)
RBC: 3.89 MIL/uL — ABNORMAL LOW (ref 4.22–5.81)
RDW: 14.5 % (ref 11.5–15.5)
WBC: 19 10*3/uL — ABNORMAL HIGH (ref 4.0–10.5)
nRBC: 0 % (ref 0.0–0.2)

## 2022-05-26 LAB — COMPREHENSIVE METABOLIC PANEL
ALT: 23 U/L (ref 0–44)
AST: 18 U/L (ref 15–41)
Albumin: 2.3 g/dL — ABNORMAL LOW (ref 3.5–5.0)
Alkaline Phosphatase: 79 U/L (ref 38–126)
Anion gap: 8 (ref 5–15)
BUN: 12 mg/dL (ref 6–20)
CO2: 24 mmol/L (ref 22–32)
Calcium: 8.2 mg/dL — ABNORMAL LOW (ref 8.9–10.3)
Chloride: 102 mmol/L (ref 98–111)
Creatinine, Ser: 0.76 mg/dL (ref 0.61–1.24)
GFR, Estimated: >60 mL/min (ref >60)
Glucose, Bld: 138 mg/dL — ABNORMAL HIGH (ref 70–99)
Potassium: 3.5 mmol/L (ref 3.5–5.1)
Sodium: 134 mmol/L — ABNORMAL LOW (ref 135–145)
Total Bilirubin: 0.5 mg/dL (ref 0.3–1.2)
Total Protein: 8.2 g/dL — ABNORMAL HIGH (ref 6.5–8.1)

## 2022-05-26 LAB — MAGNESIUM: Magnesium: 2.1 mg/dL (ref 1.7–2.4)

## 2022-05-26 LAB — C-REACTIVE PROTEIN: CRP: 30.5 mg/dL — ABNORMAL HIGH (ref <1.0)

## 2022-05-26 LAB — VANCOMYCIN, TROUGH
Vancomycin Tr: 7 ug/mL — ABNORMAL LOW (ref 15–20)
Vancomycin Tr: 15 ug/mL (ref 15–20)

## 2022-05-26 LAB — RPR
RPR Ser Ql: REACTIVE — AB
RPR Titer: 1:4 {titer}

## 2022-05-26 LAB — T.PALLIDUM AB, TOTAL: T Pallidum Abs: REACTIVE — AB

## 2022-05-26 LAB — PROCALCITONIN: Procalcitonin: 0.13 ng/mL

## 2022-05-26 LAB — BRAIN NATRIURETIC PEPTIDE: B Natriuretic Peptide: 109.7 pg/mL — ABNORMAL HIGH (ref 0.0–100.0)

## 2022-05-26 SURGERY — REMOVAL, HARDWARE
Anesthesia: General | Laterality: Right

## 2022-05-26 MED ORDER — DEXAMETHASONE SODIUM PHOSPHATE 10 MG/ML IJ SOLN
INTRAMUSCULAR | Status: AC
Start: 2022-05-26 — End: ?
  Filled 2022-05-26: qty 1

## 2022-05-26 MED ORDER — DEXAMETHASONE SODIUM PHOSPHATE 10 MG/ML IJ SOLN
INTRAMUSCULAR | Status: DC | PRN
  Administered 2022-05-26: 5 mg
  Administered 2022-05-26: 10 mg

## 2022-05-26 MED ORDER — ROCURONIUM BROMIDE 10 MG/ML (PF) SYRINGE
PREFILLED_SYRINGE | INTRAVENOUS | Status: AC
  Filled 2022-05-26: qty 10

## 2022-05-26 MED ORDER — ROCURONIUM BROMIDE 10 MG/ML (PF) SYRINGE
PREFILLED_SYRINGE | INTRAVENOUS | Status: DC | PRN
  Administered 2022-05-26: 100 mg via INTRAVENOUS

## 2022-05-26 MED ORDER — OXYCODONE HCL 5 MG PO TABS
10.0000 mg | ORAL_TABLET | ORAL | Status: DC | PRN
  Administered 2022-05-29 – 2022-05-30 (×2): 10 mg via ORAL
  Administered 2022-05-31 – 2022-06-04 (×9): 15 mg via ORAL
  Filled 2022-05-26 (×6): qty 3
  Filled 2022-05-26: qty 2
  Filled 2022-05-26 (×4): qty 3

## 2022-05-26 MED ORDER — CHLORHEXIDINE GLUCONATE 4 % EX LIQD: 60.0000 mL | Freq: Once | CUTANEOUS | Status: DC

## 2022-05-26 MED ORDER — MIDAZOLAM HCL 2 MG/2ML IJ SOLN: 2.0000 mg | Freq: Once | INTRAMUSCULAR | Status: AC

## 2022-05-26 MED ORDER — VANCOMYCIN HCL 1000 MG IV SOLR
INTRAVENOUS | Status: AC
  Filled 2022-05-26: qty 20

## 2022-05-26 MED ORDER — DIPHENHYDRAMINE HCL 12.5 MG/5ML PO ELIX: 12.5000 mg | ORAL_SOLUTION | ORAL | Status: DC | PRN

## 2022-05-26 MED ORDER — FENTANYL CITRATE (PF) 250 MCG/5ML IJ SOLN
INTRAMUSCULAR | Status: DC | PRN
  Administered 2022-05-26 (×3): 50 ug via INTRAVENOUS

## 2022-05-26 MED ORDER — FENTANYL CITRATE (PF) 100 MCG/2ML IJ SOLN
INTRAMUSCULAR | Status: AC
  Filled 2022-05-26: qty 2

## 2022-05-26 MED ORDER — OXYCODONE HCL 5 MG PO TABS
5.0000 mg | ORAL_TABLET | ORAL | Status: DC | PRN
  Administered 2022-05-27 – 2022-05-28 (×2): 5 mg via ORAL
  Administered 2022-05-28: 10 mg via ORAL
  Administered 2022-05-28: 5 mg via ORAL
  Administered 2022-05-29 – 2022-06-03 (×2): 10 mg via ORAL
  Filled 2022-05-26: qty 1
  Filled 2022-05-26 (×3): qty 2
  Filled 2022-05-26: qty 1
  Filled 2022-05-26: qty 2
  Filled 2022-05-26: qty 1

## 2022-05-26 MED ORDER — FENTANYL CITRATE PF 50 MCG/ML IJ SOSY: 100.0000 ug | PREFILLED_SYRINGE | Freq: Once | INTRAMUSCULAR | Status: AC

## 2022-05-26 MED ORDER — ROPIVACAINE HCL 7.5 MG/ML IJ SOLN
INTRAMUSCULAR | Status: DC | PRN
  Administered 2022-05-26: 20 mL via PERINEURAL

## 2022-05-26 MED ORDER — METOCLOPRAMIDE HCL 5 MG PO TABS: 5.0000 mg | ORAL_TABLET | Freq: Three times a day (TID) | ORAL | Status: DC | PRN

## 2022-05-26 MED ORDER — ACETAMINOPHEN 325 MG PO TABS
325.0000 mg | ORAL_TABLET | Freq: Four times a day (QID) | ORAL | Status: DC | PRN
  Administered 2022-06-02: 650 mg via ORAL
  Filled 2022-05-26: qty 2

## 2022-05-26 MED ORDER — LACTATED RINGERS IV SOLN: INTRAVENOUS | Status: DC

## 2022-05-26 MED ORDER — ACETAMINOPHEN 500 MG PO TABS
1000.0000 mg | ORAL_TABLET | Freq: Four times a day (QID) | ORAL | Status: AC
  Administered 2022-05-26 – 2022-05-27 (×4): 1000 mg via ORAL
  Filled 2022-05-26 (×4): qty 2

## 2022-05-26 MED ORDER — VANCOMYCIN HCL 1000 MG IV SOLR
INTRAVENOUS | Status: DC | PRN
  Administered 2022-05-26: 1000 mg

## 2022-05-26 MED ORDER — METOCLOPRAMIDE HCL 5 MG/ML IJ SOLN: 5.0000 mg | Freq: Three times a day (TID) | INTRAMUSCULAR | Status: DC | PRN

## 2022-05-26 MED ORDER — METHOCARBAMOL 500 MG PO TABS
500.0000 mg | ORAL_TABLET | Freq: Four times a day (QID) | ORAL | Status: DC | PRN
  Administered 2022-05-27 – 2022-06-04 (×9): 500 mg via ORAL
  Filled 2022-05-26 (×9): qty 1

## 2022-05-26 MED ORDER — PROPOFOL 10 MG/ML IV BOLUS
INTRAVENOUS | Status: DC | PRN
  Administered 2022-05-26: 200 mg via INTRAVENOUS

## 2022-05-26 MED ORDER — CEFAZOLIN IN SODIUM CHLORIDE 3-0.9 GM/100ML-% IV SOLN
3.0000 g | Freq: Once | INTRAVENOUS | Status: AC
  Administered 2022-05-26: 3 g via INTRAVENOUS
  Filled 2022-05-26 (×2): qty 100

## 2022-05-26 MED ORDER — ONDANSETRON HCL 4 MG/2ML IJ SOLN
INTRAMUSCULAR | Status: DC | PRN
  Administered 2022-05-26: 4 mg via INTRAVENOUS

## 2022-05-26 MED ORDER — CHLORHEXIDINE GLUCONATE 0.12 % MT SOLN
15.0000 mL | Freq: Once | OROMUCOSAL | Status: AC
  Administered 2022-05-26: 15 mL via OROMUCOSAL

## 2022-05-26 MED ORDER — HYDROMORPHONE HCL 1 MG/ML IJ SOLN
0.5000 mg | INTRAMUSCULAR | Status: DC | PRN
  Administered 2022-05-29: 1 mg via INTRAVENOUS
  Filled 2022-05-26: qty 1

## 2022-05-26 MED ORDER — SODIUM CHLORIDE 0.9 % IR SOLN
Status: DC | PRN
  Administered 2022-05-26: 3000 mL

## 2022-05-26 MED ORDER — BISACODYL 10 MG RE SUPP: 10.0000 mg | Freq: Every day | RECTAL | Status: DC | PRN

## 2022-05-26 MED ORDER — METHOCARBAMOL 1000 MG/10ML IJ SOLN: 500.0000 mg | Freq: Four times a day (QID) | INTRAVENOUS | Status: DC | PRN

## 2022-05-26 MED ORDER — POVIDONE-IODINE 10 % EX SWAB
2.0000 "application " | Freq: Once | CUTANEOUS | Status: AC
  Administered 2022-05-26: 2 via TOPICAL

## 2022-05-26 MED ORDER — DOCUSATE SODIUM 100 MG PO CAPS: 100.0000 mg | ORAL_CAPSULE | Freq: Two times a day (BID) | ORAL | Status: DC

## 2022-05-26 MED ORDER — ORAL CARE MOUTH RINSE: 15.0000 mL | Freq: Once | OROMUCOSAL | Status: AC

## 2022-05-26 MED ORDER — MIDAZOLAM HCL 2 MG/2ML IJ SOLN
INTRAMUSCULAR | Status: AC
  Administered 2022-05-26: 2 mg via INTRAVENOUS
  Filled 2022-05-26: qty 2

## 2022-05-26 MED ORDER — 0.9 % SODIUM CHLORIDE (POUR BTL) OPTIME
TOPICAL | Status: DC | PRN
  Administered 2022-05-26: 1000 mL

## 2022-05-26 MED ORDER — SUGAMMADEX SODIUM 200 MG/2ML IV SOLN
INTRAVENOUS | Status: DC | PRN
  Administered 2022-05-26: 310 mg via INTRAVENOUS

## 2022-05-26 MED ORDER — FENTANYL CITRATE PF 50 MCG/ML IJ SOSY
PREFILLED_SYRINGE | INTRAMUSCULAR | Status: AC
  Administered 2022-05-26: 100 ug via INTRAVENOUS
  Filled 2022-05-26: qty 2

## 2022-05-26 MED ORDER — ONDANSETRON HCL 4 MG/2ML IJ SOLN
INTRAMUSCULAR | Status: AC
  Filled 2022-05-26: qty 2

## 2022-05-26 MED ORDER — SODIUM CHLORIDE 0.9 % IV SOLN: INTRAVENOUS | Status: DC | PRN

## 2022-05-26 MED ORDER — RIVAROXABAN 10 MG PO TABS
10.0000 mg | ORAL_TABLET | Freq: Every day | ORAL | Status: DC
  Administered 2022-05-28: 10 mg via ORAL
  Filled 2022-05-26: qty 1

## 2022-05-26 SURGICAL SUPPLY — 77 items
BAG COUNTER SPONGE SURGICOUNT (BAG) ×2 IMPLANT
BLADE SURG 15 STRL LF DISP TIS (BLADE) ×1 IMPLANT
BLADE SURG 15 STRL SS (BLADE) ×1
BNDG COHESIVE 4X5 TAN ST LF (GAUZE/BANDAGES/DRESSINGS) ×2 IMPLANT
BNDG CONFORM 6X.82 1P STRL (GAUZE/BANDAGES/DRESSINGS) ×2 IMPLANT
BNDG ELASTIC 4X5.8 VLCR STR LF (GAUZE/BANDAGES/DRESSINGS) ×2 IMPLANT
BNDG ELASTIC 6X15 VLCR STRL LF (GAUZE/BANDAGES/DRESSINGS) ×1 IMPLANT
BNDG ESMARK 4X9 LF (GAUZE/BANDAGES/DRESSINGS) ×2 IMPLANT
CHLORAPREP W/TINT 26 (MISCELLANEOUS) ×2 IMPLANT
CLSR STERI-STRIP ANTIMIC 1/2X4 (GAUZE/BANDAGES/DRESSINGS) ×2 IMPLANT
COVER BACK TABLE 60X90IN (DRAPES) ×2 IMPLANT
CUFF TOURN SGL QUICK 24 (TOURNIQUET CUFF)
CUFF TOURN SGL QUICK 34 (TOURNIQUET CUFF)
CUFF TRNQT CYL 24X4X16.5-23 (TOURNIQUET CUFF) IMPLANT
CUFF TRNQT CYL 34X4.125X (TOURNIQUET CUFF) IMPLANT
DRAPE EXTREMITY T 121X128X90 (DISPOSABLE) ×2 IMPLANT
DRAPE IMP U-DRAPE 54X76 (DRAPES) ×6 IMPLANT
DRAPE INCISE IOBAN 66X45 STRL (DRAPES) ×2 IMPLANT
DRAPE OEC MINIVIEW 54X84 (DRAPES) ×2 IMPLANT
DRAPE SHEET LG 3/4 BI-LAMINATE (DRAPES) IMPLANT
DRAPE STERI IOBAN 125X83 (DRAPES) IMPLANT
DRAPE SURG 17X23 STRL (DRAPES) IMPLANT
DRAPE U-SHAPE 47X51 STRL (DRAPES) IMPLANT
DRSG ADAPTIC 3X8 NADH LF (GAUZE/BANDAGES/DRESSINGS) ×1 IMPLANT
DRSG EMULSION OIL 3X3 NADH (GAUZE/BANDAGES/DRESSINGS) ×2 IMPLANT
DRSG MEPILEX BORDER 4X8 (GAUZE/BANDAGES/DRESSINGS) IMPLANT
DRSG PAD ABDOMINAL 8X10 ST (GAUZE/BANDAGES/DRESSINGS) ×4 IMPLANT
ELECT REM PT RETURN 15FT ADLT (MISCELLANEOUS) ×2 IMPLANT
EVACUATOR 1/8 PVC DRAIN (DRAIN) ×1 IMPLANT
GAUZE SPONGE 4X4 12PLY STRL (GAUZE/BANDAGES/DRESSINGS) ×3 IMPLANT
GAUZE XEROFORM 1X8 LF (GAUZE/BANDAGES/DRESSINGS) IMPLANT
GLOVE BIO SURGEON STRL SZ7.5 (GLOVE) ×4 IMPLANT
GLOVE BIOGEL PI IND STRL 7.5 (GLOVE) ×1 IMPLANT
GLOVE BIOGEL PI IND STRL 8 (GLOVE) ×2 IMPLANT
GLOVE BIOGEL PI INDICATOR 7.5 (GLOVE) ×1
GLOVE BIOGEL PI INDICATOR 8 (GLOVE) ×2
GLOVE SURG POLYISO LF SZ7.5 (GLOVE) ×2 IMPLANT
GOWN STRL REUS W/ TWL LRG LVL3 (GOWN DISPOSABLE) ×1 IMPLANT
GOWN STRL REUS W/TWL LRG LVL3 (GOWN DISPOSABLE) ×1
KIT BASIN OR (CUSTOM PROCEDURE TRAY) ×2 IMPLANT
KIT TURNOVER KIT A (KITS) IMPLANT
NDL HYPO 25X1 1.5 SAFETY (NEEDLE) ×1 IMPLANT
NEEDLE HYPO 25X1 1.5 SAFETY (NEEDLE) ×2 IMPLANT
NS IRRIG 1000ML POUR BTL (IV SOLUTION) ×2 IMPLANT
PACK TOTAL JOINT (CUSTOM PROCEDURE TRAY) ×2 IMPLANT
PAD CAST 4YDX4 CTTN HI CHSV (CAST SUPPLIES) ×1 IMPLANT
PADDING CAST ABS 4INX4YD NS (CAST SUPPLIES) ×1
PADDING CAST ABS COTTON 4X4 ST (CAST SUPPLIES) ×1 IMPLANT
PADDING CAST COTTON 4X4 STRL (CAST SUPPLIES) ×1
SLEEVE SCD COMPRESS KNEE MED (STOCKING) IMPLANT
SPIKE FLUID TRANSFER (MISCELLANEOUS) IMPLANT
SPONGE T-LAP 4X18 ~~LOC~~+RFID (SPONGE) ×2 IMPLANT
STOCKINETTE 4X48 STRL (DRAPES) IMPLANT
STOCKINETTE 6  STRL (DRAPES)
STOCKINETTE 6 STRL (DRAPES) IMPLANT
STOCKINETTE 8 INCH (MISCELLANEOUS) ×2 IMPLANT
SUCTION FRAZIER HANDLE 10FR (MISCELLANEOUS)
SUCTION TUBE FRAZIER 10FR DISP (MISCELLANEOUS) IMPLANT
SUT ETHILON 2 0 PS N (SUTURE) ×3 IMPLANT
SUT ETHILON 2 0 PSLX (SUTURE) ×3 IMPLANT
SUT ETHILON 3 0 PS 1 (SUTURE) IMPLANT
SUT MNCRL AB 4-0 PS2 18 (SUTURE) IMPLANT
SUT MON AB 2-0 CT1 36 (SUTURE) IMPLANT
SUT MON AB 3-0 SH 27 (SUTURE) ×1
SUT MON AB 3-0 SH27 (SUTURE) IMPLANT
SUT PROLENE 3 0 PS 2 (SUTURE) IMPLANT
SUT VIC AB 0 CT1 27 (SUTURE)
SUT VIC AB 0 CT1 27XBRD ANBCTR (SUTURE) IMPLANT
SUT VIC AB 1 CT1 36 (SUTURE) ×1 IMPLANT
SUT VIC AB 2-0 SH 27 (SUTURE)
SUT VIC AB 2-0 SH 27XBRD (SUTURE) IMPLANT
SUT VIC AB 3-0 FS2 27 (SUTURE) IMPLANT
SYR BULB EAR ULCER 3OZ GRN STR (SYRINGE) ×2 IMPLANT
SYR CONTROL 10ML LL (SYRINGE) IMPLANT
TOWEL OR NON WOVEN STRL DISP B (DISPOSABLE) ×2 IMPLANT
TUBING CONNECTING 10 (TUBING) IMPLANT
UNDERPAD 30X36 HEAVY ABSORB (UNDERPADS AND DIAPERS) ×2 IMPLANT

## 2022-05-26 NOTE — Anesthesia Procedure Notes (Signed)
Procedure Name: Intubation Date/Time: 05/26/2022 11:09 AM Performed by: Eben Burow, CRNA Pre-anesthesia Checklist: Patient identified, Emergency Drugs available, Suction available, Patient being monitored and Timeout performed Patient Re-evaluated:Patient Re-evaluated prior to induction Oxygen Delivery Method: Circle system utilized Preoxygenation: Pre-oxygenation with 100% oxygen Induction Type: IV induction Ventilation: Mask ventilation without difficulty Laryngoscope Size: Mac and 4 Grade View: Grade I Tube type: Oral Tube size: 7.5 mm Number of attempts: 1 Airway Equipment and Method: Stylet Placement Confirmation: ETT inserted through vocal cords under direct vision, positive ETCO2 and breath sounds checked- equal and bilateral Secured at: 23 cm Tube secured with: Tape Dental Injury: Teeth and Oropharynx as per pre-operative assessment

## 2022-05-26 NOTE — Interval H&P Note (Signed)
History and Physical Interval Note:  05/26/2022 9:15 AM  Todd Parrish  has presented today for surgery, with the diagnosis of hardware in right tibia.  The various methods of treatment have been discussed with the patient and family. After consideration of risks, benefits and other options for treatment, the patient has consented to  Procedure(s): HARDWARE REMOVAL (Right) as a surgical intervention.  The patient's history has been reviewed, patient examined, no change in status, stable for surgery.  I have reviewed the patient's chart and labs.  Questions were answered to the patient's satisfaction.     Sheral Apley

## 2022-05-26 NOTE — Discharge Instructions (Addendum)
Medication related instruction:    Take your Biktarvy dose once daily 2 hours before your Iron (ferrous sulfate) medication.      POST-OPERATIVE OPIOID TAPER INSTRUCTIONS: It is important to wean off of your opioid medication as soon as possible. If you do not need pain medication after your surgery it is ok to stop day one. Opioids include: Codeine, Hydrocodone(Norco, Vicodin), Oxycodone(Percocet, oxycontin) and hydromorphone amongst others.  Long term and even short term use of opiods can cause: Increased pain response Dependence Constipation Depression Respiratory depression And more.  Withdrawal symptoms can include Flu like symptoms Nausea, vomiting And more Techniques to manage these symptoms Hydrate well Eat regular healthy meals Stay active Use relaxation techniques(deep breathing, meditating, yoga) Do Not substitute Alcohol to help with tapering If you have been on opioids for less than two weeks and do not have pain than it is ok to stop all together.  Plan to wean off of opioids This plan should start within one week post op of your joint replacement. Maintain the same interval or time between taking each dose and first decrease the dose.  Cut the total daily intake of opioids by one tablet each day Next start to increase the time between doses. The last dose that should be eliminated is the evening dose.   __________________________________________________

## 2022-05-26 NOTE — Plan of Care (Signed)
  Problem: Activity: Goal: Risk for activity intolerance will decrease Outcome: Progressing   Problem: Nutrition: Goal: Adequate nutrition will be maintained Outcome: Progressing   Problem: Elimination: Goal: Will not experience complications related to bowel motility Outcome: Progressing   Problem: Pain Managment: Goal: General experience of comfort will improve Outcome: Progressing   Problem: Safety: Goal: Ability to remain free from injury will improve Outcome: Progressing   Problem: Skin Integrity: Goal: Risk for impaired skin integrity will decrease Outcome: Progressing   Problem: Education: Goal: Required Educational Video(s) Outcome: Progressing

## 2022-05-26 NOTE — Progress Notes (Signed)
Assisted Dr. Krista Blue with right, femoral, ultrasound guided Kashon Kraynak. Side rails up, monitors on throughout procedure. See vital signs in flow sheet. Tolerated Procedure well.

## 2022-05-26 NOTE — Progress Notes (Signed)
Orthopedic Tech Progress Note Patient Details:  Todd Parrish [birth year] [id number]  Patient ID: Todd Parrish, male   DOB: [birth year], 41 y.o.   MRN: [id number]  Todd Parrish 05/26/2022, 4:41 PM Right knee immobilizer applied

## 2022-05-26 NOTE — Anesthesia Procedure Notes (Signed)
Anesthesia Regional Block: Femoral nerve block   Pre-Anesthetic Checklist: , timeout performed,  Correct Patient, Correct Site, Correct Laterality,  Correct Procedure, Correct Position, site marked,  Risks and benefits discussed,  Surgical consent,  Pre-op evaluation,  At surgeon's request and post-op pain management  Laterality: Right  Prep: chloraprep       Needles:  Injection technique: Single-shot  Needle Type: Echogenic Stimulator Needle     Needle Length: 5cm  Needle Gauge: 22     Additional Needles:   Procedures:, nerve stimulator,,, ultrasound used (permanent image in chart),,     Nerve Stimulator or Paresthesia:  Response: quadraceps contraction, 0.45 mA  Additional Responses:   Narrative:  Start time: 05/26/2022 9:11 AM End time: 05/26/2022 9:21 AM Injection made incrementally with aspirations every 5 mL.  Performed by: Personally  Anesthesiologist: Heather Roberts, MD  Additional Notes: Functioning IV was confirmed and monitors were applied.  A 101mm 22ga Arrow echogenic stimulator needle was used. Sterile prep and drape,hand hygiene and sterile gloves were used. Ultrasound guidance: relevant anatomy identified, needle position confirmed, local anesthetic spread visualized around nerve(s)., vascular puncture avoided.  Image printed for medical record. Negative aspiration and negative test dose prior to incremental administration of local anesthetic. The patient tolerated the procedure well.
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
## 2022-05-26 NOTE — Progress Notes (Signed)
Pharmacy Antibiotic Note  Todd Parrish is a 41 y.o. male admitted on 05/22/2022 with  R leg cellulitis with abscess and osteo .  Pharmacy has been consulted for Vanco dosing.  ID: R leg cellulitis. Xray R tib/fib with bony irregularity in the proximal tibia at the side of indwelling hardware and possibly bony destruction in the proximal fibula. MRI right tibia/fibula with rim-enhancing fluid collection measuring 6.4 x 2.7 x 2.9 cm suspicious for abscess and possibility of the underlying infection/osteomyelitis. - Augmentin/Doxy PTA (started 5/30) - CD 4 count of 717/VL<20 (June 2023)--Biktarvy   WBC up to 17.3 > 14.1 >16.8,  CRP 24.5 > 29.7>29.9,  PCT <0.1>>0.24 up PTA Biktarvy (CD4 717, HIV RNA undetectable)  6/2 Rocephin x1 6/2 Vanc>> 6/2 Cefepime >>6/5  6/3 wound cx : MRSA 6/4 Bcx NGTD 6/4 MRSA PCR + 6/5: RPR + 6/5: Treponema Pallidum-IP  6/5: 2124 Vanco Peak 25 (dose 6/5 1720) 6/6: 0710 Vanco Trough 7 >>>AUC 466  Plan: Vancomycin 1500 mg IV q12h -con't same (AUC 466)   Height: 5\' 7"  (170.2 cm) Weight: (!) 154.2 kg (339 lb 15.2 oz) IBW/kg (Calculated) : 66.1  Temp (24hrs), Avg:98.9 F (37.2 C), Min:98.3 F (36.8 C), Max:99.6 F (37.6 C)  Recent Labs  Lab 05/19/22 1809 05/22/22 0416 05/22/22 0614 05/23/22 0138 05/24/22 0204 05/25/22 0107 05/25/22 2124 05/26/22 0710  WBC 13.1* 17.3*  --  14.1* 16.8*  --   --  19.0*  CREATININE 0.78 0.89  --  0.88 0.82 0.93  --  0.76  LATICACIDVEN  --   --  0.7  --   --   --   --   --   VANCOTROUGH  --   --   --   --   --   --   --  7*  VANCOPEAK  --   --   --   --   --   --  25*  --     Estimated Creatinine Clearance: 172.4 mL/min (by C-G formula based on SCr of 0.76 mg/dL).    Allergies  Allergen Reactions   Morphine And Related Nausea And Vomiting and Other (See Comments)    Elevated blood pressure    Pork-Derived Products Other (See Comments)    No pork for religious reasons - doesn't eat pork No pork for  religious reasons   Shellfish Allergy Other (See Comments)    No shrimp for religious reasons -doesn't eat shrimp religious puroses No shrimp for religious reasons    Holland Nickson S. 07/26/22, PharmD, BCPS Clinical Staff Pharmacist [email or web address]  Merilynn Finland 05/26/2022 10:38 AM

## 2022-05-26 NOTE — Progress Notes (Signed)
PT Cancellation Note  Patient Details Name: Todd Parrish MRN: [id number] DOB: Aug 01, 1981   Cancelled Treatment:    Reason Eval/Treat Not Completed: Patient at procedure or test/unavailable (pt is having surgery today. Will follow.)  Philomena Doheny PT 05/26/2022  Acute Rehabilitation Services Pager [phone] Office [phone]

## 2022-05-26 NOTE — Transfer of Care (Signed)
Immediate Anesthesia Transfer of Care Note  Patient: [employer]  Procedure(s) Performed: IRRIGATION AND DEBRIDEMENT RIGHT LOWER LEG WITH TIBIAL HARDWARE REMOVAL (Right)  Patient Location: PACU  Anesthesia Type:General  Level of Consciousness: awake, drowsy and patient cooperative  Airway & Oxygen Therapy: Patient Spontanous Breathing and Patient connected to face mask oxygen  Post-op Assessment: Report given to RN and Post -op Vital signs reviewed and stable  Post vital signs: Reviewed and stable  Last Vitals:  Vitals Value Taken Time  BP 110/60 05/26/22 1315  Temp    Pulse 79 05/26/22 1316  Resp    SpO2 98 % 05/26/22 1316  Vitals shown include unvalidated device data.  Last Pain:  Vitals:   05/26/22 0925  TempSrc:   PainSc: 0-No pain      Patients Stated Pain Goal: 2 ([id number])  Complications: No notable events documented.

## 2022-05-26 NOTE — Anesthesia Postprocedure Evaluation (Signed)
Anesthesia Post Note  Patient: [employer]  Procedure(s) Performed: IRRIGATION AND DEBRIDEMENT RIGHT LOWER LEG WITH TIBIAL HARDWARE REMOVAL (Right)     Patient location during evaluation: PACU Anesthesia Type: General Level of consciousness: sedated Pain management: pain level controlled Vital Signs Assessment: post-procedure vital signs reviewed and stable Respiratory status: spontaneous breathing and respiratory function stable Cardiovascular status: stable Postop Assessment: no apparent nausea or vomiting Anesthetic complications: no   No notable events documented.  Last Vitals:  Vitals:   05/26/22 1631 05/26/22 1759  BP: (!) 147/83 (!) 157/143  Pulse: 80 80  Resp: 16 16  Temp: 36.5 C 36.4 C  SpO2: 100% 98%    Last Pain:  Vitals:   05/26/22 1410  TempSrc: Oral  PainSc: 0-No pain                 Amoy Steeves DANIEL

## 2022-05-27 ENCOUNTER — Encounter (HOSPITAL_COMMUNITY): Payer: Self-pay | Admitting: Orthopedic Surgery

## 2022-05-27 DIAGNOSIS — L03115 Cellulitis of right lower limb: Secondary | ICD-10-CM | POA: Diagnosis not present

## 2022-05-27 DIAGNOSIS — B20 Human immunodeficiency virus [HIV] disease: Secondary | ICD-10-CM | POA: Diagnosis not present

## 2022-05-27 DIAGNOSIS — D72829 Elevated white blood cell count, unspecified: Secondary | ICD-10-CM | POA: Diagnosis not present

## 2022-05-27 LAB — CBC WITH DIFFERENTIAL/PLATELET
Abs Immature Granulocytes: 0.85 10*3/uL — ABNORMAL HIGH (ref 0.00–0.07)
Basophils Absolute: 0 10*3/uL (ref 0.0–0.1)
Basophils Relative: 0 %
Eosinophils Absolute: 0 10*3/uL (ref 0.0–0.5)
Eosinophils Relative: 0 %
HCT: 28.4 % — ABNORMAL LOW (ref 39.0–52.0)
Hemoglobin: 9 g/dL — ABNORMAL LOW (ref 13.0–17.0)
Immature Granulocytes: 4 %
Lymphocytes Relative: 5 %
Lymphs Abs: 1.3 10*3/uL (ref 0.7–4.0)
MCH: 24.5 pg — ABNORMAL LOW (ref 26.0–34.0)
MCHC: 31.7 g/dL (ref 30.0–36.0)
MCV: 77.4 fL — ABNORMAL LOW (ref 80.0–100.0)
Monocytes Absolute: 1.9 10*3/uL — ABNORMAL HIGH (ref 0.1–1.0)
Monocytes Relative: 8 %
Neutro Abs: 20.4 10*3/uL — ABNORMAL HIGH (ref 1.7–7.7)
Neutrophils Relative %: 83 %
Platelets: 477 10*3/uL — ABNORMAL HIGH (ref 150–400)
RBC: 3.67 MIL/uL — ABNORMAL LOW (ref 4.22–5.81)
RDW: 14.4 % (ref 11.5–15.5)
WBC: 24.5 10*3/uL — ABNORMAL HIGH (ref 4.0–10.5)
nRBC: 0 % (ref 0.0–0.2)

## 2022-05-27 LAB — CULTURE, BLOOD (ROUTINE X 2)
Culture: NO GROWTH
Culture: NO GROWTH

## 2022-05-27 LAB — COMPREHENSIVE METABOLIC PANEL
ALT: 29 U/L (ref 0–44)
AST: 30 U/L (ref 15–41)
Albumin: 2.2 g/dL — ABNORMAL LOW (ref 3.5–5.0)
Alkaline Phosphatase: 83 U/L (ref 38–126)
Anion gap: 7 (ref 5–15)
BUN: 16 mg/dL (ref 6–20)
CO2: 25 mmol/L (ref 22–32)
Calcium: 8.5 mg/dL — ABNORMAL LOW (ref 8.9–10.3)
Chloride: 104 mmol/L (ref 98–111)
Creatinine, Ser: 0.74 mg/dL (ref 0.61–1.24)
GFR, Estimated: >60 mL/min (ref >60)
Glucose, Bld: 203 mg/dL — ABNORMAL HIGH (ref 70–99)
Potassium: 4.2 mmol/L (ref 3.5–5.1)
Sodium: 136 mmol/L (ref 135–145)
Total Bilirubin: 0.4 mg/dL (ref 0.3–1.2)
Total Protein: 8.2 g/dL — ABNORMAL HIGH (ref 6.5–8.1)

## 2022-05-27 LAB — MAGNESIUM: Magnesium: 2.4 mg/dL (ref 1.7–2.4)

## 2022-05-27 LAB — BRAIN NATRIURETIC PEPTIDE: B Natriuretic Peptide: 75.3 pg/mL (ref 0.0–100.0)

## 2022-05-27 LAB — C-REACTIVE PROTEIN: CRP: 26 mg/dL — ABNORMAL HIGH (ref <1.0)

## 2022-05-27 LAB — PROCALCITONIN: Procalcitonin: <0.1 ng/mL

## 2022-05-27 MED ORDER — SODIUM CHLORIDE 0.9 % IV SOLN
2.0000 g | Freq: Three times a day (TID) | INTRAVENOUS | Status: DC
  Administered 2022-05-27 – 2022-05-29 (×6): 2 g via INTRAVENOUS
  Filled 2022-05-27 (×9): qty 12.5

## 2022-05-27 MED ORDER — ENSURE PRE-SURGERY PO LIQD
296.0000 mL | Freq: Once | ORAL | Status: AC
  Administered 2022-05-28: 296 mL via ORAL
  Filled 2022-05-27 (×2): qty 296
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
## 2022-05-27 NOTE — Progress Notes (Signed)
Todd Parrish for Infectious Disease   Reason for visit: Follow up on hardware-associated right tibia infection  Interval History: s/p operative debridement and hardware removal.  Culture from the OR yesterday with significant growth of GNR.   Previous superficial swab with MRSA, minimal growth and no WBCs on the gram stain.    Physical Exam: Constitutional:  Vitals:   05/27/22 0629 05/27/22 1000  BP: 125/71 130/67  Pulse: 69 73  Resp: 18 17  Temp: 97.7 F (36.5 C) 97.8 F (36.6 C)  SpO2: 97% 99%   patient appears in NAD Respiratory: Normal respiratory effort; CTA B Cardiovascular: RRR   Review of Systems: Constitutional: negative for fevers and chills Gastrointestinal: negative for diarrhea  Lab Results  Component Value Date   WBC 24.5 (H) 05/27/2022   HGB 9.0 (L) 05/27/2022   HCT 28.4 (L) 05/27/2022   MCV 77.4 (L) 05/27/2022   PLT 477 (H) 05/27/2022    Lab Results  Component Value Date   CREATININE 0.74 05/27/2022   BUN 16 05/27/2022   NA 136 05/27/2022   K 4.2 05/27/2022   CL 104 05/27/2022   CO2 25 05/27/2022    Lab Results  Component Value Date   ALT 29 05/27/2022   AST 30 05/27/2022   ALKPHOS 83 05/27/2022     Microbiology: Recent Results (from the past 240 hour(s))  Aerobic Culture w Gram Stain (superficial specimen)     Status: None   Collection Time: 05/22/22 10:39 AM   Specimen: Wound  Result Value Ref Range Status   Specimen Description WOUND  Final   Special Requests RIGHT LEG  Final   Gram Stain   Final    NO WBC SEEN NO ORGANISMS SEEN Performed at [facility], [address] [place], [place] [address]    Culture RARE METHICILLIN RESISTANT STAPHYLOCOCCUS AUREUS  Final   Report Status 05/25/2022 FINAL  Final   Organism ID, Bacteria METHICILLIN RESISTANT STAPHYLOCOCCUS AUREUS  Final      Susceptibility   Methicillin resistant staphylococcus aureus - MIC*    CIPROFLOXACIN >=8 RESISTANT Resistant     ERYTHROMYCIN >=8  RESISTANT Resistant     GENTAMICIN <=0.5 SENSITIVE Sensitive     OXACILLIN >=4 RESISTANT Resistant     TETRACYCLINE <=1 SENSITIVE Sensitive     VANCOMYCIN 1 SENSITIVE Sensitive     TRIMETH/SULFA >=320 RESISTANT Resistant     CLINDAMYCIN >=8 RESISTANT Resistant     RIFAMPIN <=0.5 SENSITIVE Sensitive     Inducible Clindamycin NEGATIVE Sensitive     * RARE METHICILLIN RESISTANT STAPHYLOCOCCUS AUREUS  Culture, blood (Routine X 2) w Reflex to ID Panel     Status: None   Collection Time: 05/22/22  4:28 PM   Specimen: BLOOD  Result Value Ref Range Status   Specimen Description BLOOD RIGHT ANTECUBITAL  Final   Special Requests   Final    BOTTLES DRAWN AEROBIC AND ANAEROBIC Blood Culture results may not be optimal due to an inadequate volume of blood received in culture bottles   Culture   Final    NO GROWTH 5 DAYS Performed at [facility], [place] [address] [place], [place] [address]    Report Status 05/27/2022 FINAL  Final  Culture, blood (Routine X 2) w Reflex to ID Panel     Status: None   Collection Time: 05/22/22  4:43 PM   Specimen: BLOOD LEFT HAND  Result Value Ref Range Status   Specimen Description BLOOD LEFT  HAND  Final   Special Requests   Final    BOTTLES DRAWN AEROBIC AND ANAEROBIC Blood Culture results may not be optimal due to an inadequate volume of blood received in culture bottles   Culture   Final    NO GROWTH 5 DAYS Performed at [facility], [place] [address], [place], [place] [address]    Report Status 05/27/2022 FINAL  Final  MRSA Next Gen by PCR, Nasal     Status: Abnormal   Collection Time: 05/24/22  5:48 AM   Specimen: Nasal Mucosa; Nasal Swab  Result Value Ref Range Status   MRSA by PCR Next Gen DETECTED (A) NOT DETECTED Final    Comment: RESULT CALLED TO, READ BACK BY AND VERIFIED WITH: RN Todd Parrish [phone] (NOTE) The GeneXpert MRSA Assay (FDA approved for NASAL specimens only), is one component of a comprehensive MRSA colonization  surveillance program. It is not intended to diagnose MRSA infection nor to guide or monitor treatment for MRSA infections. Test performance is not FDA approved in patients less than 63 years old. Performed at [facility], [place] [address] [place], [place] [address]   Aerobic/Anaerobic Culture w Gram Stain (surgical/deep wound)     Status: None (Preliminary result)   Collection Time: 05/26/22 11:39 AM   Specimen: PATH Other; Tissue  Result Value Ref Range Status   Specimen Description   Final    LEG RIGHT LEG FLUID Performed at [facility] [address] [place], [place] [address]    Special Requests   Final    NONE Performed at [facility], [place] [address] [place], [place] [address]    Gram Stain   Final    ABUNDANT WBC PRESENT,BOTH PMN AND MONONUCLEAR ABUNDANT GRAM NEGATIVE RODS Performed at [facility], [place] [address], [place],  [address]    Culture PENDING  Incomplete   Report Status PENDING  Incomplete    Impression/Plan:  1. 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  Previous culture from a swab likely not significant with no WBCs and minimal growth.  Will continue vancomycin though for now and monitor the cultures.  He will need prolonged antibiotics and this was discussed with him.   Will conitnue with cefepime pending ID and sensitivities  2.  Access - I discussed a picc line with him and prolonged IV antibiotics.  I will go ahead and request the picc line.  Blood cultures ng at 5 days.   3.  HIV - he continues on Biktarvy and no issues. Viral load not detected, CD4 717.  Well-controlled.    4.  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  No treatment indicated at this time.   5.  Leukocytosis - from #1 and up to 24.5 now.  Will continue to monitor.

## 2022-05-27 NOTE — H&P (View-Only) (Signed)
Subjective: Patient reports pain as moderate. Tolerating diet. Urinating. No CP, SOB. Hemovac drain contains small amount of blood. Patient reports nurse emptied it earlier. Nerve block has worn off.    Objective:   VITALS:   Vitals:   05/26/22 1759 05/26/22 2138 05/27/22 0144 05/27/22 0629  BP: (!) 157/143 120/72 138/72 125/71  Pulse: 80 83 76 69  Resp: 16 18 18 18   Temp: 97.6 F (36.4 C) 98.2 F (36.8 C) 97.6 F (36.4 C) 97.7 F (36.5 C)  TempSrc:  Oral Oral Oral  SpO2: 98% 99% 94% 97%  Weight:      Height:          Latest Ref Rng & Units 05/27/2022    3:12 AM 05/26/2022    7:10 AM 05/24/2022    2:04 AM  CBC  WBC 4.0 - 10.5 K/uL 24.5   19.0   16.8    Hemoglobin 13.0 - 17.0 g/dL 9.0   9.3   10.1    Hematocrit 39.0 - 52.0 % 28.4   30.0   31.8    Platelets 150 - 400 K/uL 477   460   402        Latest Ref Rng & Units 05/27/2022    3:12 AM 05/26/2022    7:10 AM 05/25/2022    1:07 AM  BMP  Glucose 70 - 99 mg/dL 203   138   132    BUN 6 - 20 mg/dL 16   12   9     Creatinine 0.61 - 1.24 mg/dL 0.74   0.76   0.93    Sodium 135 - 145 mmol/L 136   134   134    Potassium 3.5 - 5.1 mmol/L 4.2   3.5   3.8    Chloride 98 - 111 mmol/L 104   102   103    CO2 22 - 32 mmol/L 25   24   21     Calcium 8.9 - 10.3 mg/dL 8.5   8.2   8.2     Intake/Output      06/06 0701 06/07 0700 06/07 0701 06/08 0700   P.O. 720    I.V. (mL/kg) 140.8 (0.9)    IV Piggyback 937    Total Intake(mL/kg) 1797.8 (11.7)    Urine (mL/kg/hr) 1300 (0.4)    Blood 300    Total Output 1600    Net +197.8         Urine Occurrence 1 x       Physical Exam: General: NAD.  Sitting up in bedside chair, calm, comfortable Resp: No increased wob Cardio: regular rate and rhythm ABD soft Neurologically intact MSK Neurovascularly intact Sensation intact distally Intact pulses distally Dorsiflexion/Plantar flexion intact Incision: dressing C/D/I, no blood soaking through the bandages Drain in place with minimal  blood in hemovac and tubing KI in place. No longer needed so I removed it  Assessment: 1 Day Post-Op  S/P Procedure(s) (LRB): IRRIGATION AND DEBRIDEMENT RIGHT LOWER LEG WITH TIBIAL HARDWARE REMOVAL (Right) by Dr. Ernesta Amble. Percell Miller on 05/26/22  Principal Problem:   Cellulitis of right lower extremity Active Problems:   Human immunodeficiency virus (HIV) disease ([place])   Class 3 severe obesity due to excess calories with serious comorbidity and body mass index (BMI) of 50.0 to 59.9 in adult [facility])   Essential hypertension   Anemia of chronic disease   Cellulitis of lower extremity   Wound of right leg, initial encounter   Plan:  Will return to OR tomorrow for another I&D right leg. May try to place ABX beads within the tibial bone NPO at midnight Intra-op specimen showing abundant gram neg rods on gram stain Cultures show NGTD On Vancomycin and Cefepime per ID Up with therapy Incentive Spirometry Elevate and Apply ice  Weightbearing: WBAT RLE Insicional and dressing care: Dressings left intact until follow-up and Reinforce dressings as needed Orthopedic device(s):  KI until nerve block wore off Showering: Keep dressing dry VTE prophylaxis:  on hold since surgery again tomorrow , can restart Xarelto post-op, SCDs, ambulation Pain control: PRN Tylenol, Oxy, Dilaudid Follow - up plan:  TBD Contact information:  Edmonia Lynch MD, Aggie Moats PA-C  Dispo:  TBD . Needs to get PICC line and several weeks of IV ABX per ID. Patient claims before this he lives at home and walks without the use of assistive devices. Says they are people who will support him at home. I'd like to see him move more.      Britt Bottom, PA-C Office [phone] 05/27/2022, 8:47 AM

## 2022-05-27 NOTE — Plan of Care (Signed)
  Problem: Activity: Goal: Risk for activity intolerance will decrease Outcome: Progressing   
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
## 2022-05-27 NOTE — Op Note (Signed)
05/26/2022  8:17 AM  PATIENT:  Todd Parrish    PRE-OPERATIVE DIAGNOSIS:  hardware in right tibia  POST-OPERATIVE DIAGNOSIS:  Same  PROCEDURE:  IRRIGATION AND DEBRIDEMENT RIGHT LOWER LEG WITH TIBIAL HARDWARE REMOVAL  SURGEON:  Sheral Apley, MD  ASSISTANT: Levester Fresh, PA-C, he was present and scrubbed throughout the case, critical for completion in a timely fashion, and for retraction, instrumentation, and closure.   ANESTHESIA:   gen  PREOPERATIVE INDICATIONS:  Todd Parrish is a  41 y.o. male with a diagnosis of hardware in right tibia who failed conservative measures and elected for surgical management.    The risks benefits and alternatives were discussed with the patient preoperatively including but not limited to the risks of infection, bleeding, nerve injury, cardiopulmonary complications, the need for revision surgery, among others, and the patient was willing to proceed.  OPERATIVE IMPLANTS: none  OPERATIVE FINDINGS: purulent collection  BLOOD LOSS: 200  COMPLICATIONS: none  TOURNIQUET TIME:  OPERATIVE PROCEDURE:  Patient was identified in the preoperative holding area and site was marked by me He was transported to the operating theater and placed on the table in supine position taking care to pad all bony prominences. After a preincinduction time out anesthesia was induced. The right lower extremity was prepped and draped in normal sterile fashion and a pre-incision timeout was performed. He received ancef and vanc after culture was obtained for preoperative antibiotics.   He had a large fluid collection in his leg.  I made an incision through his previous ORIF incision and immediately a large volume of purulent fluid was expressed this tract down to his plate.  I then exposed the plate again incising through the anterior compartment fascia and then bluntly elevating anterior musculature off the plate I was able to remove all screws and the  plate came out whole  I then performed a thorough irrigation of his knee joint as well as his wound.  I exposed all areas of fluid collection he had a second pocket more distally.  I used a curette through his screw holes to confirm opening here debride bone at the site of his infection and likely osteomyelitis.  I noted another thorough irrigation was performed a drain was placed followed by 1 g of Vanco powder.  I then performed a loose closure.  POST OPERATIVE PLAN: DVT prophylaxis per primary team weightbearing as tolerated knee immobilizer full-time return to the OR likely Thursday for repeat I&D

## 2022-05-27 NOTE — Progress Notes (Signed)
Physical Therapy RE-eval  Patient Details Name: Todd Parrish MRN: [id number] DOB: 1981/06/04 Today's Date: 05/27/2022   History of Present Illness 41 year old male transferred to [facility] for I&D and hardware removal R tibia on 05/26/22. PMH: HIV, morbid obesity, ORIF of the right bicondylar plateau in 2016, chronic lymphedema of RLE presented with right lower extremity cellulitis.    PT Comments     Pt seen for mobility, agreeable to be OOB with encouragement. Pt with dizziness in sitting, BP 156/82. Pt became diaphoretic amb short distance to chair, BP 107/57 after seated in recliner. RN aware.  Recommendations for follow up therapy are one component of a multi-disciplinary discharge planning process, led by the attending physician.  Recommendations may be updated based on patient status, additional functional criteria and insurance authorization.  Follow Up Recommendations  No PT follow up     Assistance Recommended at Discharge PRN  Patient can return home with the following Assistance with cooking/housework;Assist for transportation;Help with stairs or ramp for entrance   Equipment Recommendations  None recommended by PT    Recommendations for Other Services       Precautions / Restrictions Precautions Precautions: Fall Precaution Comments: KI until nerve block wears off, hemovac Required Braces or Orthoses: Knee Immobilizer - Right Knee Immobilizer - Right: Other (comment) Restrictions Weight Bearing Restrictions: No Other Position/Activity Restrictions: WBAT     Mobility  Bed Mobility Overal bed mobility: Needs Assistance Bed Mobility: Supine to Sit     Supine to sit: Min assist, HOB elevated     General bed mobility comments: assist to lower RLE to floor,pt uses LLE to self assist RLE    Transfers Overall transfer level: Needs assistance Equipment used: Rolling walker (2 wheels) Transfers: Sit to/from Stand Sit to Stand: Min assist, +2 safety/equipment,  Min guard, From elevated surface           General transfer comment: light assist to come to standing and transition to RW.    Ambulation/Gait Ambulation/Gait assistance: Min guard, +2 physical assistance, +2 safety/equipment, Min assist Gait Distance (Feet): 15 Feet Assistive device: Rolling walker (2 wheels) Gait Pattern/deviations: Step-through pattern, Decreased stride length, Decreased weight shift to right, Wide base of support Gait velocity: decreased     General Gait Details: min to min-guard Assist for balance and safety. Slower but stable with AD. No buckling noted, good control of RW. limited by fatigue   Stairs             Wheelchair Mobility    Modified Rankin (Stroke Patients Only)       Balance                                            Cognition Arousal/Alertness: Awake/alert Behavior During Therapy: WFL for tasks assessed/performed Overall Cognitive Status: Within Functional Limits for tasks assessed                                          Exercises General Exercises - Lower Extremity Ankle Circles/Pumps: AROM, Both, 15 reps    General Comments        Pertinent Vitals/Pain Pain Assessment Pain Assessment: No/denies pain    Home Living  Prior Function            PT Goals (current goals can now be found in the care plan section) Acute Rehab PT Goals Patient Stated Goal: Get well and go home PT Goal Formulation: With patient Time For Goal Achievement: 06/06/22 Potential to Achieve Goals: Good Progress towards PT goals: Progressing toward goals    Frequency    Min 3X/week      PT Plan Current plan remains appropriate    Co-evaluation              AM-PAC PT "6 Clicks" Mobility   Outcome Measure  Help needed turning from your back to your side while in a flat bed without using bedrails?: A Little Help needed moving from lying on your back to  sitting on the side of a flat bed without using bedrails?: A Little Help needed moving to and from a bed to a chair (including a wheelchair)?: A Lot Help needed standing up from a chair using your arms (e.g., wheelchair or bedside chair)?: A Lot Help needed to walk in hospital room?: A Lot Help needed climbing 3-5 steps with a railing? : Total 6 Click Score: 13    End of Session Equipment Utilized During Treatment: Gait belt;Right knee immobilizer Activity Tolerance: Patient limited by fatigue Patient left: in chair;with call bell/phone within reach;with chair alarm set Nurse Communication: Other (comment) (needs bari chair) PT Visit Diagnosis: Other abnormalities of gait and mobility (R26.89);Pain Pain - Right/Left: Right Pain - part of body: Knee     Time: 2563-8937 PT Time Calculation (min) (ACUTE ONLY): 37 min  Charges:  $Gait Training: 8-22 mins $Therapeutic Activity: 8-22 mins                     Delice Bison, PT  Acute Rehab Dept ([facility]) [phone] Pager [phone]  05/27/2022    Drucilla Chalet 05/27/2022, 1:22 PM

## 2022-05-27 NOTE — Progress Notes (Signed)
OT Cancellation Note  Patient Details Name: Todd Parrish MRN: [id number] DOB: Dec 31, 1980   Cancelled Treatment:    Reason Eval/Treat Not Completed: Other (comment) Patient was in a prayer session at this time. OT to continue to follow and check back as schedule will allow. Sharyn Blitz OTR/L, MS Acute Rehabilitation Department Office# [phone] Pager# [phone]  05/27/2022, 2:32 PM

## 2022-05-27 NOTE — Progress Notes (Signed)
  Subjective: Patient reports pain as moderate. Tolerating diet. Urinating. No CP, SOB. Hemovac drain contains small amount of blood. Patient reports nurse emptied it earlier. Nerve block has worn off.    Objective:   VITALS:   Vitals:   05/26/22 1759 05/26/22 2138 05/27/22 0144 05/27/22 0629  BP: (!) 157/143 120/72 138/72 125/71  Pulse: 80 83 76 69  Resp: 16 18 18 18  Temp: 97.6 F (36.4 C) 98.2 F (36.8 C) 97.6 F (36.4 C) 97.7 F (36.5 C)  TempSrc:  Oral Oral Oral  SpO2: 98% 99% 94% 97%  Weight:      Height:          Latest Ref Rng & Units 05/27/2022    3:12 AM 05/26/2022    7:10 AM 05/24/2022    2:04 AM  CBC  WBC 4.0 - 10.5 K/uL 24.5   19.0   16.8    Hemoglobin 13.0 - 17.0 g/dL 9.0   9.3   10.1    Hematocrit 39.0 - 52.0 % 28.4   30.0   31.8    Platelets 150 - 400 K/uL 477   460   402        Latest Ref Rng & Units 05/27/2022    3:12 AM 05/26/2022    7:10 AM 05/25/2022    1:07 AM  BMP  Glucose 70 - 99 mg/dL 203   138   132    BUN 6 - 20 mg/dL 16   12   9    Creatinine 0.61 - 1.24 mg/dL 0.74   0.76   0.93    Sodium 135 - 145 mmol/L 136   134   134    Potassium 3.5 - 5.1 mmol/L 4.2   3.5   3.8    Chloride 98 - 111 mmol/L 104   102   103    CO2 22 - 32 mmol/L 25   24   21    Calcium 8.9 - 10.3 mg/dL 8.5   8.2   8.2     Intake/Output      06/06 0701 06/07 0700 06/07 0701 06/08 0700   P.O. 720    I.V. (mL/kg) 140.8 (0.9)    IV Piggyback 937    Total Intake(mL/kg) 1797.8 (11.7)    Urine (mL/kg/hr) 1300 (0.4)    Blood 300    Total Output 1600    Net +197.8         Urine Occurrence 1 x       Physical Exam: General: NAD.  Sitting up in bedside chair, calm, comfortable Resp: No increased wob Cardio: regular rate and rhythm ABD soft Neurologically intact MSK Neurovascularly intact Sensation intact distally Intact pulses distally Dorsiflexion/Plantar flexion intact Incision: dressing C/D/I, no blood soaking through the bandages Drain in place with minimal  blood in hemovac and tubing KI in place. No longer needed so I removed it  Assessment: 1 Day Post-Op  S/P Procedure(s) (LRB): IRRIGATION AND DEBRIDEMENT RIGHT LOWER LEG WITH TIBIAL HARDWARE REMOVAL (Right) by Dr. Timothy D. Murphy on 05/26/22  Principal Problem:   Cellulitis of right lower extremity Active Problems:   Human immunodeficiency virus (HIV) disease (HCC)   Class 3 severe obesity due to excess calories with serious comorbidity and body mass index (BMI) of 50.0 to 59.9 in adult (HCC)   Essential hypertension   Anemia of chronic disease   Cellulitis of lower extremity   Wound of right leg, initial encounter   Plan:   Will return to OR tomorrow for another I&D right leg. May try to place ABX beads within the tibial bone NPO at midnight Intra-op specimen showing abundant gram neg rods on gram stain Cultures show NGTD On Vancomycin and Cefepime per ID Up with therapy Incentive Spirometry Elevate and Apply ice  Weightbearing: WBAT RLE Insicional and dressing care: Dressings left intact until follow-up and Reinforce dressings as needed Orthopedic device(s):  KI until nerve block wore off Showering: Keep dressing dry VTE prophylaxis:  on hold since surgery again tomorrow , can restart Xarelto post-op, SCDs, ambulation Pain control: PRN Tylenol, Oxy, Dilaudid Follow - up plan:  TBD Contact information:  Timothy Murphy MD, Keri Tavella PA-C  Dispo:  TBD . Needs to get PICC line and several weeks of IV ABX per ID. Patient claims before this he lives at home and walks without the use of assistive devices. Says they are people who will support him at home. I'd like to see him move more.      Lakoda Mcanany M Calise Dunckel, PA-C Office [phone] 05/27/2022, 8:47 AM  

## 2022-05-28 ENCOUNTER — Encounter (HOSPITAL_COMMUNITY): Payer: Self-pay | Admitting: Internal Medicine

## 2022-05-28 ENCOUNTER — Other Ambulatory Visit: Payer: Self-pay

## 2022-05-28 ENCOUNTER — Encounter (HOSPITAL_COMMUNITY)
Admission: EM | Disposition: A | Discharge status: Home Health | Payer: Self-pay | Source: Home / Self Care | Attending: Internal Medicine

## 2022-05-28 ENCOUNTER — Inpatient Hospital Stay (HOSPITAL_COMMUNITY): Payer: Medicaid Other | Admitting: Anesthesiology

## 2022-05-28 DIAGNOSIS — I10 Essential (primary) hypertension: Secondary | ICD-10-CM | POA: Diagnosis not present

## 2022-05-28 DIAGNOSIS — T847XXA Infection and inflammatory reaction due to other internal orthopedic prosthetic devices, implants and grafts, initial encounter: Secondary | ICD-10-CM | POA: Diagnosis not present

## 2022-05-28 DIAGNOSIS — M869 Osteomyelitis, unspecified: Secondary | ICD-10-CM | POA: Diagnosis not present

## 2022-05-28 DIAGNOSIS — L03115 Cellulitis of right lower limb: Secondary | ICD-10-CM | POA: Diagnosis not present

## 2022-05-28 DIAGNOSIS — T84622A Infection and inflammatory reaction due to internal fixation device of right tibia, initial encounter: Secondary | ICD-10-CM

## 2022-05-28 DIAGNOSIS — Z6841 Body Mass Index (BMI) 40.0 and over, adult: Secondary | ICD-10-CM | POA: Diagnosis not present

## 2022-05-28 DIAGNOSIS — D649 Anemia, unspecified: Secondary | ICD-10-CM | POA: Diagnosis not present

## 2022-05-28 HISTORY — PX: INCISION AND DRAINAGE ABSCESS: SHX5864

## 2022-05-28 LAB — CBC WITH DIFFERENTIAL/PLATELET
Abs Immature Granulocytes: 1.01 10*3/uL — ABNORMAL HIGH (ref 0.00–0.07)
Basophils Absolute: 0.1 10*3/uL (ref 0.0–0.1)
Basophils Relative: 1 %
Eosinophils Absolute: 0.1 10*3/uL (ref 0.0–0.5)
Eosinophils Relative: 1 %
HCT: 28.9 % — ABNORMAL LOW (ref 39.0–52.0)
Hemoglobin: 8.4 g/dL — ABNORMAL LOW (ref 13.0–17.0)
Immature Granulocytes: 6 %
Lymphocytes Relative: 18 %
Lymphs Abs: 3.3 10*3/uL (ref 0.7–4.0)
MCH: 23.9 pg — ABNORMAL LOW (ref 26.0–34.0)
MCHC: 29.1 g/dL — ABNORMAL LOW (ref 30.0–36.0)
MCV: 82.1 fL (ref 80.0–100.0)
Monocytes Absolute: 1.2 10*3/uL — ABNORMAL HIGH (ref 0.1–1.0)
Monocytes Relative: 7 %
Neutro Abs: 12.4 10*3/uL — ABNORMAL HIGH (ref 1.7–7.7)
Neutrophils Relative %: 67 %
Platelets: 380 10*3/uL (ref 150–400)
RBC: 3.52 MIL/uL — ABNORMAL LOW (ref 4.22–5.81)
RDW: 14.7 % (ref 11.5–15.5)
WBC: 18.2 10*3/uL — ABNORMAL HIGH (ref 4.0–10.5)
nRBC: 0 % (ref 0.0–0.2)

## 2022-05-28 LAB — MAGNESIUM: Magnesium: 2.2 mg/dL (ref 1.7–2.4)

## 2022-05-28 LAB — COMPREHENSIVE METABOLIC PANEL
ALT: 36 U/L (ref 0–44)
AST: 33 U/L (ref 15–41)
Albumin: 2.2 g/dL — ABNORMAL LOW (ref 3.5–5.0)
Alkaline Phosphatase: 72 U/L (ref 38–126)
Anion gap: 9 (ref 5–15)
BUN: 18 mg/dL (ref 6–20)
CO2: 25 mmol/L (ref 22–32)
Calcium: 7.8 mg/dL — ABNORMAL LOW (ref 8.9–10.3)
Chloride: 102 mmol/L (ref 98–111)
Creatinine, Ser: 0.75 mg/dL (ref 0.61–1.24)
GFR, Estimated: >60 mL/min (ref >60)
Glucose, Bld: 180 mg/dL — ABNORMAL HIGH (ref 70–99)
Potassium: 3.9 mmol/L (ref 3.5–5.1)
Sodium: 136 mmol/L (ref 135–145)
Total Bilirubin: 0.4 mg/dL (ref 0.3–1.2)
Total Protein: 7.4 g/dL (ref 6.5–8.1)

## 2022-05-28 LAB — FERRITIN: Ferritin: 400 ng/mL — ABNORMAL HIGH (ref 24–336)

## 2022-05-28 LAB — C-REACTIVE PROTEIN: CRP: 9.8 mg/dL — ABNORMAL HIGH (ref <1.0)

## 2022-05-28 LAB — PROCALCITONIN: Procalcitonin: 0.1 ng/mL

## 2022-05-28 LAB — BRAIN NATRIURETIC PEPTIDE: B Natriuretic Peptide: 27.2 pg/mL (ref 0.0–100.0)

## 2022-05-28 SURGERY — IRRIGATION AND DEBRIDEMENT EXTREMITY
Anesthesia: General | Laterality: Right

## 2022-05-28 SURGERY — INCISION AND DRAINAGE, ABSCESS
Anesthesia: General | Laterality: Right

## 2022-05-28 MED ORDER — VANCOMYCIN HCL 1000 MG IV SOLR
INTRAVENOUS | Status: DC | PRN
  Administered 2022-05-28: 3000 mg via TOPICAL

## 2022-05-28 MED ORDER — OXYCODONE HCL 5 MG PO TABS: 5.0000 mg | ORAL_TABLET | Freq: Once | ORAL | Status: DC | PRN

## 2022-05-28 MED ORDER — LACTATED RINGERS IV SOLN: INTRAVENOUS | Status: DC | PRN

## 2022-05-28 MED ORDER — ACETAMINOPHEN 10 MG/ML IV SOLN
1000.0000 mg | Freq: Once | INTRAVENOUS | Status: DC | PRN
Start: 2022-05-28 — End: 2022-05-28

## 2022-05-28 MED ORDER — ONDANSETRON HCL 4 MG PO TABS
4.0000 mg | ORAL_TABLET | Freq: Four times a day (QID) | ORAL | Status: DC | PRN
Start: 2022-05-28 — End: 2022-06-03

## 2022-05-28 MED ORDER — LIDOCAINE HCL (CARDIAC) PF 100 MG/5ML IV SOSY
PREFILLED_SYRINGE | INTRAVENOUS | Status: DC | PRN
  Administered 2022-05-28: 100 mg via INTRAVENOUS

## 2022-05-28 MED ORDER — MIDAZOLAM HCL 2 MG/2ML IJ SOLN
INTRAMUSCULAR | Status: AC
  Filled 2022-05-28: qty 2

## 2022-05-28 MED ORDER — HYDROMORPHONE HCL 1 MG/ML IJ SOLN
0.2500 mg | INTRAMUSCULAR | Status: DC | PRN
  Administered 2022-05-28: 0.5 mg via INTRAVENOUS

## 2022-05-28 MED ORDER — MIDAZOLAM HCL 5 MG/5ML IJ SOLN
INTRAMUSCULAR | Status: DC | PRN
  Administered 2022-05-28: 2 mg via INTRAVENOUS

## 2022-05-28 MED ORDER — POVIDONE-IODINE 10 % EX SWAB: 2.0000 "application " | Freq: Once | CUTANEOUS | Status: DC

## 2022-05-28 MED ORDER — METOCLOPRAMIDE HCL 5 MG/ML IJ SOLN: 5.0000 mg | Freq: Three times a day (TID) | INTRAMUSCULAR | Status: DC | PRN

## 2022-05-28 MED ORDER — BUPIVACAINE-EPINEPHRINE (PF) 0.25% -1:200000 IJ SOLN
INTRAMUSCULAR | Status: AC
  Filled 2022-05-28: qty 30

## 2022-05-28 MED ORDER — VANCOMYCIN HCL 1000 MG IV SOLR
INTRAVENOUS | Status: AC
Start: 2022-05-28 — End: ?
  Filled 2022-05-28: qty 40

## 2022-05-28 MED ORDER — ONDANSETRON HCL 4 MG/2ML IJ SOLN
INTRAMUSCULAR | Status: DC | PRN
  Administered 2022-05-28: 4 mg via INTRAVENOUS

## 2022-05-28 MED ORDER — CEFAZOLIN IN SODIUM CHLORIDE 3-0.9 GM/100ML-% IV SOLN
3.0000 g | INTRAVENOUS | Status: AC
  Administered 2022-05-29: 3 g via INTRAVENOUS
  Filled 2022-05-28 (×2): qty 100

## 2022-05-28 MED ORDER — VANCOMYCIN HCL 1000 MG IV SOLR
INTRAVENOUS | Status: AC
  Filled 2022-05-28: qty 20

## 2022-05-28 MED ORDER — FENTANYL CITRATE (PF) 250 MCG/5ML IJ SOLN
INTRAMUSCULAR | Status: AC
  Filled 2022-05-28: qty 5

## 2022-05-28 MED ORDER — DEXAMETHASONE SODIUM PHOSPHATE 10 MG/ML IJ SOLN
INTRAMUSCULAR | Status: DC | PRN
  Administered 2022-05-28: 10 mg via INTRAVENOUS

## 2022-05-28 MED ORDER — PROPOFOL 10 MG/ML IV BOLUS
INTRAVENOUS | Status: DC | PRN
  Administered 2022-05-28: 250 mg via INTRAVENOUS

## 2022-05-28 MED ORDER — SODIUM CHLORIDE 0.9 % IR SOLN
Status: DC | PRN
  Administered 2022-05-28: 1000 mL
  Administered 2022-05-28: 3000 mL

## 2022-05-28 MED ORDER — POVIDONE-IODINE 10 % EX SWAB
2.0000 "application " | Freq: Once | CUTANEOUS | Status: AC
  Administered 2022-05-29: 2 via TOPICAL

## 2022-05-28 MED ORDER — ACETAMINOPHEN 500 MG PO TABS: 1000.0000 mg | ORAL_TABLET | Freq: Once | ORAL | Status: DC

## 2022-05-28 MED ORDER — DEXAMETHASONE SODIUM PHOSPHATE 10 MG/ML IJ SOLN: 8.0000 mg | Freq: Once | INTRAMUSCULAR | Status: DC

## 2022-05-28 MED ORDER — HYDROMORPHONE HCL 1 MG/ML IJ SOLN: 0.2500 mg | INTRAMUSCULAR | Status: DC | PRN

## 2022-05-28 MED ORDER — DEXMEDETOMIDINE (PRECEDEX) IN NS 20 MCG/5ML (4 MCG/ML) IV SYRINGE
PREFILLED_SYRINGE | INTRAVENOUS | Status: DC | PRN
  Administered 2022-05-28: 8 ug via INTRAVENOUS
  Administered 2022-05-28: 12 ug via INTRAVENOUS

## 2022-05-28 MED ORDER — CHLORHEXIDINE GLUCONATE 4 % EX LIQD
60.0000 mL | Freq: Once | CUTANEOUS | Status: DC
  Filled 2022-05-28: qty 15

## 2022-05-28 MED ORDER — ONDANSETRON HCL 4 MG/2ML IJ SOLN
4.0000 mg | Freq: Four times a day (QID) | INTRAMUSCULAR | Status: DC | PRN
Start: 2022-05-28 — End: 2022-06-03

## 2022-05-28 MED ORDER — HYDROMORPHONE HCL 1 MG/ML IJ SOLN
INTRAMUSCULAR | Status: AC
  Administered 2022-05-28: 0.5 mg via INTRAVENOUS
  Filled 2022-05-28: qty 2

## 2022-05-28 MED ORDER — OXYCODONE HCL 5 MG/5ML PO SOLN: 5.0000 mg | Freq: Once | ORAL | Status: DC | PRN

## 2022-05-28 MED ORDER — METOCLOPRAMIDE HCL 5 MG PO TABS: 5.0000 mg | ORAL_TABLET | Freq: Three times a day (TID) | ORAL | Status: DC | PRN

## 2022-05-28 MED ORDER — ONDANSETRON HCL 4 MG/2ML IJ SOLN: 4.0000 mg | Freq: Once | INTRAMUSCULAR | Status: DC | PRN

## 2022-05-28 MED ORDER — FENTANYL CITRATE (PF) 100 MCG/2ML IJ SOLN
INTRAMUSCULAR | Status: DC | PRN
  Administered 2022-05-28: 100 ug via INTRAVENOUS
  Administered 2022-05-28 (×3): 50 ug via INTRAVENOUS

## 2022-05-28 SURGICAL SUPPLY — 55 items
BAG COUNTER SPONGE SURGICOUNT (BAG) ×2 IMPLANT
BANDAGE ESMARK 6X9 LF (GAUZE/BANDAGES/DRESSINGS) IMPLANT
BLADE SURG SZ10 CARB STEEL (BLADE) ×2 IMPLANT
BNDG COHESIVE 4X5 TAN ST LF (GAUZE/BANDAGES/DRESSINGS) ×2 IMPLANT
BNDG ELASTIC 4X5.8 VLCR STR LF (GAUZE/BANDAGES/DRESSINGS) ×2 IMPLANT
BNDG ELASTIC 6X5.8 VLCR STR LF (GAUZE/BANDAGES/DRESSINGS) ×2 IMPLANT
BNDG ESMARK 4X9 LF (GAUZE/BANDAGES/DRESSINGS) IMPLANT
BNDG ESMARK 6X9 LF (GAUZE/BANDAGES/DRESSINGS)
BNDG GAUZE ELAST 4 BULKY (GAUZE/BANDAGES/DRESSINGS) ×2 IMPLANT
CNTNR URN SCR LID CUP LEK RST (MISCELLANEOUS) IMPLANT
CONT SPEC 4OZ STRL OR WHT (MISCELLANEOUS) ×1
COVER SURGICAL LIGHT HANDLE (MISCELLANEOUS) ×2 IMPLANT
CUFF TOURN SGL QUICK 34 (TOURNIQUET CUFF)
CUFF TRNQT CYL 34X4.125X (TOURNIQUET CUFF) IMPLANT
DRAPE SURG 17X23 STRL (DRAPES) IMPLANT
DRAPE U-SHAPE 47X51 STRL (DRAPES) IMPLANT
DRSG PAD ABDOMINAL 8X10 ST (GAUZE/BANDAGES/DRESSINGS) ×2 IMPLANT
DRSG VAC ATS MED SENSATRAC (GAUZE/BANDAGES/DRESSINGS) ×1 IMPLANT
DURAPREP 26ML APPLICATOR (WOUND CARE) ×2 IMPLANT
ELECT REM PT RETURN 15FT ADLT (MISCELLANEOUS) IMPLANT
EVACUATOR 1/8 PVC DRAIN (DRAIN) IMPLANT
FACESHIELD WRAPAROUND (MASK) ×2 IMPLANT
FACESHIELD WRAPAROUND OR TEAM (MASK) ×1 IMPLANT
GAUZE SPONGE 4X4 12PLY STRL (GAUZE/BANDAGES/DRESSINGS) ×2 IMPLANT
GAUZE XEROFORM 1X8 LF (GAUZE/BANDAGES/DRESSINGS) ×2 IMPLANT
GLOVE BIO SURGEON STRL SZ7.5 (GLOVE) ×4 IMPLANT
GLOVE BIOGEL PI IND STRL 7.5 (GLOVE) ×1 IMPLANT
GLOVE BIOGEL PI IND STRL 8 (GLOVE) ×2 IMPLANT
GLOVE BIOGEL PI INDICATOR 7.5 (GLOVE) ×1
GLOVE BIOGEL PI INDICATOR 8 (GLOVE) ×2
GOWN STRL REUS W/ TWL LRG LVL3 (GOWN DISPOSABLE) ×1 IMPLANT
GOWN STRL REUS W/TWL LRG LVL3 (GOWN DISPOSABLE) ×1
HANDPIECE INTERPULSE COAX TIP (DISPOSABLE)
KIT BASIN OR (CUSTOM PROCEDURE TRAY) ×2 IMPLANT
KIT STIMULAN RAPID CURE  10CC (Orthopedic Implant) IMPLANT
KIT STIMULAN RAPID CURE 10CC (Orthopedic Implant) IMPLANT
KIT TURNOVER KIT A (KITS) IMPLANT
MANIFOLD NEPTUNE II (INSTRUMENTS) ×2 IMPLANT
NDL HYPO 25X1 1.5 SAFETY (NEEDLE) IMPLANT
NEEDLE HYPO 25X1 1.5 SAFETY (NEEDLE) IMPLANT
NS IRRIG 1000ML POUR BTL (IV SOLUTION) ×2 IMPLANT
PACK ORTHO EXTREMITY (CUSTOM PROCEDURE TRAY) ×2 IMPLANT
PAD ARMBOARD 7.5X6 YLW CONV (MISCELLANEOUS) ×4 IMPLANT
SET HNDPC FAN SPRY TIP SCT (DISPOSABLE) IMPLANT
SET IRRIG Y TYPE TUR BLADDER L (SET/KITS/TRAYS/PACK) IMPLANT
STOCKINETTE 6  STRL (DRAPES) ×1
STOCKINETTE 6 STRL (DRAPES) ×1 IMPLANT
SUT ETHILON 3 0 PS 1 (SUTURE) IMPLANT
SUT PDS AB 2-0 CT2 27 (SUTURE) IMPLANT
SWAB CULTURE ESWAB REG 1ML (MISCELLANEOUS) IMPLANT
SYR CONTROL 10ML LL (SYRINGE) IMPLANT
TOWEL OR 17X26 10 PK STRL BLUE (TOWEL DISPOSABLE) ×4 IMPLANT
TUBING CONNECTING 10 (TUBING) ×2 IMPLANT
UNDERPAD 30X36 HEAVY ABSORB (UNDERPADS AND DIAPERS) ×2 IMPLANT
YANKAUER SUCT BULB TIP NO VENT (SUCTIONS) ×2 IMPLANT

## 2022-05-28 NOTE — Transfer of Care (Signed)
Immediate Anesthesia Transfer of Care Note  Patient: [employer]  Procedure(s) Performed: INCISION AND DRAINAGE ABSCESS (Right)  Patient Location: PACU  Anesthesia Type:General  Level of Consciousness: awake, alert , oriented, drowsy and patient cooperative  Airway & Oxygen Therapy: Patient Spontanous Breathing and Patient connected to face mask oxygen  Post-op Assessment: Report given to RN, Post -op Vital signs reviewed and stable and Patient moving all extremities X 4  Post vital signs: Reviewed and stable  Last Vitals:  Vitals Value Taken Time  BP    Temp    Pulse 82 05/28/22 0857  Resp 14 05/28/22 0857  SpO2 100 % 05/28/22 0857  Vitals shown include unvalidated device data.  Last Pain:  Vitals:   05/28/22 0609  TempSrc: Oral  PainSc:       Patients Stated Pain Goal: 1 (05/28/22 0247)  Complications: No notable events documented.

## 2022-05-28 NOTE — Anesthesia Postprocedure Evaluation (Signed)
Anesthesia Post Note  Patient: Education officer, museum  Procedure(s) Performed: INCISION AND DRAINAGE ABSCESS (Right)     Patient location during evaluation: PACU Anesthesia Type: General Level of consciousness: awake and alert Pain management: pain level controlled Vital Signs Assessment: post-procedure vital signs reviewed and stable Respiratory status: spontaneous breathing, nonlabored ventilation, respiratory function stable and patient connected to nasal cannula oxygen Cardiovascular status: blood pressure returned to baseline and stable Postop Assessment: no apparent nausea or vomiting Anesthetic complications: no   No notable events documented.  Last Vitals:  Vitals:   05/28/22 0854 05/28/22 0900  BP: (!) 143/90 (!) 143/86  Pulse: 80 82  Resp: 13 16  Temp: 36.4 C   SpO2: 100% 99%    Last Pain:  Vitals:   05/28/22 0900  TempSrc:   PainSc: Asleep                 Artavius Stearns S

## 2022-05-28 NOTE — TOC Progression Note (Signed)
Transition of Care [facility]) - Progression Note    Patient Details  Name: Todd Parrish MRN: [id number] Date of Birth: 01/02/1981  [facility] CM/SW Contact  Amada Jupiter, LCSW Phone Number: 05/28/2022, 2:02 PM  Clinical Narrative:     Pt now planned for transfer to [facility].  Have alerted [facility] who will be assisting with the home IV abx management.  Handoff in place for oncoming TOC staff.  Expected Discharge Plan: Home w Home Health Services Barriers to Discharge: Continued Medical Work up  Expected Discharge Plan and Services Expected Discharge Plan: Home w Home Health Services In-house Referral: Clinical Social Work   Post Acute Care Choice: Home Health                             HH Arranged: RN, IV Antibiotics HH Agency: Other - See comment (HHRN via [facility];  IV abx via [facility])         Social Determinants of Health (SDOH) Interventions    Readmission Risk Interventions     No data to display

## 2022-05-28 NOTE — Progress Notes (Signed)
OT Cancellation Note  Patient Details Name: Todd Parrish MRN: [id number] DOB: 03-16-1981   Cancelled Treatment:    Reason Eval/Treat Not Completed: Patient at procedure or test/ unavailable (surgery)  Elnore Cosens,HILLARY 05/28/2022, 6:36 AM Luisa Dago, OT/L   Acute OT Clinical Specialist Acute Rehabilitation Services Pager [phone] Office [phone]

## 2022-05-28 NOTE — Anesthesia Procedure Notes (Signed)
Procedure Name: LMA Insertion Date/Time: 05/28/2022 7:42 AM  Performed by: Shanon Payor, CRNAPre-anesthesia Checklist: Patient identified, Emergency Drugs available, Suction available, Patient being monitored and Timeout performed Patient Re-evaluated:Patient Re-evaluated prior to induction Oxygen Delivery Method: Circle system utilized Preoxygenation: Pre-oxygenation with 100% oxygen Induction Type: IV induction LMA: LMA with gastric port inserted LMA Size: 5.0 Number of attempts: 1 Placement Confirmation: positive ETCO2 and breath sounds checked- equal and bilateral Tube secured with: Tape Dental Injury: Teeth and Oropharynx as per pre-operative assessment

## 2022-05-28 NOTE — Progress Notes (Signed)
PT Cancellation Note  Patient Details Name: Todd Parrish MRN: [id number] DOB: 03/30/1981   Cancelled Treatment:    Reason Eval/Treat Not Completed: Patient at procedure or test/unavailable (Pt is in surgery.) Pt is currently receiving surgery for I&D of abscess. Will follow up as schedule and pt status allows.    Jamesetta Geralds, PT, DPT [facility] Rehabilitation Department Office: [phone] Pager: [phone]    Jamesetta Geralds 05/28/2022, 9:18 AM

## 2022-05-28 NOTE — Progress Notes (Signed)
Called [facility] will be here to transport this evening. Report given to nurse on 5N.

## 2022-05-28 NOTE — Progress Notes (Signed)
PT Cancellation Note  Patient Details Name: Todd Parrish MRN: [id number] DOB: 1981/03/23   Cancelled Treatment:    Reason Eval/Treat Not Completed: Patient declined, no reason specified (Pt frustrated, in pain, and unwilling to mobilize despite encouragement.). Will check back as schedule allows, which may be tomorrow, if pt remains on [facility] acute caseload.  Coolidge Breeze, PT, DPT [place] Rehabilitation Department Office: [phone] Pager: [phone]    Coolidge Breeze 05/28/2022, 3:59 PM

## 2022-05-28 NOTE — Interval H&P Note (Signed)
History and Physical Interval Note:  05/28/2022 6:56 AM  Todd Parrish  has presented today for surgery, with the diagnosis of hardware in right tibia.  The various methods of treatment have been discussed with the patient and family. After consideration of risks, benefits and other options for treatment, the patient has consented to  Procedure(s): INCISION AND DRAINAGE ABSCESS (Right) as a surgical intervention.  The patient's history has been reviewed, patient examined, no change in status, stable for surgery.  I have reviewed the patient's chart and labs.  Questions were answered to the patient's satisfaction.     Sheral Apley
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
## 2022-05-28 NOTE — Anesthesia Preprocedure Evaluation (Signed)
Anesthesia Evaluation  Patient identified by MRN, date of birth, ID band Patient awake    Reviewed: Allergy & Precautions, NPO status , Patient's Chart, lab work & pertinent test results  Airway Mallampati: II  TM Distance: >3 FB Neck ROM: Full    Dental no notable dental hx.    Pulmonary neg pulmonary ROS,    Pulmonary exam normal breath sounds clear to auscultation       Cardiovascular hypertension, Normal cardiovascular exam Rhythm:Regular Rate:Normal     Neuro/Psych negative neurological ROS  negative psych ROS   GI/Hepatic Neg liver ROS, GERD  ,  Endo/Other  Morbid obesity  Renal/GU negative Renal ROS  negative genitourinary   Musculoskeletal negative musculoskeletal ROS (+)   Abdominal   Peds negative pediatric ROS (+)  Hematology  (+) Blood dyscrasia, anemia , HIV,   Anesthesia Other Findings   Reproductive/Obstetrics negative OB ROS                             Anesthesia Physical Anesthesia Plan  ASA: 4  Anesthesia Plan: General   Post-op Pain Management:    Induction: Intravenous  PONV Risk Score and Plan: 2 and Ondansetron, Midazolam and Treatment may vary due to age or medical condition  Airway Management Planned: LMA  Additional Equipment:   Intra-op Plan:   Post-operative Plan: Extubation in OR  Informed Consent: I have reviewed the patients History and Physical, chart, labs and discussed the procedure including the risks, benefits and alternatives for the proposed anesthesia with the patient or authorized representative who has indicated his/her understanding and acceptance.     Dental advisory given  Plan Discussed with: CRNA and Surgeon  Anesthesia Plan Comments:         Anesthesia Quick Evaluation

## 2022-05-28 NOTE — Progress Notes (Signed)
OT Cancellation Note  Patient Details Name: Todd Parrish MRN: [id number] DOB: 1981-09-29   Cancelled Treatment:    Reason Eval/Treat Not Completed: Other (comment): Spoke with Nursing who sated plan for pt to transfer to [facility] per Orthopaedic recommendations. Will defer to Occupational Therapy at [facility] for Evaluation, but will keep on service in case transfer is delayed.   Theodoro Clock 05/28/2022, 1:58 PM

## 2022-05-28 NOTE — Plan of Care (Signed)
  Problem: Skin Integrity: Goal: Skin integrity will improve Outcome: Progressing   Problem: Activity: Goal: Risk for activity intolerance will decrease Outcome: Progressing   Problem: Safety: Goal: Ability to remain free from injury will improve Outcome: Progressing   Problem: Pain Managment: Goal: General experience of comfort will improve Outcome: Progressing

## 2022-05-29 ENCOUNTER — Inpatient Hospital Stay (HOSPITAL_COMMUNITY): Payer: Medicaid Other | Admitting: Anesthesiology

## 2022-05-29 ENCOUNTER — Encounter (HOSPITAL_COMMUNITY)
Admission: EM | Disposition: A | Discharge status: Home Health | Payer: Self-pay | Source: Home / Self Care | Attending: Internal Medicine

## 2022-05-29 ENCOUNTER — Inpatient Hospital Stay: Payer: Self-pay

## 2022-05-29 ENCOUNTER — Encounter (HOSPITAL_COMMUNITY): Payer: Self-pay | Admitting: Orthopedic Surgery

## 2022-05-29 ENCOUNTER — Other Ambulatory Visit: Payer: Self-pay

## 2022-05-29 DIAGNOSIS — Z6841 Body Mass Index (BMI) 40.0 and over, adult: Secondary | ICD-10-CM

## 2022-05-29 DIAGNOSIS — M86261 Subacute osteomyelitis, right tibia and fibula: Secondary | ICD-10-CM

## 2022-05-29 DIAGNOSIS — I10 Essential (primary) hypertension: Secondary | ICD-10-CM | POA: Diagnosis not present

## 2022-05-29 DIAGNOSIS — L02415 Cutaneous abscess of right lower limb: Secondary | ICD-10-CM | POA: Diagnosis not present

## 2022-05-29 DIAGNOSIS — M869 Osteomyelitis, unspecified: Secondary | ICD-10-CM | POA: Diagnosis not present

## 2022-05-29 DIAGNOSIS — L03115 Cellulitis of right lower limb: Secondary | ICD-10-CM | POA: Diagnosis not present

## 2022-05-29 HISTORY — PX: APPLICATION OF WOUND VAC: SHX5189

## 2022-05-29 HISTORY — PX: I & D EXTREMITY: SHX5045

## 2022-05-29 LAB — AEROBIC/ANAEROBIC CULTURE W GRAM STAIN (SURGICAL/DEEP WOUND)

## 2022-05-29 SURGERY — IRRIGATION AND DEBRIDEMENT EXTREMITY
Anesthesia: General | Site: Leg Lower | Laterality: Right

## 2022-05-29 MED ORDER — DEXAMETHASONE SODIUM PHOSPHATE 10 MG/ML IJ SOLN
INTRAMUSCULAR | Status: AC
  Filled 2022-05-29: qty 1

## 2022-05-29 MED ORDER — OXYCODONE HCL 5 MG PO TABS: 5.0000 mg | ORAL_TABLET | Freq: Once | ORAL | Status: DC | PRN

## 2022-05-29 MED ORDER — LACTATED RINGERS IV SOLN: INTRAVENOUS | Status: DC

## 2022-05-29 MED ORDER — ACETAMINOPHEN 500 MG PO TABS
ORAL_TABLET | ORAL | Status: AC
  Filled 2022-05-29: qty 2

## 2022-05-29 MED ORDER — METRONIDAZOLE 500 MG PO TABS
500.0000 mg | ORAL_TABLET | Freq: Two times a day (BID) | ORAL | Status: DC
  Administered 2022-05-30 – 2022-06-05 (×13): 500 mg via ORAL
  Filled 2022-05-29 (×14): qty 1

## 2022-05-29 MED ORDER — VANCOMYCIN HCL 1000 MG IV SOLR
INTRAVENOUS | Status: DC | PRN
  Administered 2022-05-29: 1000 mg via TOPICAL

## 2022-05-29 MED ORDER — MIDAZOLAM HCL 2 MG/2ML IJ SOLN
INTRAMUSCULAR | Status: AC
  Filled 2022-05-29: qty 2

## 2022-05-29 MED ORDER — FENTANYL CITRATE (PF) 100 MCG/2ML IJ SOLN
INTRAMUSCULAR | Status: AC
  Filled 2022-05-29: qty 2

## 2022-05-29 MED ORDER — FENTANYL CITRATE (PF) 100 MCG/2ML IJ SOLN
25.0000 ug | INTRAMUSCULAR | Status: DC | PRN
  Administered 2022-05-29 (×2): 50 ug via INTRAVENOUS
  Administered 2022-05-29: 25 ug via INTRAVENOUS

## 2022-05-29 MED ORDER — FENTANYL CITRATE (PF) 250 MCG/5ML IJ SOLN
INTRAMUSCULAR | Status: AC
  Filled 2022-05-29: qty 5

## 2022-05-29 MED ORDER — FENTANYL CITRATE (PF) 250 MCG/5ML IJ SOLN
INTRAMUSCULAR | Status: DC | PRN
Start: 2022-05-29 — End: 2022-05-29
  Administered 2022-05-29: 25 ug via INTRAVENOUS
  Administered 2022-05-29: 50 ug via INTRAVENOUS
  Administered 2022-05-29 (×3): 25 ug via INTRAVENOUS

## 2022-05-29 MED ORDER — SODIUM CHLORIDE 0.9 % IV SOLN
8.0000 mg/kg | Freq: Every day | INTRAVENOUS | Status: DC
  Filled 2022-05-29: qty 16

## 2022-05-29 MED ORDER — OXYCODONE HCL 5 MG/5ML PO SOLN: 5.0000 mg | Freq: Once | ORAL | Status: DC | PRN

## 2022-05-29 MED ORDER — 0.9 % SODIUM CHLORIDE (POUR BTL) OPTIME
TOPICAL | Status: DC | PRN
  Administered 2022-05-29: 1000 mL

## 2022-05-29 MED ORDER — ONDANSETRON HCL 4 MG/2ML IJ SOLN
INTRAMUSCULAR | Status: AC
  Filled 2022-05-29: qty 2

## 2022-05-29 MED ORDER — DEXAMETHASONE SODIUM PHOSPHATE 10 MG/ML IJ SOLN
INTRAMUSCULAR | Status: DC | PRN
  Administered 2022-05-29: 10 mg via INTRAVENOUS

## 2022-05-29 MED ORDER — LIDOCAINE 2% (20 MG/ML) 5 ML SYRINGE
INTRAMUSCULAR | Status: DC | PRN
  Administered 2022-05-29: 70 mg via INTRAVENOUS

## 2022-05-29 MED ORDER — PROPOFOL 10 MG/ML IV BOLUS
INTRAVENOUS | Status: AC
  Filled 2022-05-29: qty 20

## 2022-05-29 MED ORDER — ONDANSETRON HCL 4 MG/2ML IJ SOLN
4.0000 mg | Freq: Once | INTRAMUSCULAR | Status: DC | PRN
Start: 2022-05-29 — End: 2022-05-29

## 2022-05-29 MED ORDER — PROPOFOL 10 MG/ML IV BOLUS
INTRAVENOUS | Status: DC | PRN
  Administered 2022-05-29: 250 mg via INTRAVENOUS

## 2022-05-29 MED ORDER — CHLORHEXIDINE GLUCONATE 0.12 % MT SOLN
OROMUCOSAL | Status: AC
  Filled 2022-05-29: qty 15

## 2022-05-29 MED ORDER — LIDOCAINE 2% (20 MG/ML) 5 ML SYRINGE
INTRAMUSCULAR | Status: AC
  Filled 2022-05-29: qty 5

## 2022-05-29 MED ORDER — VANCOMYCIN HCL 1000 MG IV SOLR
INTRAVENOUS | Status: AC
Start: 2022-05-29 — End: ?
  Filled 2022-05-29: qty 20

## 2022-05-29 MED ORDER — SODIUM CHLORIDE 0.9 % IV SOLN
8.0000 mg/kg | Freq: Every day | INTRAVENOUS | Status: DC
  Administered 2022-05-30 – 2022-06-05 (×8): 800 mg via INTRAVENOUS
  Filled 2022-05-29 (×8): qty 16

## 2022-05-29 MED ORDER — ACETAMINOPHEN 500 MG PO TABS
ORAL_TABLET | ORAL | Status: AC
  Administered 2022-05-29: 1000 mg via ORAL
  Filled 2022-05-29: qty 2

## 2022-05-29 MED ORDER — CEFAZOLIN SODIUM-DEXTROSE 2-4 GM/100ML-% IV SOLN
INTRAVENOUS | Status: AC
  Filled 2022-05-29: qty 100

## 2022-05-29 MED ORDER — ONDANSETRON HCL 4 MG/2ML IJ SOLN
INTRAMUSCULAR | Status: DC | PRN
  Administered 2022-05-29: 4 mg via INTRAVENOUS

## 2022-05-29 MED ORDER — CHLORHEXIDINE GLUCONATE 0.12 % MT SOLN
OROMUCOSAL | Status: AC
  Administered 2022-05-29: 15 mL via OROMUCOSAL
  Filled 2022-05-29: qty 15

## 2022-05-29 MED ORDER — AMISULPRIDE (ANTIEMETIC) 5 MG/2ML IV SOLN: 10.0000 mg | Freq: Once | INTRAVENOUS | Status: DC | PRN

## 2022-05-29 MED ORDER — MIDAZOLAM HCL 2 MG/2ML IJ SOLN
INTRAMUSCULAR | Status: DC | PRN
  Administered 2022-05-29: 2 mg via INTRAVENOUS

## 2022-05-29 MED ORDER — ORAL CARE MOUTH RINSE
15.0000 mL | Freq: Once | OROMUCOSAL | Status: AC
Start: 2022-05-29 — End: 2022-05-29

## 2022-05-29 MED ORDER — ACETAMINOPHEN 500 MG PO TABS: 1000.0000 mg | ORAL_TABLET | Freq: Once | ORAL | Status: AC

## 2022-05-29 MED ORDER — CHLORHEXIDINE GLUCONATE 0.12 % MT SOLN
15.0000 mL | Freq: Once | OROMUCOSAL | Status: AC
Start: 2022-05-29 — End: 2022-05-29

## 2022-05-29 SURGICAL SUPPLY — 36 items
BAG COUNTER SPONGE SURGICOUNT (BAG) IMPLANT
BLADE SURG 21 STRL SS (BLADE) ×3 IMPLANT
BNDG COHESIVE 6X5 TAN STRL LF (GAUZE/BANDAGES/DRESSINGS) IMPLANT
BNDG GAUZE ELAST 4 BULKY (GAUZE/BANDAGES/DRESSINGS) ×6 IMPLANT
CANISTER WOUND CARE 500ML ATS (WOUND CARE) ×1 IMPLANT
COVER SURGICAL LIGHT HANDLE (MISCELLANEOUS) ×6 IMPLANT
DRAPE U-SHAPE 47X51 STRL (DRAPES) ×3 IMPLANT
DRESSING VERAFLO CLEANSE CC (GAUZE/BANDAGES/DRESSINGS) IMPLANT
DRSG ADAPTIC 3X8 NADH LF (GAUZE/BANDAGES/DRESSINGS) ×3 IMPLANT
DRSG CLEANSE VERAFLOW (GAUZE/BANDAGES/DRESSINGS) ×1 IMPLANT
DRSG VAC ATS SM SENSATRAC (GAUZE/BANDAGES/DRESSINGS) ×1 IMPLANT
DRSG VERAFLO CLEANSE CC (GAUZE/BANDAGES/DRESSINGS) ×3
DURAPREP 26ML APPLICATOR (WOUND CARE) ×3 IMPLANT
ELECT REM PT RETURN 9FT ADLT (ELECTROSURGICAL)
ELECTRODE REM PT RTRN 9FT ADLT (ELECTROSURGICAL) IMPLANT
GAUZE SPONGE 4X4 12PLY STRL (GAUZE/BANDAGES/DRESSINGS) ×3 IMPLANT
GLOVE BIOGEL PI IND STRL 9 (GLOVE) ×2 IMPLANT
GLOVE BIOGEL PI INDICATOR 9 (GLOVE) ×1
GLOVE SURG ORTHO 9.0 STRL STRW (GLOVE) ×3 IMPLANT
GOWN STRL REUS W/ TWL XL LVL3 (GOWN DISPOSABLE) ×4 IMPLANT
GOWN STRL REUS W/TWL XL LVL3 (GOWN DISPOSABLE) ×2
HANDPIECE INTERPULSE COAX TIP (DISPOSABLE)
KIT BASIN OR (CUSTOM PROCEDURE TRAY) ×3 IMPLANT
KIT TURNOVER KIT B (KITS) ×3 IMPLANT
MANIFOLD NEPTUNE II (INSTRUMENTS) ×3 IMPLANT
NS IRRIG 1000ML POUR BTL (IV SOLUTION) ×3 IMPLANT
PACK ORTHO EXTREMITY (CUSTOM PROCEDURE TRAY) ×3 IMPLANT
PAD ARMBOARD 7.5X6 YLW CONV (MISCELLANEOUS) ×6 IMPLANT
SET HNDPC FAN SPRY TIP SCT (DISPOSABLE) IMPLANT
STOCKINETTE IMPERVIOUS 9X36 MD (GAUZE/BANDAGES/DRESSINGS) IMPLANT
SUT ETHILON 2 0 PSLX (SUTURE) ×3 IMPLANT
SWAB COLLECTION DEVICE MRSA (MISCELLANEOUS) ×3 IMPLANT
SWAB CULTURE ESWAB REG 1ML (MISCELLANEOUS) IMPLANT
TOWEL GREEN STERILE (TOWEL DISPOSABLE) ×3 IMPLANT
TUBE CONNECTING 12X1/4 (SUCTIONS) ×3 IMPLANT
YANKAUER SUCT BULB TIP NO VENT (SUCTIONS) ×3 IMPLANT

## 2022-05-29 NOTE — Plan of Care (Signed)
  Problem: Skin Integrity: Goal: Skin integrity will improve Outcome: Not Applicable

## 2022-05-29 NOTE — Progress Notes (Signed)
Pharmacy Antibiotic Note  Todd Parrish is a 41 y.o. male admitted on 05/22/2022 with RLE abscess, concern for osteo of tibia, planning I&D with hardware removal by ortho.  Pharmacy has been consulted for Daptomycin and Flagyl dosing.   Plan: - D/c Vanc/Cefepime - Start Daptomycin 800 mg (8 mg/kg AdjBW) every 24 hours - Start Flagyl 500 mg po every 12 hours  - Will check baseline CK but expect elevation given recent surgeries, will follow renal fxn, and additional culture results  Height: 5\' 7"  (170.2 cm) Weight: (!) 154.2 kg (339 lb 15.2 oz) IBW/kg (Calculated) : 66.1  Temp (24hrs), Avg:97.9 F (36.6 C), Min:97.6 F (36.4 C), Max:98.4 F (36.9 C)  Recent Labs  Lab 05/23/22 0138 05/24/22 0204 05/25/22 0107 05/25/22 2124 05/26/22 0710 05/26/22 1625 05/27/22 0312 05/28/22 0325  WBC 14.1* 16.8*  --   --  19.0*  --  24.5* 18.2*  CREATININE 0.88 0.82 0.93  --  0.76  --  0.74 0.75  VANCOTROUGH  --   --   --   --  7* 15  --   --   VANCOPEAK  --   --   --  25*  --   --   --   --     Estimated Creatinine Clearance: 172.4 mL/min (by C-G formula based on SCr of 0.75 mg/dL).    Allergies  Allergen Reactions   Morphine And Related Nausea And Vomiting and Other (See Comments)    Elevated blood pressure    Pork-Derived Products Other (See Comments)    No pork for religious reasons - doesn't eat pork No pork for religious reasons   Shellfish Allergy Other (See Comments)    No shrimp for religious reasons -doesn't eat shrimp religious puroses No shrimp for religious reasons    Thank you for allowing pharmacy to be a part of this patient's care.  07/28/22, PharmD, BCPS Infectious Diseases Clinical Pharmacist 05/29/2022 2:42 PM   **Pharmacist phone directory can now be found on [email or web address] ([email or web address]).  Listed under [facility].

## 2022-05-29 NOTE — Anesthesia Procedure Notes (Addendum)
Procedure Name: LMA Insertion Date/Time: 05/29/2022 4:48 PM  Performed by: Cy Blamer, CRNAPre-anesthesia Checklist: Patient identified, Emergency Drugs available, Suction available and Patient being monitored Patient Re-evaluated:Patient Re-evaluated prior to induction Oxygen Delivery Method: Circle system utilized Preoxygenation: Pre-oxygenation with 100% oxygen Induction Type: IV induction LMA: LMA inserted LMA Size: 5.0 Number of attempts: 1 Placement Confirmation: positive ETCO2 Tube secured with: Tape Dental Injury: Teeth and Oropharynx as per pre-operative assessment
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
## 2022-05-29 NOTE — Anesthesia Preprocedure Evaluation (Signed)
Anesthesia Evaluation  Patient identified by MRN, date of birth, ID band Patient awake    Reviewed: Allergy & Precautions, NPO status , Patient's Chart, lab work & pertinent test results  History of Anesthesia Complications Negative for: history of anesthetic complications  Airway Mallampati: I  TM Distance: >3 FB Neck ROM: Full    Dental  (+) Dental Advisory Given, Teeth Intact   Pulmonary PE   Pulmonary exam normal        Cardiovascular hypertension, + DVT  Normal cardiovascular exam     Neuro/Psych negative neurological ROS     GI/Hepatic Neg liver ROS, GERD  ,  Endo/Other  Morbid obesity  Renal/GU negative Renal ROS  negative genitourinary   Musculoskeletal negative musculoskeletal ROS (+)   Abdominal   Peds  Hematology  (+) Blood dyscrasia, anemia , HIV,   Anesthesia Other Findings R tibial plateau osteomyelitis  Reproductive/Obstetrics                             Anesthesia Physical Anesthesia Plan  ASA: 3  Anesthesia Plan: General   Post-op Pain Management: Tylenol PO (pre-op)* and Toradol IV (intra-op)*   Induction: Intravenous  PONV Risk Score and Plan: 2 and Ondansetron, Dexamethasone, Midazolam and Treatment may vary due to age or medical condition  Airway Management Planned: LMA  Additional Equipment: None  Intra-op Plan:   Post-operative Plan: Extubation in OR  Informed Consent: I have reviewed the patients History and Physical, chart, labs and discussed the procedure including the risks, benefits and alternatives for the proposed anesthesia with the patient or authorized representative who has indicated his/her understanding and acceptance.     Dental advisory given  Plan Discussed with:   Anesthesia Plan Comments:         Anesthesia Quick Evaluation

## 2022-05-29 NOTE — Op Note (Signed)
05/28/2022  12:34 PM  PATIENT:  Todd Parrish    PRE-OPERATIVE DIAGNOSIS:  hardware in right tibia  POST-OPERATIVE DIAGNOSIS:  Same  PROCEDURE:  INCISION AND DRAINAGE ABSCESS  SURGEON:  Sheral Apley, MD  ASSISTANT: Levester Fresh, PA-C, he was present and scrubbed throughout the case, critical for completion in a timely fashion, and for retraction, instrumentation, and closure.   ANESTHESIA:   gen  PREOPERATIVE INDICATIONS:  Todd Parrish is a  41 y.o. male with a diagnosis of hardware in right tibia who failed conservative measures and elected for surgical management.    The risks benefits and alternatives were discussed with the patient preoperatively including but not limited to the risks of infection, bleeding, nerve injury, cardiopulmonary complications, the need for revision surgery, among others, and the patient was willing to proceed.  OPERATIVE IMPLANTS: none  OPERATIVE FINDINGS: fluid collection  BLOOD LOSS: min  COMPLICATIONS: none  TOURNIQUET TIME: none  OPERATIVE PROCEDURE:  Patient was identified in the preoperative holding area and site was marked by me He was transported to the operating theater and placed on the table in supine position taking care to pad all bony prominences. After a preincinduction time out anesthesia was induced. The left lower extremity was prepped and draped in normal sterile fashion and a pre-incision timeout was performed. He received vanc for preoperative antibiotics.   I removed his stitches from his previous incision and open his previous incision.  There was some fluid collected and hematoma.  Some drainage was consistent with Vanco powder and possibly some recurrent purulence.  Next I introduced the cystoscopy tubing into his knee joint and thoroughly irrigated that with 3 L of fluid  Next I used pulse lavage to irrigate the remainder of his wound I debrided some devitalized tissue as well as down to bone I made a  cortical window through his previous screw holes and through this I was able to introduce some bank powder into his intraosseous space.  I thoroughly irrigated again I then placed a few retention stitches to limit skin edge retraction and placed a wound VAC which had a good suction seal sterile dressing was applied he was awoken taken the PACU in stable condition  POST OPERATIVE PLAN: WBAT, dvt px per primary

## 2022-05-29 NOTE — Progress Notes (Signed)
OT Cancellation Note  Patient Details Name: Todd Parrish MRN: [id number] DOB: 1981/09/10   Cancelled Treatment:    Reason Eval/Treat Not Completed: Patient at procedure or test/ unavailable   Brynn, OTR/L  Acute Rehabilitation Services Office: [phone] .  Mateo Flow 05/29/2022, 2:23 PM

## 2022-05-29 NOTE — Anesthesia Postprocedure Evaluation (Signed)
Anesthesia Post Note  Patient: Todd Parrish  Procedure(s) Performed: DEBRIDEMENT RIGHT TIBIA (Right) APPLICATION OF WOUND VAC (Right: Leg Lower)     Patient location during evaluation: PACU Anesthesia Type: General Level of consciousness: awake Pain management: pain level controlled Vital Signs Assessment: post-procedure vital signs reviewed and stable Respiratory status: spontaneous breathing Cardiovascular status: stable Postop Assessment: no apparent nausea or vomiting Anesthetic complications: no   No notable events documented.  Last Vitals:  Vitals:   05/29/22 0751 05/29/22 1400  BP: 123/70 134/70  Pulse: 73 71  Resp: 16 20  Temp: 36.5 C 36.6 C  SpO2: 99% 97%    Last Pain:  Vitals:   05/29/22 1408  TempSrc:   PainSc: 0-No pain                 Todd Parrish

## 2022-05-29 NOTE — Transfer of Care (Signed)
Immediate Anesthesia Transfer of Care Note  Patient: [employer] Susi  Procedure(s) Performed: DEBRIDEMENT RIGHT TIBIA (Right) APPLICATION OF WOUND VAC (Right: Leg Lower)  Patient Location: PACU  Anesthesia Type:General  Level of Consciousness: awake, alert  and oriented  Airway & Oxygen Therapy: Patient Spontanous Breathing  Post-op Assessment: Report given to RN, Post -op Vital signs reviewed and stable, Patient moving all extremities X 4 and Patient able to stick tongue midline  Post vital signs: Reviewed  Last Vitals:  Vitals Value Taken Time  BP 155/100   Temp 98.4   Pulse 82 05/29/22 1732  Resp 8 05/29/22 1732  SpO2 100 % 05/29/22 1732  Vitals shown include unvalidated device data.  Last Pain:  Vitals:   05/29/22 1408  TempSrc:   PainSc: 0-No pain      Patients Stated Pain Goal: 0 (05/28/22 2240)  Complications: No notable events documented.

## 2022-05-29 NOTE — Progress Notes (Signed)
Pt is requesting to speak with MD before signing consent.

## 2022-05-29 NOTE — Progress Notes (Signed)
[name] for Infectious Disease    Date of Admission:  05/22/2022   Total days of antibiotics 9/vanco and cefepime           ID: Todd Parrish is a 41 y.o. male with  right tibia-HW infection and large connecting tissue abscess  6 x 3 x3 cm Principal Problem:   Cellulitis of right lower extremity Active Problems:   Human immunodeficiency virus (HIV) disease (HCC)   Class 3 severe obesity due to excess calories with serious comorbidity and body mass index (BMI) of 50.0 to 59.9 in adult [facility])   Essential hypertension   Anemia of chronic disease   Cellulitis of lower extremity   Wound of right leg, initial encounter    Subjective: Less fevers still having some pain and discomfort to right leg, wound vac in place with serosanginous fluid  Medications:   bictegravir-emtricitabine-tenofovir AF  1 tablet Oral Daily   carvedilol  3.125 mg Oral BID WC   chlorhexidine  60 mL Topical Once   docusate sodium  200 mg Oral BID   ferrous sulfate  325 mg Oral BID WC   folic acid  1 mg Oral Daily   mupirocin ointment   Nasal BID   pantoprazole  40 mg Oral Daily   povidone-iodine  2 application  Topical Once    Objective: Vital signs in last 24 hours: Temp:  [97.6 F (36.4 C)-98.4 F (36.9 C)] 97.7 F (36.5 C) (06/09 0751) Pulse Rate:  [73-86] 73 (06/09 0751) Resp:  [16-18] 16 (06/09 0751) BP: (123-156)/(65-80) 123/70 (06/09 0751) SpO2:  [97 %-99 %] 99 % (06/09 0751)  Physical Exam  Constitutional: He is oriented to person, place, and time. He appears well-developed and well-nourished. No distress.  HENT:  Mouth/Throat: Oropharynx is clear and moist. No oropharyngeal exudate.  Cardiovascular: Normal rate, regular rhythm and normal heart sounds. Exam reveals no gallop and no friction rub.  No murmur heard.  Pulmonary/Chest: Effort normal and breath sounds normal. No respiratory distress. He has no wheezes.  Abdominal: Soft. Bowel sounds are normal. He exhibits no  distension. There is no tenderness.  Lymphadenopathy:  He has no cervical adenopathy.  Neurological: He is alert and oriented to person, place, and time.  Skin: Skin is warm and dry. No rash noted. No erythema.  Psychiatric: He has a normal mood and affect. His behavior is normal.    Lab Results Recent Labs    05/27/22 0312 05/28/22 0325  WBC 24.5* 18.2*  HGB 9.0* 8.4*  HCT 28.4* 28.9*  NA 136 136  K 4.2 3.9  CL 104 102  CO2 25 25  BUN 16 18  CREATININE 0.74 0.75   Liver Panel Recent Labs    05/27/22 0312 05/28/22 0325  PROT 8.2* 7.4  ALBUMIN 2.2* 2.2*  AST 30 33  ALT 29 36  ALKPHOS 83 72  BILITOT 0.4 0.4    C-Reactive Protein Recent Labs    05/27/22 0312 05/28/22 1317  CRP 26.0* 9.8*    Microbiology: Specimen Description LEG RIGHT LEG FLUID  Performed at [facility], [place] [address] [place], [place] [address]   Special Requests NONE  Performed at [facility], [place] [address] [place], [place] [address]   Gram Stain ABUNDANT WBC PRESENT,BOTH PMN AND MONONUCLEAR  ABUNDANT GRAM NEGATIVE RODS   Culture ABUNDANT PREVOTELLA DENTICOLA  BETA LACTAMASE POSITIVE  Performed at [facility]   ----------------------------- Special Requests RIGHT LEG  Gram Stain NO WBC SEEN  NO ORGANISMS SEEN  Performed at [facility], [place] [address], [place], [place] [address]   Culture RARE METHICILLIN RESISTANT STAPHYLOCOCCUS AUREUS   Report Status 05/25/2022 FINAL   Organism ID, Bacteria METHICILLIN RESISTANT STAPHYLOCOCCUS AUREUS   Resulting Agency [facility]     Susceptibility   Methicillin resistant staphylococcus aureus    MIC    CIPROFLOXACIN >=8 RESISTANT  Resistant    CLINDAMYCIN >=8 RESISTANT  Resistant    ERYTHROMYCIN >=8 RESISTANT  Resistant    GENTAMICIN <=0.5 SENSI... Sensitive    Inducible Clindamycin NEGATIVE  Sensitive    OXACILLIN >=4 RESISTANT  Resistant    RIFAMPIN <=0.5 SENSI... Sensitive     TETRACYCLINE <=1 SENSITIVE  Sensitive    TRIMETH/SULFA >=320 RESIS... Resistant    VANCOMYCIN 1 SENSITIVE  Sensitive           Susceptibility Comments  Methicillin resistant staphylococcus aureus  RARE METHICILLIN RESISTANT STAPHYLOCOCCUS AUREUS         Studies/Results: 6/5 IMPRESSION: 1. Lateral tibial plateau fracture transfixed with a lateral sideplate and multiple interlocking screws. Severe soft tissue edema in the subcutaneous fat abutting the orthopedic hardware and an ill-defined complex fluid collection measuring approximately 6.5 x 3 x 3 cm most concerning for cellulitis and an abscess. Mild osteolysis along the sideplate abutting the abscess and area of cellulitis concerning for hardware infection. 2. 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 3. Moderate knee joint effusion.  Assessment/Plan:  Polymicrobial abscess to right leg connecting to tibia plateau fx screws - concern for osteomyelitis of tibia= undergoing debridement later this afternoon again. Currently on vancomycin and cefepime.   Recommend repeat cultures from surgery today  High risk medication/toxicity = continue to follow vanco trough and cr  Plan to get picc line in arm for IV abtx x 6 wk. Final abtx recs still pending  Hiv disease= continue on biktarvy, will change to night time dosing which is when he takes it usually  Hx of syphilis = no longer needs treatment  [facility] for Infectious Diseases Pager: [phone]  05/29/2022, 12:12 PM

## 2022-05-29 NOTE — Op Note (Signed)
05/29/2022  5:50 PM  PATIENT:  Todd Parrish Anes    PRE-OPERATIVE DIAGNOSIS:  Osteomyelitis Right Tibial Plateau  POST-OPERATIVE DIAGNOSIS:  Same  PROCEDURE: Excisional DEBRIDEMENT RIGHT TIBIA AND SOFT TISSUE APPLICATION OF WOUND VAC Placement 1 g vancomycin powder.  SURGEON:  Nadara Mustard, MD  PHYSICIAN ASSISTANT:None ANESTHESIA:   General  PREOPERATIVE INDICATIONS:  Todd Parrish is a  41 y.o. male with a diagnosis of Osteomyelitis Right Tibial Plateau who failed conservative measures and elected for surgical management.    The risks benefits and alternatives were discussed with the patient preoperatively including but not limited to the risks of infection, bleeding, nerve injury, cardiopulmonary complications, the need for revision surgery, among others, and the patient was willing to proceed.  OPERATIVE IMPLANTS: 1 g vancomycin powder and cleanse choice wound VAC  @ENCIMAGES @  OPERATIVE FINDINGS: Patient had nonviable soft tissue that was excised.  Patient also had bony excisional debridement.  Patient will require repeat debridement next week.  Tissue was sent for cultures.  OPERATIVE PROCEDURE: Patient was brought the operating room and underwent a general anesthetic.  After adequate levels of anesthesia were obtained patient's right lower extremity was prepped using DuraPrep draped into a sterile field a timeout was called.  A circumferential incision was made around the open wound carried down to healthy viable tissue.  There was excisional debridement of the tibia.  The wound margins were cleansed the wound was irrigated with normal saline.  Vancomycin powder was placed within the bone The wound was packed open with a cleanse choice wound VAC sponge.  This had a good suction fit patient was extubated taken the PACU in stable condition.   DISCHARGE PLANNING:  Antibiotic duration: Continue IV antibiotics repeat tissue was sent for cultures  Weightbearing:  Weightbearing as tolerated  Pain medication: Opioid pathway  Dressing care/ Wound VAC: Continue wound VAC plan for return to the operating room on Wednesday  Ambulatory devices: Walker  Plan to return to the operating room on Wednesday.  Discharge to: Home after repeat debridement  Follow-up: In the office 1 week post operative.

## 2022-05-30 ENCOUNTER — Encounter (HOSPITAL_COMMUNITY): Payer: Self-pay | Admitting: Orthopedic Surgery

## 2022-05-30 DIAGNOSIS — L03115 Cellulitis of right lower limb: Secondary | ICD-10-CM | POA: Diagnosis not present

## 2022-05-30 LAB — CBC
HCT: 27.1 % — ABNORMAL LOW (ref 39.0–52.0)
Hemoglobin: 8.1 g/dL — ABNORMAL LOW (ref 13.0–17.0)
MCH: 23.5 pg — ABNORMAL LOW (ref 26.0–34.0)
MCHC: 29.9 g/dL — ABNORMAL LOW (ref 30.0–36.0)
MCV: 78.8 fL — ABNORMAL LOW (ref 80.0–100.0)
Platelets: 570 10*3/uL — ABNORMAL HIGH (ref 150–400)
RBC: 3.44 MIL/uL — ABNORMAL LOW (ref 4.22–5.81)
RDW: 14.6 % (ref 11.5–15.5)
WBC: 20.1 10*3/uL — ABNORMAL HIGH (ref 4.0–10.5)
nRBC: 0.3 % — ABNORMAL HIGH (ref 0.0–0.2)

## 2022-05-30 LAB — BASIC METABOLIC PANEL
Anion gap: 11 (ref 5–15)
BUN: 13 mg/dL (ref 6–20)
CO2: 24 mmol/L (ref 22–32)
Calcium: 8.2 mg/dL — ABNORMAL LOW (ref 8.9–10.3)
Chloride: 99 mmol/L (ref 98–111)
Creatinine, Ser: 0.67 mg/dL (ref 0.61–1.24)
GFR, Estimated: >60 mL/min (ref >60)
Glucose, Bld: 211 mg/dL — ABNORMAL HIGH (ref 70–99)
Potassium: 4.5 mmol/L (ref 3.5–5.1)
Sodium: 134 mmol/L — ABNORMAL LOW (ref 135–145)

## 2022-05-30 LAB — CK: Total CK: 41 U/L — ABNORMAL LOW (ref 49–397)

## 2022-05-30 MED ORDER — ACETAMINOPHEN 325 MG PO TABS: 325.0000 mg | ORAL_TABLET | Freq: Four times a day (QID) | ORAL | Status: DC | PRN

## 2022-05-30 MED ORDER — METHOCARBAMOL 500 MG PO TABS: 500.0000 mg | ORAL_TABLET | Freq: Four times a day (QID) | ORAL | Status: DC | PRN

## 2022-05-30 MED ORDER — OXYCODONE HCL 5 MG PO TABS: 10.0000 mg | ORAL_TABLET | ORAL | Status: DC | PRN

## 2022-05-30 MED ORDER — ONDANSETRON HCL 4 MG PO TABS: 4.0000 mg | ORAL_TABLET | Freq: Four times a day (QID) | ORAL | Status: DC | PRN

## 2022-05-30 MED ORDER — CEFAZOLIN SODIUM-DEXTROSE 2-4 GM/100ML-% IV SOLN
2.0000 g | Freq: Four times a day (QID) | INTRAVENOUS | Status: AC
  Administered 2022-05-30 (×3): 2 g via INTRAVENOUS
  Filled 2022-05-30 (×3): qty 100

## 2022-05-30 MED ORDER — METOCLOPRAMIDE HCL 5 MG/ML IJ SOLN: 5.0000 mg | Freq: Three times a day (TID) | INTRAMUSCULAR | Status: DC | PRN

## 2022-05-30 MED ORDER — CHLORHEXIDINE GLUCONATE CLOTH 2 % EX PADS
6.0000 | MEDICATED_PAD | Freq: Every day | CUTANEOUS | Status: DC
  Administered 2022-05-30 – 2022-06-05 (×6): 6 via TOPICAL

## 2022-05-30 MED ORDER — METOCLOPRAMIDE HCL 5 MG PO TABS: 5.0000 mg | ORAL_TABLET | Freq: Three times a day (TID) | ORAL | Status: DC | PRN

## 2022-05-30 MED ORDER — SODIUM CHLORIDE 0.9% FLUSH
10.0000 mL | INTRAVENOUS | Status: DC | PRN
  Administered 2022-06-02 – 2022-06-03 (×3): 10 mL

## 2022-05-30 MED ORDER — SODIUM CHLORIDE 0.9% FLUSH
10.0000 mL | Freq: Two times a day (BID) | INTRAVENOUS | Status: DC
  Administered 2022-05-30 – 2022-06-05 (×10): 10 mL

## 2022-05-30 MED ORDER — SODIUM CHLORIDE 0.9 % IV SOLN: INTRAVENOUS | Status: DC

## 2022-05-30 MED ORDER — OXYCODONE HCL 5 MG PO TABS: 5.0000 mg | ORAL_TABLET | ORAL | Status: DC | PRN

## 2022-05-30 MED ORDER — POLYETHYLENE GLYCOL 3350 17 G PO PACK: 17.0000 g | PACK | Freq: Every day | ORAL | Status: DC | PRN

## 2022-05-30 MED ORDER — DOCUSATE SODIUM 100 MG PO CAPS: 100.0000 mg | ORAL_CAPSULE | Freq: Two times a day (BID) | ORAL | Status: DC

## 2022-05-30 MED ORDER — ONDANSETRON HCL 4 MG/2ML IJ SOLN: 4.0000 mg | Freq: Four times a day (QID) | INTRAMUSCULAR | Status: DC | PRN

## 2022-05-30 MED ORDER — METHOCARBAMOL 1000 MG/10ML IJ SOLN: 500.0000 mg | Freq: Four times a day (QID) | INTRAVENOUS | Status: DC | PRN

## 2022-05-30 MED ORDER — HYDROMORPHONE HCL 1 MG/ML IJ SOLN: 0.5000 mg | INTRAMUSCULAR | Status: DC | PRN

## 2022-05-30 MED ORDER — BISACODYL 10 MG RE SUPP: 10.0000 mg | Freq: Every day | RECTAL | Status: DC | PRN

## 2022-05-30 NOTE — Progress Notes (Signed)
Peripherally Inserted Central Catheter Placement  The IV Nurse has discussed with the patient and/or persons authorized to consent for the patient, the purpose of this procedure and the potential benefits and risks involved with this procedure.  The benefits include less needle sticks, lab draws from the catheter, and the patient may be discharged home with the catheter. Risks include, but not limited to, infection, bleeding, blood clot (thrombus formation), and puncture of an artery; nerve damage and irregular heartbeat and possibility to perform a PICC exchange if needed/ordered by physician.  Alternatives to this procedure were also discussed.  Bard Power PICC patient education guide, fact sheet on infection prevention and patient information card has been provided to patient /or left at bedside.    PICC Placement Documentation  PICC Single Lumen 05/30/22 Right Brachial 43 cm 1 cm (Active)  Indication for Insertion or Continuance of Line Home intravenous therapies (PICC only) 05/30/22 0910  Exposed Catheter (cm) 1 cm 05/30/22 0910  Site Assessment Clean, Dry, Intact 05/30/22 0910  Line Status Flushed;Saline locked;Blood return noted 05/30/22 0910  Dressing Type Transparent;Securing device 05/30/22 0910  Dressing Status Antimicrobial disc in place;Clean, Dry, Intact 05/30/22 0910  Safety Lock Not Applicable [id number]  Line Care Tubing changed;Connections checked and tightened 05/30/22 0910  Line Adjustment (NICU/IV Team Only) No 05/30/22 0910  Dressing Intervention New dressing 05/30/22 0910  Dressing Change Due 06/06/22 05/30/22 0910       Rolena Infante 05/30/2022, 9:11 AM

## 2022-05-30 NOTE — Progress Notes (Signed)
Patient ID: Todd Parrish, male   DOB: [birth year], 41 y.o.   MRN: [id number] Patient is postoperative day 1 repeat debridement of osteomyelitis tibial plateau on the right status post removal of deep retained hardware and placement of antibiotics.  Patient has a wound VAC in place.  There is 250 cc in the wound VAC canister.  Culture sensitivities are pending.  We will plan for repeat debridement and surgery on Wednesday.  Patient has a PICC line in place.  Previous cultures are showing MRSA and gram-negative rods.

## 2022-05-30 NOTE — Progress Notes (Addendum)
[facility] for Infectious Disease    Date of Admission:  05/22/2022   Total days of antibiotics 10          ID: Todd Parrish is a 41 y.o. male with right  leg Principal Problem:   Cellulitis of right lower extremity Active Problems:   Human immunodeficiency virus (HIV) disease ([place])   Class 3 severe obesity due to excess calories with serious comorbidity and body mass index (BMI) of 50.0 to 59.9 in adult [facility])   Essential hypertension   Anemia of chronic disease   Cellulitis and abscess of right lower extremity   Wound of right leg, initial encounter    Subjective: Febrile, good pain control enjoying fellowship with visitors. Also had right arm picc line placed without difficulty  Underwent I x D of right tibia nad soft tissue with wound vac placement  Medications:   bictegravir-emtricitabine-tenofovir AF  1 tablet Oral Daily   carvedilol  3.125 mg Oral BID WC   Chlorhexidine Gluconate Cloth  6 each Topical Daily   docusate sodium  200 mg Oral BID   ferrous sulfate  325 mg Oral BID WC   folic acid  1 mg Oral Daily   metroNIDAZOLE  500 mg Oral Q12H   mupirocin ointment   Nasal BID   pantoprazole  40 mg Oral Daily   sodium chloride flush  10-40 mL Intracatheter Q12H    Objective: Vital signs in last 24 hours: Temp:  [97.4 F (36.3 C)-97.9 F (36.6 C)] 97.7 F (36.5 C) (06/10 0502) Pulse Rate:  [65-82] 80 (06/10 0502) Resp:  [12-16] 16 (06/10 0502) BP: (127-155)/(58-100) 127/62 (06/10 0502) SpO2:  [95 %-100 %] 100 % (06/10 0502)  Physical Exam  Constitutional: He is oriented to person, place, and time. He appears well-developed and well-nourished. No distress.  HENT:  Mouth/Throat: Oropharynx is clear and moist. No oropharyngeal exudate.  Cardiovascular: Normal rate, regular rhythm and normal heart sounds. Exam reveals no gallop and no friction rub.  No murmur heard.  Pulmonary/Chest: Effort normal and breath sounds normal. No respiratory distress.  He has no wheezes.  Abdominal: Soft. Bowel sounds are normal. He exhibits no distension. There is no tenderness.  ZOX:WRUEA leg wound vac in place and right arm picc line is c/d/i Neurological: He is alert and oriented to person, place, and time.  Skin: Skin is warm and dry. No rash noted. No erythema.  Psychiatric: He has a normal mood and affect. His behavior is normal.    Lab Results Recent Labs    05/28/22 0325 05/30/22 0235  WBC 18.2* 20.1*  HGB 8.4* 8.1*  HCT 28.9* 27.1*  NA 136 134*  K 3.9 4.5  CL 102 99  CO2 25 24  BUN 18 13  CREATININE 0.75 0.67   Liver Panel Recent Labs    05/28/22 0325  PROT 7.4  ALBUMIN 2.2*  AST 33  ALT 36  ALKPHOS 72  BILITOT 0.4     C-Reactive Protein Recent Labs    05/28/22 1317  CRP 9.8*   Lab Results  Component Value Date   ESRSEDRATE >140 (H) 05/31/2022    Microbiology: Prevotella _beta lactamase mrsa Studies/Results: [place] EKG SITE RITE  Result Date: 05/29/2022 If Site Rite image not attached, placement could not be confirmed due to current cardiac rhythm.    Assessment/Plan: HIV disease = continue on biktarvy, well controlled. We will see back in the ID clinic for follow up   Polymicrobial tibia osteomyelitis  with HW removal = continue with daptomycin  (MRSA) plus oral metronidazole 557m po bid (prevotella) Will follow up culture results to see if need to change abtx further.  Anticipate 6 wk iv abtx via picc line  Will sign off and follow up and culture data  OPAT orders listed below:  Diagnosis: Tibia osteomyelitis with soft tissue abscess and screw/hw involvement s/p debridement  Culture Result: MRSA and prevotella(beta-lactamase)  Allergies  Allergen Reactions   Morphine And Related Nausea And Vomiting and Other (See Comments)    Elevated blood pressure    Pork-Derived Products Other (See Comments)    No pork for religious reasons - doesn't eat pork No pork for religious reasons   Shellfish Allergy  Other (See Comments)    No shrimp for religious reasons -doesn't eat shrimp religious puroses No shrimp for religious reasons    OPAT Orders Discharge antibiotics to be given via PICC line Discharge antibiotics: Per pharmacy protocol:  daptomycin at 869mkg daily Duration: 6 wk End Date: 07/11/2022  [facility]  Home health RN for IV administration and teaching; PICC line care and labs.    Labs weekly while on IV antibiotics: __x CBC with differential _x_ BMP  __x CRP _x_ ESR  _x_ CK  _x_ Please pull PIC at completion of IV antibiotics   Fax weekly labs to ([phone] Follow Up Appt: 5 wk with Shristi Scheib or greg calone  @ [facility] Pager: [phone]  05/30/2022, 4:42 PM

## 2022-05-30 NOTE — Progress Notes (Signed)
PT Cancellation Note  Patient Details Name: Lachlan Mckim MRN: [id number] DOB: May 13, 1981   Cancelled Treatment:    Reason Eval/Treat Not Completed: Other (comment).  Was in a procedure in AM then declined therapy over fatigue.  Retry as time and pt allow.   Ivar Drape 05/30/2022, 3:43 PM  Samul Dada, PT PhD Acute Rehab Dept. Number: [facility] [id number] and [facility] [phone]

## 2022-05-30 NOTE — Plan of Care (Signed)
  Problem: Clinical Measurements: Goal: Ability to avoid or minimize complications of infection will improve Outcome: Progressing   Problem: Education: Goal: Knowledge of General Education information will improve Description: Including pain rating scale, medication(s)/side effects and non-pharmacologic comfort measures Outcome: Progressing   Problem: Health Behavior/Discharge Planning: Goal: Ability to manage health-related needs will improve Outcome: Progressing   Problem: Clinical Measurements: Goal: Ability to maintain clinical measurements within normal limits will improve Outcome: Progressing Goal: Will remain free from infection Outcome: Progressing Goal: Diagnostic test results will improve Outcome: Progressing Goal: Respiratory complications will improve Outcome: Progressing Goal: Cardiovascular complication will be avoided Outcome: Progressing   Problem: Activity: Goal: Risk for activity intolerance will decrease Outcome: Progressing   Problem: Nutrition: Goal: Adequate nutrition will be maintained Outcome: Progressing   Problem: Coping: Goal: Level of anxiety will decrease Outcome: Progressing   Problem: Elimination: Goal: Will not experience complications related to bowel motility Outcome: Progressing Goal: Will not experience complications related to urinary retention Outcome: Progressing   Problem: Pain Managment: Goal: General experience of comfort will improve Outcome: Progressing   Problem: Safety: Goal: Ability to remain free from injury will improve Outcome: Progressing   Problem: Skin Integrity: Goal: Risk for impaired skin integrity will decrease Outcome: Progressing   Problem: Education: Goal: Required Educational Video(s) Outcome: Progressing   Problem: Clinical Measurements: Goal: Postoperative complications will be avoided or minimized Outcome: Progressing

## 2022-05-30 NOTE — Progress Notes (Signed)
OT Cancellation Note  Patient Details Name: Chun Sellen MRN: [id number] DOB: 11-21-1981   Cancelled Treatment:    Reason Eval/Treat Not Completed: Patient at procedure or test/ unavailable Will follow up for OT eval as schedule permits.  Lorre Munroe 05/30/2022, 8:29 AM
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
## 2022-05-30 NOTE — Plan of Care (Signed)
  Problem: Clinical Measurements: Goal: Ability to avoid or minimize complications of infection will improve Outcome: Progressing   Problem: Education: Goal: Knowledge of General Education information will improve Description: Including pain rating scale, medication(s)/side effects and non-pharmacologic comfort measures Outcome: Progressing   Problem: Health Behavior/Discharge Planning: Goal: Ability to manage health-related needs will improve Outcome: Progressing   Problem: Clinical Measurements: Goal: Ability to maintain clinical measurements within normal limits will improve Outcome: Progressing Goal: Will remain free from infection Outcome: Progressing Goal: Diagnostic test results will improve Outcome: Progressing Goal: Respiratory complications will improve Outcome: Progressing Goal: Cardiovascular complication will be avoided Outcome: Progressing   Problem: Activity: Goal: Risk for activity intolerance will decrease Outcome: Progressing   Problem: Nutrition: Goal: Adequate nutrition will be maintained Outcome: Progressing   Problem: Coping: Goal: Level of anxiety will decrease Outcome: Progressing   Problem: Elimination: Goal: Will not experience complications related to bowel motility Outcome: Progressing   Problem: Pain Managment: Goal: General experience of comfort will improve Outcome: Progressing   Problem: Skin Integrity: Goal: Risk for impaired skin integrity will decrease Outcome: Progressing

## 2022-05-31 DIAGNOSIS — L03115 Cellulitis of right lower limb: Secondary | ICD-10-CM | POA: Diagnosis not present

## 2022-05-31 LAB — CBC
HCT: 24.5 % — ABNORMAL LOW (ref 39.0–52.0)
Hemoglobin: 7.7 g/dL — ABNORMAL LOW (ref 13.0–17.0)
MCH: 24.9 pg — ABNORMAL LOW (ref 26.0–34.0)
MCHC: 31.4 g/dL (ref 30.0–36.0)
MCV: 79.3 fL — ABNORMAL LOW (ref 80.0–100.0)
Platelets: 516 10*3/uL — ABNORMAL HIGH (ref 150–400)
RBC: 3.09 MIL/uL — ABNORMAL LOW (ref 4.22–5.81)
RDW: 14.6 % (ref 11.5–15.5)
WBC: 20.1 10*3/uL — ABNORMAL HIGH (ref 4.0–10.5)
nRBC: 0.3 % — ABNORMAL HIGH (ref 0.0–0.2)

## 2022-05-31 LAB — BASIC METABOLIC PANEL
Anion gap: 6 (ref 5–15)
BUN: 14 mg/dL (ref 6–20)
CO2: 27 mmol/L (ref 22–32)
Calcium: 7.8 mg/dL — ABNORMAL LOW (ref 8.9–10.3)
Chloride: 101 mmol/L (ref 98–111)
Creatinine, Ser: 0.83 mg/dL (ref 0.61–1.24)
GFR, Estimated: >60 mL/min (ref >60)
Glucose, Bld: 135 mg/dL — ABNORMAL HIGH (ref 70–99)
Potassium: 3.6 mmol/L (ref 3.5–5.1)
Sodium: 134 mmol/L — ABNORMAL LOW (ref 135–145)

## 2022-05-31 LAB — SEDIMENTATION RATE: Sed Rate: >140 mm/hr — ABNORMAL HIGH (ref 0–16)

## 2022-05-31 MED ORDER — BICTEGRAVIR-EMTRICITAB-TENOFOV 50-200-25 MG PO TABS
1.0000 | ORAL_TABLET | Freq: Every day | ORAL | Status: DC
  Administered 2022-05-31 – 2022-06-05 (×6): 1 via ORAL
  Filled 2022-05-31 (×6): qty 1

## 2022-05-31 MED ORDER — RIVAROXABAN 10 MG PO TABS
10.0000 mg | ORAL_TABLET | Freq: Every day | ORAL | Status: AC
  Administered 2022-05-31 – 2022-06-01 (×2): 10 mg via ORAL
  Filled 2022-05-31 (×2): qty 1

## 2022-05-31 NOTE — Progress Notes (Signed)
PHARMACY CONSULT NOTE FOR:  OUTPATIENT  PARENTERAL ANTIBIOTIC THERAPY (OPAT)  Indication: Polymicrobial tibial osteomyelitis (MRSA/Prevotella) Regimen: Daptomycin 810 mg Q24H (8 mg/kg Adjusted BW)  End date: 07/11/2022   IV antibiotic discharge orders are pended. To discharging provider:  please sign these orders via discharge navigator,  Select New Orders & click on the button choice - Manage This Unsigned Work.     Thank you for allowing pharmacy to be a part of this patient's care.  Adria Dill, PharmD PGY-1 Acute Care Resident  05/31/2022 10:26 AM

## 2022-05-31 NOTE — Progress Notes (Signed)
Inpatient Rehab Admissions Coordinator:  ? ?Per therapy recommendations,  patient was screened for CIR candidacy by Jakson Delpilar, MS, CCC-SLP. At this time, Pt. Appears to be a a potential candidate for CIR. I will place   order for rehab consult per protocol for full assessment. Please contact me any with questions. ? ?Dartanian Knaggs, MS, CCC-SLP ?Rehab Admissions Coordinator  ?[phone] (celll) ?[phone] (office) ? ?

## 2022-05-31 NOTE — Evaluation (Signed)
Occupational Therapy Evaluation Patient Details Name: Todd Parrish MRN: [id number] DOB: 1981-11-17 Today's Date: 05/31/2022   History of Present Illness 41 year old male transferred to [facility] for I&D and hardware removal R tibia on 05/26/22. PMH: HIV, morbid obesity, ORIF of the right bicondylar plateau in 2016, chronic lymphedema of RLE presented with right lower extremity cellulitis.   Clinical Impression   PT admitted with I&D R LE due to cellulitis. Pt currently with functional limitiations due to the deficits listed below (see OT problem list). Pt completed sit<>Stand with elevated bed this session min guard (A). Pt has a tarp that is able to be air filled with functional use for surgery in the bed with the patient. Due to the patients ability to stand the tarp was removed and placed on the shelf in patients room. The tarp should be replaced if advised for Wednesday surgery otherwise will remove for skin integrity.  Pt will benefit from skilled OT to increase their independence and safety with adls and balance to allow discharge CIR.       Recommendations for follow up therapy are one component of a multi-disciplinary discharge planning process, led by the attending physician.  Recommendations may be updated based on patient status, additional functional criteria and insurance authorization.   Follow Up Recommendations  Acute inpatient rehab (3hours/day)    Assistance Recommended at Discharge Set up Supervision/Assistance  Patient can return home with the following A lot of help with walking and/or transfers;A lot of help with bathing/dressing/bathroom;Assistance with cooking/housework;Assistance with feeding;Assist for transportation    Functional Status Assessment  Patient has had a recent decline in their functional status and demonstrates the ability to make significant improvements in function in a reasonable and predictable amount of time.  Equipment Recommendations   Wheelchair (measurements OT);Wheelchair cushion (measurements OT);Hospital bed;Other (comment);BSC/3in1 (bariatric DME)    Recommendations for Other Services Rehab consult     Precautions / Restrictions Precautions Precautions: Fall Precaution Comments: hemovac R LE Restrictions Weight Bearing Restrictions: No Other Position/Activity Restrictions: WBAT      Mobility Bed Mobility Overal bed mobility: Needs Assistance Bed Mobility: Supine to Sit, Sit to Supine     Supine to sit: Mod assist Sit to supine: Mod assist   General bed mobility comments: pt requires (A) of R LE the entire transfer in and out of bed. pt with able to use L LE to bridge and progress to EOB. pt using bil UE on bed rails and pulling toward EOB. pt to return supine with bed slightly tilted to help with gravity .pt was able to use bil UE and L LE to push himself to [facility].    Transfers Overall transfer level: Needs assistance Equipment used: Rolling walker (2 wheels) Transfers: Sit to/from Stand Sit to Stand: Min guard           General transfer comment: therapist required for line management and moving dirty linen out of the way      Balance Overall balance assessment: Mild deficits observed, not formally tested                                         ADL either performed or assessed with clinical judgement   ADL Overall ADL's : Needs assistance/impaired Eating/Feeding: Independent   Grooming: Wash/dry hands;Independent   Upper Body Bathing: Set up;Bed level   Lower Body Bathing: Moderate assistance   Upper Body  Dressing : Minimal assistance   Lower Body Dressing: Moderate assistance;Sit to/from Market researcher Details (indicate cue type and reason): demonstrated sit<>Stand this session so could complete a BSC placement behind him with bed swing away           General ADL Comments: pt anxious initially to dangle eob but willing. after completing transfer and  allowed extended time requested to stand to have dirty linen replaced with new linen     Vision Baseline Vision/History: 0 No visual deficits       Perception     Praxis      Pertinent Vitals/Pain Pain Assessment Pain Assessment: No/denies pain     Hand Dominance Right   Extremity/Trunk Assessment Upper Extremity Assessment Upper Extremity Assessment: Overall WFL for tasks assessed   Lower Extremity Assessment Lower Extremity Assessment: Defer to PT evaluation   Cervical / Trunk Assessment Cervical / Trunk Assessment: Kyphotic (body habitus with rounded shoulders)   Communication Communication Communication: No difficulties   Cognition Arousal/Alertness: Awake/alert Behavior During Therapy: WFL for tasks assessed/performed Overall Cognitive Status: Within Functional Limits for tasks assessed                                       General Comments  reports a sore place at the top of buttock from laying on a cap from IV bag. Pt otherwise reports no skin concerns.    Exercises     Shoulder Instructions      Home Living Family/patient expects to be discharged to:: Private residence Living Arrangements: Children Available Help at Discharge: Family Type of Home: House Home Access: Stairs to enter Secretary/administrator of Steps: 2 Entrance Stairs-Rails: Right;Left;Can reach both Home Layout: One level     Bathroom Shower/Tub: Walk-in Pensions consultant: Standard Bathroom Accessibility: Yes   Home Equipment: Shower seat - built Charity fundraiser (2 wheels)   Additional Comments: single father to 74 yo and 36 yo sons. very involved in church. mother at bedside today during session to (A)      Prior Functioning/Environment Prior Level of Function : Independent/Modified Independent;Driving;Working/employed             Mobility Comments: ind at baseline ADLs Comments: ind at baseline        OT Problem List: Decreased  strength;Decreased activity tolerance;Impaired balance (sitting and/or standing);Decreased safety awareness;Decreased knowledge of use of DME or AE;Decreased knowledge of precautions;Obesity      OT Treatment/Interventions: Self-care/ADL training;Therapeutic exercise;Energy conservation;DME and/or AE instruction;Therapeutic activities;Patient/family education;Balance training;Manual therapy;Modalities    OT Goals(Current goals can be found in the care plan section) Acute Rehab OT Goals Patient Stated Goal: to get back to his children OT Goal Formulation: With patient Time For Goal Achievement: 06/14/22 Potential to Achieve Goals: Good  OT Frequency: Min 2X/week    Co-evaluation PT/OT/SLP Co-Evaluation/Treatment: Yes Reason for Co-Treatment: Complexity of the patient's impairments (multi-system involvement);Necessary to address cognition/behavior during functional activity;For patient/therapist safety   OT goals addressed during session: ADL's and self-care;Proper use of Adaptive equipment and DME;Strengthening/ROM      AM-PAC OT "6 Clicks" Daily Activity     Outcome Measure Help from another person eating meals?: None Help from another person taking care of personal grooming?: None Help from another person toileting, which includes using toliet, bedpan, or urinal?: A Lot Help from another person bathing (including washing, rinsing, drying)?: A Lot  Help from another person to put on and taking off regular upper body clothing?: A Little Help from another person to put on and taking off regular lower body clothing?: A Lot 6 Click Score: 17   End of Session Equipment Utilized During Treatment: Gait belt;Rolling walker (2 wheels) (bariatric RW) Nurse Communication: Mobility status;Precautions;Weight bearing status  Activity Tolerance: Patient tolerated treatment well Patient left: in bed;with call bell/phone within reach  OT Visit Diagnosis: Unsteadiness on feet (R26.81);Muscle weakness  (generalized) (M62.81)                Time: [id number] OT Time Calculation (min): 43 min Charges:  OT General Charges $OT Visit: 1 Visit OT Evaluation $OT Eval Moderate Complexity: 1 Mod OT Treatments $Self Care/Home Management : 8-22 mins   Brynn, OTR/L  Acute Rehabilitation Services Office: [phone] .   Mateo FlowBrynn Sierrah Luevano 05/31/2022, 4:01 PM

## 2022-05-31 NOTE — Progress Notes (Signed)
Physical Therapy Treatment Patient Details Name: Todd Parrish MRN: [id number] DOB: 04-02-1981 Today's Date: 05/31/2022   History of Present Illness 41 year old male transferred from [facility] for I&D and hardware removal R tibia on 05/26/22. PMH: HIV, morbid obesity, ORIF of the right bicondylar plateau in 2016, chronic lymphedema of RLE presented with right lower extremity cellulitis.    PT Comments    The pt was agreeable to session with focus on evaluating OOB mobility since last I&D. The pt was able to complete bed mobility with modA of 1-2 and elevated HOB, he was able to manage sit-stand transfers with minG but is dependent on use of BUE and momentum due to deficits in LE strength and power. The pt declined further ambulation attempts at this time due to pain and fatigue after standing x2, will continue to benefit from skilled PT to progress OOB mobility, gait, and to facilitate return to full independence. The pt is motivated to progress and has good family support.    Recommendations for follow up therapy are one component of a multi-disciplinary discharge planning process, led by the attending physician.  Recommendations may be updated based on patient status, additional functional criteria and insurance authorization.  Follow Up Recommendations  Acute inpatient rehab (3hours/day)     Assistance Recommended at Discharge Frequent or constant Supervision/Assistance  Patient can return home with the following A lot of help with walking and/or transfers;A lot of help with bathing/dressing/bathroom;Assistance with cooking/housework;Direct supervision/assist for medications management;Direct supervision/assist for financial management;Assist for transportation;Help with stairs or ramp for entrance   Equipment Recommendations  None recommended by PT    Recommendations for Other Services Rehab consult     Precautions / Restrictions Precautions Precautions: Fall Precaution Comments:  hemovac R LE Restrictions Weight Bearing Restrictions: Yes Other Position/Activity Restrictions: WBAT     Mobility  Bed Mobility Overal bed mobility: Needs Assistance Bed Mobility: Supine to Sit, Sit to Supine     Supine to sit: Mod assist Sit to supine: Mod assist   General bed mobility comments: pt requires modA to R LE the entire transfer in and out of bed. pt with able to use L LE to bridge and progress to EOB. pt using bil UE on bed rails and pulling toward EOB. pt to return supine with bed slightly tilted to help with gravity .pt was able to use bil UE and L LE to push himself to [facility].    Transfers Overall transfer level: Needs assistance Equipment used: Rolling walker (2 wheels) Transfers: Sit to/from Stand Sit to Stand: Min guard           General transfer comment: therapist required for line management and moving dirty linen out of the way, increased time to rise and use of momentum. completed x2    Ambulation/Gait               General Gait Details: pt declined due to fatigue after standing x2      Balance Overall balance assessment: Mild deficits observed, not formally tested                                          Cognition Arousal/Alertness: Awake/alert Behavior During Therapy: WFL for tasks assessed/performed Overall Cognitive Status: Within Functional Limits for tasks assessed  General Comments: pt with mild anxiety regarding movement        Exercises      General Comments General comments (skin integrity, edema, etc.): reports a sore place at the top of buttock from laying on a cap from IV bag. Pt otherwise reports no skin concerns.      Pertinent Vitals/Pain Pain Assessment Pain Assessment: Faces Faces Pain Scale: Hurts little more Pain Location: R knee with movement Pain Descriptors / Indicators: Discomfort Pain Intervention(s): Limited activity within patient's  tolerance, Monitored during session, Repositioned    Home Living Family/patient expects to be discharged to:: Private residence Living Arrangements: Children Available Help at Discharge: Family Type of Home: House Home Access: Stairs to enter Entrance Stairs-Rails: Right;Left;Can reach both Technical brewer of Steps: 2   Home Layout: One level Home Equipment: Shower seat - built Medical sales representative (2 wheels) Additional Comments: single father to 43 yo and 36 yo sons. very involved in church. mother at bedside today during session to (A)    Prior Function            PT Goals (current goals can now be found in the care plan section) Acute Rehab PT Goals Patient Stated Goal: Get well and go home PT Goal Formulation: With patient Time For Goal Achievement: 06/14/22 Potential to Achieve Goals: Good    Frequency    Min 3X/week           Co-evaluation PT/OT/SLP Co-Evaluation/Treatment: Yes Reason for Co-Treatment: For patient/therapist safety;To address functional/ADL transfers PT goals addressed during session: Mobility/safety with mobility;Balance;Proper use of DME;Strengthening/ROM OT goals addressed during session: ADL's and self-care;Proper use of Adaptive equipment and DME;Strengthening/ROM      AM-PAC PT "6 Clicks" Mobility   Outcome Measure  Help needed turning from your back to your side while in a flat bed without using bedrails?: A Lot Help needed moving from lying on your back to sitting on the side of a flat bed without using bedrails?: A Lot Help needed moving to and from a bed to a chair (including a wheelchair)?: A Lot Help needed standing up from a chair using your arms (e.g., wheelchair or bedside chair)?: A Little Help needed to walk in hospital room?: Total (unable to ambulate 20 ft) Help needed climbing 3-5 steps with a railing? : Total 6 Click Score: 11    End of Session Equipment Utilized During Treatment: Gait belt Activity Tolerance:  Patient limited by fatigue Patient left: in bed;with call bell/phone within reach Nurse Communication: Mobility status PT Visit Diagnosis: Other abnormalities of gait and mobility (R26.89);Pain Pain - Right/Left: Right Pain - part of body: Knee     Time: 1459-1541 PT Time Calculation (min) (ACUTE ONLY): 42 min  Charges:  $Therapeutic Exercise: 8-22 mins                     West Carbo, PT, DPT   Acute Rehabilitation Department   Sandra Cockayne 05/31/2022, 6:04 PM
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
## 2022-06-01 DIAGNOSIS — L03115 Cellulitis of right lower limb: Secondary | ICD-10-CM | POA: Diagnosis not present

## 2022-06-01 LAB — BASIC METABOLIC PANEL
Anion gap: 7 (ref 5–15)
BUN: 13 mg/dL (ref 6–20)
CO2: 28 mmol/L (ref 22–32)
Calcium: 8.1 mg/dL — ABNORMAL LOW (ref 8.9–10.3)
Chloride: 100 mmol/L (ref 98–111)
Creatinine, Ser: 0.71 mg/dL (ref 0.61–1.24)
GFR, Estimated: >60 mL/min (ref >60)
Glucose, Bld: 142 mg/dL — ABNORMAL HIGH (ref 70–99)
Potassium: 3.7 mmol/L (ref 3.5–5.1)
Sodium: 135 mmol/L (ref 135–145)

## 2022-06-01 LAB — CBC
HCT: 25.6 % — ABNORMAL LOW (ref 39.0–52.0)
Hemoglobin: 7.9 g/dL — ABNORMAL LOW (ref 13.0–17.0)
MCH: 24.9 pg — ABNORMAL LOW (ref 26.0–34.0)
MCHC: 30.9 g/dL (ref 30.0–36.0)
MCV: 80.8 fL (ref 80.0–100.0)
Platelets: 496 10*3/uL — ABNORMAL HIGH (ref 150–400)
RBC: 3.17 MIL/uL — ABNORMAL LOW (ref 4.22–5.81)
RDW: 15.9 % — ABNORMAL HIGH (ref 11.5–15.5)
WBC: 18.1 10*3/uL — ABNORMAL HIGH (ref 4.0–10.5)
nRBC: 0.3 % — ABNORMAL HIGH (ref 0.0–0.2)

## 2022-06-01 NOTE — Progress Notes (Signed)
Patient ID: Todd Parrish, male   DOB: 12/10/1981, 41 y.o.   MRN: [id number] Patient is seen in follow-up status post irrigation debridement right tibial plateau.  There was 250 cc in the wound VAC canister on Saturday there is 450 cc in the wound VAC canister this morning.  Patient will need return to the operating room anticipate placement of tissue graft.  Patient will need long-term IV antibiotics.

## 2022-06-01 NOTE — H&P (View-Only) (Signed)
Patient ID: Todd Parrish, male   DOB: 06/10/1981, 40 y.o.   MRN: [id number] Patient is seen in follow-up status post irrigation debridement right tibial plateau.  There was 250 cc in the wound VAC canister on Saturday there is 450 cc in the wound VAC canister this morning.  Patient will need return to the operating room anticipate placement of tissue graft.  Patient will need long-term IV antibiotics. 
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
## 2022-06-01 NOTE — Progress Notes (Signed)
Inpatient Rehab Admissions Coordinator:   Note plans for further surgery Wednesday.  Will f/u after surgery to discuss rehab needs.   Shann Medal, PT, DPT Admissions Coordinator [phone] 06/01/22  2:21 PM

## 2022-06-02 DIAGNOSIS — B20 Human immunodeficiency virus [HIV] disease: Secondary | ICD-10-CM | POA: Diagnosis not present

## 2022-06-02 DIAGNOSIS — S81801A Unspecified open wound, right lower leg, initial encounter: Secondary | ICD-10-CM | POA: Diagnosis not present

## 2022-06-02 DIAGNOSIS — D638 Anemia in other chronic diseases classified elsewhere: Secondary | ICD-10-CM | POA: Diagnosis not present

## 2022-06-02 DIAGNOSIS — L02415 Cutaneous abscess of right lower limb: Secondary | ICD-10-CM | POA: Diagnosis not present

## 2022-06-02 DIAGNOSIS — I10 Essential (primary) hypertension: Secondary | ICD-10-CM | POA: Diagnosis not present

## 2022-06-02 DIAGNOSIS — Z6841 Body Mass Index (BMI) 40.0 and over, adult: Secondary | ICD-10-CM | POA: Diagnosis not present

## 2022-06-02 DIAGNOSIS — L03115 Cellulitis of right lower limb: Secondary | ICD-10-CM | POA: Diagnosis not present

## 2022-06-02 LAB — BASIC METABOLIC PANEL
Anion gap: 10 (ref 5–15)
BUN: 14 mg/dL (ref 6–20)
CO2: 28 mmol/L (ref 22–32)
Calcium: 8.3 mg/dL — ABNORMAL LOW (ref 8.9–10.3)
Chloride: 95 mmol/L — ABNORMAL LOW (ref 98–111)
Creatinine, Ser: 0.88 mg/dL (ref 0.61–1.24)
GFR, Estimated: >60 mL/min (ref >60)
Glucose, Bld: 142 mg/dL — ABNORMAL HIGH (ref 70–99)
Potassium: 4 mmol/L (ref 3.5–5.1)
Sodium: 133 mmol/L — ABNORMAL LOW (ref 135–145)

## 2022-06-02 LAB — AEROBIC/ANAEROBIC CULTURE W GRAM STAIN (SURGICAL/DEEP WOUND)
Culture: NO GROWTH
Gram Stain: NONE SEEN

## 2022-06-02 LAB — CBC
HCT: 26.1 % — ABNORMAL LOW (ref 39.0–52.0)
Hemoglobin: 7.8 g/dL — ABNORMAL LOW (ref 13.0–17.0)
MCH: 24.1 pg — ABNORMAL LOW (ref 26.0–34.0)
MCHC: 29.9 g/dL — ABNORMAL LOW (ref 30.0–36.0)
MCV: 80.8 fL (ref 80.0–100.0)
Platelets: 493 10*3/uL — ABNORMAL HIGH (ref 150–400)
RBC: 3.23 MIL/uL — ABNORMAL LOW (ref 4.22–5.81)
RDW: 16.6 % — ABNORMAL HIGH (ref 11.5–15.5)
WBC: 17 10*3/uL — ABNORMAL HIGH (ref 4.0–10.5)
nRBC: 0.2 % (ref 0.0–0.2)

## 2022-06-02 LAB — SURGICAL PATHOLOGY

## 2022-06-02 MED ORDER — ENSURE ENLIVE PO LIQD
237.0000 mL | Freq: Two times a day (BID) | ORAL | Status: DC
  Administered 2022-06-03 – 2022-06-05 (×4): 237 mL via ORAL

## 2022-06-02 NOTE — Progress Notes (Signed)
Pharmacy Antibiotic Note  Todd Parrish is a 41 y.o. male admitted on 05/22/2022 with RLE abscess, concern for osteo of tibia, planning I&D with hardware removal by ortho.  Pharmacy has been consulted for Daptomycin and Flagyl dosing.   Plan: - Continue Daptomycin 800 mg (8 mg/kg AdjBW) every 24 hours - Continue Flagyl 500 mg po every 12 hours  - Will check baseline CK but expect elevation given recent surgeries, will follow renal fxn, and additional culture results -OPAT entered with stop date of 07/11/22  Height: 5\' 7"  (170.2 cm) Weight: (!) 154.2 kg (339 lb 15.2 oz) IBW/kg (Calculated) : 66.1  Temp (24hrs), Avg:98 F (36.7 C), Min:97.7 F (36.5 C), Max:98.5 F (36.9 C)  Recent Labs  Lab 05/26/22 1625 05/27/22 0312 05/28/22 0325 05/30/22 0235 05/31/22 0221 06/01/22 0205 06/02/22 0443  WBC  --    < > 18.2* 20.1* 20.1* 18.1* 17.0*  CREATININE  --    < > 0.75 0.67 0.83 0.71 0.88  VANCOTROUGH 15  --   --   --   --   --   --    < > = values in this interval not displayed.     Estimated Creatinine Clearance: 156.7 mL/min (by C-G formula based on SCr of 0.88 mg/dL).    Allergies  Allergen Reactions   Morphine And Related Nausea And Vomiting and Other (See Comments)    Elevated blood pressure    Pork-Derived Products Other (See Comments)    No pork for religious reasons - doesn't eat pork No pork for religious reasons   Shellfish Allergy Other (See Comments)    No shrimp for religious reasons -doesn't eat shrimp religious puroses No shrimp for religious reasons    Ivory Bail A. 06/04/22, PharmD, BCPS, FNKF Clinical Pharmacist  Please utilize Amion for appropriate phone number to reach the unit pharmacist [facility])

## 2022-06-02 NOTE — Progress Notes (Signed)
Physical Therapy Treatment Patient Details Name: Todd Parrish MRN: [id number] DOB: 1981-09-19 Today's Date: 06/02/2022   History of Present Illness 41 year old male transferred from [facility] for I&D and hardware removal R tibia on 05/26/22. RLE debridement on 6/9. PMH: HIV, morbid obesity, ORIF of the right bicondylar plateau in 2016, chronic lymphedema of RLE presented with right lower extremity cellulitis.    PT Comments    Pt demonstrating hesitancy to engage with therapy but does so with encouragement. Increased time required to initiate movements due to anxiety. Requiring modA for bed mobility and minA for STS transfer. Pt able to side step along EOB to progress toward ambulation. Pt declining further ambulation due to feeling hot and lightheaded but BP overall was stable. Pt would benefit from AIR upon discharge to address deficits in strength, balance, and functional mobility in order to return to PLOF.    Recommendations for follow up therapy are one component of a multi-disciplinary discharge planning process, led by the attending physician.  Recommendations may be updated based on patient status, additional functional criteria and insurance authorization.  Follow Up Recommendations  Acute inpatient rehab (3hours/day)     Assistance Recommended at Discharge Frequent or constant Supervision/Assistance  Patient can return home with the following A lot of help with walking and/or transfers;A little help with bathing/dressing/bathroom;Assistance with cooking/housework;Assist for transportation;Help with stairs or ramp for entrance   Equipment Recommendations  None recommended by PT    Recommendations for Other Services       Precautions / Restrictions Precautions Precautions: Fall Precaution Comments: wound vac R LE Restrictions Weight Bearing Restrictions: Yes RLE Weight Bearing: Weight bearing as tolerated     Mobility  Bed Mobility Overal bed mobility: Needs  Assistance Bed Mobility: Supine to Sit, Sit to Supine     Supine to sit: Mod assist, +2 for physical assistance Sit to supine: Mod assist, +2 for physical assistance   General bed mobility comments: requiring assist for RLE and bed pad assist    Transfers Overall transfer level: Needs assistance Equipment used: Rolling walker (2 wheels) Transfers: Sit to/from Stand Sit to Stand: Min assist           General transfer comment: min assist to help maintain balance, no assist for power up. requiring increased time to initiate due to anxiety    Ambulation/Gait             Pre-gait activities: side stepping to [facility] with minA and RW     Stairs             Wheelchair Mobility    Modified Rankin (Stroke Patients Only)       Balance Overall balance assessment: Needs assistance Sitting-balance support: Bilateral upper extremity supported, Feet supported Sitting balance-Leahy Scale: Good     Standing balance support: Bilateral upper extremity supported Standing balance-Leahy Scale: Poor Standing balance comment: reliant on RW for support                            Cognition Arousal/Alertness: Awake/alert Behavior During Therapy: WFL for tasks assessed/performed, Anxious Overall Cognitive Status: Within Functional Limits for tasks assessed                                 General Comments: pt with mild anxiety regarding movement        Exercises      General Comments  Pertinent Vitals/Pain Pain Assessment Pain Assessment: Faces Faces Pain Scale: Hurts little more Pain Location: R knee with movement Pain Descriptors / Indicators: Discomfort Pain Intervention(s): Limited activity within patient's tolerance, RN gave pain meds during session    Home Living                          Prior Function            PT Goals (current goals can now be found in the care plan section) Acute Rehab PT Goals Patient  Stated Goal: Get well and go home PT Goal Formulation: With patient Time For Goal Achievement: 06/14/22 Potential to Achieve Goals: Good Progress towards PT goals: Progressing toward goals    Frequency    Min 3X/week      PT Plan Current plan remains appropriate    Co-evaluation              AM-PAC PT "6 Clicks" Mobility   Outcome Measure  Help needed turning from your back to your side while in a flat bed without using bedrails?: A Lot Help needed moving from lying on your back to sitting on the side of a flat bed without using bedrails?: A Lot Help needed moving to and from a bed to a chair (including a wheelchair)?: A Lot Help needed standing up from a chair using your arms (e.g., wheelchair or bedside chair)?: A Little Help needed to walk in hospital room?: Total Help needed climbing 3-5 steps with a railing? : Total 6 Click Score: 11    End of Session Equipment Utilized During Treatment: Gait belt Activity Tolerance: Patient limited by fatigue Patient left: in bed;with call bell/phone within reach Nurse Communication: Mobility status PT Visit Diagnosis: Other abnormalities of gait and mobility (R26.89);Pain Pain - Right/Left: Right Pain - part of body: Knee     Time: 1400-1440 PT Time Calculation (min) (ACUTE ONLY): 40 min  Charges:  $Therapeutic Activity: 38-52 mins                    Davina Poke, SPT Acute Rehabilitation Services  Office: [phone]    Davina Poke 06/02/2022, 4:10 PM
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
## 2022-06-03 ENCOUNTER — Inpatient Hospital Stay (HOSPITAL_COMMUNITY): Payer: Medicaid Other | Admitting: Anesthesiology

## 2022-06-03 ENCOUNTER — Other Ambulatory Visit: Payer: Self-pay

## 2022-06-03 ENCOUNTER — Encounter (HOSPITAL_COMMUNITY)
Admission: EM | Disposition: A | Discharge status: Home Health | Payer: Self-pay | Source: Home / Self Care | Attending: Internal Medicine

## 2022-06-03 DIAGNOSIS — L02415 Cutaneous abscess of right lower limb: Secondary | ICD-10-CM | POA: Diagnosis not present

## 2022-06-03 DIAGNOSIS — D638 Anemia in other chronic diseases classified elsewhere: Secondary | ICD-10-CM | POA: Diagnosis not present

## 2022-06-03 DIAGNOSIS — B20 Human immunodeficiency virus [HIV] disease: Secondary | ICD-10-CM | POA: Diagnosis not present

## 2022-06-03 DIAGNOSIS — D649 Anemia, unspecified: Secondary | ICD-10-CM | POA: Diagnosis not present

## 2022-06-03 DIAGNOSIS — I10 Essential (primary) hypertension: Secondary | ICD-10-CM | POA: Diagnosis not present

## 2022-06-03 DIAGNOSIS — M869 Osteomyelitis, unspecified: Secondary | ICD-10-CM

## 2022-06-03 DIAGNOSIS — L03115 Cellulitis of right lower limb: Secondary | ICD-10-CM | POA: Diagnosis not present

## 2022-06-03 DIAGNOSIS — Z6841 Body Mass Index (BMI) 40.0 and over, adult: Secondary | ICD-10-CM | POA: Diagnosis not present

## 2022-06-03 DIAGNOSIS — S81801A Unspecified open wound, right lower leg, initial encounter: Secondary | ICD-10-CM | POA: Diagnosis not present

## 2022-06-03 HISTORY — PX: I & D EXTREMITY: SHX5045

## 2022-06-03 LAB — BASIC METABOLIC PANEL
Anion gap: 4 — ABNORMAL LOW (ref 5–15)
BUN: 12 mg/dL (ref 6–20)
CO2: 30 mmol/L (ref 22–32)
Calcium: 8.2 mg/dL — ABNORMAL LOW (ref 8.9–10.3)
Chloride: 100 mmol/L (ref 98–111)
Creatinine, Ser: 0.71 mg/dL (ref 0.61–1.24)
GFR, Estimated: >60 mL/min (ref >60)
Glucose, Bld: 115 mg/dL — ABNORMAL HIGH (ref 70–99)
Potassium: 4.4 mmol/L (ref 3.5–5.1)
Sodium: 134 mmol/L — ABNORMAL LOW (ref 135–145)

## 2022-06-03 LAB — CBC
HCT: 25.7 % — ABNORMAL LOW (ref 39.0–52.0)
Hemoglobin: 7.7 g/dL — ABNORMAL LOW (ref 13.0–17.0)
MCH: 23.9 pg — ABNORMAL LOW (ref 26.0–34.0)
MCHC: 30 g/dL (ref 30.0–36.0)
MCV: 79.8 fL — ABNORMAL LOW (ref 80.0–100.0)
Platelets: 454 10*3/uL — ABNORMAL HIGH (ref 150–400)
RBC: 3.22 MIL/uL — ABNORMAL LOW (ref 4.22–5.81)
RDW: 17.1 % — ABNORMAL HIGH (ref 11.5–15.5)
WBC: 14.5 10*3/uL — ABNORMAL HIGH (ref 4.0–10.5)
nRBC: 0.1 % (ref 0.0–0.2)

## 2022-06-03 LAB — MAGNESIUM: Magnesium: 1.9 mg/dL (ref 1.7–2.4)

## 2022-06-03 SURGERY — IRRIGATION AND DEBRIDEMENT EXTREMITY
Anesthesia: General | Site: Leg Lower | Laterality: Right

## 2022-06-03 MED ORDER — FENTANYL CITRATE (PF) 250 MCG/5ML IJ SOLN
INTRAMUSCULAR | Status: AC
  Filled 2022-06-03: qty 5

## 2022-06-03 MED ORDER — FENTANYL CITRATE (PF) 100 MCG/2ML IJ SOLN
INTRAMUSCULAR | Status: AC
  Filled 2022-06-03: qty 2

## 2022-06-03 MED ORDER — LACTATED RINGERS IV SOLN: INTRAVENOUS | Status: DC

## 2022-06-03 MED ORDER — HYDROMORPHONE HCL 1 MG/ML IJ SOLN: 0.5000 mg | INTRAMUSCULAR | Status: DC | PRN

## 2022-06-03 MED ORDER — METOCLOPRAMIDE HCL 5 MG PO TABS: 5.0000 mg | ORAL_TABLET | Freq: Three times a day (TID) | ORAL | Status: DC | PRN

## 2022-06-03 MED ORDER — DOCUSATE SODIUM 100 MG PO CAPS
100.0000 mg | ORAL_CAPSULE | Freq: Two times a day (BID) | ORAL | Status: DC
  Administered 2022-06-04: 100 mg via ORAL
  Filled 2022-06-03 (×4): qty 1

## 2022-06-03 MED ORDER — MIDAZOLAM HCL 2 MG/2ML IJ SOLN
INTRAMUSCULAR | Status: AC
  Filled 2022-06-03: qty 2

## 2022-06-03 MED ORDER — PHENYLEPHRINE 80 MCG/ML (10ML) SYRINGE FOR IV PUSH (FOR BLOOD PRESSURE SUPPORT)
PREFILLED_SYRINGE | INTRAVENOUS | Status: DC | PRN
  Administered 2022-06-03: 160 ug via INTRAVENOUS

## 2022-06-03 MED ORDER — FENTANYL CITRATE (PF) 250 MCG/5ML IJ SOLN
INTRAMUSCULAR | Status: DC | PRN
  Administered 2022-06-03 (×3): 50 ug via INTRAVENOUS
  Administered 2022-06-03: 100 ug via INTRAVENOUS

## 2022-06-03 MED ORDER — ACETAMINOPHEN 10 MG/ML IV SOLN
INTRAVENOUS | Status: AC
  Filled 2022-06-03: qty 100

## 2022-06-03 MED ORDER — ORAL CARE MOUTH RINSE
15.0000 mL | Freq: Once | OROMUCOSAL | Status: AC
Start: 2022-06-03 — End: 2022-06-03

## 2022-06-03 MED ORDER — ONDANSETRON HCL 4 MG PO TABS: 4.0000 mg | ORAL_TABLET | Freq: Four times a day (QID) | ORAL | Status: DC | PRN

## 2022-06-03 MED ORDER — METHOCARBAMOL 500 MG PO TABS: 500.0000 mg | ORAL_TABLET | Freq: Four times a day (QID) | ORAL | Status: DC | PRN

## 2022-06-03 MED ORDER — CEFAZOLIN IN SODIUM CHLORIDE 3-0.9 GM/100ML-% IV SOLN
3.0000 g | INTRAVENOUS | Status: AC
  Administered 2022-06-03: 3 g via INTRAVENOUS
  Filled 2022-06-03: qty 100

## 2022-06-03 MED ORDER — METHOCARBAMOL 1000 MG/10ML IJ SOLN: 500.0000 mg | Freq: Four times a day (QID) | INTRAVENOUS | Status: DC | PRN

## 2022-06-03 MED ORDER — LIDOCAINE 2% (20 MG/ML) 5 ML SYRINGE
INTRAMUSCULAR | Status: DC | PRN
  Administered 2022-06-03: 60 mg via INTRAVENOUS

## 2022-06-03 MED ORDER — OXYCODONE HCL 5 MG PO TABS
10.0000 mg | ORAL_TABLET | ORAL | Status: DC | PRN
  Filled 2022-06-03: qty 3

## 2022-06-03 MED ORDER — ONDANSETRON HCL 4 MG/2ML IJ SOLN
INTRAMUSCULAR | Status: DC | PRN
  Administered 2022-06-03: 4 mg via INTRAVENOUS

## 2022-06-03 MED ORDER — 0.9 % SODIUM CHLORIDE (POUR BTL) OPTIME
TOPICAL | Status: DC | PRN
  Administered 2022-06-03: 1000 mL

## 2022-06-03 MED ORDER — ACETAMINOPHEN 10 MG/ML IV SOLN
1000.0000 mg | Freq: Once | INTRAVENOUS | Status: DC | PRN
  Administered 2022-06-03: 1000 mg via INTRAVENOUS

## 2022-06-03 MED ORDER — BISACODYL 10 MG RE SUPP: 10.0000 mg | Freq: Every day | RECTAL | Status: DC | PRN

## 2022-06-03 MED ORDER — OXYCODONE HCL 5 MG PO TABS: 5.0000 mg | ORAL_TABLET | ORAL | Status: DC | PRN

## 2022-06-03 MED ORDER — PROPOFOL 10 MG/ML IV BOLUS
INTRAVENOUS | Status: AC
  Filled 2022-06-03: qty 20

## 2022-06-03 MED ORDER — FENTANYL CITRATE (PF) 100 MCG/2ML IJ SOLN
25.0000 ug | INTRAMUSCULAR | Status: DC | PRN
  Administered 2022-06-03 (×3): 50 ug via INTRAVENOUS

## 2022-06-03 MED ORDER — MIDAZOLAM HCL 2 MG/2ML IJ SOLN
INTRAMUSCULAR | Status: DC | PRN
  Administered 2022-06-03: 2 mg via INTRAVENOUS

## 2022-06-03 MED ORDER — DEXAMETHASONE SODIUM PHOSPHATE 10 MG/ML IJ SOLN
INTRAMUSCULAR | Status: DC | PRN
  Administered 2022-06-03: 8 mg via INTRAVENOUS

## 2022-06-03 MED ORDER — CHLORHEXIDINE GLUCONATE 4 % EX LIQD: 60.0000 mL | Freq: Once | CUTANEOUS | Status: DC

## 2022-06-03 MED ORDER — CHLORHEXIDINE GLUCONATE 0.12 % MT SOLN
15.0000 mL | Freq: Once | OROMUCOSAL | Status: AC
Start: 2022-06-03 — End: 2022-06-03

## 2022-06-03 MED ORDER — ONDANSETRON HCL 4 MG/2ML IJ SOLN: 4.0000 mg | Freq: Four times a day (QID) | INTRAMUSCULAR | Status: DC | PRN

## 2022-06-03 MED ORDER — SODIUM CHLORIDE 0.9 % IV SOLN: INTRAVENOUS | Status: DC

## 2022-06-03 MED ORDER — PROPOFOL 10 MG/ML IV BOLUS
INTRAVENOUS | Status: DC | PRN
  Administered 2022-06-03: 180 mg via INTRAVENOUS

## 2022-06-03 MED ORDER — POVIDONE-IODINE 10 % EX SWAB: 2.0000 "application " | Freq: Once | CUTANEOUS | Status: DC

## 2022-06-03 MED ORDER — ACETAMINOPHEN 325 MG PO TABS
325.0000 mg | ORAL_TABLET | Freq: Four times a day (QID) | ORAL | Status: DC | PRN
  Administered 2022-06-04: 650 mg via ORAL
  Filled 2022-06-03: qty 2

## 2022-06-03 MED ORDER — POLYETHYLENE GLYCOL 3350 17 G PO PACK: 17.0000 g | PACK | Freq: Every day | ORAL | Status: DC | PRN

## 2022-06-03 MED ORDER — CHLORHEXIDINE GLUCONATE 0.12 % MT SOLN
OROMUCOSAL | Status: AC
  Administered 2022-06-03: 15 mL via OROMUCOSAL
  Filled 2022-06-03: qty 15

## 2022-06-03 MED ORDER — METOCLOPRAMIDE HCL 5 MG/ML IJ SOLN: 5.0000 mg | Freq: Three times a day (TID) | INTRAMUSCULAR | Status: DC | PRN

## 2022-06-03 SURGICAL SUPPLY — 38 items
BAG COUNTER SPONGE SURGICOUNT (BAG) IMPLANT
BLADE SURG 21 STRL SS (BLADE) ×2 IMPLANT
BNDG COHESIVE 6X5 TAN STRL LF (GAUZE/BANDAGES/DRESSINGS) IMPLANT
BNDG GAUZE ELAST 4 BULKY (GAUZE/BANDAGES/DRESSINGS) ×2 IMPLANT
COVER SURGICAL LIGHT HANDLE (MISCELLANEOUS) ×3 IMPLANT
DRAPE DERMATAC (DRAPES) ×2 IMPLANT
DRAPE U-SHAPE 47X51 STRL (DRAPES) ×2 IMPLANT
DRESSING PEEL AND PLAC PRVNA20 (GAUZE/BANDAGES/DRESSINGS) IMPLANT
DRESSING VERAFLO CLEANS CC MED (GAUZE/BANDAGES/DRESSINGS) IMPLANT
DRSG ADAPTIC 3X8 NADH LF (GAUZE/BANDAGES/DRESSINGS) ×1 IMPLANT
DRSG PEEL AND PLACE PREVENA 20 (GAUZE/BANDAGES/DRESSINGS) ×2
DRSG VERAFLO CLEANSE CC MED (GAUZE/BANDAGES/DRESSINGS) ×2
DURAPREP 26ML APPLICATOR (WOUND CARE) ×2 IMPLANT
ELECT REM PT RETURN 9FT ADLT (ELECTROSURGICAL)
ELECTRODE REM PT RTRN 9FT ADLT (ELECTROSURGICAL) IMPLANT
GAUZE SPONGE 4X4 12PLY STRL (GAUZE/BANDAGES/DRESSINGS) ×1 IMPLANT
GLOVE BIOGEL PI IND STRL 9 (GLOVE) ×1 IMPLANT
GLOVE BIOGEL PI INDICATOR 9 (GLOVE) ×1
GLOVE SURG ORTHO 9.0 STRL STRW (GLOVE) ×2 IMPLANT
GOWN STRL REUS W/ TWL XL LVL3 (GOWN DISPOSABLE) ×2 IMPLANT
GOWN STRL REUS W/TWL XL LVL3 (GOWN DISPOSABLE) ×2
GRAFT SKIN WND SURGICLOSE M95 (Tissue) ×1 IMPLANT
HANDPIECE INTERPULSE COAX TIP (DISPOSABLE)
KIT BASIN OR (CUSTOM PROCEDURE TRAY) ×2 IMPLANT
KIT TURNOVER KIT B (KITS) ×2 IMPLANT
MANIFOLD NEPTUNE II (INSTRUMENTS) ×2 IMPLANT
NS IRRIG 1000ML POUR BTL (IV SOLUTION) ×2 IMPLANT
PACK ORTHO EXTREMITY (CUSTOM PROCEDURE TRAY) ×2 IMPLANT
PAD ARMBOARD 7.5X6 YLW CONV (MISCELLANEOUS) ×4 IMPLANT
PAD NEG PRESSURE SENSATRAC (MISCELLANEOUS) ×1 IMPLANT
SET HNDPC FAN SPRY TIP SCT (DISPOSABLE) IMPLANT
STOCKINETTE IMPERVIOUS 9X36 MD (GAUZE/BANDAGES/DRESSINGS) IMPLANT
SUT ETHILON 2 0 PSLX (SUTURE) ×5 IMPLANT
SWAB COLLECTION DEVICE MRSA (MISCELLANEOUS) ×2 IMPLANT
SWAB CULTURE ESWAB REG 1ML (MISCELLANEOUS) IMPLANT
TOWEL GREEN STERILE (TOWEL DISPOSABLE) ×2 IMPLANT
TUBE CONNECTING 12X1/4 (SUCTIONS) ×2 IMPLANT
YANKAUER SUCT BULB TIP NO VENT (SUCTIONS) ×2 IMPLANT

## 2022-06-03 NOTE — Anesthesia Preprocedure Evaluation (Addendum)
Anesthesia Evaluation  Patient identified by MRN, date of birth, ID band Patient awake    Reviewed: Allergy & Precautions, NPO status , Patient's Chart, lab work & pertinent test results  Airway Mallampati: III  TM Distance: >3 FB Neck ROM: Full    Dental no notable dental hx.    Pulmonary neg pulmonary ROS,    Pulmonary exam normal        Cardiovascular hypertension,  Rhythm:Regular Rate:Normal     Neuro/Psych negative neurological ROS  negative psych ROS   GI/Hepatic Neg liver ROS, GERD  ,  Endo/Other  negative endocrine ROS  Renal/GU negative Renal ROS  negative genitourinary   Musculoskeletal Right tibial plateua    Abdominal Normal abdominal exam  (+)   Peds  Hematology  (+) Blood dyscrasia, anemia ,   Anesthesia Other Findings   Reproductive/Obstetrics                            Anesthesia Physical Anesthesia Plan  ASA: 3  Anesthesia Plan: General   Post-op Pain Management:    Induction: Intravenous  PONV Risk Score and Plan: 2 and Ondansetron, Dexamethasone, Midazolam and Treatment may vary due to age or medical condition  Airway Management Planned: Mask and LMA  Additional Equipment: None  Intra-op Plan:   Post-operative Plan: Extubation in OR  Informed Consent: I have reviewed the patients History and Physical, chart, labs and discussed the procedure including the risks, benefits and alternatives for the proposed anesthesia with the patient or authorized representative who has indicated his/her understanding and acceptance.     Dental advisory given  Plan Discussed with: CRNA  Anesthesia Plan Comments: (Lab Results      Component                Value               Date                      WBC                      14.5 (H)            06/03/2022                HGB                      7.7 (L)             06/03/2022                HCT                      25.7  (L)            06/03/2022                MCV                      79.8 (L)            06/03/2022                PLT                      454 (H)             06/03/2022  Lab Results      Component                Value               Date                      NA                       134 (L)             06/03/2022                K                        4.4                 06/03/2022                CO2                      30                  06/03/2022                GLUCOSE                  115 (H)             06/03/2022                BUN                      12                  06/03/2022                CREATININE               0.71                06/03/2022                CALCIUM                  8.2 (L)             06/03/2022                GFRNONAA                 >60                 06/03/2022          )        Anesthesia Quick Evaluation
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
## 2022-06-03 NOTE — Op Note (Addendum)
06/03/2022  12:48 PM  PATIENT:  Todd Parrish    PRE-OPERATIVE DIAGNOSIS:  Osteomyelitis Right Tibial Plateau  POST-OPERATIVE DIAGNOSIS:  Same  PROCEDURE:  RIGHT TIBIA DEBRIDEMENT WITH EXCISION SKIN,  SOFT TISSUE, MUSCLE, FASCIA, AND BONE. Local tissue rearrangement for wound closure 25 x 9 cm.  225 cm. Application of Kerecis tissue graft 95 cm. Application of Prevena incisional wound VAC 20 cm.  SURGEON:  Nadara Mustard, MD  PHYSICIAN ASSISTANT:None ANESTHESIA:   General  PREOPERATIVE INDICATIONS:  Todd Parrish is a  41 y.o. male with a diagnosis of Osteomyelitis Right Tibial Plateau who failed conservative measures and elected for surgical management.    The risks benefits and alternatives were discussed with the patient preoperatively including but not limited to the risks of infection, bleeding, nerve injury, cardiopulmonary complications, the need for revision surgery, among others, and the patient was willing to proceed.  OPERATIVE IMPLANTS: Kerecis tissue graft 95 cm  @ENCIMAGES @  OPERATIVE FINDINGS: Patient had healthy viable muscle no recurrent purulence there was good granulation tissue over the muscle bellies.  OPERATIVE PROCEDURE: Patient was brought the operating room and underwent a general anesthetic.  After adequate levels anesthesia were obtained patient's right lower extremity was prepped using DuraPrep draped into a sterile field a timeout was called.  An elliptical incision was made around the previous wound this left a wound that was 25 x 9 cm.  A rondure 21 blade knife and Cobb elevator was used to further debride the skin and soft tissue muscle fascia and bone.  All tissue margins were clear and healthy.  After debridement the wound was irrigated with normal saline.  The local tissue incision was extended and soft tissue undermined to allow for local tissue rearrangement for wound closure 25 x 9 cm.  2-0 nylon was used for wound closure.  A 20  cm incisional wound VAC was applied.  There was one draining sinus tract that was debrided further with a rondure and this was covered with the cleanse choice wound VAC sponge.  The wound VAC had a good suction fit patient was extubated taken the PACU in stable condition.  Debridement type: Excisional Debridement  Side: right  Body Location: leg   Tools used for debridement: scalpel, scissors, and rongeur  Pre-debridement Wound size (cm):   Length: 20        Width: 5     Depth: 5   Post-debridement Wound size (cm):   Length: 25        Width: 9     Depth: 5   Debridement depth beyond dead/damaged tissue down to healthy viable tissue: yes  Tissue layer involved: skin, subcutaneous tissue, muscle / fascia, bone  Nature of tissue removed: Devitalized Tissue and Non-viable tissue  Irrigation volume: 1 liter     Irrigation fluid type: Normal Saline    DISCHARGE PLANNING:  Antibiotic duration: Continue antibiotics as per infectious disease  Weightbearing: Weightbearing as tolerated on the right  Pain medication: Opioid pathway  Dressing care/ Wound VAC: Continue with the wound VAC for 1 week and patient will discharge with the Praveena plus portable pump.  Ambulatory devices: Walker or wheelchair.  Discharge to: Anticipate discharge to home.  Follow-up: In the office 1 week post operative.

## 2022-06-03 NOTE — Transfer of Care (Signed)
Immediate Anesthesia Transfer of Care Note  Patient: [employer]  Procedure(s) Performed: RIGHT TIBIA DEBRIDEMENT AND SKIN GRAFT (Right: Leg Lower)  Patient Location: PACU  Anesthesia Type:General  Level of Consciousness: awake  Airway & Oxygen Therapy: Patient Spontanous Breathing  Post-op Assessment: Report given to RN and Post -op Vital signs reviewed and stable  Post vital signs: Reviewed and stable  Last Vitals:  Vitals Value Taken Time  BP    Temp    Pulse 86 06/03/22 1236  Resp    SpO2 91 % 06/03/22 1236  Vitals shown include unvalidated device data.  Last Pain:  Vitals:   06/03/22 1019  TempSrc: Oral  PainSc:       Patients Stated Pain Goal: 0 (05/31/22 2302)  Complications: No notable events documented.

## 2022-06-03 NOTE — Anesthesia Procedure Notes (Signed)
Procedure Name: LMA Insertion Date/Time: 06/03/2022 11:45 AM  Performed by: Darryl Nestle, CRNAPre-anesthesia Checklist: Patient identified, Emergency Drugs available, Suction available and Patient being monitored Patient Re-evaluated:Patient Re-evaluated prior to induction Oxygen Delivery Method: Circle system utilized Preoxygenation: Pre-oxygenation with 100% oxygen Induction Type: IV induction Ventilation: Mask ventilation without difficulty LMA: LMA inserted LMA Size: 3.0 Tube type: Oral Number of attempts: 1 Placement Confirmation: positive ETCO2 and breath sounds checked- equal and bilateral Tube secured with: Tape Dental Injury: Teeth and Oropharynx as per pre-operative assessment

## 2022-06-03 NOTE — Interval H&P Note (Signed)
History and Physical Interval Note:  06/03/2022 7:07 AM  Todd Parrish  has presented today for surgery, with the diagnosis of Osteomyelitis Right Tibial Plateau.  The various methods of treatment have been discussed with the patient and family. After consideration of risks, benefits and other options for treatment, the patient has consented to  Procedure(s): RIGHT TIBIA DEBRIDEMENT AND SKIN GRAFT (Right) as a surgical intervention.  The patient's history has been reviewed, patient examined, no change in status, stable for surgery.  I have reviewed the patient's chart and labs.  Questions were answered to the patient's satisfaction.     Nadara Mustard

## 2022-06-03 NOTE — Progress Notes (Signed)
OT Cancellation Note  Patient Details Name: Todd Parrish MRN: [id number] DOB: 12/22/1980   Cancelled Treatment:    Reason Eval/Treat Not Completed: Patient at procedure or test/ unavailable (OR with Dr Lajoyce Corners)  Mateo Flow 06/03/2022, 12:07 PM

## 2022-06-04 ENCOUNTER — Encounter (HOSPITAL_COMMUNITY): Payer: Self-pay | Admitting: Orthopedic Surgery

## 2022-06-04 DIAGNOSIS — L03115 Cellulitis of right lower limb: Secondary | ICD-10-CM | POA: Diagnosis not present

## 2022-06-04 DIAGNOSIS — L02415 Cutaneous abscess of right lower limb: Secondary | ICD-10-CM | POA: Diagnosis not present

## 2022-06-04 DIAGNOSIS — B20 Human immunodeficiency virus [HIV] disease: Secondary | ICD-10-CM | POA: Diagnosis not present

## 2022-06-04 DIAGNOSIS — I10 Essential (primary) hypertension: Secondary | ICD-10-CM | POA: Diagnosis not present

## 2022-06-04 DIAGNOSIS — S81801A Unspecified open wound, right lower leg, initial encounter: Secondary | ICD-10-CM | POA: Diagnosis not present

## 2022-06-04 DIAGNOSIS — Z6841 Body Mass Index (BMI) 40.0 and over, adult: Secondary | ICD-10-CM | POA: Diagnosis not present

## 2022-06-04 DIAGNOSIS — D638 Anemia in other chronic diseases classified elsewhere: Secondary | ICD-10-CM | POA: Diagnosis not present

## 2022-06-04 LAB — BASIC METABOLIC PANEL
Anion gap: 12 (ref 5–15)
BUN: 14 mg/dL (ref 6–20)
CO2: 26 mmol/L (ref 22–32)
Calcium: 8.6 mg/dL — ABNORMAL LOW (ref 8.9–10.3)
Chloride: 96 mmol/L — ABNORMAL LOW (ref 98–111)
Creatinine, Ser: 0.82 mg/dL (ref 0.61–1.24)
GFR, Estimated: >60 mL/min (ref >60)
Glucose, Bld: 152 mg/dL — ABNORMAL HIGH (ref 70–99)
Potassium: 4.7 mmol/L (ref 3.5–5.1)
Sodium: 134 mmol/L — ABNORMAL LOW (ref 135–145)

## 2022-06-04 LAB — CBC
HCT: 25.8 % — ABNORMAL LOW (ref 39.0–52.0)
Hemoglobin: 7.8 g/dL — ABNORMAL LOW (ref 13.0–17.0)
MCH: 24.5 pg — ABNORMAL LOW (ref 26.0–34.0)
MCHC: 30.2 g/dL (ref 30.0–36.0)
MCV: 81.1 fL (ref 80.0–100.0)
Platelets: 493 10*3/uL — ABNORMAL HIGH (ref 150–400)
RBC: 3.18 MIL/uL — ABNORMAL LOW (ref 4.22–5.81)
RDW: 17.4 % — ABNORMAL HIGH (ref 11.5–15.5)
WBC: 21.5 10*3/uL — ABNORMAL HIGH (ref 4.0–10.5)
nRBC: 0.1 % (ref 0.0–0.2)

## 2022-06-04 MED ORDER — DAPTOMYCIN IV (FOR PTA / DISCHARGE USE ONLY): 800.0000 mg | INTRAVENOUS | 0 refills | Status: DC
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
## 2022-06-04 NOTE — Progress Notes (Signed)
Inpatient Rehab Admissions Coordinator:   Following for post-op therapy recs.    Estill Dooms, PT, DPT Admissions Coordinator [phone] 06/04/22  10:04 AM

## 2022-06-04 NOTE — Plan of Care (Signed)
  Problem: Health Behavior/Discharge Planning: Goal: Ability to manage health-related needs will improve Outcome: Progressing   Problem: Clinical Measurements: Goal: Diagnostic test results will improve Outcome: Progressing   Problem: Activity: Goal: Risk for activity intolerance will decrease Outcome: Progressing   Problem: Pain Managment: Goal: General experience of comfort will improve Outcome: Progressing   

## 2022-06-04 NOTE — Progress Notes (Signed)
Patient ID: Todd Parrish, male   DOB: 1981-10-19, 41 y.o.   MRN: [id number] Patient is status post debridement of proximal tibial plateau infection status post hardware removal.  Patient underwent wound closure yesterday with placement of vancomycin powder along the bone and Kerecis tissue graft along the wound edges.  There is 50 cc in the wound VAC canister with a good suction fit.  Patient may discharge from orthopedic standpoint when he is safe with therapy.  Patient to discharge with the Praveena plus portable wound VAC pump.

## 2022-06-04 NOTE — TOC Progression Note (Signed)
Transition of Care [facility]) - Progression Note    Patient Details  Name: Todd Parrish MRN: [id number] Date of Birth: 16-Mar-1981  Transition of Care [facility]) CM/SW Contact  Graves-Bigelow, Lamar Laundry, RN Phone Number: 06/04/2022, 4:20 PM  Clinical Narrative: Case Manager confirmed with the patient that he wants to go home instead of CIR. Patient reports that he has family support and he will have family that can be educated regarding IV antibiotics as well. [facility] will supply the IV antibiotics and the OPAT order has been signed. [facility] will be on standby for [employer]. Patient will need [facility] RN orders. Case Manager has reached out to physician to see when patient will be medically stable to transition home. Patient reports that he will have transportation home once stable.   Expected Discharge Plan: Home w Home Health Services Barriers to Discharge: No Barriers Identified  Expected Discharge Plan and Services Expected Discharge Plan: Home w Home Health Services In-house Referral: Clinical Social Work Discharge Planning Services: CM Consult Post Acute Care Choice: Home Health   HH Arranged: RN, IV Antibiotics HH Agency:  ([facility])      Readmission Risk Interventions     No data to display

## 2022-06-04 NOTE — Anesthesia Postprocedure Evaluation (Signed)
Anesthesia Post Note  Patient: Education officer, museum  Procedure(s) Performed: RIGHT TIBIA DEBRIDEMENT AND SKIN GRAFT (Right: Leg Lower)     Patient location during evaluation: PACU Anesthesia Type: General Level of consciousness: awake and alert Pain management: pain level controlled Vital Signs Assessment: post-procedure vital signs reviewed and stable Respiratory status: spontaneous breathing, nonlabored ventilation, respiratory function stable and patient connected to nasal cannula oxygen Cardiovascular status: blood pressure returned to baseline and stable Postop Assessment: no apparent nausea or vomiting Anesthetic complications: no   No notable events documented.  Last Vitals:  Vitals:   06/04/22 0340 06/04/22 0805  BP: 136/69 122/69  Pulse: 81 64  Resp: 19 18  Temp: 36.9 C 36.6 C  SpO2: 95% 95%    Last Pain:  Vitals:   06/04/22 1935  TempSrc:   PainSc: 0-No pain                 Earl Lites P Enola Siebers

## 2022-06-04 NOTE — Progress Notes (Signed)
Occupational Therapy Treatment Patient Details Name: Todd Parrish MRN: [id number] DOB: 1981-10-14 Today's Date: 06/04/2022   History of present illness 41 year old male transferred from [facility] for I&D and hardware removal R tibia on 05/26/22. RLE debridement on 6/9 and 6/14 with closure of wound and replacement of wound vac. PMH: HIV, morbid obesity, ORIF of the right bicondylar plateau in 2016, chronic lymphedema of RLE presented with right lower extremity cellulitis.   OT comments  Pt moves slowly, but demonstrates ability to perform bed mobility with supervision, use of footboard and gait belt as R LE lifter. Pt stands with min assist and RW and transfers with min guard assist. Pt completes grooming with set up, UB bathing and dressing with min assist and pericare in standing with min guard assist. He is dependent in LB dressing. Progressing steadily. Remains appropriate for inpatient rehab.    Recommendations for follow up therapy are one component of a multi-disciplinary discharge planning process, led by the attending physician.  Recommendations may be updated based on patient status, additional functional criteria and insurance authorization.    Follow Up Recommendations  Acute inpatient rehab (3hours/day)    Assistance Recommended at Discharge Intermittent Supervision/Assistance  Patient can return home with the following  A little help with walking and/or transfers;A lot of help with bathing/dressing/bathroom;Assistance with cooking/housework;Assist for transportation;Help with stairs or ramp for entrance   Equipment Recommendations  Wheelchair (measurements OT);Wheelchair cushion (measurements OT);Hospital bed;Other (comment);BSC/3in1 (bariatric DME)    Recommendations for Other Services      Precautions / Restrictions Precautions Precautions: Fall Precaution Comments: wound vac R LE Restrictions Weight Bearing Restrictions: Yes RLE Weight Bearing: Weight bearing as  tolerated       Mobility Bed Mobility Overal bed mobility: Needs Assistance Bed Mobility: Supine to Sit     Supine to sit: Supervision     General bed mobility comments: use of gait belt to assist R LE OOB, used foot board    Transfers Overall transfer level: Needs assistance Equipment used: Rolling walker (2 wheels) Transfers: Sit to/from Stand Sit to Stand: Min assist           General transfer comment: cues for hand placement, use of momentum     Balance Overall balance assessment: Needs assistance Sitting-balance support: Feet supported Sitting balance-Leahy Scale: Good     Standing balance support: No upper extremity supported, During functional activity Standing balance-Leahy Scale: Fair Standing balance comment: able to release walker in static standing to perform pericare, reliant on RW for transfers                           ADL either performed or assessed with clinical judgement   ADL Overall ADL's : Needs assistance/impaired     Grooming: Wash/dry hands;Wash/dry face;Sitting;Set up   Upper Body Bathing: Minimal assistance;Sitting Upper Body Bathing Details (indicate cue type and reason): washed pt's back     Upper Body Dressing : Set up;Sitting   Lower Body Dressing: Total assistance;Bed level Lower Body Dressing Details (indicate cue type and reason): to don socks Toilet Transfer: Min guard;Stand-pivot;Rolling walker (2 wheels)   Toileting- Clothing Manipulation and Hygiene: Min guard;Sit to/from stand              Extremity/Trunk Assessment              Vision       Perception     Praxis      Cognition Arousal/Alertness: Awake/alert Behavior  During Therapy: WFL for tasks assessed/performed, Anxious Overall Cognitive Status: Within Functional Limits for tasks assessed                                 General Comments: slow        Exercises      Shoulder Instructions       General  Comments      Pertinent Vitals/ Pain       Pain Assessment Pain Assessment: Faces Faces Pain Scale: Hurts a little bit Pain Location: R LE Pain Descriptors / Indicators: Discomfort Pain Intervention(s): Monitored during session  Home Living                                          Prior Functioning/Environment              Frequency  Min 2X/week        Progress Toward Goals  OT Goals(current goals can now be found in the care plan section)  Progress towards OT goals: Progressing toward goals  Acute Rehab OT Goals OT Goal Formulation: With patient Time For Goal Achievement: 06/14/22 Potential to Achieve Goals: Good  Plan Discharge plan remains appropriate    Co-evaluation                 AM-PAC OT "6 Clicks" Daily Activity     Outcome Measure   Help from another person eating meals?: None Help from another person taking care of personal grooming?: A Little Help from another person toileting, which includes using toliet, bedpan, or urinal?: A Little Help from another person bathing (including washing, rinsing, drying)?: A Lot Help from another person to put on and taking off regular upper body clothing?: A Little Help from another person to put on and taking off regular lower body clothing?: Total 6 Click Score: 16    End of Session Equipment Utilized During Treatment: Rolling walker (2 wheels)  OT Visit Diagnosis: Unsteadiness on feet (R26.81);Muscle weakness (generalized) (M62.81)   Activity Tolerance Patient tolerated treatment well   Patient Left in chair;with call bell/phone within reach;with chair alarm set   Nurse Communication          Time: [phone] OT Time Calculation (min): 34 min  Charges: OT General Charges $OT Visit: 1 Visit OT Treatments $Self Care/Home Management : 8-22 mins  Berna Spare, OTR/L Acute Rehabilitation Services Office: [phone]  Evern Bio 06/04/2022, 1:04 PM

## 2022-06-05 ENCOUNTER — Other Ambulatory Visit (HOSPITAL_COMMUNITY): Payer: Self-pay

## 2022-06-05 DIAGNOSIS — I10 Essential (primary) hypertension: Secondary | ICD-10-CM | POA: Diagnosis not present

## 2022-06-05 DIAGNOSIS — L02415 Cutaneous abscess of right lower limb: Secondary | ICD-10-CM | POA: Diagnosis not present

## 2022-06-05 DIAGNOSIS — R609 Edema, unspecified: Secondary | ICD-10-CM | POA: Diagnosis not present

## 2022-06-05 DIAGNOSIS — S81801A Unspecified open wound, right lower leg, initial encounter: Secondary | ICD-10-CM | POA: Diagnosis not present

## 2022-06-05 DIAGNOSIS — D638 Anemia in other chronic diseases classified elsewhere: Secondary | ICD-10-CM | POA: Diagnosis not present

## 2022-06-05 DIAGNOSIS — Z6841 Body Mass Index (BMI) 40.0 and over, adult: Secondary | ICD-10-CM | POA: Diagnosis not present

## 2022-06-05 DIAGNOSIS — B20 Human immunodeficiency virus [HIV] disease: Secondary | ICD-10-CM | POA: Diagnosis not present

## 2022-06-05 DIAGNOSIS — L03115 Cellulitis of right lower limb: Secondary | ICD-10-CM | POA: Diagnosis not present

## 2022-06-05 LAB — BASIC METABOLIC PANEL
Anion gap: 7 (ref 5–15)
BUN: 16 mg/dL (ref 6–20)
CO2: 25 mmol/L (ref 22–32)
Calcium: 8 mg/dL — ABNORMAL LOW (ref 8.9–10.3)
Chloride: 101 mmol/L (ref 98–111)
Creatinine, Ser: 0.76 mg/dL (ref 0.61–1.24)
GFR, Estimated: >60 mL/min (ref >60)
Glucose, Bld: 107 mg/dL — ABNORMAL HIGH (ref 70–99)
Potassium: 4.4 mmol/L (ref 3.5–5.1)
Sodium: 133 mmol/L — ABNORMAL LOW (ref 135–145)

## 2022-06-05 LAB — CBC
HCT: 24.8 % — ABNORMAL LOW (ref 39.0–52.0)
Hemoglobin: 7.5 g/dL — ABNORMAL LOW (ref 13.0–17.0)
MCH: 24.8 pg — ABNORMAL LOW (ref 26.0–34.0)
MCHC: 30.2 g/dL (ref 30.0–36.0)
MCV: 81.8 fL (ref 80.0–100.0)
Platelets: 398 10*3/uL (ref 150–400)
RBC: 3.03 MIL/uL — ABNORMAL LOW (ref 4.22–5.81)
RDW: 18.2 % — ABNORMAL HIGH (ref 11.5–15.5)
WBC: 17.6 10*3/uL — ABNORMAL HIGH (ref 4.0–10.5)
nRBC: 0.2 % (ref 0.0–0.2)

## 2022-06-05 MED ORDER — CARVEDILOL 3.125 MG PO TABS
3.1250 mg | ORAL_TABLET | Freq: Two times a day (BID) | ORAL | 2 refills | Status: AC
Start: 2022-06-05 — End: ?
  Filled 2022-06-05: qty 60, 30d supply, fill #0

## 2022-06-05 MED ORDER — ENSURE ENLIVE PO LIQD
237.0000 mL | Freq: Two times a day (BID) | ORAL | 0 refills | Status: AC
  Filled 2022-06-05: qty 14220, 30d supply, fill #0

## 2022-06-05 MED ORDER — METRONIDAZOLE 500 MG PO TABS
500.0000 mg | ORAL_TABLET | Freq: Two times a day (BID) | ORAL | 0 refills | Status: AC
  Filled 2022-06-05: qty 60, 30d supply, fill #0

## 2022-06-05 MED ORDER — ACETAMINOPHEN 500 MG PO TABS: 500.0000 mg | ORAL_TABLET | Freq: Four times a day (QID) | ORAL | 0 refills | Status: AC | PRN

## 2022-06-05 MED ORDER — FOLIC ACID 1 MG PO TABS
1.0000 mg | ORAL_TABLET | Freq: Every day | ORAL | 0 refills | Status: DC
  Filled 2022-06-05: qty 90, 90d supply, fill #0

## 2022-06-05 MED ORDER — DOCUSATE SODIUM 100 MG PO CAPS
100.0000 mg | ORAL_CAPSULE | Freq: Two times a day (BID) | ORAL | 0 refills | Status: DC | PRN
Start: 2022-06-05 — End: 2024-10-12
  Filled 2022-06-05: qty 60, 30d supply, fill #0

## 2022-06-05 MED ORDER — OXYCODONE HCL 5 MG PO TABS
5.0000 mg | ORAL_TABLET | ORAL | 0 refills | Status: DC | PRN
  Filled 2022-06-05: qty 20, 4d supply, fill #0

## 2022-06-05 MED ORDER — FERROUS SULFATE 325 (65 FE) MG PO TABS
325.0000 mg | ORAL_TABLET | Freq: Two times a day (BID) | ORAL | 0 refills | Status: DC
  Filled 2022-06-05: qty 180, 90d supply, fill #0

## 2022-06-05 MED ORDER — METHOCARBAMOL 500 MG PO TABS
500.0000 mg | ORAL_TABLET | Freq: Four times a day (QID) | ORAL | 0 refills | Status: AC | PRN
  Filled 2022-06-05: qty 20, 5d supply, fill #0

## 2022-06-05 NOTE — TOC Transition Note (Addendum)
Transition of Care [facility]) - CM/SW Discharge Note   Patient Details  Name: [name] MRN: [id number] Date of Birth: [birth year]  Transition of Care [facility]) CM/SW Contact:  Gala Lewandowsky, RN Phone Number: 06/05/2022, 10:41 AM   Clinical Narrative: Case Manager asked if the antibiotic can be moved up to be given to patient prior to transition home. [facility] will see the patient on Saturday for connection and administration. [facility] with [facility] is working on antibiotics for home delivery @ [phone]. No further needs identified at this time. Patient stated on yesterday that he has transportation home. No further needs from Case Manager at this time.   Hospital bed ordered and Rolling Walker to be delivered to the room via [employer]. Hospital bed to be delivered on Saturday to the home. No further needs from Case Manager at this time.   Final next level of care: Home w Home Health Services Barriers to Discharge: No Barriers Identified   Patient Goals and CMS Choice Patient states their goals for this hospitalization and ongoing recovery are:: return home      Discharge Plan and Services In-house Referral: Clinical Social Work Discharge Planning Services: CM Consult Post Acute Care Choice: Home Health                    HH Arranged: RN, IV Antibiotics HH Agency:  ([facility])    Readmission Risk Interventions     No data to display
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
## 2022-06-05 NOTE — Progress Notes (Signed)
Physical Therapy Treatment Patient Details Name: Todd Parrish MRN: [id number] DOB: 08-01-1981 Today's Date: 06/05/2022   History of Present Illness 41 year old male transferred from [facility] for I&D and hardware removal R tibia on 05/26/22. RLE debridement on 6/9 and 6/14 with closure of wound and replacement of wound vac. PMH: HIV, morbid obesity, ORIF of the right bicondylar plateau in 2016, chronic lymphedema of RLE presented with right lower extremity cellulitis.    PT Comments    Pt was seen for transition from bed to sit, and sit to stand and requires a lot of time in preparation.  Pt is demonstrating independence to move RLE as well as maintain balance in sitting and standing.  Pt is aware of the goals for PT as he is expecting to go home today.  Talked with him due to having originally agreed to go to inpatient therapy, but now is returning to live with his children.  Follow up as stay permits to continue on with standing and gait with balance and stability.  Follow acute PT goals as are outlined below.  Recommendations for follow up therapy are one component of a multi-disciplinary discharge planning process, led by the attending physician.  Recommendations may be updated based on patient status, additional functional criteria and insurance authorization.  Follow Up Recommendations  Acute inpatient rehab (3hours/day)     Assistance Recommended at Discharge Frequent or constant Supervision/Assistance  Patient can return home with the following A lot of help with walking and/or transfers;A little help with bathing/dressing/bathroom;Assistance with cooking/housework;Assist for transportation;Help with stairs or ramp for entrance   Equipment Recommendations  None recommended by PT    Recommendations for Other Services Rehab consult     Precautions / Restrictions Precautions Precautions: Fall Precaution Comments: wound vac R LE Restrictions Weight Bearing Restrictions: Yes RLE  Weight Bearing: Weight bearing as tolerated     Mobility  Bed Mobility Overal bed mobility: Needs Assistance Bed Mobility: Supine to Sit, Sit to Supine     Supine to sit: Supervision Sit to supine: Supervision   General bed mobility comments: pt used a gait belt to lift RLE on and off bed    Transfers Overall transfer level: Needs assistance Equipment used: Rolling walker (2 wheels) Transfers: Sit to/from Stand Sit to Stand: Min assist           General transfer comment: cued hand placement    Ambulation/Gait               General Gait Details: declines to try to take steps   Stairs             Wheelchair Mobility    Modified Rankin (Stroke Patients Only)       Balance     Sitting balance-Leahy Scale: Good       Standing balance-Leahy Scale: Fair                              Cognition Arousal/Alertness: Awake/alert Behavior During Therapy: WFL for tasks assessed/performed, Anxious Overall Cognitive Status: Within Functional Limits for tasks assessed                                 General Comments: requiring a lot of time to move        Exercises      General Comments General comments (skin integrity, edema, etc.): pt was willing  to stand but declined any steps, reporting he can and does not need to practice      Pertinent Vitals/Pain Pain Assessment Pain Assessment: Faces Faces Pain Scale: Hurts a little bit Pain Location: R LE Pain Descriptors / Indicators: Guarding Pain Intervention(s): Limited activity within patient's tolerance, Premedicated before session, Repositioned    Home Living                          Prior Function            PT Goals (current goals can now be found in the care plan section) Acute Rehab PT Goals Patient Stated Goal: Get well and go home Progress towards PT goals: Not progressing toward goals - comment    Frequency    Min 3X/week      PT Plan  Current plan remains appropriate    Co-evaluation              AM-PAC PT "6 Clicks" Mobility   Outcome Measure  Help needed turning from your back to your side while in a flat bed without using bedrails?: A Little Help needed moving from lying on your back to sitting on the side of a flat bed without using bedrails?: A Little Help needed moving to and from a bed to a chair (including a wheelchair)?: A Lot Help needed standing up from a chair using your arms (e.g., wheelchair or bedside chair)?: A Lot Help needed to walk in hospital room?: A Lot Help needed climbing 3-5 steps with a railing? : A Lot 6 Click Score: 14    End of Session Equipment Utilized During Treatment: Gait belt Activity Tolerance: Patient limited by fatigue Patient left: in bed;with call bell/phone within reach Nurse Communication: Mobility status PT Visit Diagnosis: Other abnormalities of gait and mobility (R26.89);Pain Pain - Right/Left: Right Pain - part of body: Knee     Time: 3016-0109 PT Time Calculation (min) (ACUTE ONLY): 41 min  Charges:  $Therapeutic Activity: 23-37 mins $Neuromuscular Re-education: 8-22 mins   Ivar Drape 06/05/2022, 5:26 PM  Samul Dada, PT PhD Acute Rehab Dept. Number: [facility] [id number] and [facility] [phone]

## 2022-06-05 NOTE — Progress Notes (Signed)
Inpatient Rehab Admissions Coordinator:   Note plans to d/c home.  CIR will sign off at this time.   Estill Dooms, PT, DPT Admissions Coordinator [phone] 06/05/22  11:52 AM

## 2022-06-05 NOTE — TOC CM/SW Note (Signed)
..      Durable Medical Equipment  (From admission, onward)           Start     Ordered   06/05/22 1545  For home use only DME Walker rolling  Once       Comments: bariatric  Question Answer Comment  Walker: With 5 Inch Wheels   Patient needs a walker to treat with the following condition Cellulitis   Patient needs a walker to treat with the following condition Abscess of right lower extremity      06/05/22 1546   06/05/22 1535  For home use only DME Hospital bed  Once       Question Answer Comment  Length of Need Lifetime   Patient has (list medical condition): Ambulatory dysfunction, morbid obesity, complex wound, chronic lymphedema   The above medical condition requires: Patient requires the ability to reposition immediately   Head must be elevated greater than: 30 degrees   Bed type Semi-electric      06/05/22 1535

## 2022-06-06 DIAGNOSIS — S81801A Unspecified open wound, right lower leg, initial encounter: Secondary | ICD-10-CM | POA: Diagnosis not present

## 2022-06-06 DIAGNOSIS — L03115 Cellulitis of right lower limb: Secondary | ICD-10-CM | POA: Diagnosis not present

## 2022-06-06 DIAGNOSIS — L03116 Cellulitis of left lower limb: Secondary | ICD-10-CM | POA: Diagnosis not present

## 2022-06-06 DIAGNOSIS — L02416 Cutaneous abscess of left lower limb: Secondary | ICD-10-CM | POA: Diagnosis not present

## 2022-06-07 DIAGNOSIS — L03116 Cellulitis of left lower limb: Secondary | ICD-10-CM | POA: Diagnosis not present

## 2022-06-07 DIAGNOSIS — L03115 Cellulitis of right lower limb: Secondary | ICD-10-CM | POA: Diagnosis not present

## 2022-06-07 DIAGNOSIS — S81801A Unspecified open wound, right lower leg, initial encounter: Secondary | ICD-10-CM | POA: Diagnosis not present

## 2022-06-07 DIAGNOSIS — L02416 Cutaneous abscess of left lower limb: Secondary | ICD-10-CM | POA: Diagnosis not present

## 2022-06-08 ENCOUNTER — Other Ambulatory Visit (HOSPITAL_COMMUNITY): Payer: Self-pay

## 2022-06-08 DIAGNOSIS — L03116 Cellulitis of left lower limb: Secondary | ICD-10-CM | POA: Diagnosis not present

## 2022-06-08 DIAGNOSIS — L02416 Cutaneous abscess of left lower limb: Secondary | ICD-10-CM | POA: Diagnosis not present

## 2022-06-08 DIAGNOSIS — L03115 Cellulitis of right lower limb: Secondary | ICD-10-CM | POA: Diagnosis not present

## 2022-06-08 DIAGNOSIS — S81801A Unspecified open wound, right lower leg, initial encounter: Secondary | ICD-10-CM | POA: Diagnosis not present

## 2022-06-09 ENCOUNTER — Encounter: Payer: Self-pay | Admitting: Orthopedic Surgery

## 2022-06-09 ENCOUNTER — Telehealth: Payer: Self-pay | Admitting: *Deleted

## 2022-06-09 ENCOUNTER — Ambulatory Visit (INDEPENDENT_AMBULATORY_CARE_PROVIDER_SITE_OTHER): Payer: Medicaid Other | Admitting: Orthopedic Surgery

## 2022-06-09 DIAGNOSIS — S81801A Unspecified open wound, right lower leg, initial encounter: Secondary | ICD-10-CM | POA: Diagnosis not present

## 2022-06-09 DIAGNOSIS — M86262 Subacute osteomyelitis, left tibia and fibula: Secondary | ICD-10-CM

## 2022-06-09 DIAGNOSIS — L97921 Non-pressure chronic ulcer of unspecified part of left lower leg limited to breakdown of skin: Secondary | ICD-10-CM

## 2022-06-09 DIAGNOSIS — L02416 Cutaneous abscess of left lower limb: Secondary | ICD-10-CM | POA: Diagnosis not present

## 2022-06-09 DIAGNOSIS — L03116 Cellulitis of left lower limb: Secondary | ICD-10-CM | POA: Diagnosis not present

## 2022-06-09 DIAGNOSIS — I890 Lymphedema, not elsewhere classified: Secondary | ICD-10-CM

## 2022-06-09 DIAGNOSIS — L03115 Cellulitis of right lower limb: Secondary | ICD-10-CM | POA: Diagnosis not present
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
## 2022-06-09 NOTE — Patient Outreach (Signed)
Care Coordination  06/09/2022  Haji Delaine Steinkamp September 29, 1981 [id number]  Transition Care Management Unsuccessful Follow-up Telephone Call  Date of discharge and from where:  06/05/22,  [facility]  Attempts:  1st Attempt  Reason for unsuccessful TCM follow-up call:  Left voice message   Estanislado Emms RN, BSN [place]  [facility] RN Care Coordinator

## 2022-06-10 ENCOUNTER — Telehealth: Payer: Self-pay | Admitting: *Deleted

## 2022-06-10 DIAGNOSIS — L03115 Cellulitis of right lower limb: Secondary | ICD-10-CM | POA: Diagnosis not present

## 2022-06-10 DIAGNOSIS — L02416 Cutaneous abscess of left lower limb: Secondary | ICD-10-CM | POA: Diagnosis not present

## 2022-06-10 DIAGNOSIS — L03116 Cellulitis of left lower limb: Secondary | ICD-10-CM | POA: Diagnosis not present

## 2022-06-10 DIAGNOSIS — S81801A Unspecified open wound, right lower leg, initial encounter: Secondary | ICD-10-CM | POA: Diagnosis not present

## 2022-06-10 NOTE — Patient Outreach (Signed)
Care Coordination  06/10/2022  Valentin Benney Jaye Nov 16, 1981 [id number]  Transition Care Management Unsuccessful Follow-up Telephone Call  Date of discharge and from where:  06/05/22, [facility]  Attempts:  2nd Attempt  Reason for unsuccessful TCM follow-up call:  Left voice message   Estanislado Emms RN, BSN [place]  [facility] RN Care Coordinator

## 2022-06-11 DIAGNOSIS — L02416 Cutaneous abscess of left lower limb: Secondary | ICD-10-CM | POA: Diagnosis not present

## 2022-06-11 DIAGNOSIS — S81801A Unspecified open wound, right lower leg, initial encounter: Secondary | ICD-10-CM | POA: Diagnosis not present

## 2022-06-11 DIAGNOSIS — L03115 Cellulitis of right lower limb: Secondary | ICD-10-CM | POA: Diagnosis not present

## 2022-06-11 DIAGNOSIS — L03116 Cellulitis of left lower limb: Secondary | ICD-10-CM | POA: Diagnosis not present

## 2022-06-12 DIAGNOSIS — L02416 Cutaneous abscess of left lower limb: Secondary | ICD-10-CM | POA: Diagnosis not present

## 2022-06-12 DIAGNOSIS — S81801A Unspecified open wound, right lower leg, initial encounter: Secondary | ICD-10-CM | POA: Diagnosis not present

## 2022-06-12 DIAGNOSIS — L03115 Cellulitis of right lower limb: Secondary | ICD-10-CM | POA: Diagnosis not present

## 2022-06-12 DIAGNOSIS — L03116 Cellulitis of left lower limb: Secondary | ICD-10-CM | POA: Diagnosis not present

## 2022-06-13 DIAGNOSIS — L03115 Cellulitis of right lower limb: Secondary | ICD-10-CM | POA: Diagnosis not present

## 2022-06-13 DIAGNOSIS — L02416 Cutaneous abscess of left lower limb: Secondary | ICD-10-CM | POA: Diagnosis not present

## 2022-06-13 DIAGNOSIS — L03116 Cellulitis of left lower limb: Secondary | ICD-10-CM | POA: Diagnosis not present

## 2022-06-13 DIAGNOSIS — S81801A Unspecified open wound, right lower leg, initial encounter: Secondary | ICD-10-CM | POA: Diagnosis not present

## 2022-06-14 DIAGNOSIS — S81801A Unspecified open wound, right lower leg, initial encounter: Secondary | ICD-10-CM | POA: Diagnosis not present

## 2022-06-14 DIAGNOSIS — L03115 Cellulitis of right lower limb: Secondary | ICD-10-CM | POA: Diagnosis not present

## 2022-06-14 DIAGNOSIS — L02416 Cutaneous abscess of left lower limb: Secondary | ICD-10-CM | POA: Diagnosis not present

## 2022-06-14 DIAGNOSIS — L03116 Cellulitis of left lower limb: Secondary | ICD-10-CM | POA: Diagnosis not present

## 2022-06-15 DIAGNOSIS — M869 Osteomyelitis, unspecified: Secondary | ICD-10-CM | POA: Diagnosis not present

## 2022-06-15 DIAGNOSIS — S81801A Unspecified open wound, right lower leg, initial encounter: Secondary | ICD-10-CM | POA: Diagnosis not present

## 2022-06-15 DIAGNOSIS — L02416 Cutaneous abscess of left lower limb: Secondary | ICD-10-CM | POA: Diagnosis not present

## 2022-06-15 DIAGNOSIS — L03116 Cellulitis of left lower limb: Secondary | ICD-10-CM | POA: Diagnosis not present

## 2022-06-15 DIAGNOSIS — L03115 Cellulitis of right lower limb: Secondary | ICD-10-CM | POA: Diagnosis not present

## 2022-06-16 ENCOUNTER — Ambulatory Visit (INDEPENDENT_AMBULATORY_CARE_PROVIDER_SITE_OTHER): Payer: Medicaid Other | Admitting: Orthopedic Surgery

## 2022-06-16 DIAGNOSIS — S81801A Unspecified open wound, right lower leg, initial encounter: Secondary | ICD-10-CM | POA: Diagnosis not present

## 2022-06-16 DIAGNOSIS — L03115 Cellulitis of right lower limb: Secondary | ICD-10-CM | POA: Diagnosis not present

## 2022-06-16 DIAGNOSIS — I83212 Varicose veins of right lower extremity with both ulcer of calf and inflammation: Secondary | ICD-10-CM

## 2022-06-16 DIAGNOSIS — I890 Lymphedema, not elsewhere classified: Secondary | ICD-10-CM

## 2022-06-16 DIAGNOSIS — L03116 Cellulitis of left lower limb: Secondary | ICD-10-CM | POA: Diagnosis not present

## 2022-06-16 DIAGNOSIS — L97211 Non-pressure chronic ulcer of right calf limited to breakdown of skin: Secondary | ICD-10-CM

## 2022-06-16 DIAGNOSIS — L02416 Cutaneous abscess of left lower limb: Secondary | ICD-10-CM | POA: Diagnosis not present

## 2022-06-17 DIAGNOSIS — S81801A Unspecified open wound, right lower leg, initial encounter: Secondary | ICD-10-CM | POA: Diagnosis not present

## 2022-06-17 DIAGNOSIS — L03116 Cellulitis of left lower limb: Secondary | ICD-10-CM | POA: Diagnosis not present

## 2022-06-17 DIAGNOSIS — L02416 Cutaneous abscess of left lower limb: Secondary | ICD-10-CM | POA: Diagnosis not present

## 2022-06-17 DIAGNOSIS — L03115 Cellulitis of right lower limb: Secondary | ICD-10-CM | POA: Diagnosis not present

## 2022-06-18 DIAGNOSIS — L03116 Cellulitis of left lower limb: Secondary | ICD-10-CM | POA: Diagnosis not present

## 2022-06-18 DIAGNOSIS — S81801A Unspecified open wound, right lower leg, initial encounter: Secondary | ICD-10-CM | POA: Diagnosis not present

## 2022-06-18 DIAGNOSIS — L02416 Cutaneous abscess of left lower limb: Secondary | ICD-10-CM | POA: Diagnosis not present

## 2022-06-18 DIAGNOSIS — L03115 Cellulitis of right lower limb: Secondary | ICD-10-CM | POA: Diagnosis not present

## 2022-06-19 DIAGNOSIS — L03115 Cellulitis of right lower limb: Secondary | ICD-10-CM | POA: Diagnosis not present

## 2022-06-19 DIAGNOSIS — S81801A Unspecified open wound, right lower leg, initial encounter: Secondary | ICD-10-CM | POA: Diagnosis not present

## 2022-06-19 DIAGNOSIS — L02416 Cutaneous abscess of left lower limb: Secondary | ICD-10-CM | POA: Diagnosis not present

## 2022-06-19 DIAGNOSIS — L03116 Cellulitis of left lower limb: Secondary | ICD-10-CM | POA: Diagnosis not present

## 2022-06-20 DIAGNOSIS — L03115 Cellulitis of right lower limb: Secondary | ICD-10-CM | POA: Diagnosis not present

## 2022-06-20 DIAGNOSIS — L03116 Cellulitis of left lower limb: Secondary | ICD-10-CM | POA: Diagnosis not present

## 2022-06-20 DIAGNOSIS — S81801A Unspecified open wound, right lower leg, initial encounter: Secondary | ICD-10-CM | POA: Diagnosis not present

## 2022-06-20 DIAGNOSIS — Z419 Encounter for procedure for purposes other than remedying health state, unspecified: Secondary | ICD-10-CM | POA: Diagnosis not present

## 2022-06-20 DIAGNOSIS — L02416 Cutaneous abscess of left lower limb: Secondary | ICD-10-CM | POA: Diagnosis not present

## 2022-06-21 DIAGNOSIS — S81801A Unspecified open wound, right lower leg, initial encounter: Secondary | ICD-10-CM | POA: Diagnosis not present

## 2022-06-21 DIAGNOSIS — L03116 Cellulitis of left lower limb: Secondary | ICD-10-CM | POA: Diagnosis not present

## 2022-06-21 DIAGNOSIS — L03115 Cellulitis of right lower limb: Secondary | ICD-10-CM | POA: Diagnosis not present

## 2022-06-21 DIAGNOSIS — L02416 Cutaneous abscess of left lower limb: Secondary | ICD-10-CM | POA: Diagnosis not present

## 2022-06-22 ENCOUNTER — Encounter (HOSPITAL_BASED_OUTPATIENT_CLINIC_OR_DEPARTMENT_OTHER): Payer: Self-pay | Admitting: Emergency Medicine

## 2022-06-22 ENCOUNTER — Encounter: Payer: Self-pay | Admitting: Orthopedic Surgery

## 2022-06-22 ENCOUNTER — Telehealth: Payer: Self-pay

## 2022-06-22 ENCOUNTER — Other Ambulatory Visit: Payer: Self-pay

## 2022-06-22 DIAGNOSIS — Z86718 Personal history of other venous thrombosis and embolism: Secondary | ICD-10-CM | POA: Insufficient documentation

## 2022-06-22 DIAGNOSIS — L03115 Cellulitis of right lower limb: Secondary | ICD-10-CM | POA: Diagnosis not present

## 2022-06-22 DIAGNOSIS — Y712 Prosthetic and other implants, materials and accessory cardiovascular devices associated with adverse incidents: Secondary | ICD-10-CM | POA: Diagnosis not present

## 2022-06-22 DIAGNOSIS — I10 Essential (primary) hypertension: Secondary | ICD-10-CM | POA: Insufficient documentation

## 2022-06-22 DIAGNOSIS — T82524D Displacement of infusion catheter, subsequent encounter: Secondary | ICD-10-CM | POA: Diagnosis not present

## 2022-06-22 DIAGNOSIS — A4902 Methicillin resistant Staphylococcus aureus infection, unspecified site: Secondary | ICD-10-CM | POA: Diagnosis not present

## 2022-06-22 DIAGNOSIS — B9562 Methicillin resistant Staphylococcus aureus infection as the cause of diseases classified elsewhere: Secondary | ICD-10-CM | POA: Diagnosis not present

## 2022-06-22 DIAGNOSIS — S81801A Unspecified open wound, right lower leg, initial encounter: Secondary | ICD-10-CM | POA: Diagnosis not present

## 2022-06-22 DIAGNOSIS — T82524A Displacement of infusion catheter, initial encounter: Secondary | ICD-10-CM | POA: Diagnosis not present

## 2022-06-22 DIAGNOSIS — L02416 Cutaneous abscess of left lower limb: Secondary | ICD-10-CM | POA: Diagnosis not present

## 2022-06-22 DIAGNOSIS — L03116 Cellulitis of left lower limb: Secondary | ICD-10-CM | POA: Diagnosis not present

## 2022-06-22 NOTE — Telephone Encounter (Signed)
Thank you, Erin

## 2022-06-22 NOTE — Telephone Encounter (Signed)
Spoke to staff member at [facility] [phone]), who is managing patient's PICC. Confirmed the [facility] pharmacist had received the update on patient's PICC movement.  Wyvonne Lenz, RN
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
## 2022-06-22 NOTE — Telephone Encounter (Signed)
Patient called requesting advice; stated he is currently with his [facility] nurse, and PICC line has shifted in this past week from 7 cm to 12 cm. HH nurse unable to draw blood from line. Pt states line flushed, and that he did give himself his daptomycin dose last night.  Advised patient to go to the ED for assessment of PICC.  Per OPAT on 6/10, EOT scheduled 07/11/22.  Message routed to provider and [facility] clinical pharmacists.  Wyvonne Lenz, RN

## 2022-06-22 NOTE — ED Triage Notes (Signed)
Patient arrived POV. Has PICC for osteo in leg. Was receiving ABT daptomycin. Today home health nurse unable to get blood from site and notice that PICC has migrated out approximated 12-13cm.  Pt contacted surgeon and was told to come in. Unable to get ID team on phone. States he was due for blood draws

## 2022-06-23 ENCOUNTER — Emergency Department (HOSPITAL_BASED_OUTPATIENT_CLINIC_OR_DEPARTMENT_OTHER): Payer: Medicaid Other | Admitting: Radiology

## 2022-06-23 ENCOUNTER — Emergency Department (HOSPITAL_BASED_OUTPATIENT_CLINIC_OR_DEPARTMENT_OTHER)
Admission: EM | Admit: 2022-06-23 | Discharge: 2022-06-23 | Disposition: A | Discharge status: Home / Self Care | Payer: Medicaid Other | Attending: Emergency Medicine | Admitting: Emergency Medicine

## 2022-06-23 ENCOUNTER — Telehealth: Payer: Self-pay | Admitting: Internal Medicine

## 2022-06-23 DIAGNOSIS — L02416 Cutaneous abscess of left lower limb: Secondary | ICD-10-CM | POA: Diagnosis not present

## 2022-06-23 DIAGNOSIS — A4902 Methicillin resistant Staphylococcus aureus infection, unspecified site: Secondary | ICD-10-CM

## 2022-06-23 DIAGNOSIS — T82524A Displacement of infusion catheter, initial encounter: Secondary | ICD-10-CM

## 2022-06-23 DIAGNOSIS — S81801A Unspecified open wound, right lower leg, initial encounter: Secondary | ICD-10-CM | POA: Diagnosis not present

## 2022-06-23 DIAGNOSIS — L03115 Cellulitis of right lower limb: Secondary | ICD-10-CM | POA: Diagnosis not present

## 2022-06-23 DIAGNOSIS — L03116 Cellulitis of left lower limb: Secondary | ICD-10-CM | POA: Diagnosis not present

## 2022-06-23 MED ORDER — DOXYCYCLINE HYCLATE 100 MG PO CAPS: 100.0000 mg | ORAL_CAPSULE | Freq: Two times a day (BID) | ORAL | 0 refills | Status: AC

## 2022-06-23 MED ORDER — DOXYCYCLINE HYCLATE 100 MG PO TABS
100.0000 mg | ORAL_TABLET | Freq: Once | ORAL | Status: AC
  Administered 2022-06-23: 100 mg via ORAL
  Filled 2022-06-23: qty 1

## 2022-06-23 NOTE — ED Notes (Signed)
Pt verbalizes understanding of discharge instructions. Opportunity for questioning and answers were provided. Pt discharged from ED to home.   ? ?

## 2022-06-23 NOTE — ED Notes (Addendum)
Only able to get one blue top blood culture with 40mL's in bottle. MD aware and requested for bottle to be sent to lab for analysis despite inadequate volume. 2 phlebotomy attempts. Pt refused any more attempts.
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
## 2022-06-23 NOTE — ED Notes (Signed)
Pt here with complaint of picc line displacement. No further symptoms or complaints.

## 2022-06-24 ENCOUNTER — Telehealth: Payer: Self-pay

## 2022-06-24 DIAGNOSIS — L03115 Cellulitis of right lower limb: Secondary | ICD-10-CM | POA: Diagnosis not present

## 2022-06-24 DIAGNOSIS — S81801A Unspecified open wound, right lower leg, initial encounter: Secondary | ICD-10-CM | POA: Diagnosis not present

## 2022-06-24 DIAGNOSIS — L03116 Cellulitis of left lower limb: Secondary | ICD-10-CM | POA: Diagnosis not present

## 2022-06-24 DIAGNOSIS — L02416 Cutaneous abscess of left lower limb: Secondary | ICD-10-CM | POA: Diagnosis not present

## 2022-06-24 NOTE — Patient Outreach (Signed)
Care Coordination  06/24/2022  Todd Parrish Nov 28, 1981 [id number]  Transition Care Management Unsuccessful Follow-up Telephone Call  Date of discharge and from where:  06/23/22  Attempts:  1st Attempt  Reason for unsuccessful TCM follow-up call:  Left voice message

## 2022-06-24 NOTE — Telephone Encounter (Signed)
LVM with patient to follow-up on medication changes. Cannot see he filled his doxycycline from our dispense report. Will await returned call. Marchelle Folks

## 2022-06-25 ENCOUNTER — Other Ambulatory Visit (HOSPITAL_COMMUNITY): Payer: Self-pay

## 2022-06-25 ENCOUNTER — Ambulatory Visit (INDEPENDENT_AMBULATORY_CARE_PROVIDER_SITE_OTHER): Payer: Medicaid Other | Admitting: Orthopedic Surgery

## 2022-06-25 ENCOUNTER — Telehealth: Payer: Self-pay

## 2022-06-25 DIAGNOSIS — I890 Lymphedema, not elsewhere classified: Secondary | ICD-10-CM

## 2022-06-25 DIAGNOSIS — L97211 Non-pressure chronic ulcer of right calf limited to breakdown of skin: Secondary | ICD-10-CM

## 2022-06-25 DIAGNOSIS — I83212 Varicose veins of right lower extremity with both ulcer of calf and inflammation: Secondary | ICD-10-CM

## 2022-06-25 DIAGNOSIS — L03116 Cellulitis of left lower limb: Secondary | ICD-10-CM | POA: Diagnosis not present

## 2022-06-25 DIAGNOSIS — L03115 Cellulitis of right lower limb: Secondary | ICD-10-CM | POA: Diagnosis not present

## 2022-06-25 DIAGNOSIS — L02416 Cutaneous abscess of left lower limb: Secondary | ICD-10-CM | POA: Diagnosis not present

## 2022-06-25 DIAGNOSIS — S81801A Unspecified open wound, right lower leg, initial encounter: Secondary | ICD-10-CM | POA: Diagnosis not present

## 2022-06-25 NOTE — Patient Outreach (Signed)
Care Coordination  06/25/2022  Todd Parrish [birth year] [id number]  Transition Care Management Unsuccessful Follow-up Telephone Call  Date of discharge and from where:  06/23/22 [facility]  Attempts:  2nd Attempt  Reason for unsuccessful TCM follow-up call:  Left voice message

## 2022-06-26 ENCOUNTER — Telehealth: Payer: Self-pay

## 2022-06-26 DIAGNOSIS — L02416 Cutaneous abscess of left lower limb: Secondary | ICD-10-CM | POA: Diagnosis not present

## 2022-06-26 DIAGNOSIS — L03115 Cellulitis of right lower limb: Secondary | ICD-10-CM | POA: Diagnosis not present

## 2022-06-26 DIAGNOSIS — S81801A Unspecified open wound, right lower leg, initial encounter: Secondary | ICD-10-CM | POA: Diagnosis not present

## 2022-06-26 DIAGNOSIS — L03116 Cellulitis of left lower limb: Secondary | ICD-10-CM | POA: Diagnosis not present

## 2022-06-26 NOTE — Patient Outreach (Signed)
Care Coordination  06/26/2022  Todd Parrish 06-10-1981 [id number]  Transition Care Management Unsuccessful Follow-up Telephone Call  Date of discharge and from where:  06/23/22 [facility]  Attempts:  3rd Attempt  Reason for unsuccessful TCM follow-up call:  Left voice message

## 2022-06-28 LAB — CULTURE, BLOOD (ROUTINE X 2): Culture: NO GROWTH

## 2022-06-29 ENCOUNTER — Telehealth: Payer: Self-pay | Admitting: Orthopedic Surgery

## 2022-06-29 ENCOUNTER — Other Ambulatory Visit (HOSPITAL_COMMUNITY): Payer: Self-pay

## 2022-06-29 ENCOUNTER — Other Ambulatory Visit: Payer: Self-pay | Admitting: Infectious Diseases

## 2022-06-29 DIAGNOSIS — B20 Human immunodeficiency virus [HIV] disease: Secondary | ICD-10-CM

## 2022-06-29 NOTE — Telephone Encounter (Signed)
SW pt, he had a question of when he should take his wrap off. It was wrapped in ace bandage last week after taking sutures out. I let him know that he should take it off now and cleanse with dial soap daily. Pat dry and can use compression sock.

## 2022-06-29 NOTE — Telephone Encounter (Signed)
Pt called with medical questions about his wrapped leg from surgery. Please call pt at [phone].

## 2022-07-09 ENCOUNTER — Inpatient Hospital Stay: Payer: Medicaid Other | Admitting: Internal Medicine

## 2022-07-14 ENCOUNTER — Encounter: Payer: Self-pay | Admitting: Orthopedic Surgery

## 2022-07-14 ENCOUNTER — Inpatient Hospital Stay: Payer: Medicaid Other | Admitting: Internal Medicine
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
## 2022-07-16 ENCOUNTER — Inpatient Hospital Stay: Payer: Medicaid Other | Admitting: Internal Medicine

## 2022-07-17 ENCOUNTER — Inpatient Hospital Stay: Payer: Medicaid Other | Admitting: Internal Medicine

## 2022-07-21 DIAGNOSIS — Z419 Encounter for procedure for purposes other than remedying health state, unspecified: Secondary | ICD-10-CM | POA: Diagnosis not present

## 2022-07-22 ENCOUNTER — Telehealth: Payer: Self-pay | Admitting: Pharmacist

## 2022-07-22 ENCOUNTER — Other Ambulatory Visit: Payer: Self-pay | Admitting: Pharmacist

## 2022-07-22 ENCOUNTER — Other Ambulatory Visit (HOSPITAL_COMMUNITY): Payer: Self-pay

## 2022-07-22 DIAGNOSIS — B20 Human immunodeficiency virus [HIV] disease: Secondary | ICD-10-CM

## 2022-07-22 MED ORDER — BICTEGRAVIR-EMTRICITAB-TENOFOV 50-200-25 MG PO TABS
1.0000 | ORAL_TABLET | Freq: Every day | ORAL | 0 refills | Status: DC
  Filled 2022-07-22: qty 30, 30d supply, fill #0

## 2022-07-22 NOTE — Telephone Encounter (Signed)
Patient last seen for HIV care in May 2022 with Dr. Ninetta Lights. Requesting Biktarvy refill as he ran out today. Sent one month script without refills and scheduled him to see Dr. Thedore Mins next week. States he missed several appointments with providers at the end of July as his grandfather passed away.  Margarite Gouge, PharmD, CPP, BCIDP Clinical Pharmacist Practitioner Infectious Diseases Clinical Pharmacist [facility]

## 2022-07-31 ENCOUNTER — Ambulatory Visit: Payer: Medicaid Other | Admitting: Internal Medicine

## 2022-07-31 ENCOUNTER — Ambulatory Visit (INDEPENDENT_AMBULATORY_CARE_PROVIDER_SITE_OTHER): Payer: Medicaid Other | Admitting: Internal Medicine

## 2022-07-31 ENCOUNTER — Encounter: Payer: Self-pay | Admitting: Internal Medicine

## 2022-07-31 ENCOUNTER — Other Ambulatory Visit: Payer: Self-pay

## 2022-07-31 VITALS — BP 147/84 | HR 77 | Temp 97.9°F | Ht 67.0 in | Wt 375.0 lb

## 2022-07-31 DIAGNOSIS — B20 Human immunodeficiency virus [HIV] disease: Secondary | ICD-10-CM

## 2022-07-31 DIAGNOSIS — M869 Osteomyelitis, unspecified: Secondary | ICD-10-CM

## 2022-07-31 MED ORDER — BICTEGRAVIR-EMTRICITAB-TENOFOV 50-200-25 MG PO TABS
1.0000 | ORAL_TABLET | Freq: Every day | ORAL | 5 refills | Status: DC
  Filled 2022-07-31 – 2022-08-14 (×2): qty 30, 30d supply, fill #0
  Filled 2022-10-26: qty 30, 30d supply, fill #1
  Filled 2022-12-11: qty 30, 30d supply, fill #2
  Filled 2022-12-29 – 2023-01-29 (×3): qty 30, 30d supply, fill #3
  Filled 2023-02-25: qty 30, 30d supply, fill #4
  Filled 2023-03-31 – 2023-05-14 (×3): qty 30, 30d supply, fill #5
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
## 2022-08-01 LAB — GC/CHLAMYDIA PROBE, AMP (THROAT)
Chlamydia trachomatis RNA: NOT DETECTED
Neisseria gonorrhoeae RNA: NOT DETECTED

## 2022-08-03 ENCOUNTER — Other Ambulatory Visit (HOSPITAL_COMMUNITY): Payer: Self-pay

## 2022-08-09 DIAGNOSIS — R609 Edema, unspecified: Secondary | ICD-10-CM | POA: Diagnosis not present

## 2022-08-10 ENCOUNTER — Other Ambulatory Visit (HOSPITAL_COMMUNITY): Payer: Self-pay

## 2022-08-14 ENCOUNTER — Other Ambulatory Visit (HOSPITAL_COMMUNITY): Payer: Self-pay

## 2022-08-18 ENCOUNTER — Other Ambulatory Visit (HOSPITAL_COMMUNITY): Payer: Self-pay

## 2022-08-21 DIAGNOSIS — Z419 Encounter for procedure for purposes other than remedying health state, unspecified: Secondary | ICD-10-CM | POA: Diagnosis not present

## 2022-08-26 ENCOUNTER — Other Ambulatory Visit (HOSPITAL_COMMUNITY): Payer: Self-pay

## 2022-08-28 ENCOUNTER — Ambulatory Visit: Payer: Medicaid Other | Admitting: Internal Medicine

## 2022-09-09 ENCOUNTER — Other Ambulatory Visit (HOSPITAL_COMMUNITY): Payer: Self-pay

## 2022-09-09 DIAGNOSIS — R609 Edema, unspecified: Secondary | ICD-10-CM | POA: Diagnosis not present

## 2022-09-09 DIAGNOSIS — I890 Lymphedema, not elsewhere classified: Secondary | ICD-10-CM | POA: Diagnosis not present

## 2022-09-11 ENCOUNTER — Other Ambulatory Visit (HOSPITAL_COMMUNITY): Payer: Self-pay

## 2022-09-14 ENCOUNTER — Other Ambulatory Visit (HOSPITAL_COMMUNITY): Payer: Self-pay

## 2022-09-20 DIAGNOSIS — Z419 Encounter for procedure for purposes other than remedying health state, unspecified: Secondary | ICD-10-CM | POA: Diagnosis not present

## 2022-10-09 DIAGNOSIS — R609 Edema, unspecified: Secondary | ICD-10-CM | POA: Diagnosis not present

## 2022-10-12 ENCOUNTER — Ambulatory Visit: Payer: Medicaid Other | Admitting: Orthopedic Surgery

## 2022-10-21 DIAGNOSIS — Z419 Encounter for procedure for purposes other than remedying health state, unspecified: Secondary | ICD-10-CM | POA: Diagnosis not present

## 2022-10-26 ENCOUNTER — Other Ambulatory Visit (HOSPITAL_COMMUNITY): Payer: Self-pay

## 2022-11-09 DIAGNOSIS — R609 Edema, unspecified: Secondary | ICD-10-CM | POA: Diagnosis not present

## 2022-11-16 DIAGNOSIS — L03115 Cellulitis of right lower limb: Secondary | ICD-10-CM | POA: Diagnosis not present

## 2022-11-20 DIAGNOSIS — Z419 Encounter for procedure for purposes other than remedying health state, unspecified: Secondary | ICD-10-CM | POA: Diagnosis not present

## 2022-12-11 ENCOUNTER — Other Ambulatory Visit (HOSPITAL_COMMUNITY): Payer: Self-pay

## 2022-12-21 DIAGNOSIS — Z419 Encounter for procedure for purposes other than remedying health state, unspecified: Secondary | ICD-10-CM | POA: Diagnosis not present

## 2022-12-29 ENCOUNTER — Other Ambulatory Visit (HOSPITAL_COMMUNITY): Payer: Self-pay

## 2023-01-06 ENCOUNTER — Other Ambulatory Visit (HOSPITAL_COMMUNITY): Payer: Self-pay

## 2023-01-07 ENCOUNTER — Other Ambulatory Visit (HOSPITAL_COMMUNITY): Payer: Self-pay

## 2023-01-07 ENCOUNTER — Other Ambulatory Visit: Payer: Self-pay

## 2023-01-20 ENCOUNTER — Other Ambulatory Visit (HOSPITAL_COMMUNITY): Payer: Self-pay

## 2023-01-21 ENCOUNTER — Other Ambulatory Visit (HOSPITAL_COMMUNITY): Payer: Self-pay

## 2023-01-21 DIAGNOSIS — Z419 Encounter for procedure for purposes other than remedying health state, unspecified: Secondary | ICD-10-CM | POA: Diagnosis not present

## 2023-01-27 ENCOUNTER — Other Ambulatory Visit (HOSPITAL_COMMUNITY): Payer: Self-pay

## 2023-01-28 ENCOUNTER — Other Ambulatory Visit (HOSPITAL_COMMUNITY): Payer: Self-pay

## 2023-01-29 ENCOUNTER — Other Ambulatory Visit (HOSPITAL_COMMUNITY): Payer: Self-pay

## 2023-02-01 ENCOUNTER — Other Ambulatory Visit (HOSPITAL_COMMUNITY): Payer: Self-pay

## 2023-02-06 DIAGNOSIS — F331 Major depressive disorder, recurrent, moderate: Secondary | ICD-10-CM | POA: Diagnosis not present

## 2023-02-10 ENCOUNTER — Other Ambulatory Visit (HOSPITAL_COMMUNITY): Payer: Self-pay

## 2023-02-11 ENCOUNTER — Other Ambulatory Visit (HOSPITAL_COMMUNITY): Payer: Self-pay

## 2023-02-15 DIAGNOSIS — F331 Major depressive disorder, recurrent, moderate: Secondary | ICD-10-CM | POA: Diagnosis not present

## 2023-02-17 DIAGNOSIS — F331 Major depressive disorder, recurrent, moderate: Secondary | ICD-10-CM | POA: Diagnosis not present

## 2023-02-19 DIAGNOSIS — Z419 Encounter for procedure for purposes other than remedying health state, unspecified: Secondary | ICD-10-CM | POA: Diagnosis not present

## 2023-02-25 ENCOUNTER — Other Ambulatory Visit (HOSPITAL_COMMUNITY): Payer: Self-pay

## 2023-02-26 ENCOUNTER — Other Ambulatory Visit (HOSPITAL_COMMUNITY): Payer: Self-pay

## 2023-03-03 DIAGNOSIS — F411 Generalized anxiety disorder: Secondary | ICD-10-CM | POA: Diagnosis not present

## 2023-03-10 DIAGNOSIS — F411 Generalized anxiety disorder: Secondary | ICD-10-CM | POA: Diagnosis not present

## 2023-03-12 ENCOUNTER — Other Ambulatory Visit (HOSPITAL_COMMUNITY): Payer: Self-pay

## 2023-03-17 DIAGNOSIS — F411 Generalized anxiety disorder: Secondary | ICD-10-CM | POA: Diagnosis not present

## 2023-03-22 DIAGNOSIS — Z419 Encounter for procedure for purposes other than remedying health state, unspecified: Secondary | ICD-10-CM | POA: Diagnosis not present

## 2023-03-23 ENCOUNTER — Other Ambulatory Visit (HOSPITAL_COMMUNITY): Payer: Self-pay

## 2023-03-23 DIAGNOSIS — F411 Generalized anxiety disorder: Secondary | ICD-10-CM | POA: Diagnosis not present

## 2023-03-24 DIAGNOSIS — F411 Generalized anxiety disorder: Secondary | ICD-10-CM | POA: Diagnosis not present

## 2023-03-30 DIAGNOSIS — F411 Generalized anxiety disorder: Secondary | ICD-10-CM | POA: Diagnosis not present

## 2023-03-31 ENCOUNTER — Other Ambulatory Visit (HOSPITAL_COMMUNITY): Payer: Self-pay

## 2023-03-31 DIAGNOSIS — F411 Generalized anxiety disorder: Secondary | ICD-10-CM | POA: Diagnosis not present

## 2023-04-01 ENCOUNTER — Other Ambulatory Visit: Payer: Self-pay

## 2023-04-06 ENCOUNTER — Telehealth: Payer: Self-pay

## 2023-04-06 DIAGNOSIS — F411 Generalized anxiety disorder: Secondary | ICD-10-CM | POA: Diagnosis not present

## 2023-04-06 NOTE — Telephone Encounter (Signed)
Attempted to contact patient to schedule apt with PCP for chronic condition follow up. AS, CMA LVM to call back. 

## 2023-04-07 DIAGNOSIS — F411 Generalized anxiety disorder: Secondary | ICD-10-CM | POA: Diagnosis not present

## 2023-04-12 ENCOUNTER — Other Ambulatory Visit (HOSPITAL_COMMUNITY): Payer: Self-pay

## 2023-04-12 DIAGNOSIS — J340 Abscess, furuncle and carbuncle of nose: Secondary | ICD-10-CM | POA: Diagnosis not present

## 2023-04-13 ENCOUNTER — Other Ambulatory Visit (HOSPITAL_COMMUNITY): Payer: Self-pay

## 2023-04-14 DIAGNOSIS — F411 Generalized anxiety disorder: Secondary | ICD-10-CM | POA: Diagnosis not present

## 2023-04-19 ENCOUNTER — Other Ambulatory Visit (HOSPITAL_COMMUNITY): Payer: Self-pay

## 2023-04-21 DIAGNOSIS — Z419 Encounter for procedure for purposes other than remedying health state, unspecified: Secondary | ICD-10-CM | POA: Diagnosis not present

## 2023-04-22 DIAGNOSIS — F411 Generalized anxiety disorder: Secondary | ICD-10-CM | POA: Diagnosis not present

## 2023-04-24 DIAGNOSIS — F411 Generalized anxiety disorder: Secondary | ICD-10-CM | POA: Diagnosis not present

## 2023-04-29 ENCOUNTER — Other Ambulatory Visit (HOSPITAL_COMMUNITY): Payer: Self-pay

## 2023-04-29 DIAGNOSIS — F411 Generalized anxiety disorder: Secondary | ICD-10-CM | POA: Diagnosis not present

## 2023-04-30 DIAGNOSIS — F411 Generalized anxiety disorder: Secondary | ICD-10-CM | POA: Diagnosis not present

## 2023-05-03 ENCOUNTER — Other Ambulatory Visit (HOSPITAL_COMMUNITY): Payer: Self-pay

## 2023-05-04 ENCOUNTER — Other Ambulatory Visit (HOSPITAL_COMMUNITY): Payer: Self-pay

## 2023-05-04 DIAGNOSIS — F411 Generalized anxiety disorder: Secondary | ICD-10-CM | POA: Diagnosis not present

## 2023-05-07 DIAGNOSIS — F411 Generalized anxiety disorder: Secondary | ICD-10-CM | POA: Diagnosis not present

## 2023-05-12 DIAGNOSIS — F411 Generalized anxiety disorder: Secondary | ICD-10-CM | POA: Diagnosis not present

## 2023-05-13 ENCOUNTER — Other Ambulatory Visit (HOSPITAL_COMMUNITY): Payer: Self-pay

## 2023-05-13 DIAGNOSIS — F411 Generalized anxiety disorder: Secondary | ICD-10-CM | POA: Diagnosis not present

## 2023-05-14 ENCOUNTER — Other Ambulatory Visit (HOSPITAL_COMMUNITY): Payer: Self-pay

## 2023-05-21 DIAGNOSIS — F411 Generalized anxiety disorder: Secondary | ICD-10-CM | POA: Diagnosis not present

## 2023-05-22 DIAGNOSIS — Z419 Encounter for procedure for purposes other than remedying health state, unspecified: Secondary | ICD-10-CM | POA: Diagnosis not present

## 2023-05-22 DIAGNOSIS — F411 Generalized anxiety disorder: Secondary | ICD-10-CM | POA: Diagnosis not present

## 2023-05-25 DIAGNOSIS — F411 Generalized anxiety disorder: Secondary | ICD-10-CM | POA: Diagnosis not present

## 2023-06-01 DIAGNOSIS — F411 Generalized anxiety disorder: Secondary | ICD-10-CM | POA: Diagnosis not present

## 2023-06-03 DIAGNOSIS — F411 Generalized anxiety disorder: Secondary | ICD-10-CM | POA: Diagnosis not present

## 2023-06-08 ENCOUNTER — Other Ambulatory Visit (HOSPITAL_COMMUNITY): Payer: Self-pay

## 2023-06-09 DIAGNOSIS — F411 Generalized anxiety disorder: Secondary | ICD-10-CM | POA: Diagnosis not present

## 2023-06-10 ENCOUNTER — Telehealth: Payer: Self-pay

## 2023-06-10 ENCOUNTER — Other Ambulatory Visit: Payer: Self-pay | Admitting: Internal Medicine

## 2023-06-10 ENCOUNTER — Other Ambulatory Visit: Payer: Self-pay

## 2023-06-10 ENCOUNTER — Other Ambulatory Visit (HOSPITAL_COMMUNITY): Payer: Self-pay

## 2023-06-10 DIAGNOSIS — B20 Human immunodeficiency virus [HIV] disease: Secondary | ICD-10-CM

## 2023-06-10 MED ORDER — BIKTARVY 50-200-25 MG PO TABS
1.0000 | ORAL_TABLET | Freq: Every day | ORAL | 0 refills | Status: DC
  Filled 2023-06-10 – 2023-07-06 (×2): qty 30, 30d supply, fill #0

## 2023-06-10 NOTE — Telephone Encounter (Signed)
Left VM for patient to call back - Due for follow up with Dr. Thedore Mins.

## 2023-06-11 DIAGNOSIS — F411 Generalized anxiety disorder: Secondary | ICD-10-CM | POA: Diagnosis not present

## 2023-06-14 ENCOUNTER — Other Ambulatory Visit (HOSPITAL_COMMUNITY): Payer: Self-pay

## 2023-06-15 DIAGNOSIS — F411 Generalized anxiety disorder: Secondary | ICD-10-CM | POA: Diagnosis not present

## 2023-06-17 DIAGNOSIS — F411 Generalized anxiety disorder: Secondary | ICD-10-CM | POA: Diagnosis not present

## 2023-06-21 DIAGNOSIS — Z419 Encounter for procedure for purposes other than remedying health state, unspecified: Secondary | ICD-10-CM | POA: Diagnosis not present

## 2023-06-22 ENCOUNTER — Other Ambulatory Visit (HOSPITAL_COMMUNITY): Payer: Self-pay

## 2023-06-23 DIAGNOSIS — F411 Generalized anxiety disorder: Secondary | ICD-10-CM | POA: Diagnosis not present

## 2023-06-24 DIAGNOSIS — F411 Generalized anxiety disorder: Secondary | ICD-10-CM | POA: Diagnosis not present

## 2023-06-30 DIAGNOSIS — F411 Generalized anxiety disorder: Secondary | ICD-10-CM | POA: Diagnosis not present

## 2023-07-01 DIAGNOSIS — F411 Generalized anxiety disorder: Secondary | ICD-10-CM | POA: Diagnosis not present

## 2023-07-06 ENCOUNTER — Other Ambulatory Visit: Payer: Self-pay

## 2023-07-06 ENCOUNTER — Other Ambulatory Visit (HOSPITAL_COMMUNITY): Payer: Self-pay

## 2023-07-06 DIAGNOSIS — F411 Generalized anxiety disorder: Secondary | ICD-10-CM | POA: Diagnosis not present

## 2023-07-10 DIAGNOSIS — F411 Generalized anxiety disorder: Secondary | ICD-10-CM | POA: Diagnosis not present

## 2023-07-13 DIAGNOSIS — F411 Generalized anxiety disorder: Secondary | ICD-10-CM | POA: Diagnosis not present

## 2023-07-15 DIAGNOSIS — F411 Generalized anxiety disorder: Secondary | ICD-10-CM | POA: Diagnosis not present

## 2023-07-20 ENCOUNTER — Ambulatory Visit: Payer: Medicaid Other | Admitting: Internal Medicine

## 2023-07-21 ENCOUNTER — Other Ambulatory Visit: Payer: Self-pay

## 2023-07-22 DIAGNOSIS — Z419 Encounter for procedure for purposes other than remedying health state, unspecified: Secondary | ICD-10-CM | POA: Diagnosis not present

## 2023-07-23 DIAGNOSIS — F411 Generalized anxiety disorder: Secondary | ICD-10-CM | POA: Diagnosis not present

## 2023-07-26 ENCOUNTER — Other Ambulatory Visit: Payer: Self-pay

## 2023-07-26 ENCOUNTER — Other Ambulatory Visit (HOSPITAL_COMMUNITY): Payer: Self-pay

## 2023-07-26 ENCOUNTER — Other Ambulatory Visit: Payer: Self-pay | Admitting: Internal Medicine

## 2023-07-26 DIAGNOSIS — B20 Human immunodeficiency virus [HIV] disease: Secondary | ICD-10-CM

## 2023-07-26 MED ORDER — BIKTARVY 50-200-25 MG PO TABS
1.0000 | ORAL_TABLET | Freq: Every day | ORAL | 0 refills | Status: DC
  Filled 2023-07-26: qty 30, 30d supply, fill #0

## 2023-07-27 ENCOUNTER — Other Ambulatory Visit: Payer: Self-pay

## 2023-07-27 DIAGNOSIS — F411 Generalized anxiety disorder: Secondary | ICD-10-CM | POA: Diagnosis not present

## 2023-07-28 DIAGNOSIS — F411 Generalized anxiety disorder: Secondary | ICD-10-CM | POA: Diagnosis not present

## 2023-07-30 ENCOUNTER — Other Ambulatory Visit (HOSPITAL_COMMUNITY): Payer: Self-pay

## 2023-08-04 DIAGNOSIS — F411 Generalized anxiety disorder: Secondary | ICD-10-CM | POA: Diagnosis not present

## 2023-08-05 DIAGNOSIS — F411 Generalized anxiety disorder: Secondary | ICD-10-CM | POA: Diagnosis not present

## 2023-08-10 ENCOUNTER — Other Ambulatory Visit (HOSPITAL_COMMUNITY): Payer: Self-pay

## 2023-08-11 DIAGNOSIS — F411 Generalized anxiety disorder: Secondary | ICD-10-CM | POA: Diagnosis not present

## 2023-08-12 DIAGNOSIS — F411 Generalized anxiety disorder: Secondary | ICD-10-CM | POA: Diagnosis not present

## 2023-08-14 ENCOUNTER — Encounter (HOSPITAL_BASED_OUTPATIENT_CLINIC_OR_DEPARTMENT_OTHER): Payer: Self-pay | Admitting: Emergency Medicine

## 2023-08-14 ENCOUNTER — Emergency Department (HOSPITAL_BASED_OUTPATIENT_CLINIC_OR_DEPARTMENT_OTHER)
Admission: EM | Admit: 2023-08-14 | Discharge: 2023-08-14 | Disposition: A | Discharge status: Home / Self Care | Payer: Medicaid Other | Source: Home / Self Care | Attending: Emergency Medicine | Admitting: Emergency Medicine

## 2023-08-14 ENCOUNTER — Other Ambulatory Visit: Payer: Self-pay

## 2023-08-14 DIAGNOSIS — M79605 Pain in left leg: Secondary | ICD-10-CM | POA: Diagnosis not present

## 2023-08-14 DIAGNOSIS — L089 Local infection of the skin and subcutaneous tissue, unspecified: Secondary | ICD-10-CM | POA: Diagnosis not present

## 2023-08-14 DIAGNOSIS — M7989 Other specified soft tissue disorders: Secondary | ICD-10-CM | POA: Diagnosis not present

## 2023-08-14 DIAGNOSIS — M79662 Pain in left lower leg: Secondary | ICD-10-CM | POA: Diagnosis not present

## 2023-08-14 NOTE — ED Provider Notes (Signed)
[place] EMERGENCY DEPARTMENT AT [facility] Provider Note   CSN: [id number] Arrival date & time: 08/14/23  2109     History  Chief Complaint  Patient presents with   Leg Swelling    Name Todd Parrish is a 42 y.o. male.  42 year old male presents with worsening pain to his left lower extremity.  Seen at urgent care and diagnosed with cellulitis.  Has a history of DVT in the past.  He is also had PEs and has IVC filter in.  Denies any chest pain or shortness of breath.  States his pain feels like symptoms when he has a DVT.  Was sent here to have ultrasound to rule out clot      Home Medications Prior to Admission medications   Medication Sig Start Date End Date Taking? Authorizing Provider  acetaminophen (TYLENOL) 500 MG tablet Take 1 tablet (500 mg total) by mouth every 6 (six) hours as needed for headache or mild pain (pain). 06/05/22   Pokhrel, Rebekah [place], MD  bictegravir-emtricitabine-tenofovir AF (BIKTARVY) 50-200-25 MG TABS tablet Take 1 tablet by mouth daily. Must be seen for future refills. 07/26/23   Danelle Earthly, MD  carvedilol (COREG) 3.125 MG tablet Take 1 tablet (3.125 mg total) by mouth 2 (two) times daily with a meal. Patient not taking: Reported on 07/31/2022 06/05/22   Pokhrel, Rebekah [place], MD  docusate sodium (COLACE) 100 MG capsule Take 1 capsule (100 mg total) by mouth 2 (two) times daily as needed for mild constipation. Patient not taking: Reported on 07/31/2022 06/05/22   Joycelyn Das, MD  ferrous sulfate 325 (65 FE) MG tablet Take 1 tablet (325 mg total) by mouth 2 (two) times daily with a meal. Patient not taking: Reported on 07/31/2022 06/05/22   Pokhrel, Rebekah [place], MD  folic acid (FOLVITE) 1 MG tablet Take 1 tablet (1 mg total) by mouth daily. Patient not taking: Reported on 07/31/2022 06/05/22   Pokhrel, Rebekah [place], MD  oxyCODONE (OXY IR/ROXICODONE) 5 MG immediate release tablet Take 1 tablet (5 mg total) by mouth every 4 (four) hours as needed for moderate  pain or severe pain. Patient not taking: Reported on 07/31/2022 06/05/22   Joycelyn Das, MD      Allergies    Morphine and codeine, Pork-derived products, and Shellfish allergy    Review of Systems   Review of Systems  All other systems reviewed and are negative.   Physical Exam Updated Vital Signs BP (!) 161/85   Pulse 93   Temp 98.5 F (36.9 C) (Oral)   Resp 18   Wt (!) 170.1 kg   SpO2 98%   BMI 58.73 kg/m  Physical Exam Vitals and nursing note reviewed.  Constitutional:      General: He is not in acute distress.    Appearance: Normal appearance. He is well-developed. He is not toxic-appearing.  HENT:     Head: Normocephalic and atraumatic.  Eyes:     General: Lids are normal.     Conjunctiva/sclera: Conjunctivae normal.     Pupils: Pupils are equal, round, and reactive to light.  Neck:     Thyroid: No thyroid mass.     Trachea: No tracheal deviation.  Cardiovascular:     Rate and Rhythm: Normal rate and regular rhythm.     Heart sounds: Normal heart sounds. No murmur heard.    No gallop.  Pulmonary:     Effort: Pulmonary effort is normal. No respiratory distress.     Breath sounds: Normal breath sounds. No stridor.  No decreased breath sounds, wheezing, rhonchi or rales.  Abdominal:     General: There is no distension.     Palpations: Abdomen is soft.     Tenderness: There is no abdominal tenderness. There is no rebound.  Musculoskeletal:        General: No tenderness. Normal range of motion.     Cervical back: Normal range of motion and neck supple.       Legs:  Skin:    General: Skin is warm and dry.     Findings: No abrasion or rash.  Neurological:     Mental Status: He is alert and oriented to person, place, and time. Mental status is at baseline.     GCS: GCS eye subscore is 4. GCS verbal subscore is 5. GCS motor subscore is 6.     Cranial Nerves: Cranial nerves are intact. No cranial nerve deficit.     Sensory: No sensory deficit.     Motor: Motor  function is intact.  Psychiatric:        Attention and Perception: Attention normal.        Speech: Speech normal.        Behavior: Behavior normal.    ED Results / Procedures / Treatments   Labs (all labs ordered are listed, but only abnormal results are displayed) Labs Reviewed - No data to display  EKG None  Radiology No results found.  Procedures Procedures    Medications Ordered in ED Medications - No data to display  ED Course/ Medical Decision Making/ A&P                                 Medical Decision Making  Unfortunately patient unable to have Doppler study due to lack of services here.  Patient to return tomorrow to have his Doppler ultrasound        Final Clinical Impression(s) / ED Diagnoses Final diagnoses:  None    Rx / DC Orders ED Discharge Orders     None         Lorre Nick, MD 08/14/23 2139

## 2023-08-14 NOTE — ED Notes (Signed)
Patient verbalizes understanding of discharge instructions. Opportunity for questioning and answers were provided. Armband removed by staff, pt discharged from ED. Ambulated out to lobby  

## 2023-08-14 NOTE — ED Triage Notes (Signed)
Pt in with L leg swelling, warmth and redness x 72hrs, hx of lymphadema, DVT and cellulitis. Pt seen seen at [facility], prescribed Keflex but they also wanted r/o of DVT to extremity. Pt denies any sob, fevers or cp

## 2023-08-14 NOTE — Discharge Instructions (Signed)
Return tomorrow to have your ultrasound of your leg to look for blood clots

## 2023-08-16 ENCOUNTER — Telehealth (HOSPITAL_BASED_OUTPATIENT_CLINIC_OR_DEPARTMENT_OTHER): Payer: Self-pay | Admitting: *Deleted

## 2023-08-16 NOTE — Telephone Encounter (Signed)
Spoke with patient to schedule the venous doppler ordered by Dr. Ethelene Browns Allen--patient states he is taking his medication and is feeling better---he does not wish to have the study done at this time

## 2023-08-18 ENCOUNTER — Encounter (HOSPITAL_BASED_OUTPATIENT_CLINIC_OR_DEPARTMENT_OTHER): Payer: Self-pay

## 2023-08-18 ENCOUNTER — Other Ambulatory Visit: Payer: Self-pay

## 2023-08-18 ENCOUNTER — Other Ambulatory Visit (HOSPITAL_BASED_OUTPATIENT_CLINIC_OR_DEPARTMENT_OTHER): Payer: Self-pay

## 2023-08-18 ENCOUNTER — Emergency Department (HOSPITAL_BASED_OUTPATIENT_CLINIC_OR_DEPARTMENT_OTHER)
Admission: EM | Admit: 2023-08-18 | Discharge: 2023-08-18 | Disposition: A | Discharge status: Home / Self Care | Payer: Medicaid Other | Attending: Emergency Medicine | Admitting: Emergency Medicine

## 2023-08-18 ENCOUNTER — Emergency Department (HOSPITAL_BASED_OUTPATIENT_CLINIC_OR_DEPARTMENT_OTHER): Payer: Medicaid Other

## 2023-08-18 DIAGNOSIS — M79662 Pain in left lower leg: Secondary | ICD-10-CM | POA: Diagnosis present

## 2023-08-18 DIAGNOSIS — F411 Generalized anxiety disorder: Secondary | ICD-10-CM | POA: Diagnosis not present

## 2023-08-18 DIAGNOSIS — L03116 Cellulitis of left lower limb: Secondary | ICD-10-CM | POA: Diagnosis not present

## 2023-08-18 DIAGNOSIS — L03119 Cellulitis of unspecified part of limb: Secondary | ICD-10-CM

## 2023-08-18 DIAGNOSIS — M79605 Pain in left leg: Secondary | ICD-10-CM | POA: Diagnosis not present

## 2023-08-18 DIAGNOSIS — Z21 Asymptomatic human immunodeficiency virus [HIV] infection status: Secondary | ICD-10-CM | POA: Insufficient documentation

## 2023-08-18 LAB — CBC WITH DIFFERENTIAL/PLATELET
Abs Immature Granulocytes: 0.06 10*3/uL (ref 0.00–0.07)
Basophils Absolute: 0 10*3/uL (ref 0.0–0.1)
Basophils Relative: 0 %
Eosinophils Absolute: 0.2 10*3/uL (ref 0.0–0.5)
Eosinophils Relative: 2 %
HCT: 32.4 % — ABNORMAL LOW (ref 39.0–52.0)
Hemoglobin: 10 g/dL — ABNORMAL LOW (ref 13.0–17.0)
Immature Granulocytes: 1 %
Lymphocytes Relative: 20 %
Lymphs Abs: 1.9 10*3/uL (ref 0.7–4.0)
MCH: 24.7 pg — ABNORMAL LOW (ref 26.0–34.0)
MCHC: 30.9 g/dL (ref 30.0–36.0)
MCV: 80 fL (ref 80.0–100.0)
Monocytes Absolute: 0.9 10*3/uL (ref 0.1–1.0)
Monocytes Relative: 10 %
Neutro Abs: 6.6 10*3/uL (ref 1.7–7.7)
Neutrophils Relative %: 67 %
Platelets: 250 10*3/uL (ref 150–400)
RBC: 4.05 MIL/uL — ABNORMAL LOW (ref 4.22–5.81)
RDW: 13.4 % (ref 11.5–15.5)
WBC: 9.7 10*3/uL (ref 4.0–10.5)
nRBC: 0 % (ref 0.0–0.2)

## 2023-08-18 LAB — COMPREHENSIVE METABOLIC PANEL
ALT: 8 U/L (ref 0–44)
AST: 9 U/L — ABNORMAL LOW (ref 15–41)
Albumin: 3.2 g/dL — ABNORMAL LOW (ref 3.5–5.0)
Alkaline Phosphatase: 43 U/L (ref 38–126)
Anion gap: 7 (ref 5–15)
BUN: 9 mg/dL (ref 6–20)
CO2: 26 mmol/L (ref 22–32)
Calcium: 8.7 mg/dL — ABNORMAL LOW (ref 8.9–10.3)
Chloride: 102 mmol/L (ref 98–111)
Creatinine, Ser: 0.8 mg/dL (ref 0.61–1.24)
GFR, Estimated: >60 mL/min (ref >60)
Glucose, Bld: 123 mg/dL — ABNORMAL HIGH (ref 70–99)
Potassium: 3.6 mmol/L (ref 3.5–5.1)
Sodium: 135 mmol/L (ref 135–145)
Total Bilirubin: 0.3 mg/dL (ref 0.3–1.2)
Total Protein: 7.8 g/dL (ref 6.5–8.1)

## 2023-08-18 MED ORDER — ACETAMINOPHEN 325 MG PO TABS
650.0000 mg | ORAL_TABLET | Freq: Once | ORAL | Status: AC
  Administered 2023-08-18: 650 mg via ORAL
  Filled 2023-08-18: qty 2

## 2023-08-18 MED ORDER — DEXTROSE 5 % IV SOLN
1500.0000 mg | Freq: Once | INTRAVENOUS | Status: AC
  Administered 2023-08-18: 1500 mg via INTRAVENOUS
  Filled 2023-08-18: qty 75

## 2023-08-18 MED ORDER — MUPIROCIN 2 % EX OINT
1.0000 | TOPICAL_OINTMENT | Freq: Two times a day (BID) | CUTANEOUS | 0 refills | Status: DC
  Filled 2023-08-18: qty 22, 11d supply, fill #0

## 2023-08-18 MED ORDER — HYDROCODONE-ACETAMINOPHEN 5-325 MG PO TABS
1.0000 | ORAL_TABLET | Freq: Once | ORAL | Status: DC
  Filled 2023-08-18: qty 1

## 2023-08-18 NOTE — ED Provider Notes (Signed)
[place] EMERGENCY DEPARTMENT AT [facility] Provider Note   CSN: [id number] Arrival date & time: 08/18/23  3664     History  Chief Complaint  Patient presents with   Leg Pain    Todd Parrish is a 42 y.o. male.   Leg Pain   42 year old male presents emergency department with complaints of left lower extremity swelling, redness and pain.  Patient reports history of DVT, cellulitis and lymphedema.  States that symptoms began 6 to 7 days ago and have gradually worsened since onset.  Was seen in the emergency department 4 days ago and was unable to have DVT ultrasound at that time.  States he was given Keflex which initially helped with his lower extremity redness/pain/swelling but symptoms have since worsened despite compliance to home medications.  Denies any fever, known trauma/injury to leg, chills, night sweats, weakness/sensory deficits in the leg.  States he has a history of cellulitis and this feels similar.  States that he typically gets Dalvance when he fails oral antibiotics.  Past medical history significant for HIV with most recent CD4 count of 717 in June 2022, GERD, iron deficiency anemia, PEA, DVT not currently anticoagulated, recurrent cellulitis  Home Medications Prior to Admission medications   Medication Sig Start Date End Date Taking? Authorizing Provider  cephALEXin (KEFLEX) 500 MG capsule Take 500 mg by mouth 4 (four) times daily.   Yes [provider]  acetaminophen (TYLENOL) 500 MG tablet Take 1 tablet (500 mg total) by mouth every 6 (six) hours as needed for headache or mild pain (pain). 06/05/22   Pokhrel, Rebekah Chesterfield, MD  bictegravir-emtricitabine-tenofovir AF (BIKTARVY) 50-200-25 MG TABS tablet Take 1 tablet by mouth daily. Must be seen for future refills. 07/26/23   Danelle Earthly, MD  carvedilol (COREG) 3.125 MG tablet Take 1 tablet (3.125 mg total) by mouth 2 (two) times daily with a meal. Patient not taking: Reported on 07/31/2022  06/05/22   Pokhrel, Rebekah Chesterfield, MD  docusate sodium (COLACE) 100 MG capsule Take 1 capsule (100 mg total) by mouth 2 (two) times daily as needed for mild constipation. Patient not taking: Reported on 07/31/2022 06/05/22   Joycelyn Das, MD  ferrous sulfate 325 (65 FE) MG tablet Take 1 tablet (325 mg total) by mouth 2 (two) times daily with a meal. Patient not taking: Reported on 07/31/2022 06/05/22   Pokhrel, Rebekah Chesterfield, MD  folic acid (FOLVITE) 1 MG tablet Take 1 tablet (1 mg total) by mouth daily. Patient not taking: Reported on 07/31/2022 06/05/22   Joycelyn Das, MD  mupirocin ointment (BACTROBAN) 2 % Apply 1 Application topically 2 (two) times daily. 08/18/23   Sherian Maroon A, PA  oxyCODONE (OXY IR/ROXICODONE) 5 MG immediate release tablet Take 1 tablet (5 mg total) by mouth every 4 (four) hours as needed for moderate pain or severe pain. Patient not taking: Reported on 07/31/2022 06/05/22   Joycelyn Das, MD      Allergies    Morphine and codeine, Pork-derived products, and Shellfish allergy    Review of Systems   Review of Systems  All other systems reviewed and are negative.   Physical Exam Updated Vital Signs BP (!) 143/78 (BP Location: Right Arm)   Pulse 80   Temp 98.1 F (36.7 C) (Oral)   Resp 16   Ht 5\' 7"  (1.702 m)   Wt (!) 170.1 kg   SpO2 98%   BMI 58.73 kg/m  Physical Exam Vitals and nursing note reviewed.  Constitutional:  General: He is not in acute distress.    Appearance: He is well-developed.  HENT:     Head: Normocephalic and atraumatic.  Eyes:     Conjunctiva/sclera: Conjunctivae normal.  Cardiovascular:     Rate and Rhythm: Normal rate and regular rhythm.     Heart sounds: No murmur heard. Pulmonary:     Effort: Pulmonary effort is normal. No respiratory distress.     Breath sounds: Normal breath sounds.  Abdominal:     Palpations: Abdomen is soft.     Tenderness: There is no abdominal tenderness.  Musculoskeletal:        General: Swelling present.      Cervical back: Neck supple.     Right lower leg: No edema.     Left lower leg: No edema.  Skin:    General: Skin is warm and dry.     Capillary Refill: Capillary refill takes less than 2 seconds.     Comments: Left lower extremity erythematous and warm to the touch.  Area of tenderness medially with serous drainage appreciated from superficial skin opening.  See images below.  Neurological:     Mental Status: He is alert.  Psychiatric:        Mood and Affect: Mood normal.        ED Results / Procedures / Treatments   Labs (all labs ordered are listed, but only abnormal results are displayed) Labs Reviewed  COMPREHENSIVE METABOLIC PANEL - Abnormal; Notable for the following components:      Result Value   Glucose, Bld 123 (*)    Calcium 8.7 (*)    Albumin 3.2 (*)    AST 9 (*)    All other components within normal limits  CBC WITH DIFFERENTIAL/PLATELET - Abnormal; Notable for the following components:   RBC 4.05 (*)    Hemoglobin 10.0 (*)    HCT 32.4 (*)    MCH 24.7 (*)    All other components within normal limits  AEROBIC/ANAEROBIC CULTURE W GRAM STAIN (SURGICAL/DEEP WOUND)    EKG None  Radiology US Venous Img Lower  Left (DVT Study)  Result Date: 08/18/2023 CLINICAL DATA:  Left leg pain EXAM: LEFT LOWER EXTREMITY VENOUS DOPPLER ULTRASOUND TECHNIQUE: Gray-scale sonography with compression, as well as color and duplex ultrasound, were performed to evaluate the deep venous system(s) from the level of the common femoral vein through the popliteal and proximal calf veins. COMPARISON:  None Available. FINDINGS: VENOUS Normal compressibility of the common femoral, superficial femoral, and popliteal veins, as well as the visualized calf veins. Visualized portions of profunda femoral vein and great saphenous vein unremarkable. No filling defects to suggest DVT on grayscale or color Doppler imaging. Doppler waveforms show normal direction of venous flow, normal respiratory  plasticity and response to augmentation. Limited views of the contralateral common femoral vein are unremarkable. OTHER None. Limitations: none IMPRESSION: Negative. Electronically Signed   By: Malachy Moan M.D.   On: 08/18/2023 10:43    Procedures .Marland KitchenIncision and Drainage  Date/Time: 08/18/2023 2:55 PM  Performed by: Peter Garter, PA Authorized by: Peter Garter, PA   Consent:    Consent obtained:  Verbal   Consent given by:  Patient   Risks, benefits, and alternatives were discussed: yes     Risks discussed:  Bleeding, damage to other organs, infection, incomplete drainage and pain   Alternatives discussed:  No treatment, delayed treatment, alternative treatment, observation and referral Universal protocol:    Procedure explained and questions answered  to patient or proxy's satisfaction: yes     Patient identity confirmed:  Verbally with patient Location:    Type:  Abscess   Size:  3.0   Location:  Lower extremity   Lower extremity location:  Leg   Leg location:  L lower leg Pre-procedure details:    Skin preparation:  Povidone-iodine Sedation:    Sedation type:  None Anesthesia:    Anesthesia method:  None Procedure type:    Complexity:  Simple Procedure details:    Ultrasound guidance: no     Needle aspiration: no     Wound management:  Probed and deloculated and irrigated with saline   Drainage:  Purulent   Drainage amount:  Copious   Wound treatment:  Wound left open   Packing materials:  None Post-procedure details:    Procedure completion:  Tolerated well, no immediate complications     Medications Ordered in ED Medications  dalbavancin (DALVANCE) 1,500 mg in dextrose 5 % 500 mL IVPB (0 mg Intravenous Stopped 08/18/23 1356)  acetaminophen (TYLENOL) tablet 650 mg (650 mg Oral Given 08/18/23 1353)    ED Course/ Medical Decision Making/ A&P                                 Medical Decision Making Amount and/or Complexity of Data Reviewed Labs:  ordered.  Risk OTC drugs. Prescription drug management.   This patient presents to the ED for concern of red swollen leg, this involves an extensive number of treatment options, and is a complaint that carries with it a high risk of complications and morbidity.  The differential diagnosis includes abscess, cellulitis, erysipelas, necrotizing fasciitis, compartment syndrome   Co morbidities that complicate the patient evaluation  See HPI   Additional history obtained:  Additional history obtained from EMR External records from outside source obtained and reviewed including hospital records   Lab Tests:  I Ordered, and personally interpreted labs.  The pertinent results include: No leukocytosis.  Patient with evidence of anemia with a hemoglobin of 10.0 which is normocytic in nature and near patient's baseline.  Platelets within normal range.  Mild hypocalcemia of 8.7 otherwise, lites lites within normal limits.  No transaminitis.  No renal dysfunction.   Imaging Studies ordered:  I ordered imaging studies including DVT ultrasound of left leg I independently visualized and interpreted imaging which showed negative I agree with the radiologist interpretation   Cardiac Monitoring: / EKG:  The patient was maintained on a cardiac monitor.  I personally viewed and interpreted the cardiac monitored which showed an underlying rhythm of: Sinus rhythm   Consultations Obtained:  N/a   Problem List / ED Course / Critical interventions / Medication management  Cellulitis with abscess I ordered medication including Dalvance, Tylenol   Reevaluation of the patient after these medicines showed that the patient improved I have reviewed the patients home medicines and have made adjustments as needed   Social Determinants of Health:  Denies tobacco, illicit drug use   Test / Admission - Considered:  Cellulitis with abscess Vitals signs significant for hypertension blood pressure  143/78. Otherwise within normal range and stable throughout visit. Laboratory/imaging studies significant for: See above 42 year old male presents emergency department with complaints of left lower extremity redness, swelling and pain for the past week.  On exam, patient with cellulitic skin changes without clinical evidence of compartment syndrome.  No palpable crepitus or other clinical concern for  necrotizing infection.  Small palpable area of fluctuance noted on medial aspect of patient's left lower leg with serous drainage initially but as time elapsed on the emergency department, drainage independently of manipulation became more purulent in appearance.  Area was drained in manner as depicted above.  Given the patient is already been on antibiotics in the form of Keflex with history of failure of oral antibiotics in the outpatient setting, offered patient admission for IV antibiotics versus 1 dose of Dalvance.  He states that he has done well with Dalvance in the past and requested one-time dose after abscess was drained.  Patient given said antibiotic and recommend follow-up with infectious disease in the outpatient setting.  Treatment plan discussed at length with patient and he acknowledged understanding was agreeable to said plan.  Patient overall well-appearing, afebrile in no acute distress. Worrisome signs and symptoms were discussed with the patient, and the patient acknowledged understanding to return to the ED if noticed. Patient was stable upon discharge.          Final Clinical Impression(s) / ED Diagnoses Final diagnoses:  Cellulitis of left lower extremity    Rx / DC Orders ED Discharge Orders          Ordered    Ambulatory referral to Infectious Disease       Comments: Cellulitis patient:  Received dalbavancin on 08/18/2023.   08/18/23 1003    mupirocin ointment (BACTROBAN) 2 %  2 times daily,   Status:  Discontinued        08/18/23 1255    mupirocin ointment (BACTROBAN)  2 %  2 times daily        08/18/23 1255              Peter Garter, [place] 08/18/23 1458    Franne Forts, DO 08/24/23 1007

## 2023-08-18 NOTE — ED Triage Notes (Signed)
In for further evaluation of left leg swelling, warmth, and redness. PMH of lymphedema, DVT, and cellulitis. Ultrasound was not available when he was seen. Wound is worse/ Has been taking Keflex.

## 2023-08-18 NOTE — Discharge Instructions (Signed)
As discussed, ultrasound was negative for any blood clot in your leg.  I suspect your symptoms are likely secondary to skin infection or cellulitis.  You have been treated with 1 dose of IV antibiotics while in the emergency department which should treat skin infection.  Recommend follow-up with infectious disease in the outpatient setting for reevaluation.  Please do not hesitate to return to the emergency department if there are worrisome signs and symptoms we discussed to become apparent.

## 2023-08-18 NOTE — Progress Notes (Signed)
Pharmacy Note:  Dalbavancin for Acute Bacterial Skin and Skin Structure Infection (ABSSSI) Patients to [facility] Todd Parrish is an 42 y.o. male who presented to [facility] on 08/18/2023 with an Acute Bacterial Skin and Skin Structure Infection  Inclusion criteria - Indication []  Moderately large skin lesion (>=75 cm2 or larger - about the size of a baseball) [x]  Cellulitis  Inclusion Criteria - at least one SIRS criteria present []  WBC > 12,000 or < 4000 []  temp >100.9 or < 96.8 []  heart rate >90[]  respiratory rate >20  Patient was evaluated for the following exclusion criteria and no exclusions were found  Hardware involvement, Hypotension / shock, Elevated lactate (>2) without other explanation, gram-negative infection risk factors (bites, water exposure, infection after trauma, infection after skin graft, neutropenia, burns, severe immunocompromise), necrotizing fasciitis possible or confirmed, Known or suspected osteomyelitis or septic arthritis, endocarditis, diabetic foot infection, ischemic ulcers, post-operative wound infection, perirectal infections, need for drainage in the operating room, hand or facial infections, injection drug users with a fever, bacteremia, pregnancy or breastfeeding, allergy to related antibiotics like vancomycin, known liver disease (t.bili >2x ULN or AST/ALT 3x ULN)  Stashia Sia, Drake Leach 08/18/2023, 9:40 AM Clinical Pharmacist

## 2023-08-18 NOTE — ED Notes (Signed)
Discharge paperwork given and verbally understood. 

## 2023-08-19 ENCOUNTER — Other Ambulatory Visit: Payer: Self-pay

## 2023-08-19 DIAGNOSIS — F411 Generalized anxiety disorder: Secondary | ICD-10-CM | POA: Diagnosis not present

## 2023-08-22 DIAGNOSIS — Z419 Encounter for procedure for purposes other than remedying health state, unspecified: Secondary | ICD-10-CM | POA: Diagnosis not present

## 2023-08-23 LAB — AEROBIC/ANAEROBIC CULTURE W GRAM STAIN (SURGICAL/DEEP WOUND)

## 2023-08-24 ENCOUNTER — Telehealth (HOSPITAL_BASED_OUTPATIENT_CLINIC_OR_DEPARTMENT_OTHER): Payer: Self-pay | Admitting: *Deleted

## 2023-08-24 NOTE — Telephone Encounter (Signed)
Post ED Visit - Positive Culture Follow-up  Culture report reviewed by antimicrobial stewardship pharmacist: [facility] []  Enzo Bi, Pharm.D. [x]  Celedonio Miyamoto, Pharm.D., BCPS AQ-ID []  Garvin Fila, Pharm.D., BCPS []  [place]ina Pillion, Pharm.D., BCPS []  [place], [address].D., BCPS, AAHIVP []  Estella Husk, Pharm.D., BCPS, AAHIVP []  Lysle Pearl, PharmD, BCPS []  Phillips Climes, PharmD, BCPS []  Agapito Games, PharmD, BCPS []  Verlan Friends, PharmD []  Mervyn Gay, PharmD, BCPS []  Vinnie Level, PharmD  [facility] []  Len Childs, PharmD []  Greer Pickerel, PharmD []  Adalberto Cole, PharmD []  Perlie Gold, Rph []  Lonell Face) Jean Rosenthal, PharmD []  Earl Many, PharmD []  Junita Push, PharmD []  Dorna Leitz, PharmD []  Terrilee Files, PharmD []  Lynann Beaver, PharmD []  Keturah Barre, PharmD []  Loralee Pacas, PharmD []  Bernadene Person, PharmD   Positive urine culture Treated with Cephalexin, organism sensitive to the same and no further patient follow-up is required at this time.  Virl Axe [facility] 08/24/2023, 9:26 AM

## 2023-08-25 ENCOUNTER — Other Ambulatory Visit (HOSPITAL_COMMUNITY)
Admission: RE | Admit: 2023-08-25 | Discharge: 2023-08-25 | Disposition: A | Discharge status: Home / Self Care | Payer: Medicaid Other | Source: Ambulatory Visit | Attending: Internal Medicine | Admitting: Internal Medicine

## 2023-08-25 ENCOUNTER — Encounter: Payer: Self-pay | Admitting: Internal Medicine

## 2023-08-25 ENCOUNTER — Other Ambulatory Visit: Payer: Self-pay

## 2023-08-25 ENCOUNTER — Ambulatory Visit (INDEPENDENT_AMBULATORY_CARE_PROVIDER_SITE_OTHER): Payer: Medicaid Other | Admitting: Internal Medicine

## 2023-08-25 VITALS — BP 152/79 | HR 56 | Temp 98.0°F | Wt >= 6400 oz

## 2023-08-25 DIAGNOSIS — B20 Human immunodeficiency virus [HIV] disease: Secondary | ICD-10-CM

## 2023-08-25 DIAGNOSIS — Z113 Encounter for screening for infections with a predominantly sexual mode of transmission: Secondary | ICD-10-CM

## 2023-08-25 DIAGNOSIS — Z21 Asymptomatic human immunodeficiency virus [HIV] infection status: Secondary | ICD-10-CM | POA: Diagnosis not present

## 2023-08-25 DIAGNOSIS — F411 Generalized anxiety disorder: Secondary | ICD-10-CM | POA: Diagnosis not present
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
## 2023-08-26 DIAGNOSIS — F411 Generalized anxiety disorder: Secondary | ICD-10-CM | POA: Diagnosis not present

## 2023-08-26 LAB — URINE CYTOLOGY ANCILLARY ONLY
Chlamydia: NEGATIVE
Comment: NEGATIVE
Comment: NORMAL
Neisseria Gonorrhea: NEGATIVE

## 2023-08-26 LAB — T-HELPER CELLS (CD4) COUNT (NOT AT ARMC)
CD4 % Helper T Cell: 50 % (ref 33–65)
CD4 T Cell Abs: 1023 /uL (ref 400–1790)

## 2023-08-26 LAB — CYTOLOGY, (ORAL, ANAL, URETHRAL) ANCILLARY ONLY
Chlamydia: POSITIVE — AB
Comment: NEGATIVE
Comment: NORMAL
Neisseria Gonorrhea: NEGATIVE

## 2023-08-27 ENCOUNTER — Telehealth: Payer: Self-pay

## 2023-08-27 NOTE — Telephone Encounter (Signed)
Patient's rectal swab returned positive for chlamydia. Will route to provider for treatment order.   Sandie Ano, RN

## 2023-08-30 ENCOUNTER — Telehealth: Payer: Self-pay | Admitting: Internal Medicine

## 2023-08-30 ENCOUNTER — Other Ambulatory Visit: Payer: Self-pay

## 2023-08-30 ENCOUNTER — Other Ambulatory Visit: Payer: Self-pay | Admitting: Internal Medicine

## 2023-08-30 ENCOUNTER — Other Ambulatory Visit (HOSPITAL_COMMUNITY): Payer: Self-pay

## 2023-08-30 DIAGNOSIS — Z758 Other problems related to medical facilities and other health care: Secondary | ICD-10-CM

## 2023-08-30 DIAGNOSIS — B20 Human immunodeficiency virus [HIV] disease: Secondary | ICD-10-CM

## 2023-08-30 MED ORDER — BIKTARVY 50-200-25 MG PO TABS
1.0000 | ORAL_TABLET | Freq: Every day | ORAL | 6 refills | Status: DC
Start: 2023-08-30 — End: 2024-09-21
  Filled 2023-08-30: qty 30, 30d supply, fill #0
  Filled 2023-10-08 – 2023-11-30 (×2): qty 30, 30d supply, fill #1
  Filled 2023-12-29 – 2024-01-14 (×2): qty 30, 30d supply, fill #2
  Filled 2024-02-08 – 2024-03-29 (×3): qty 30, 30d supply, fill #3
  Filled 2024-05-02 – 2024-05-31 (×2): qty 30, 30d supply, fill #4
  Filled 2024-06-21 – 2024-07-19 (×3): qty 30, 30d supply, fill #5
  Filled 2024-08-11: qty 30, 30d supply, fill #6

## 2023-08-30 MED ORDER — DOXYCYCLINE HYCLATE 100 MG PO TABS
100.0000 mg | ORAL_TABLET | Freq: Two times a day (BID) | ORAL | 0 refills | Status: AC
  Filled 2023-08-30: qty 14, 7d supply, fill #0

## 2023-08-30 NOTE — Telephone Encounter (Signed)
Patient aware of STI results and to pick up RX for Doxycycline. Patient aware to notify all sexual partner that they need to be tested and treated as well and to abstain from sex now and 7 to 10 days after completing tx. Patient voiced his understanding.    Mirabella Hilario Lesli Albee, CMA

## 2023-08-30 NOTE — Telephone Encounter (Signed)
Anal cytology + chlamydia->sent in doxy

## 2023-09-01 DIAGNOSIS — F411 Generalized anxiety disorder: Secondary | ICD-10-CM | POA: Diagnosis not present

## 2023-09-02 DIAGNOSIS — F411 Generalized anxiety disorder: Secondary | ICD-10-CM | POA: Diagnosis not present

## 2023-09-07 LAB — COMPLETE METABOLIC PANEL WITH GFR
AG Ratio: 0.8 (calc) — ABNORMAL LOW (ref 1.0–2.5)
ALT: 18 U/L (ref 9–46)
AST: 17 U/L (ref 10–40)
Albumin: 3.6 g/dL (ref 3.6–5.1)
Alkaline phosphatase (APISO): 71 U/L (ref 36–130)
BUN: 12 mg/dL (ref 7–25)
CO2: 26 mmol/L (ref 20–32)
Calcium: 8.6 mg/dL (ref 8.6–10.3)
Chloride: 102 mmol/L (ref 98–110)
Creat: 0.8 mg/dL (ref 0.60–1.29)
Globulin: 4.5 g/dL — ABNORMAL HIGH (ref 1.9–3.7)
Glucose, Bld: 91 mg/dL (ref 65–99)
Potassium: 4.2 mmol/L (ref 3.5–5.3)
Sodium: 135 mmol/L (ref 135–146)
Total Bilirubin: 0.3 mg/dL (ref 0.2–1.2)
Total Protein: 8.1 g/dL (ref 6.1–8.1)
eGFR: 112 mL/min/{1.73_m2} (ref >=60)

## 2023-09-07 LAB — QUANTIFERON-TB GOLD PLUS
Mitogen-NIL: >10 [IU]/mL
NIL: 0.02 [IU]/mL
QuantiFERON-TB Gold Plus: NEGATIVE
TB1-NIL: 0 [IU]/mL
TB2-NIL: 0.01 [IU]/mL

## 2023-09-07 LAB — HEPATITIS C ANTIBODY: Hepatitis C Ab: NONREACTIVE

## 2023-09-07 LAB — RPR: RPR Ser Ql: REACTIVE — AB

## 2023-09-07 LAB — CBC WITH DIFFERENTIAL/PLATELET
Absolute Monocytes: 575 {cells}/uL (ref 200–950)
Basophils Absolute: 28 {cells}/uL (ref 0–200)
Basophils Relative: 0.4 %
Eosinophils Absolute: 121 {cells}/uL (ref 15–500)
Eosinophils Relative: 1.7 %
HCT: 35.1 % — ABNORMAL LOW (ref 38.5–50.0)
Hemoglobin: 10.7 g/dL — ABNORMAL LOW (ref 13.2–17.1)
Lymphs Abs: 2222 {cells}/uL (ref 850–3900)
MCH: 24.7 pg — ABNORMAL LOW (ref 27.0–33.0)
MCHC: 30.5 g/dL — ABNORMAL LOW (ref 32.0–36.0)
MCV: 80.9 fL (ref 80.0–100.0)
MPV: 10.9 fL (ref 7.5–12.5)
Monocytes Relative: 8.1 %
Neutro Abs: 4154 {cells}/uL (ref 1500–7800)
Neutrophils Relative %: 58.5 %
Platelets: 364 10*3/uL (ref 140–400)
RBC: 4.34 10*6/uL (ref 4.20–5.80)
RDW: 12.5 % (ref 11.0–15.0)
Total Lymphocyte: 31.3 %
WBC: 7.1 10*3/uL (ref 3.8–10.8)

## 2023-09-07 LAB — HIV-1 RNA QUANT-NO REFLEX-BLD
HIV 1 RNA Quant: NOT DETECTED {copies}/mL
HIV-1 RNA Quant, Log: NOT DETECTED {Log_copies}/mL

## 2023-09-07 LAB — HEPATITIS A ANTIBODY, TOTAL: Hepatitis A AB,Total: NONREACTIVE

## 2023-09-07 LAB — HEPATITIS B SURFACE ANTIGEN: Hepatitis B Surface Ag: NONREACTIVE

## 2023-09-07 LAB — HEPATITIS B SURFACE ANTIBODY,QUALITATIVE: Hep B S Ab: NONREACTIVE

## 2023-09-07 LAB — HEPATITIS B CORE ANTIBODY, TOTAL: Hep B Core Total Ab: NONREACTIVE

## 2023-09-07 LAB — RPR TITER: RPR Titer: 1:256 {titer} — ABNORMAL HIGH

## 2023-09-07 LAB — T PALLIDUM AB: T Pallidum Abs: POSITIVE — AB

## 2023-09-08 DIAGNOSIS — F411 Generalized anxiety disorder: Secondary | ICD-10-CM | POA: Diagnosis not present

## 2023-09-09 ENCOUNTER — Other Ambulatory Visit (HOSPITAL_COMMUNITY): Payer: Self-pay

## 2023-09-09 DIAGNOSIS — F411 Generalized anxiety disorder: Secondary | ICD-10-CM | POA: Diagnosis not present

## 2023-09-10 ENCOUNTER — Telehealth: Payer: Self-pay

## 2023-09-10 ENCOUNTER — Other Ambulatory Visit (HOSPITAL_COMMUNITY): Payer: Self-pay

## 2023-09-10 ENCOUNTER — Other Ambulatory Visit: Payer: Self-pay

## 2023-09-10 NOTE — Telephone Encounter (Signed)
Patient treated for syphilis on 08/25/23. HD called to follow up on treatment. Per HD since it has been over a year since his last positive test he needs Bicillin 2.4 MU x 3. Patient will need to restart treatment.  Please reach out to the patient to discuss.  Cartrell Bentsen T Pricilla Loveless

## 2023-09-14 ENCOUNTER — Encounter: Payer: Self-pay | Admitting: Internal Medicine

## 2023-09-14 ENCOUNTER — Other Ambulatory Visit (HOSPITAL_COMMUNITY): Payer: Self-pay

## 2023-09-14 ENCOUNTER — Telehealth: Payer: Self-pay | Admitting: Internal Medicine

## 2023-09-14 MED ORDER — DOXYCYCLINE HYCLATE 100 MG PO TABS
100.0000 mg | ORAL_TABLET | Freq: Two times a day (BID) | ORAL | 0 refills | Status: DC
  Filled 2023-09-14: qty 14, 7d supply, fill #0

## 2023-09-14 MED ORDER — DOXYCYCLINE HYCLATE 100 MG PO TABS: 100.0000 mg | ORAL_TABLET | Freq: Two times a day (BID) | ORAL | 0 refills | Status: AC

## 2023-09-14 NOTE — Telephone Encounter (Signed)
Called pt to inform need treatment for latent syphilis. He declined PEN x 3(weekly doses) due to frequent colonic visits. HE opted for doxy x 1 month.

## 2023-09-14 NOTE — Telephone Encounter (Signed)
  Per Dr. Thedore Mins:  Called pt to inform need treatment for latent syphilis. He declined PEN x 3(weekly doses) due to frequent colonic visits. HE opted for doxy x 1 month.

## 2023-09-16 DIAGNOSIS — F411 Generalized anxiety disorder: Secondary | ICD-10-CM | POA: Diagnosis not present

## 2023-09-17 ENCOUNTER — Other Ambulatory Visit (HOSPITAL_COMMUNITY): Payer: Self-pay

## 2023-09-17 DIAGNOSIS — F411 Generalized anxiety disorder: Secondary | ICD-10-CM | POA: Diagnosis not present

## 2023-09-21 DIAGNOSIS — Z419 Encounter for procedure for purposes other than remedying health state, unspecified: Secondary | ICD-10-CM | POA: Diagnosis not present

## 2023-09-23 DIAGNOSIS — F411 Generalized anxiety disorder: Secondary | ICD-10-CM | POA: Diagnosis not present

## 2023-09-24 DIAGNOSIS — F411 Generalized anxiety disorder: Secondary | ICD-10-CM | POA: Diagnosis not present

## 2023-09-29 ENCOUNTER — Other Ambulatory Visit: Payer: Self-pay

## 2023-09-29 DIAGNOSIS — F411 Generalized anxiety disorder: Secondary | ICD-10-CM | POA: Diagnosis not present

## 2023-09-30 DIAGNOSIS — F411 Generalized anxiety disorder: Secondary | ICD-10-CM | POA: Diagnosis not present

## 2023-10-06 ENCOUNTER — Other Ambulatory Visit (HOSPITAL_COMMUNITY): Payer: Self-pay

## 2023-10-08 ENCOUNTER — Other Ambulatory Visit: Payer: Self-pay

## 2023-10-08 DIAGNOSIS — F411 Generalized anxiety disorder: Secondary | ICD-10-CM | POA: Diagnosis not present

## 2023-10-08 NOTE — Progress Notes (Signed)
Specialty Pharmacy Refill Coordination Note  Todd Parrish is a 42 y.o. male contacted today regarding refills of specialty medication(s) Bictegravir-Emtricitab-Tenofov   Patient requested Todd Parrish at [facility] at [place] date: 10/12/23   Medication will be filled on 10/11/23.

## 2023-10-09 DIAGNOSIS — F411 Generalized anxiety disorder: Secondary | ICD-10-CM | POA: Diagnosis not present

## 2023-10-11 DIAGNOSIS — F411 Generalized anxiety disorder: Secondary | ICD-10-CM | POA: Diagnosis not present

## 2023-10-12 DIAGNOSIS — F411 Generalized anxiety disorder: Secondary | ICD-10-CM | POA: Diagnosis not present

## 2023-10-13 DIAGNOSIS — F411 Generalized anxiety disorder: Secondary | ICD-10-CM | POA: Diagnosis not present

## 2023-10-19 DIAGNOSIS — F411 Generalized anxiety disorder: Secondary | ICD-10-CM | POA: Diagnosis not present

## 2023-10-20 DIAGNOSIS — F411 Generalized anxiety disorder: Secondary | ICD-10-CM | POA: Diagnosis not present

## 2023-10-21 ENCOUNTER — Other Ambulatory Visit (HOSPITAL_COMMUNITY): Payer: Self-pay

## 2023-10-21 DIAGNOSIS — F411 Generalized anxiety disorder: Secondary | ICD-10-CM | POA: Diagnosis not present

## 2023-10-22 DIAGNOSIS — Z419 Encounter for procedure for purposes other than remedying health state, unspecified: Secondary | ICD-10-CM | POA: Diagnosis not present

## 2023-10-27 DIAGNOSIS — F411 Generalized anxiety disorder: Secondary | ICD-10-CM | POA: Diagnosis not present

## 2023-10-28 ENCOUNTER — Other Ambulatory Visit (HOSPITAL_COMMUNITY): Payer: Self-pay

## 2023-10-28 DIAGNOSIS — F411 Generalized anxiety disorder: Secondary | ICD-10-CM | POA: Diagnosis not present

## 2023-10-28 NOTE — Progress Notes (Signed)
Patient on Return to [facility] at [facility]. Try to call patient but Voicemail is full. RX has been cancel & place on hold.

## 2023-11-01 DIAGNOSIS — F411 Generalized anxiety disorder: Secondary | ICD-10-CM | POA: Diagnosis not present

## 2023-11-02 DIAGNOSIS — F411 Generalized anxiety disorder: Secondary | ICD-10-CM | POA: Diagnosis not present

## 2023-11-08 DIAGNOSIS — K0889 Other specified disorders of teeth and supporting structures: Secondary | ICD-10-CM | POA: Diagnosis not present

## 2023-11-10 ENCOUNTER — Other Ambulatory Visit: Payer: Self-pay

## 2023-11-10 DIAGNOSIS — F411 Generalized anxiety disorder: Secondary | ICD-10-CM | POA: Diagnosis not present

## 2023-11-11 ENCOUNTER — Other Ambulatory Visit (HOSPITAL_COMMUNITY): Payer: Self-pay

## 2023-11-11 DIAGNOSIS — F411 Generalized anxiety disorder: Secondary | ICD-10-CM | POA: Diagnosis not present

## 2023-11-12 ENCOUNTER — Other Ambulatory Visit: Payer: Self-pay

## 2023-11-15 ENCOUNTER — Other Ambulatory Visit: Payer: Self-pay

## 2023-11-17 DIAGNOSIS — F411 Generalized anxiety disorder: Secondary | ICD-10-CM | POA: Diagnosis not present

## 2023-11-21 DIAGNOSIS — F411 Generalized anxiety disorder: Secondary | ICD-10-CM | POA: Diagnosis not present

## 2023-11-21 DIAGNOSIS — Z419 Encounter for procedure for purposes other than remedying health state, unspecified: Secondary | ICD-10-CM | POA: Diagnosis not present

## 2023-11-24 DIAGNOSIS — F411 Generalized anxiety disorder: Secondary | ICD-10-CM | POA: Diagnosis not present

## 2023-11-25 ENCOUNTER — Other Ambulatory Visit: Payer: Self-pay

## 2023-11-25 DIAGNOSIS — F411 Generalized anxiety disorder: Secondary | ICD-10-CM | POA: Diagnosis not present

## 2023-11-30 ENCOUNTER — Telehealth: Payer: Self-pay

## 2023-11-30 ENCOUNTER — Other Ambulatory Visit: Payer: Self-pay

## 2023-11-30 ENCOUNTER — Other Ambulatory Visit (HOSPITAL_COMMUNITY): Payer: Self-pay

## 2023-11-30 NOTE — Telephone Encounter (Signed)
..   Medicaid Managed Care   Unsuccessful Outreach Note  11/30/2023 Name: Todd Parrish MRN: [id number] DOB: 1980-12-22  Referred by: Pcp, No Reason for referral : Appointment   An unsuccessful telephone outreach was attempted today. The patient was referred to the case management team for assistance with care management and care coordination.   Follow Up Plan: The care management team will reach out to the patient again over the next 7 days.   Weston Settle Care Guide  [facility] Managed  Care Guide [facility]  [phone]

## 2023-11-30 NOTE — Progress Notes (Signed)
Specialty Pharmacy Refill Coordination Note  Todd Parrish is a 42 y.o. male contacted today regarding refills of specialty medication(s) Bictegravir-Emtricitab-Tenofov   Patient requested [name] at [facility] at [place] date: 12/01/23   Medication will be filled on 11/30/23.

## 2023-12-01 DIAGNOSIS — F411 Generalized anxiety disorder: Secondary | ICD-10-CM | POA: Diagnosis not present

## 2023-12-02 DIAGNOSIS — F411 Generalized anxiety disorder: Secondary | ICD-10-CM | POA: Diagnosis not present

## 2023-12-03 ENCOUNTER — Other Ambulatory Visit (HOSPITAL_COMMUNITY): Payer: Self-pay

## 2023-12-08 DIAGNOSIS — F411 Generalized anxiety disorder: Secondary | ICD-10-CM | POA: Diagnosis not present

## 2023-12-09 DIAGNOSIS — F411 Generalized anxiety disorder: Secondary | ICD-10-CM | POA: Diagnosis not present

## 2023-12-16 DIAGNOSIS — F411 Generalized anxiety disorder: Secondary | ICD-10-CM | POA: Diagnosis not present

## 2023-12-19 DIAGNOSIS — F411 Generalized anxiety disorder: Secondary | ICD-10-CM | POA: Diagnosis not present

## 2023-12-20 ENCOUNTER — Other Ambulatory Visit (HOSPITAL_COMMUNITY): Payer: Self-pay

## 2023-12-22 DIAGNOSIS — Z419 Encounter for procedure for purposes other than remedying health state, unspecified: Secondary | ICD-10-CM | POA: Diagnosis not present

## 2023-12-23 ENCOUNTER — Other Ambulatory Visit (HOSPITAL_COMMUNITY): Payer: Self-pay

## 2023-12-24 DIAGNOSIS — F411 Generalized anxiety disorder: Secondary | ICD-10-CM | POA: Diagnosis not present

## 2023-12-26 DIAGNOSIS — F411 Generalized anxiety disorder: Secondary | ICD-10-CM | POA: Diagnosis not present

## 2023-12-27 ENCOUNTER — Other Ambulatory Visit (HOSPITAL_COMMUNITY): Payer: Self-pay

## 2023-12-29 ENCOUNTER — Other Ambulatory Visit (HOSPITAL_COMMUNITY): Payer: Self-pay

## 2023-12-29 ENCOUNTER — Other Ambulatory Visit: Payer: Self-pay

## 2023-12-29 NOTE — Progress Notes (Signed)
 Specialty Pharmacy Refill Coordination Note  Todd Parrish is a 43 y.o. male contacted today regarding refills of specialty medication(s) Bictegravir-Emtricitab-Tenofov (Biktarvy )   Patient requested [name] at [facility] at [place] date: 01/05/24   Medication will be filled on 01/04/24.

## 2023-12-30 DIAGNOSIS — F411 Generalized anxiety disorder: Secondary | ICD-10-CM | POA: Diagnosis not present

## 2023-12-31 ENCOUNTER — Other Ambulatory Visit: Payer: Self-pay

## 2023-12-31 DIAGNOSIS — F411 Generalized anxiety disorder: Secondary | ICD-10-CM | POA: Diagnosis not present

## 2023-12-31 NOTE — Patient Instructions (Signed)
 Visit Information  Mr. Gorr was given information about Medicaid Managed Care team care coordination services as a part of their [facility] Medicaid benefit. Ansar Jermaine Zettel verbally consented to engagement with the [facility] Care team.   If you are experiencing a medical emergency, please call 911 or report to your local emergency department or urgent care.   If you have a non-emergency medical problem during routine business hours, please contact your provider's office and ask to speak with a nurse.   For questions related to your [facility] health plan, please call: [phone] or go here:[email or web address]  If you would like to schedule transportation through your [facility] plan, please call the following number at least 2 days in advance of your appointment: [phone].   You can also use the MTM portal or MTM mobile app to manage your rides. Reimbursement for transportation is available through [facility]! For the portal, please go to [email or web address]  Call the [facility] Crisis Line at [phone], at any time, 24 hours a day, 7 days a week. If you are in danger or need immediate medical attention call 911.  If you would like help to quit smoking, call 1-800-QUIT-NOW (7741746828) OR Espaol: 1-855-Djelo-Ya (8-144-664-6430) o para ms informacin haga clic aqu or Text READY to 799-599 to register via text  Mr. Sabine - following are the goals we discussed in your visit today:   Goals Addressed   None     Social Worker will follow up in 30 days.   Thersia Hoar, HEDWIG, MHA [facility]  Managed Medicaid Social Worker [phone]   Following is a copy of your plan of care:  There are no care plans that you recently modified to display for this patient.

## 2023-12-31 NOTE — Patient Outreach (Signed)
 Medicaid Managed Care Social Work Note  12/31/2023 Name:  Todd Parrish MRN:  [id number] DOB:  1981-02-05  Todd Parrish is an 43 y.o. year old male who is a primary patient of Pcp, No.  The Medicaid Managed Care Coordination team was consulted for assistance with:  Community Resources   Mr. Girgis was given information about Medicaid Managed Care Coordination team services today. Todd Parrish Patient agreed to services and verbal consent obtained.  Engaged with patient  for by telephone forinitial visit in response to referral for case management and/or care coordination services.   Patient is participating in a Managed Medicaid Plan:  Yes  Assessments/Interventions:  Review of past medical history, allergies, medications, health status, including review of consultants reports, laboratory and other test data, was performed as part of comprehensive evaluation and provision of chronic care management services.  SDOH: (Social Drivers of Health) assessments and interventions performed: BSW completed a telephone outreach with patient, he states he did need assistance with a new water heater and the bill got up to $4000. Patient states he has spoken with wellcare about getting assistance. Patient states he does need assistance with his rent, he is behind on it, BSW and patient agreed for resources to be mailed.  Advanced Directives Status:  Not addressed in this encounter.  Care Plan                 Allergies  Allergen Reactions   Morphine  And Codeine Nausea And Vomiting and Other (See Comments)    Elevated blood pressure    Pork-Derived Products Other (See Comments)    No pork for religious reasons - doesn't eat pork No pork for religious reasons   Shellfish Allergy Other (See Comments)    No shrimp for religious reasons -doesn't eat shrimp religious puroses No shrimp for religious reasons    Medications Reviewed Today   Medications were not reviewed in  this encounter     Patient Active Problem List   Diagnosis Date Noted   Cellulitis of right lower extremity 05/22/2022   Cellulitis and abscess of right lower extremity 05/22/2022   Wound of right leg, initial encounter 05/22/2022   Cellulitis, leg 07/22/2020   Edema 07/15/2019   Protein-calorie malnutrition, severe (HCC) 07/05/2019   Anemia of chronic disease 01/25/2017   Personal history of DVT (deep vein thrombosis) 01/25/2017   History of pulmonary embolism 01/25/2017   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/2017   Chronic acquired lymphedema    Essential hypertension    Tibial plateau fracture    Pre-diabetes    Retroperitoneal bleed    PEA (Pulseless electrical activity) (HCC)    Cardiac arrest (HCC) 01/29/2016   Neuropathy 12/23/2015   Benign paroxysmal positional vertigo 12/23/2015   Class 3 severe obesity due to excess calories with serious comorbidity and body mass index (BMI) of 50.0 to 59.9 in adult [facility]) 12/12/2015   Constipation due to opioid therapy 12/03/2015   Seasonal allergies 12/03/2015   Human immunodeficiency virus (HIV) disease (HCC) 11/30/2015   Inguinal hernia    Right medial tibial plateau fracture 11/25/2015    Conditions to be addressed/monitored per PCP order:   community resources  There are no care plans that you recently modified to display for this patient.   Follow up:  Patient agrees to Care Plan and Follow-up.  Plan: The Managed Medicaid care management team will reach out to the patient again over the next 30 days.  Date/time of next scheduled Social  Work care management/care coordination outreach:  02/02/24 Thersia Hoar, HEDWIG, [facility]  Managed [facility] Social Worker [phone]

## 2024-01-04 ENCOUNTER — Other Ambulatory Visit: Payer: Self-pay

## 2024-01-08 DIAGNOSIS — F411 Generalized anxiety disorder: Secondary | ICD-10-CM | POA: Diagnosis not present

## 2024-01-09 DIAGNOSIS — F411 Generalized anxiety disorder: Secondary | ICD-10-CM | POA: Diagnosis not present

## 2024-01-12 DIAGNOSIS — K0889 Other specified disorders of teeth and supporting structures: Secondary | ICD-10-CM | POA: Diagnosis not present

## 2024-01-14 ENCOUNTER — Other Ambulatory Visit (HOSPITAL_COMMUNITY): Payer: Self-pay

## 2024-01-14 ENCOUNTER — Other Ambulatory Visit: Payer: Self-pay

## 2024-01-15 DIAGNOSIS — F411 Generalized anxiety disorder: Secondary | ICD-10-CM | POA: Diagnosis not present

## 2024-01-16 DIAGNOSIS — F411 Generalized anxiety disorder: Secondary | ICD-10-CM | POA: Diagnosis not present

## 2024-01-17 ENCOUNTER — Other Ambulatory Visit: Payer: Self-pay

## 2024-01-18 ENCOUNTER — Other Ambulatory Visit: Payer: Self-pay

## 2024-01-22 DIAGNOSIS — Z419 Encounter for procedure for purposes other than remedying health state, unspecified: Secondary | ICD-10-CM | POA: Diagnosis not present

## 2024-01-22 DIAGNOSIS — F411 Generalized anxiety disorder: Secondary | ICD-10-CM | POA: Diagnosis not present

## 2024-01-23 DIAGNOSIS — F411 Generalized anxiety disorder: Secondary | ICD-10-CM | POA: Diagnosis not present

## 2024-01-27 DIAGNOSIS — F411 Generalized anxiety disorder: Secondary | ICD-10-CM | POA: Diagnosis not present

## 2024-01-28 DIAGNOSIS — F411 Generalized anxiety disorder: Secondary | ICD-10-CM | POA: Diagnosis not present

## 2024-01-28 DIAGNOSIS — L089 Local infection of the skin and subcutaneous tissue, unspecified: Secondary | ICD-10-CM | POA: Diagnosis not present

## 2024-01-28 DIAGNOSIS — K047 Periapical abscess without sinus: Secondary | ICD-10-CM | POA: Diagnosis not present

## 2024-02-02 ENCOUNTER — Other Ambulatory Visit: Payer: Self-pay

## 2024-02-02 NOTE — Patient Instructions (Signed)
Visit Information  Mr. Fuertes was given information about Medicaid Managed Care team care coordination services as a part of their [facility] Medicaid benefit. Annabell Howells Bankson verbally consented to engagement with the [facility] Care team.   If you are experiencing a medical emergency, please call 911 or report to your local emergency department or urgent care.   If you have a non-emergency medical problem during routine business hours, please contact your provider's office and ask to speak with a nurse.   For questions related to your [facility] health plan, please call: [phone] or go here:[email or web address]  If you would like to schedule transportation through your [facility] plan, please call the following number at least 2 days in advance of your appointment: [phone].   You can also use the MTM portal or MTM mobile app to manage your rides. Reimbursement for transportation is available through [facility]! For the portal, please go to [email or web address]  Call the [facility] Crisis Line at [phone], at any time, 24 hours a day, 7 days a week. If you are in danger or need immediate medical attention call 911.  If you would like help to quit smoking, call 1-800-QUIT-NOW ((930)315-0314) OR Espaol: 1-855-Djelo-Ya (3-474-259-5638) o para ms informacin haga clic aqu or Text READY to 756-433 to register via text  Mr. Barkey - following are the goals we discussed in your visit today:   Goals Addressed   None       The  Patient                                              has been provided with contact information for the Managed Medicaid care management team and has been advised to call with any health related questions or concerns.   Gus Puma, Kenard Gower, MHA [facility] Health  Managed Medicaid Social Worker [phone]   Following is a copy of your plan of care:  There are no care plans that you recently modified to display for  this patient.
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
## 2024-02-03 ENCOUNTER — Other Ambulatory Visit: Payer: Self-pay

## 2024-02-05 DIAGNOSIS — F411 Generalized anxiety disorder: Secondary | ICD-10-CM | POA: Diagnosis not present

## 2024-02-06 DIAGNOSIS — F411 Generalized anxiety disorder: Secondary | ICD-10-CM | POA: Diagnosis not present

## 2024-02-08 ENCOUNTER — Other Ambulatory Visit: Payer: Self-pay

## 2024-02-08 NOTE — Progress Notes (Signed)
 Specialty Pharmacy Refill Coordination Note  Todd Parrish is a 43 y.o. male contacted today regarding refills of specialty medication(s) Bictegravir-Emtricitab-Tenofov [facility])   Patient requested Todd Parrish at [facility] at [place] date: 02/15/24   Medication will be filled on 02/14/24.

## 2024-02-08 NOTE — Progress Notes (Signed)
 Specialty Pharmacy Ongoing Clinical Assessment Note  Todd Parrish is a 43 y.o. male who is being followed by the specialty pharmacy service for RxSp HIV   Patient's specialty medication(s) reviewed today: Bictegravir-Emtricitab-Tenofov (Biktarvy)   Missed doses in the last 4 weeks: 0   Patient/Caregiver did not have any additional questions or concerns.   Therapeutic benefit summary: Patient is achieving benefit   Adverse events/side effects summary: No adverse events/side effects   Patient's therapy is appropriate to: Continue    Goals Addressed             This Visit's Progress    Achieve Undetectable HIV Viral Load < 20       Patient is on track. Patient will maintain adherence. Viral load remains undetectable long term.          Follow up:  6 months  Otto Herb Specialty Pharmacist

## 2024-02-10 DIAGNOSIS — F411 Generalized anxiety disorder: Secondary | ICD-10-CM | POA: Diagnosis not present

## 2024-02-13 DIAGNOSIS — F411 Generalized anxiety disorder: Secondary | ICD-10-CM | POA: Diagnosis not present

## 2024-02-14 ENCOUNTER — Other Ambulatory Visit: Payer: Self-pay

## 2024-02-14 ENCOUNTER — Encounter (HOSPITAL_BASED_OUTPATIENT_CLINIC_OR_DEPARTMENT_OTHER): Payer: Self-pay

## 2024-02-14 DIAGNOSIS — Z21 Asymptomatic human immunodeficiency virus [HIV] infection status: Secondary | ICD-10-CM | POA: Diagnosis not present

## 2024-02-14 DIAGNOSIS — L03116 Cellulitis of left lower limb: Secondary | ICD-10-CM | POA: Insufficient documentation

## 2024-02-14 DIAGNOSIS — M79662 Pain in left lower leg: Secondary | ICD-10-CM | POA: Diagnosis present

## 2024-02-14 LAB — CBC WITH DIFFERENTIAL/PLATELET
Abs Immature Granulocytes: 0.03 10*3/uL (ref 0.00–0.07)
Basophils Absolute: 0 10*3/uL (ref 0.0–0.1)
Basophils Relative: 0 %
Eosinophils Absolute: 0.2 10*3/uL (ref 0.0–0.5)
Eosinophils Relative: 3 %
HCT: 36.6 % — ABNORMAL LOW (ref 39.0–52.0)
Hemoglobin: 10.9 g/dL — ABNORMAL LOW (ref 13.0–17.0)
Immature Granulocytes: 0 %
Lymphocytes Relative: 30 %
Lymphs Abs: 2.2 10*3/uL (ref 0.7–4.0)
MCH: 24.6 pg — ABNORMAL LOW (ref 26.0–34.0)
MCHC: 29.8 g/dL — ABNORMAL LOW (ref 30.0–36.0)
MCV: 82.6 fL (ref 80.0–100.0)
Monocytes Absolute: 0.7 10*3/uL (ref 0.1–1.0)
Monocytes Relative: 9 %
Neutro Abs: 4.2 10*3/uL (ref 1.7–7.7)
Neutrophils Relative %: 58 %
Platelets: 270 10*3/uL (ref 150–400)
RBC: 4.43 MIL/uL (ref 4.22–5.81)
RDW: 13.9 % (ref 11.5–15.5)
WBC: 7.3 10*3/uL (ref 4.0–10.5)
nRBC: 0 % (ref 0.0–0.2)

## 2024-02-14 LAB — BASIC METABOLIC PANEL
Anion gap: 7 (ref 5–15)
BUN: 12 mg/dL (ref 6–20)
CO2: 28 mmol/L (ref 22–32)
Calcium: 9.1 mg/dL (ref 8.9–10.3)
Chloride: 102 mmol/L (ref 98–111)
Creatinine, Ser: 1.01 mg/dL (ref 0.61–1.24)
GFR, Estimated: >60 mL/min (ref >60)
Glucose, Bld: 85 mg/dL (ref 70–99)
Potassium: 3.7 mmol/L (ref 3.5–5.1)
Sodium: 137 mmol/L (ref 135–145)

## 2024-02-14 NOTE — ED Triage Notes (Signed)
 Pt presents via POV c/o left leg pain. Reports possible cellulitis. Reports drainage to back of left calf. Reports hx lymphedema. Ambulatory to triage. Also reports hx of DVT but states this feels like cellulitis. Reports has previous hx of cellulitis.

## 2024-02-15 ENCOUNTER — Emergency Department (HOSPITAL_BASED_OUTPATIENT_CLINIC_OR_DEPARTMENT_OTHER)
Admission: EM | Admit: 2024-02-15 | Discharge: 2024-02-15 | Disposition: A | Discharge status: Home / Self Care | Payer: Medicaid Other | Attending: Emergency Medicine | Admitting: Emergency Medicine

## 2024-02-15 ENCOUNTER — Encounter (HOSPITAL_BASED_OUTPATIENT_CLINIC_OR_DEPARTMENT_OTHER): Payer: Self-pay | Admitting: Emergency Medicine

## 2024-02-15 ENCOUNTER — Other Ambulatory Visit (HOSPITAL_BASED_OUTPATIENT_CLINIC_OR_DEPARTMENT_OTHER): Payer: Self-pay

## 2024-02-15 DIAGNOSIS — L0390 Cellulitis, unspecified: Secondary | ICD-10-CM

## 2024-02-15 MED ORDER — NAPROXEN 250 MG PO TABS
500.0000 mg | ORAL_TABLET | Freq: Once | ORAL | Status: AC
  Administered 2024-02-15: 500 mg via ORAL
  Filled 2024-02-15: qty 2

## 2024-02-15 MED ORDER — DOXYCYCLINE HYCLATE 100 MG PO CAPS: 100.0000 mg | ORAL_CAPSULE | Freq: Two times a day (BID) | ORAL | 0 refills | Status: AC

## 2024-02-15 MED ORDER — DOXYCYCLINE HYCLATE 100 MG PO CAPS
100.0000 mg | ORAL_CAPSULE | Freq: Two times a day (BID) | ORAL | 0 refills | Status: DC
  Filled 2024-02-15: qty 20, 10d supply, fill #0

## 2024-02-15 MED ORDER — DOXYCYCLINE HYCLATE 100 MG PO TABS
100.0000 mg | ORAL_TABLET | Freq: Once | ORAL | Status: AC
  Administered 2024-02-15: 100 mg via ORAL
  Filled 2024-02-15: qty 1

## 2024-02-15 NOTE — ED Notes (Signed)
 ED Provider at bedside.
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
## 2024-02-15 NOTE — Discharge Instructions (Signed)
 You were evaluated in the Emergency Department and after careful evaluation, we did not find any emergent condition requiring admission or further testing in the hospital.  Your exam/testing today is overall reassuring.  Symptoms may be due to a cellulitis.  Recommend taking the doxycycline antibiotic as directed to see if this clears the infection.  If not improving over the next few days would recommend reassessment by healthcare provider.  Please return to the Emergency Department if you experience any worsening of your condition.   Thank you for allowing [place] to be a part of your care.

## 2024-02-18 DIAGNOSIS — F411 Generalized anxiety disorder: Secondary | ICD-10-CM | POA: Diagnosis not present

## 2024-02-19 DIAGNOSIS — Z419 Encounter for procedure for purposes other than remedying health state, unspecified: Secondary | ICD-10-CM | POA: Diagnosis not present

## 2024-02-20 DIAGNOSIS — F411 Generalized anxiety disorder: Secondary | ICD-10-CM | POA: Diagnosis not present

## 2024-02-22 ENCOUNTER — Ambulatory Visit: Payer: Medicaid Other | Admitting: Internal Medicine

## 2024-02-26 DIAGNOSIS — F411 Generalized anxiety disorder: Secondary | ICD-10-CM | POA: Diagnosis not present

## 2024-02-27 DIAGNOSIS — F411 Generalized anxiety disorder: Secondary | ICD-10-CM | POA: Diagnosis not present

## 2024-02-28 ENCOUNTER — Other Ambulatory Visit (HOSPITAL_COMMUNITY): Payer: Self-pay

## 2024-03-03 ENCOUNTER — Other Ambulatory Visit: Payer: Self-pay

## 2024-03-04 DIAGNOSIS — F411 Generalized anxiety disorder: Secondary | ICD-10-CM | POA: Diagnosis not present

## 2024-03-05 DIAGNOSIS — F411 Generalized anxiety disorder: Secondary | ICD-10-CM | POA: Diagnosis not present

## 2024-03-06 ENCOUNTER — Other Ambulatory Visit: Payer: Self-pay

## 2024-03-07 ENCOUNTER — Other Ambulatory Visit (HOSPITAL_COMMUNITY): Payer: Self-pay

## 2024-03-11 DIAGNOSIS — F411 Generalized anxiety disorder: Secondary | ICD-10-CM | POA: Diagnosis not present

## 2024-03-12 DIAGNOSIS — F411 Generalized anxiety disorder: Secondary | ICD-10-CM | POA: Diagnosis not present

## 2024-03-17 ENCOUNTER — Other Ambulatory Visit: Payer: Self-pay

## 2024-03-17 ENCOUNTER — Other Ambulatory Visit: Payer: Self-pay | Admitting: Pharmacy Technician

## 2024-03-17 ENCOUNTER — Other Ambulatory Visit (HOSPITAL_COMMUNITY): Payer: Self-pay

## 2024-03-17 DIAGNOSIS — F411 Generalized anxiety disorder: Secondary | ICD-10-CM | POA: Diagnosis not present

## 2024-03-17 NOTE — Progress Notes (Signed)
 Specialty Pharmacy Refill Coordination Note  Todd Parrish is a 43 y.o. male contacted today regarding refills of specialty medication(s) Bictegravir-Emtricitab-Tenofov Musician)   Patient requested Todd Parrish at [facility] at [place] date: 03/17/24   Medication will be filled on 03/17/24.

## 2024-03-19 DIAGNOSIS — F411 Generalized anxiety disorder: Secondary | ICD-10-CM | POA: Diagnosis not present

## 2024-03-21 ENCOUNTER — Ambulatory Visit: Payer: Medicaid Other | Admitting: Internal Medicine

## 2024-03-23 ENCOUNTER — Other Ambulatory Visit: Payer: Self-pay

## 2024-03-23 ENCOUNTER — Other Ambulatory Visit (HOSPITAL_COMMUNITY): Payer: Self-pay

## 2024-03-23 NOTE — Progress Notes (Signed)
 Spoke to patient to pick up medication at [facility] by  03/24/24 before 6 pm, if not medication will be returned to stock. Patient stated he have 2 pills left and will come pick up today.

## 2024-03-24 ENCOUNTER — Other Ambulatory Visit (HOSPITAL_COMMUNITY): Payer: Self-pay

## 2024-03-27 ENCOUNTER — Other Ambulatory Visit (HOSPITAL_COMMUNITY): Payer: Self-pay

## 2024-03-27 NOTE — Progress Notes (Signed)
 PPU 3/28; Spoke with patient will pick up PPU 4/3; Spoke with patient will pick up-if not today 4/5 will be the last day until RTS.  RTS on 04/07. Next refill call 04/13/24.

## 2024-03-28 ENCOUNTER — Other Ambulatory Visit (HOSPITAL_COMMUNITY): Payer: Self-pay

## 2024-03-28 DIAGNOSIS — F411 Generalized anxiety disorder: Secondary | ICD-10-CM | POA: Diagnosis not present

## 2024-03-29 ENCOUNTER — Other Ambulatory Visit: Payer: Self-pay | Admitting: Pharmacy Technician

## 2024-03-29 ENCOUNTER — Other Ambulatory Visit: Payer: Self-pay

## 2024-03-29 DIAGNOSIS — F411 Generalized anxiety disorder: Secondary | ICD-10-CM | POA: Diagnosis not present

## 2024-03-29 NOTE — Progress Notes (Signed)
 Specialty Pharmacy Refill Coordination Note  Todd Parrish is a 43 y.o. male contacted today regarding refills of specialty medication(s) Bictegravir-Emtricitab-Tenofov Musician)   Patient requested Todd Parrish at [facility] at [place] date: 03/30/24   Medication will be filled on 03/29/24.

## 2024-03-30 ENCOUNTER — Other Ambulatory Visit: Payer: Self-pay

## 2024-04-01 DIAGNOSIS — F411 Generalized anxiety disorder: Secondary | ICD-10-CM | POA: Diagnosis not present

## 2024-04-01 DIAGNOSIS — Z419 Encounter for procedure for purposes other than remedying health state, unspecified: Secondary | ICD-10-CM | POA: Diagnosis not present

## 2024-04-03 DIAGNOSIS — F411 Generalized anxiety disorder: Secondary | ICD-10-CM | POA: Diagnosis not present

## 2024-04-09 DIAGNOSIS — F411 Generalized anxiety disorder: Secondary | ICD-10-CM | POA: Diagnosis not present

## 2024-04-10 DIAGNOSIS — F411 Generalized anxiety disorder: Secondary | ICD-10-CM | POA: Diagnosis not present

## 2024-04-13 DIAGNOSIS — F411 Generalized anxiety disorder: Secondary | ICD-10-CM | POA: Diagnosis not present

## 2024-04-18 ENCOUNTER — Other Ambulatory Visit (HOSPITAL_COMMUNITY): Payer: Self-pay

## 2024-04-18 DIAGNOSIS — F411 Generalized anxiety disorder: Secondary | ICD-10-CM | POA: Diagnosis not present

## 2024-04-21 DIAGNOSIS — F411 Generalized anxiety disorder: Secondary | ICD-10-CM | POA: Diagnosis not present

## 2024-04-24 ENCOUNTER — Other Ambulatory Visit (HOSPITAL_COMMUNITY): Payer: Self-pay

## 2024-04-26 DIAGNOSIS — F411 Generalized anxiety disorder: Secondary | ICD-10-CM | POA: Diagnosis not present

## 2024-04-28 ENCOUNTER — Other Ambulatory Visit: Payer: Self-pay

## 2024-04-28 DIAGNOSIS — F411 Generalized anxiety disorder: Secondary | ICD-10-CM | POA: Diagnosis not present

## 2024-05-01 DIAGNOSIS — F411 Generalized anxiety disorder: Secondary | ICD-10-CM | POA: Diagnosis not present

## 2024-05-01 DIAGNOSIS — Z419 Encounter for procedure for purposes other than remedying health state, unspecified: Secondary | ICD-10-CM | POA: Diagnosis not present

## 2024-05-01 NOTE — Progress Notes (Signed)
 The ASCVD Risk score (Arnett DK, et al., 2019) failed to calculate for the following reasons:   The valid total cholesterol range is 130 to 320 mg/dL  Arlon Bergamo, BSN, RN

## 2024-05-02 ENCOUNTER — Other Ambulatory Visit: Payer: Self-pay

## 2024-05-02 NOTE — Progress Notes (Signed)
 Specialty Pharmacy Refill Coordination Note  Todd Parrish is a 43 y.o. male contacted today regarding refills of specialty medication(s) Bictegravir-Emtricitab-Tenofov (Biktarvy )   Patient requested [name] at [facility] at [place] date: 05/05/24   Medication will be filled on 05.15.25.

## 2024-05-04 DIAGNOSIS — F411 Generalized anxiety disorder: Secondary | ICD-10-CM | POA: Diagnosis not present

## 2024-05-09 DIAGNOSIS — F411 Generalized anxiety disorder: Secondary | ICD-10-CM | POA: Diagnosis not present

## 2024-05-11 DIAGNOSIS — F411 Generalized anxiety disorder: Secondary | ICD-10-CM | POA: Diagnosis not present

## 2024-05-17 ENCOUNTER — Other Ambulatory Visit (HOSPITAL_COMMUNITY): Payer: Self-pay

## 2024-05-17 DIAGNOSIS — F411 Generalized anxiety disorder: Secondary | ICD-10-CM | POA: Diagnosis not present

## 2024-05-17 NOTE — Progress Notes (Signed)
(  05/17/24 CA): LVM. Never Picked Up. New encounter needed.

## 2024-05-18 ENCOUNTER — Other Ambulatory Visit (HOSPITAL_COMMUNITY): Payer: Self-pay

## 2024-05-20 DIAGNOSIS — F411 Generalized anxiety disorder: Secondary | ICD-10-CM | POA: Diagnosis not present

## 2024-05-24 DIAGNOSIS — F411 Generalized anxiety disorder: Secondary | ICD-10-CM | POA: Diagnosis not present

## 2024-05-25 DIAGNOSIS — F411 Generalized anxiety disorder: Secondary | ICD-10-CM | POA: Diagnosis not present

## 2024-05-31 ENCOUNTER — Other Ambulatory Visit: Payer: Self-pay

## 2024-05-31 ENCOUNTER — Other Ambulatory Visit (HOSPITAL_COMMUNITY): Payer: Self-pay

## 2024-05-31 DIAGNOSIS — F411 Generalized anxiety disorder: Secondary | ICD-10-CM | POA: Diagnosis not present

## 2024-05-31 NOTE — Progress Notes (Signed)
 Specialty Pharmacy Refill Coordination Note  Todd Parrish is a 43 y.o. male contacted today regarding refills of specialty medication(s) Bictegravir-Emtricitab-Tenofov (Biktarvy )   Patient requested [name] at [facility] at [place] date: 05/31/24   Medication will be filled on 06.11.25.

## 2024-06-01 DIAGNOSIS — Z419 Encounter for procedure for purposes other than remedying health state, unspecified: Secondary | ICD-10-CM | POA: Diagnosis not present

## 2024-06-02 DIAGNOSIS — F411 Generalized anxiety disorder: Secondary | ICD-10-CM | POA: Diagnosis not present

## 2024-06-08 DIAGNOSIS — F411 Generalized anxiety disorder: Secondary | ICD-10-CM | POA: Diagnosis not present

## 2024-06-10 DIAGNOSIS — F411 Generalized anxiety disorder: Secondary | ICD-10-CM | POA: Diagnosis not present

## 2024-06-14 DIAGNOSIS — F411 Generalized anxiety disorder: Secondary | ICD-10-CM | POA: Diagnosis not present

## 2024-06-17 DIAGNOSIS — F411 Generalized anxiety disorder: Secondary | ICD-10-CM | POA: Diagnosis not present

## 2024-06-21 ENCOUNTER — Other Ambulatory Visit: Payer: Self-pay

## 2024-06-21 DIAGNOSIS — F411 Generalized anxiety disorder: Secondary | ICD-10-CM | POA: Diagnosis not present

## 2024-06-22 ENCOUNTER — Other Ambulatory Visit: Payer: Self-pay

## 2024-06-22 NOTE — Progress Notes (Signed)
 Specialty Pharmacy Refill Coordination Note  Todd Parrish is a 43 y.o. male contacted today regarding refills of specialty medication(s) Bictegravir-Emtricitab-Tenofov (Biktarvy )   Patient requested [name] at [facility] at [place]   Delivery date: No data recorded  Verified address: No data recorded  Medication will be filled on 07.08.25.

## 2024-06-24 DIAGNOSIS — F411 Generalized anxiety disorder: Secondary | ICD-10-CM | POA: Diagnosis not present

## 2024-06-28 DIAGNOSIS — F411 Generalized anxiety disorder: Secondary | ICD-10-CM | POA: Diagnosis not present

## 2024-06-30 DIAGNOSIS — F411 Generalized anxiety disorder: Secondary | ICD-10-CM | POA: Diagnosis not present

## 2024-07-01 DIAGNOSIS — Z419 Encounter for procedure for purposes other than remedying health state, unspecified: Secondary | ICD-10-CM | POA: Diagnosis not present

## 2024-07-05 ENCOUNTER — Other Ambulatory Visit (HOSPITAL_COMMUNITY): Payer: Self-pay

## 2024-07-05 DIAGNOSIS — F411 Generalized anxiety disorder: Secondary | ICD-10-CM | POA: Diagnosis not present

## 2024-07-05 NOTE — Progress Notes (Signed)
 Returned to [employer]. Attempted to reach patient on 7.16.25. VM is full. Patient never picked up, and their next refill call is on 07/21/24.

## 2024-07-06 ENCOUNTER — Other Ambulatory Visit: Payer: Self-pay

## 2024-07-07 DIAGNOSIS — F411 Generalized anxiety disorder: Secondary | ICD-10-CM | POA: Diagnosis not present

## 2024-07-12 DIAGNOSIS — F411 Generalized anxiety disorder: Secondary | ICD-10-CM | POA: Diagnosis not present

## 2024-07-14 DIAGNOSIS — F411 Generalized anxiety disorder: Secondary | ICD-10-CM | POA: Diagnosis not present

## 2024-07-16 DIAGNOSIS — F411 Generalized anxiety disorder: Secondary | ICD-10-CM | POA: Diagnosis not present

## 2024-07-19 ENCOUNTER — Other Ambulatory Visit (HOSPITAL_COMMUNITY): Payer: Self-pay

## 2024-07-19 ENCOUNTER — Other Ambulatory Visit: Payer: Self-pay

## 2024-07-19 NOTE — Progress Notes (Signed)
 Patient called back and will pick up today. Queued again for same day fill at [facility].

## 2024-07-25 DIAGNOSIS — F411 Generalized anxiety disorder: Secondary | ICD-10-CM | POA: Diagnosis not present

## 2024-07-27 DIAGNOSIS — F411 Generalized anxiety disorder: Secondary | ICD-10-CM | POA: Diagnosis not present

## 2024-07-31 ENCOUNTER — Other Ambulatory Visit: Payer: Self-pay

## 2024-08-01 DIAGNOSIS — Z419 Encounter for procedure for purposes other than remedying health state, unspecified: Secondary | ICD-10-CM | POA: Diagnosis not present

## 2024-08-03 DIAGNOSIS — F411 Generalized anxiety disorder: Secondary | ICD-10-CM | POA: Diagnosis not present

## 2024-08-05 DIAGNOSIS — F411 Generalized anxiety disorder: Secondary | ICD-10-CM | POA: Diagnosis not present

## 2024-08-10 DIAGNOSIS — F411 Generalized anxiety disorder: Secondary | ICD-10-CM | POA: Diagnosis not present

## 2024-08-11 ENCOUNTER — Other Ambulatory Visit (HOSPITAL_COMMUNITY): Payer: Self-pay

## 2024-08-12 DIAGNOSIS — F411 Generalized anxiety disorder: Secondary | ICD-10-CM | POA: Diagnosis not present

## 2024-08-15 ENCOUNTER — Other Ambulatory Visit: Payer: Self-pay

## 2024-08-15 NOTE — Progress Notes (Signed)
 Called patient regarding pick up date being a holiday, but he did not answer and VM was full. Will set new pick up date for 08/22/24.

## 2024-08-15 NOTE — Progress Notes (Signed)
 Specialty Pharmacy Refill Coordination Note  Todd Parrish is a 43 y.o. male contacted today regarding refills of specialty medication(s) Bictegravir-Emtricitab-Tenofov (Biktarvy )   Patient requested [name] at [facility] at [place] date: 08/21/24   Medication will be filled on 08/18/24.

## 2024-08-17 ENCOUNTER — Other Ambulatory Visit: Payer: Self-pay

## 2024-08-18 DIAGNOSIS — F411 Generalized anxiety disorder: Secondary | ICD-10-CM | POA: Diagnosis not present

## 2024-08-19 DIAGNOSIS — F411 Generalized anxiety disorder: Secondary | ICD-10-CM | POA: Diagnosis not present

## 2024-08-23 DIAGNOSIS — F411 Generalized anxiety disorder: Secondary | ICD-10-CM | POA: Diagnosis not present

## 2024-08-24 ENCOUNTER — Other Ambulatory Visit (HOSPITAL_COMMUNITY): Payer: Self-pay

## 2024-08-25 ENCOUNTER — Other Ambulatory Visit: Payer: Self-pay

## 2024-08-26 DIAGNOSIS — F411 Generalized anxiety disorder: Secondary | ICD-10-CM | POA: Diagnosis not present

## 2024-08-28 ENCOUNTER — Other Ambulatory Visit (HOSPITAL_COMMUNITY): Payer: Self-pay

## 2024-08-29 ENCOUNTER — Other Ambulatory Visit: Payer: Self-pay

## 2024-08-31 DIAGNOSIS — F411 Generalized anxiety disorder: Secondary | ICD-10-CM | POA: Diagnosis not present

## 2024-09-01 ENCOUNTER — Other Ambulatory Visit: Payer: Self-pay

## 2024-09-01 ENCOUNTER — Other Ambulatory Visit (HOSPITAL_COMMUNITY): Payer: Self-pay

## 2024-09-01 DIAGNOSIS — Z419 Encounter for procedure for purposes other than remedying health state, unspecified: Secondary | ICD-10-CM | POA: Diagnosis not present

## 2024-09-02 DIAGNOSIS — F411 Generalized anxiety disorder: Secondary | ICD-10-CM | POA: Diagnosis not present

## 2024-09-07 DIAGNOSIS — F411 Generalized anxiety disorder: Secondary | ICD-10-CM | POA: Diagnosis not present

## 2024-09-09 DIAGNOSIS — F411 Generalized anxiety disorder: Secondary | ICD-10-CM | POA: Diagnosis not present

## 2024-09-13 DIAGNOSIS — F411 Generalized anxiety disorder: Secondary | ICD-10-CM | POA: Diagnosis not present

## 2024-09-15 DIAGNOSIS — F411 Generalized anxiety disorder: Secondary | ICD-10-CM | POA: Diagnosis not present

## 2024-09-20 DIAGNOSIS — F411 Generalized anxiety disorder: Secondary | ICD-10-CM | POA: Diagnosis not present

## 2024-09-21 ENCOUNTER — Other Ambulatory Visit (HOSPITAL_COMMUNITY): Payer: Self-pay

## 2024-09-21 ENCOUNTER — Other Ambulatory Visit: Payer: Self-pay

## 2024-09-21 ENCOUNTER — Other Ambulatory Visit: Payer: Self-pay | Admitting: Internal Medicine

## 2024-09-21 DIAGNOSIS — B20 Human immunodeficiency virus [HIV] disease: Secondary | ICD-10-CM

## 2024-09-21 MED ORDER — BIKTARVY 50-200-25 MG PO TABS
1.0000 | ORAL_TABLET | Freq: Every day | ORAL | 0 refills | Status: DC
  Filled 2024-09-21 – 2024-10-24 (×3): qty 30, 30d supply, fill #0

## 2024-09-21 NOTE — Progress Notes (Signed)
 Specialty Pharmacy Refill Coordination Note  Todd Parrish is a 43 y.o. male contacted today regarding refills of specialty medication(s) Bictegravir-Emtricitab-Tenofov (Biktarvy )   Patient requested [name] at [facility] at [place] date: 09/26/24   Medication will be filled on 09/25/24.   This fill date is pending response to refill request from provider. Patient is aware and if they have not received fill by intended date they must follow up with pharmacy. Patient was advised to call office for appointment.

## 2024-09-22 DIAGNOSIS — F411 Generalized anxiety disorder: Secondary | ICD-10-CM | POA: Diagnosis not present

## 2024-09-28 DIAGNOSIS — F411 Generalized anxiety disorder: Secondary | ICD-10-CM | POA: Diagnosis not present

## 2024-09-30 DIAGNOSIS — F411 Generalized anxiety disorder: Secondary | ICD-10-CM | POA: Diagnosis not present

## 2024-10-05 DIAGNOSIS — F411 Generalized anxiety disorder: Secondary | ICD-10-CM | POA: Diagnosis not present

## 2024-10-07 ENCOUNTER — Other Ambulatory Visit (HOSPITAL_COMMUNITY): Payer: Self-pay

## 2024-10-07 DIAGNOSIS — F411 Generalized anxiety disorder: Secondary | ICD-10-CM | POA: Diagnosis not present

## 2024-10-08 ENCOUNTER — Inpatient Hospital Stay (HOSPITAL_COMMUNITY)
Admission: EM | Admit: 2024-10-08 | Discharge: 2024-10-12 | DRG: 975 | Disposition: A | Discharge status: Home / Self Care | Attending: Internal Medicine | Admitting: Internal Medicine

## 2024-10-08 ENCOUNTER — Encounter (HOSPITAL_COMMUNITY): Payer: Self-pay

## 2024-10-08 ENCOUNTER — Emergency Department (HOSPITAL_COMMUNITY)

## 2024-10-08 ENCOUNTER — Other Ambulatory Visit: Payer: Self-pay

## 2024-10-08 DIAGNOSIS — R609 Edema, unspecified: Secondary | ICD-10-CM | POA: Diagnosis not present

## 2024-10-08 DIAGNOSIS — D638 Anemia in other chronic diseases classified elsewhere: Secondary | ICD-10-CM | POA: Diagnosis not present

## 2024-10-08 DIAGNOSIS — Z86711 Personal history of pulmonary embolism: Secondary | ICD-10-CM | POA: Diagnosis not present

## 2024-10-08 DIAGNOSIS — R000 Tachycardia, unspecified: Secondary | ICD-10-CM | POA: Diagnosis not present

## 2024-10-08 DIAGNOSIS — I10 Essential (primary) hypertension: Secondary | ICD-10-CM | POA: Diagnosis not present

## 2024-10-08 DIAGNOSIS — Z86718 Personal history of other venous thrombosis and embolism: Secondary | ICD-10-CM | POA: Diagnosis not present

## 2024-10-08 DIAGNOSIS — L0390 Cellulitis, unspecified: Secondary | ICD-10-CM | POA: Diagnosis not present

## 2024-10-08 DIAGNOSIS — B20 Human immunodeficiency virus [HIV] disease: Secondary | ICD-10-CM | POA: Diagnosis not present

## 2024-10-08 DIAGNOSIS — L03115 Cellulitis of right lower limb: Secondary | ICD-10-CM | POA: Diagnosis not present

## 2024-10-08 DIAGNOSIS — A419 Sepsis, unspecified organism: Principal | ICD-10-CM | POA: Diagnosis present

## 2024-10-08 DIAGNOSIS — Z743 Need for continuous supervision: Secondary | ICD-10-CM | POA: Diagnosis not present

## 2024-10-08 DIAGNOSIS — K4090 Unilateral inguinal hernia, without obstruction or gangrene, not specified as recurrent: Secondary | ICD-10-CM | POA: Diagnosis present

## 2024-10-08 DIAGNOSIS — H8110 Benign paroxysmal vertigo, unspecified ear: Secondary | ICD-10-CM | POA: Diagnosis present

## 2024-10-08 DIAGNOSIS — E66813 Obesity, class 3: Secondary | ICD-10-CM | POA: Diagnosis present

## 2024-10-08 DIAGNOSIS — G8911 Acute pain due to trauma: Secondary | ICD-10-CM | POA: Diagnosis not present

## 2024-10-08 DIAGNOSIS — E872 Acidosis, unspecified: Secondary | ICD-10-CM | POA: Diagnosis not present

## 2024-10-08 DIAGNOSIS — R509 Fever, unspecified: Secondary | ICD-10-CM | POA: Diagnosis not present

## 2024-10-08 DIAGNOSIS — M79661 Pain in right lower leg: Secondary | ICD-10-CM | POA: Diagnosis not present

## 2024-10-08 DIAGNOSIS — R69 Illness, unspecified: Secondary | ICD-10-CM

## 2024-10-08 DIAGNOSIS — Z6841 Body Mass Index (BMI) 40.0 and over, adult: Secondary | ICD-10-CM | POA: Diagnosis not present

## 2024-10-08 DIAGNOSIS — I890 Lymphedema, not elsewhere classified: Secondary | ICD-10-CM | POA: Diagnosis present

## 2024-10-08 DIAGNOSIS — Z79899 Other long term (current) drug therapy: Secondary | ICD-10-CM

## 2024-10-08 LAB — COMPREHENSIVE METABOLIC PANEL WITH GFR
ALT: 8 U/L (ref 0–44)
AST: 21 U/L (ref 15–41)
Albumin: 3.4 g/dL — ABNORMAL LOW (ref 3.5–5.0)
Alkaline Phosphatase: 63 U/L (ref 38–126)
Anion gap: 13 (ref 5–15)
BUN: 14 mg/dL (ref 6–20)
CO2: 22 mmol/L (ref 22–32)
Calcium: 8.8 mg/dL — ABNORMAL LOW (ref 8.9–10.3)
Chloride: 101 mmol/L (ref 98–111)
Creatinine, Ser: 1.32 mg/dL — ABNORMAL HIGH (ref 0.61–1.24)
GFR, Estimated: >60 mL/min (ref >60)
Glucose, Bld: 110 mg/dL — ABNORMAL HIGH (ref 70–99)
Potassium: 3.7 mmol/L (ref 3.5–5.1)
Sodium: 135 mmol/L (ref 135–145)
Total Bilirubin: 0.6 mg/dL (ref 0.0–1.2)
Total Protein: 7.8 g/dL (ref 6.5–8.1)

## 2024-10-08 LAB — CBC
HCT: 33.9 % — ABNORMAL LOW (ref 39.0–52.0)
Hemoglobin: 9.8 g/dL — ABNORMAL LOW (ref 13.0–17.0)
MCH: 23.8 pg — ABNORMAL LOW (ref 26.0–34.0)
MCHC: 28.9 g/dL — ABNORMAL LOW (ref 30.0–36.0)
MCV: 82.3 fL (ref 80.0–100.0)
Platelets: 260 K/uL (ref 150–400)
RBC: 4.12 MIL/uL — ABNORMAL LOW (ref 4.22–5.81)
RDW: 13.8 % (ref 11.5–15.5)
WBC: 13.2 K/uL — ABNORMAL HIGH (ref 4.0–10.5)
nRBC: 0 % (ref 0.0–0.2)

## 2024-10-08 LAB — I-STAT CG4 LACTIC ACID, ED
Lactic Acid, Venous: 4.1 mmol/L (ref 0.5–1.9)
Lactic Acid, Venous: 3 mmol/L (ref 0.5–1.9)

## 2024-10-08 MED ORDER — VANCOMYCIN HCL 10 G IV SOLR: 2250.0000 mg | INTRAVENOUS | Status: DC

## 2024-10-08 MED ORDER — ONDANSETRON HCL 4 MG PO TABS: 4.0000 mg | ORAL_TABLET | Freq: Four times a day (QID) | ORAL | Status: DC | PRN

## 2024-10-08 MED ORDER — RIVAROXABAN 10 MG PO TABS
10.0000 mg | ORAL_TABLET | Freq: Every day | ORAL | Status: DC
  Administered 2024-10-09 – 2024-10-12 (×4): 10 mg via ORAL
  Filled 2024-10-08 (×5): qty 1

## 2024-10-08 MED ORDER — LACTATED RINGERS IV SOLN: INTRAVENOUS | Status: AC

## 2024-10-08 MED ORDER — HYDROMORPHONE HCL 1 MG/ML IJ SOLN
1.0000 mg | INTRAMUSCULAR | Status: DC | PRN
Start: 2024-10-08 — End: 2024-10-12
  Administered 2024-10-09 – 2024-10-10 (×4): 1 mg via INTRAVENOUS
  Filled 2024-10-08 (×4): qty 1

## 2024-10-08 MED ORDER — VANCOMYCIN HCL IN DEXTROSE 1-5 GM/200ML-% IV SOLN: 1000.0000 mg | Freq: Once | INTRAVENOUS | Status: DC

## 2024-10-08 MED ORDER — VANCOMYCIN HCL 2000 MG/400ML IV SOLN
2000.0000 mg | Freq: Once | INTRAVENOUS | Status: AC
  Administered 2024-10-08: 2000 mg via INTRAVENOUS
  Filled 2024-10-08: qty 400

## 2024-10-08 MED ORDER — SODIUM CHLORIDE 0.9% FLUSH: 10.0000 mL | INTRAVENOUS | Status: DC | PRN

## 2024-10-08 MED ORDER — SODIUM CHLORIDE 0.9 % IV SOLN: 2.0000 g | Freq: Once | INTRAVENOUS | Status: DC

## 2024-10-08 MED ORDER — HYDROMORPHONE HCL 1 MG/ML IJ SOLN
1.0000 mg | Freq: Once | INTRAMUSCULAR | Status: AC
  Administered 2024-10-08: 1 mg via INTRAVENOUS
  Filled 2024-10-08: qty 1

## 2024-10-08 MED ORDER — SODIUM CHLORIDE 0.9 % IV SOLN
2.0000 g | Freq: Three times a day (TID) | INTRAVENOUS | Status: DC
  Administered 2024-10-09 (×2): 2 g via INTRAVENOUS
  Filled 2024-10-08 (×2): qty 12.5

## 2024-10-08 MED ORDER — LACTATED RINGERS IV SOLN
150.0000 mL/h | INTRAVENOUS | Status: AC
  Administered 2024-10-08 – 2024-10-09 (×2): 150 mL/h via INTRAVENOUS

## 2024-10-08 MED ORDER — SODIUM CHLORIDE 0.9 % IV SOLN
2.0000 g | Freq: Once | INTRAVENOUS | Status: AC
  Administered 2024-10-08: 2 g via INTRAVENOUS
  Filled 2024-10-08: qty 20

## 2024-10-08 MED ORDER — BICTEGRAVIR-EMTRICITAB-TENOFOV 50-200-25 MG PO TABS
1.0000 | ORAL_TABLET | Freq: Every day | ORAL | Status: DC
  Administered 2024-10-09 – 2024-10-11 (×4): 1 via ORAL
  Filled 2024-10-08 (×4): qty 1

## 2024-10-08 MED ORDER — ONDANSETRON HCL 4 MG/2ML IJ SOLN: 4.0000 mg | Freq: Four times a day (QID) | INTRAMUSCULAR | Status: DC | PRN

## 2024-10-08 MED ORDER — LACTATED RINGERS IV BOLUS (SEPSIS)
500.0000 mL | Freq: Once | INTRAVENOUS | Status: AC
  Administered 2024-10-08: 500 mL via INTRAVENOUS

## 2024-10-08 NOTE — Progress Notes (Signed)
 VASCULAR LAB    Right lower extremity venous duplex has been performed.  See CV proc for preliminary results.  Gave verbal report to Sherlean Carota, PA-C  Ulus Hazen, RVT 10/08/2024, 6:20 PM

## 2024-10-08 NOTE — ED Notes (Signed)
VAS US at bedside. 

## 2024-10-08 NOTE — Sepsis Progress Note (Signed)
 Elink following code sepsis

## 2024-10-08 NOTE — ED Notes (Addendum)
 IV attempt x2 unsuccessful.

## 2024-10-08 NOTE — ED Provider Notes (Addendum)
 Todd Parrish Provider Note   CSN: [id number] Arrival date & time: 10/08/24  1522     Patient presents with: Leg Swelling   Todd Parrish is a 43 y.o. male with past medical history sniffer HIV, previous DVT, PE, cellulitis, lymphedema who presents concern for your lateral right leg swelling for 3 days, fever and chills.  He reports 9/10 back and leg pain.  He reports severe chills.  He has been taking his antiviral medications as directed.  He reports that he does not follow-up with anyone for his lymphedema regularly.   HPI     Prior to Admission medications   Medication Sig Start Date End Date Taking? Authorizing Provider  acetaminophen  (TYLENOL ) 500 MG tablet Take 1 tablet (500 mg total) by mouth every 6 (six) hours as needed for headache or mild pain (pain). 06/05/22   Todd Parrish  bictegravir-emtricitabine -tenofovir  AF (BIKTARVY ) 50-200-25 MG TABS tablet Take 1 tablet by mouth daily. 09/21/24   Todd Kingsley, Parrish  carvedilol  (COREG ) 3.125 MG tablet Take 1 tablet (3.125 mg total) by mouth 2 (two) times daily with a meal. Patient not taking: Reported on 07/31/2022 06/05/22   Todd Parrish  cephALEXin  (KEFLEX ) 500 MG capsule Take 500 mg by mouth 4 (four) times daily.    Provider, Historical, Parrish  docusate sodium  (COLACE) 100 MG capsule Take 1 capsule (100 mg total) by mouth 2 (two) times daily as needed for mild constipation. Patient not taking: Reported on 07/31/2022 06/05/22   Todd Parrish  ferrous sulfate  325 (65 FE) MG tablet Take 1 tablet (325 mg total) by mouth 2 (two) times daily with a meal. Patient not taking: Reported on 07/31/2022 06/05/22   Todd Parrish  folic acid  (FOLVITE ) 1 MG tablet Take 1 tablet (1 mg total) by mouth daily. Patient not taking: Reported on 07/31/2022 06/05/22   Todd Parrish  mupirocin  ointment (BACTROBAN ) 2 % Apply 1 Application topically 2 (two) times daily. 08/18/23    Todd Parrish  oxyCODONE  (OXY IR/ROXICODONE ) 5 MG immediate release tablet Take 1 tablet (5 mg total) by mouth every 4 (four) hours as needed for moderate pain or severe pain. Patient not taking: Reported on 07/31/2022 06/05/22   Todd Parrish    Allergies: Todd Parrish  and Todd Parrish, Porcine (pork) protein-containing drug products, and Shellfish allergy    Review of Systems  All other systems reviewed and are negative.   Updated Vital Signs BP (!) 158/66   Pulse (!) 110   Temp 98 F (36.7 C) (Oral)   Resp (!) 34   Ht 5' 7 (1.702 m)   Wt (!) 158.8 kg   SpO2 98%   BMI 54.82 kg/m   Physical Exam Vitals and nursing note reviewed.  Constitutional:      General: He is not in acute distress.    Appearance: Normal appearance. He is ill-appearing.  HENT:     Head: Normocephalic and atraumatic.  Eyes:     General:        Right eye: No discharge.        Left eye: No discharge.  Cardiovascular:     Rate and Rhythm: Regular rhythm. Tachycardia present.     Pulses: Normal pulses.     Heart sounds: No murmur heard.    No friction rub. No gallop.     Comments: DP, PT pulse are 2+ bil Pulmonary:     Effort: Pulmonary effort is normal.  Breath sounds: Normal breath sounds.  Abdominal:     General: Bowel sounds are normal.     Palpations: Abdomen is soft.  Genitourinary:    Comments: Patient was examined to the scrotum and groin.  Patient has massive scrotal hernia.  However there is no erythema or tenderness of the scrotal wall or the perineum.  No signs of Fournier's gangrene.  The inguinal region does not have any erythema or maceration.  Patient has some lymphadenopathy that is tender but there is no mass or fullness within the inguinal fold. Musculoskeletal:     Comments: Profound bilateral lymphedema, right greater than left, right leg is very warm to the touch, no active drainage, purulence noted  Skin:    General: Skin is warm and dry.     Capillary Refill:  Capillary refill takes less than 2 seconds.  Neurological:     Mental Status: He is alert and oriented to person, place, and time.  Psychiatric:        Mood and Affect: Mood normal.        Behavior: Behavior normal.          (all labs ordered are listed, but only abnormal results are displayed) Labs Reviewed  CBC - Abnormal; Notable for the following components:      Result Value   WBC 13.2 (*)    RBC 4.12 (*)    Hemoglobin 9.8 (*)    HCT 33.9 (*)    MCH 23.8 (*)    MCHC 28.9 (*)    All other components within normal limits  COMPREHENSIVE METABOLIC PANEL WITH GFR - Abnormal; Notable for the following components:   Glucose, Bld 110 (*)    Creatinine, Ser 1.32 (*)    Calcium  8.8 (*)    Albumin  3.4 (*)    All other components within normal limits  I-STAT CG4 LACTIC ACID, ED - Abnormal; Notable for the following components:   Lactic Acid, Venous 4.1 (*)    All other components within normal limits  CULTURE, BLOOD (ROUTINE X 2)  CULTURE, BLOOD (ROUTINE X 2)  I-STAT CG4 LACTIC ACID, ED    EKG: None  Radiology: VAS US  LOWER EXTREMITY VENOUS (DVT) (7a-7p) Result Date: 10/08/2024  Lower Venous DVT Study Patient Name:  Todd Parrish  Date of Exam:   10/08/2024 Medical Rec #: [id number]               Accession #:    [id number] Date of Birth: 1981-08-25               Patient Gender: M Patient Age:   34 years Exam Location:  [facility] Procedure:      VAS US  LOWER EXTREMITY VENOUS (DVT) Referring Phys: CHRISTIAN PROSPERI --------------------------------------------------------------------------------  Indications: Pain, Swelling, and Erythema.  Limitations: Woody edema, lymphedema and body habitus (BMI 55) Comparison Study: Prior negative right LEV done 05/22/2022 Performing Technologist: Alberta Lis RVS  Examination Guidelines: A complete evaluation includes B-mode imaging, spectral Doppler, color Doppler, and power Doppler as needed of all accessible portions of  each vessel. Bilateral testing is considered an integral part of a complete examination. Limited examinations for reoccurring indications may be performed as noted. The reflux portion of the exam is performed with the patient in reverse Trendelenburg.  +---------+---------------+---------+-----------+----------+-------------------+ RIGHT    CompressibilityPhasicitySpontaneityPropertiesThrombus Aging      +---------+---------------+---------+-----------+----------+-------------------+ CFV                     Yes  Yes                  Not well visualized +---------+---------------+---------+-----------+----------+-------------------+ FV Prox                 Yes      No                   Not well visualized +---------+---------------+---------+-----------+----------+-------------------+ FV Mid                                                not visualized      +---------+---------------+---------+-----------+----------+-------------------+ FV Distal               Yes      Yes                  Not well visualized +---------+---------------+---------+-----------+----------+-------------------+ PFV                                                   Not visualized      +---------+---------------+---------+-----------+----------+-------------------+ POP                     Yes      Yes                  Not well visualized +---------+---------------+---------+-----------+----------+-------------------+ PTV                                                   Not well visualized +---------+---------------+---------+-----------+----------+-------------------+ PERO                                                  Not visualized      +---------+---------------+---------+-----------+----------+-------------------+   Left Technical Findings: Left leg not evaluated.   Summary: RIGHT: - There is no evidence of deep vein thrombosis in the lower extremity. However, portions  of this examination were limited- see technologist comments above.   *See table(s) above for measurements and observations.    Preliminary      Procedures  CRITICAL CARE Performed by: Ludivina Shines   Total critical care time: 30 minutes  Critical care time was exclusive of separately billable procedures and treating other patients.  Critical care was necessary to treat or prevent imminent or life-threatening deterioration.  Critical care was time spent personally by me on the following activities: development of treatment plan with patient and/or surrogate as well as nursing, discussions with consultants, evaluation of patient's response to treatment, examination of patient, obtaining history from patient or surrogate, ordering and performing treatments and interventions, ordering and review of laboratory studies, ordering and review of radiographic studies, pulse oximetry and re-evaluation of patient's condition.  Medications Ordered in the ED  sodium chloride  flush (NS) 0.9 % injection 10-40 mL (has no administration in time range)  lactated ringers  infusion ( Intravenous New Bag/Given 10/08/24 2151)  lactated ringers  bolus 500 mL (has no administration in  time range)  HYDROmorphone  (DILAUDID ) injection 1 mg (1 mg Intravenous Given 10/08/24 1956)  cefTRIAXone  (ROCEPHIN ) 2 g in sodium chloride  0.9 % 100 mL IVPB (2 g Intravenous New Bag/Given 10/08/24 2058)                                    Medical Decision Making Amount and/or Complexity of Data Reviewed Labs: ordered.  Risk Prescription drug management. Decision regarding hospitalization.   This patient is a 43 y.o. male  who presents to the ED for concern of leg swelling, pain, redness.   Differential diagnoses prior to evaluation: The emergent differential diagnosis includes, but is not limited to, cellulitis, sepsis, DVT, other soft tissue infection. This is not an exhaustive differential.   Past Medical History /  Co-morbidities / Social History: HIV, GERD, severe lymphedema, obesity, previous PE  Additional history: Chart reviewed. Pertinent results include: Reviewed lab work, imaging from previous emergency room visits  Physical Exam: Physical exam performed. The pertinent findings include: Tachycardia, pulse 111, blood pressure 152/109.  Severe lymphedema bilaterally, right greater than left with warmth, redness consistent with probable cellulitis.  Lab Tests/Imaging studies: I personally interpreted labs/imaging and the pertinent results include: Vascular ultrasound limited secondary to her body habitus, but vascular flow intact at the upper thigh.  CBC, CMP, lactic acid, blood cultures pending at time of handoff.  Cardiac monitoring: EKG obtained and interpreted by myself and attending physician which shows: sinus tachycardia   Medications: I ordered medication including dilaudid  for pain.  I have reviewed the patients home medicines and have made adjustments as needed.   10:04 PM [facility] of [facility] transferred to Dr. Armenta at the end of my shift as the patient will require reassessment once labs/imaging have resulted. Patient presentation, ED course, and plan of care discussed with review of all pertinent labs and imaging. Please see his/her note for further details regarding further ED course and disposition. Plan at time of handoff is suspect likely will need admission for cellulitis, especially in context of HIV, severe obesity, poor vascular flow, lymphedema. This may be altered or completely changed at the discretion of the oncoming team pending results of further workup.  Patient's findings are suggestive of sepsis.  Patient does have lactic acid elevation 4.1.  He has tachycardia and leukocytosis.  Leg is tender and erythematous.  Patient has severe chronic lymphedema.  Physical exam however does not show any evidence of scrotal or perineal involvement.  Examination of the groin  is normal with some lymphadenopathy but no skin changes.  Consult: Dr. Sim for admission.    Final diagnoses:  Cellulitis of right lower extremity  Lymphedema  Severe comorbid illness    ED Discharge Orders     None          Rosan Sherlean VEAR DEVONNA 10/08/24 1937    Armenta Canning, [facility]rish 10/08/24 2141    Armenta Canning, Parrish 10/08/24 2201    Armenta Canning, Parrish 10/08/24 7794    Armenta Canning, [facility]rish 10/08/24 2206

## 2024-10-08 NOTE — ED Triage Notes (Addendum)
 Pt BIBA from home for unilateral right leg swelling x 3 days. Fever and chills x 1 day. Hx of blood clots and cellulitis  Pt A&O x 4 V/S within range
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
## 2024-10-08 NOTE — Progress Notes (Signed)
 Pharmacy Antibiotic Note  Todd Parrish is a 43 y.o. male admitted on 10/08/2024 with severe lymphedema bilaterally.  Pharmacy has been consulted for vancomycin  dosing.  Plan: Vancomycin  2gm IV x 1 then 2250mg  q24h (AUC 510.5, Scr 1.32) Follow renal function and clinical course  Height: 5' 7 (170.2 cm) Weight: (!) 158.8 kg (350 lb) IBW/kg (Calculated) : 66.1  Temp (24hrs), Avg:98.1 F (36.7 C), Min:98 F (36.7 C), Max:98.2 F (36.8 C)  Recent Labs  Lab 10/08/24 2009 10/08/24 2019 10/08/24 2217  WBC 13.2*  --   --   CREATININE 1.32*  --   --   LATICACIDVEN  --  4.1* 3.0*    Estimated Creatinine Clearance: 103.2 mL/min (A) (by C-G formula based on SCr of 1.32 mg/dL (H)).    Allergies  Allergen Reactions   Morphine  And Codeine Nausea And Vomiting and Other (See Comments)    Elevated blood pressure    Porcine (Pork) Protein-Containing Drug Products Other (See Comments)    No pork for religious reasons - doesn't eat pork No pork for religious reasons   Shellfish Allergy Other (See Comments)    No shrimp for religious reasons -doesn't eat shrimp religious puroses No shrimp for religious reasons    Antimicrobials this admission: 10/19 CTX x 1 10/19 vanc >> 10/20 cefepime  >>  Dose adjustments this admission:   Microbiology results: 10/19 BCx:  Thank you for allowing pharmacy to be a part of this patient's care. Leeroy Mace RPh 10/08/2024, 10:53 PM

## 2024-10-09 ENCOUNTER — Other Ambulatory Visit: Payer: Self-pay

## 2024-10-09 ENCOUNTER — Ambulatory Visit: Admitting: Internal Medicine

## 2024-10-09 ENCOUNTER — Other Ambulatory Visit (HOSPITAL_COMMUNITY): Payer: Self-pay

## 2024-10-09 DIAGNOSIS — A419 Sepsis, unspecified organism: Secondary | ICD-10-CM | POA: Diagnosis present

## 2024-10-09 DIAGNOSIS — L0390 Cellulitis, unspecified: Secondary | ICD-10-CM | POA: Diagnosis not present

## 2024-10-09 LAB — CBC
HCT: 35.1 % — ABNORMAL LOW (ref 39.0–52.0)
Hemoglobin: 10.7 g/dL — ABNORMAL LOW (ref 13.0–17.0)
MCH: 25 pg — ABNORMAL LOW (ref 26.0–34.0)
MCHC: 30.5 g/dL (ref 30.0–36.0)
MCV: 82 fL (ref 80.0–100.0)
Platelets: 268 K/uL (ref 150–400)
RBC: 4.28 MIL/uL (ref 4.22–5.81)
RDW: 14.2 % (ref 11.5–15.5)
WBC: 24.6 K/uL — ABNORMAL HIGH (ref 4.0–10.5)
nRBC: 0 % (ref 0.0–0.2)

## 2024-10-09 LAB — COMPREHENSIVE METABOLIC PANEL WITH GFR
ALT: 8 U/L (ref 0–44)
AST: 27 U/L (ref 15–41)
Albumin: 3.5 g/dL (ref 3.5–5.0)
Alkaline Phosphatase: 69 U/L (ref 38–126)
Anion gap: 14 (ref 5–15)
BUN: 19 mg/dL (ref 6–20)
CO2: 22 mmol/L (ref 22–32)
Calcium: 8.7 mg/dL — ABNORMAL LOW (ref 8.9–10.3)
Chloride: 98 mmol/L (ref 98–111)
Creatinine, Ser: 1.59 mg/dL — ABNORMAL HIGH (ref 0.61–1.24)
GFR, Estimated: 54 mL/min — ABNORMAL LOW (ref >60)
Glucose, Bld: 90 mg/dL (ref 70–99)
Potassium: 5 mmol/L (ref 3.5–5.1)
Sodium: 134 mmol/L — ABNORMAL LOW (ref 135–145)
Total Bilirubin: 0.8 mg/dL (ref 0.0–1.2)
Total Protein: 8.3 g/dL — ABNORMAL HIGH (ref 6.5–8.1)

## 2024-10-09 LAB — CORTISOL-AM, BLOOD: Cortisol - AM: 24.5 ug/dL — ABNORMAL HIGH (ref 6.7–22.6)

## 2024-10-09 LAB — PROTIME-INR
INR: 1.2 (ref 0.8–1.2)
Prothrombin Time: 16.3 s — ABNORMAL HIGH (ref 11.4–15.2)

## 2024-10-09 MED ORDER — LACTATED RINGERS IV SOLN
INTRAVENOUS | Status: AC
Start: 2024-10-09 — End: 2024-10-10

## 2024-10-09 MED ORDER — LINEZOLID 600 MG/300ML IV SOLN
600.0000 mg | Freq: Two times a day (BID) | INTRAVENOUS | Status: DC
  Administered 2024-10-09 – 2024-10-11 (×5): 600 mg via INTRAVENOUS
  Filled 2024-10-09 (×6): qty 300

## 2024-10-09 MED ORDER — VANCOMYCIN HCL 1750 MG/350ML IV SOLN: 1750.0000 mg | INTRAVENOUS | Status: DC

## 2024-10-09 MED ORDER — CHLORHEXIDINE GLUCONATE CLOTH 2 % EX PADS
6.0000 | MEDICATED_PAD | Freq: Every day | CUTANEOUS | Status: DC
  Administered 2024-10-10 – 2024-10-12 (×2): 6 via TOPICAL

## 2024-10-09 MED ORDER — LOPERAMIDE HCL 2 MG PO CAPS
2.0000 mg | ORAL_CAPSULE | Freq: Once | ORAL | Status: AC
  Administered 2024-10-09: 2 mg via ORAL
  Filled 2024-10-09: qty 1

## 2024-10-09 MED ORDER — BENZOCAINE 10 % MT GEL
Freq: Three times a day (TID) | OROMUCOSAL | Status: DC | PRN
  Administered 2024-10-09 (×2): 1 via OROMUCOSAL
  Filled 2024-10-09: qty 9.4

## 2024-10-09 MED ORDER — ACETAMINOPHEN 325 MG PO TABS
650.0000 mg | ORAL_TABLET | Freq: Four times a day (QID) | ORAL | Status: AC | PRN
  Administered 2024-10-09 – 2024-10-10 (×2): 650 mg via ORAL
  Filled 2024-10-09 (×2): qty 2

## 2024-10-09 MED ORDER — MUPIROCIN 2 % EX OINT
1.0000 | TOPICAL_OINTMENT | Freq: Two times a day (BID) | CUTANEOUS | Status: DC
  Administered 2024-10-09 – 2024-10-12 (×7): 1 via NASAL
  Filled 2024-10-09 (×2): qty 22

## 2024-10-09 NOTE — Progress Notes (Signed)
 Pharmacy Antibiotic Note  Todd Parrish is a 43 y.o. male with hx HIV and lymphedema who presented to the ED on 10/08/2024 with RLE swelling, fever and chills.  She is currently on vancomycin  and cefepime  for cellulitis.   Today, 10/09/2024: - wbc elevated 24.6 - scr up 1.59 (crcl~85)   Plan: - adjust vancomycin  dose to 1750 mg IV q24h for est AUC 445 - cefepime  2 gm IV q8h - monitor renal function closely   _________________________________________  Height: 5' 7 (170.2 cm) Weight: (!) 158.8 kg (350 lb) IBW/kg (Calculated) : 66.1  Temp (24hrs), Avg:98.4 F (36.9 C), Min:98 F (36.7 C), Max:99.8 F (37.7 C)  Recent Labs  Lab 10/08/24 2009 10/08/24 2019 10/08/24 2217 10/09/24 0625  WBC 13.2*  --   --  24.6*  CREATININE 1.32*  --   --  1.59*  LATICACIDVEN  --  4.1* 3.0*  --     Estimated Creatinine Clearance: 85.6 mL/min (A) (by C-G formula based on SCr of 1.59 mg/dL (H)).    Allergies  Allergen Reactions   Morphine  And Codeine Nausea And Vomiting and Other (See Comments)    Elevated blood pressure    Porcine (Pork) Protein-Containing Drug Products Other (See Comments)    No pork for religious reasons - doesn't eat pork No pork for religious reasons   Shellfish Allergy Other (See Comments)    No shrimp for religious reasons -doesn't eat shrimp religious puroses No shrimp for religious reasons     Thank you for allowing pharmacy to be a part of this patient's care.  Osie Iantha SQUIBB 10/09/2024 7:31 AM

## 2024-10-09 NOTE — Plan of Care (Signed)
  Problem: Education: Goal: Knowledge of General Education information will improve Description: Including pain rating scale, medication(s)/side effects and non-pharmacologic comfort measures Outcome: Progressing   Problem: Clinical Measurements: Goal: Ability to maintain clinical measurements within normal limits will improve Outcome: Progressing   Problem: Activity: Goal: Risk for activity intolerance will decrease Outcome: Progressing   Problem: Nutrition: Goal: Adequate nutrition will be maintained Outcome: Progressing   Problem: Elimination: Goal: Will not experience complications related to bowel motility Outcome: Progressing Goal: Will not experience complications related to urinary retention Outcome: Progressing   Problem: Pain Managment: Goal: General experience of comfort will improve and/or be controlled Outcome: Progressing
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
## 2024-10-09 NOTE — Progress Notes (Signed)
 Infiltration at midline site on upper left arm. Unit RN had removed midline. New US  PIV started on right forearm.

## 2024-10-09 NOTE — Progress Notes (Signed)
 Patient requesting imodium for diarrhea d/t HIV medication. Notified J. Blondie, NP. New one-time order for imodium. Will give and continue to monitor.

## 2024-10-09 NOTE — ED Notes (Signed)
 Rashid MD made aware of inability to obtain HIV and CD4 labs.

## 2024-10-09 NOTE — Progress Notes (Signed)
   10/09/24 1724  Assess: MEWS Score  Temp 98.1 F (36.7 C)  BP (!) 129/57  MAP (mmHg) 76  Pulse Rate (!) 110  ECG Heart Rate (!) 114  Resp (!) 22  SpO2 98 %  O2 Device Room Air  Assess: MEWS Score  MEWS Temp 0  MEWS Systolic 0  MEWS Pulse 2  MEWS RR 1  MEWS LOC 0  MEWS Score 3  MEWS Score Color Yellow  Assess: if the MEWS score is Yellow or Red  Were vital signs accurate and taken at a resting state? Yes  Does the patient meet 2 or more of the SIRS criteria? Yes  Does the patient have a confirmed or suspected source of infection? Yes  MEWS guidelines implemented  Yes, yellow  Treat  MEWS Interventions Considered administering scheduled or prn medications/treatments as ordered  Take Vital Signs  Increase Vital Sign Frequency  Yellow: Q2hr x1, continue Q4hrs until patient remains green for 12hrs  Escalate  MEWS: Escalate Yellow: Discuss with charge nurse and consider notifying provider and/or RRT  Notify: Charge Nurse/RN  Name of Charge Nurse/RN Notified Cindy RN  Provider Notification  Provider Name/Title Rashid MD  Date Provider Notified 10/09/24  Time Provider Notified 1909  Method of Notification Page  Notification Reason Change in status;Other (Comment) (yellow MEWS)  Assess: SIRS CRITERIA  SIRS Temperature  0  SIRS Respirations  1  SIRS Pulse 1  SIRS WBC 1  SIRS Score Sum  3

## 2024-10-09 NOTE — Progress Notes (Signed)
 Returned biktarvy  to stock. Medication ready at [facility] since 10.2.25 and patient is currently hospitalized. Will reach out to patient later this week to setup refill again.

## 2024-10-10 ENCOUNTER — Other Ambulatory Visit (HOSPITAL_COMMUNITY): Payer: Self-pay

## 2024-10-10 DIAGNOSIS — A419 Sepsis, unspecified organism: Secondary | ICD-10-CM | POA: Diagnosis not present

## 2024-10-10 DIAGNOSIS — L0390 Cellulitis, unspecified: Secondary | ICD-10-CM | POA: Diagnosis not present

## 2024-10-10 LAB — CBC WITH DIFFERENTIAL/PLATELET
Abs Immature Granulocytes: 0.47 K/uL — ABNORMAL HIGH (ref 0.00–0.07)
Basophils Absolute: 0.1 K/uL (ref 0.0–0.1)
Basophils Relative: 0 %
Eosinophils Absolute: 0.1 K/uL (ref 0.0–0.5)
Eosinophils Relative: 0 %
HCT: 33.9 % — ABNORMAL LOW (ref 39.0–52.0)
Hemoglobin: 10.1 g/dL — ABNORMAL LOW (ref 13.0–17.0)
Immature Granulocytes: 2 %
Lymphocytes Relative: 4 %
Lymphs Abs: 1 K/uL (ref 0.7–4.0)
MCH: 24.5 pg — ABNORMAL LOW (ref 26.0–34.0)
MCHC: 29.8 g/dL — ABNORMAL LOW (ref 30.0–36.0)
MCV: 82.1 fL (ref 80.0–100.0)
Monocytes Absolute: 0.7 K/uL (ref 0.1–1.0)
Monocytes Relative: 3 %
Neutro Abs: 22.2 K/uL — ABNORMAL HIGH (ref 1.7–7.7)
Neutrophils Relative %: 91 %
Platelets: 194 K/uL (ref 150–400)
RBC: 4.13 MIL/uL — ABNORMAL LOW (ref 4.22–5.81)
RDW: 14.1 % (ref 11.5–15.5)
Smear Review: NORMAL
WBC: 24.5 K/uL — ABNORMAL HIGH (ref 4.0–10.5)
nRBC: 0 % (ref 0.0–0.2)

## 2024-10-10 LAB — CD4/CD8 (T-HELPER/T-SUPPRESSOR CELL)
CD4 absolute: 286 /uL — ABNORMAL LOW (ref 400–1790)
CD4%: 37.71 % (ref 33–65)
CD8 T Cell Abs: 223 /uL (ref 190–1000)
CD8tox: 29.45 % (ref 12–40)
Ratio: 1.28 (ref 1.0–3.0)
Total lymphocyte count: 759 /uL — ABNORMAL LOW (ref 1000–4000)

## 2024-10-10 LAB — CREATININE, SERUM
Creatinine, Ser: 1.09 mg/dL (ref 0.61–1.24)
GFR, Estimated: >60 mL/min (ref >60)

## 2024-10-10 LAB — HIV-1 RNA QUANT-NO REFLEX-BLD
HIV 1 RNA Quant: <20 {copies}/mL
LOG10 HIV-1 RNA: UNDETERMINED {Log_copies}/mL

## 2024-10-10 MED ORDER — LACTATED RINGERS IV SOLN: INTRAVENOUS | Status: AC

## 2024-10-10 MED ORDER — CARMEX CLASSIC LIP BALM EX OINT
1.0000 | TOPICAL_OINTMENT | CUTANEOUS | Status: DC | PRN
  Administered 2024-10-10: 1 via TOPICAL
  Filled 2024-10-10: qty 10

## 2024-10-10 MED ORDER — DIPHENHYDRAMINE HCL 25 MG PO CAPS
25.0000 mg | ORAL_CAPSULE | Freq: Once | ORAL | Status: DC | PRN
  Filled 2024-10-10: qty 1

## 2024-10-10 MED ORDER — DIPHENHYDRAMINE HCL 25 MG PO CAPS
25.0000 mg | ORAL_CAPSULE | Freq: Once | ORAL | Status: AC | PRN
  Administered 2024-10-10: 25 mg via ORAL
  Filled 2024-10-10: qty 1

## 2024-10-10 NOTE — Plan of Care (Signed)
  Problem: Fluid Volume: Goal: Hemodynamic stability will improve Outcome: Progressing   Problem: Clinical Measurements: Goal: Diagnostic test results will improve Outcome: Progressing Goal: Signs and symptoms of infection will decrease Outcome: Progressing   Problem: Respiratory: Goal: Ability to maintain adequate ventilation will improve Outcome: Progressing   Problem: Pain Managment: Goal: General experience of comfort will improve and/or be controlled Outcome: Progressing   Problem: Skin Integrity: Goal: Risk for impaired skin integrity will decrease Outcome: Progressing

## 2024-10-10 NOTE — Plan of Care (Signed)

## 2024-10-10 NOTE — Progress Notes (Addendum)
 Patient c/o itching all over. Notified J. Blondie, NP. New one-time order for benadryl  ordered and given. Will continue to monitor.
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
## 2024-10-11 DIAGNOSIS — I890 Lymphedema, not elsewhere classified: Secondary | ICD-10-CM

## 2024-10-11 DIAGNOSIS — L0390 Cellulitis, unspecified: Secondary | ICD-10-CM | POA: Diagnosis not present

## 2024-10-11 DIAGNOSIS — R69 Illness, unspecified: Secondary | ICD-10-CM | POA: Diagnosis not present

## 2024-10-11 DIAGNOSIS — L03115 Cellulitis of right lower limb: Secondary | ICD-10-CM

## 2024-10-11 LAB — CBC
HCT: 31.8 % — ABNORMAL LOW (ref 39.0–52.0)
Hemoglobin: 9.5 g/dL — ABNORMAL LOW (ref 13.0–17.0)
MCH: 24.4 pg — ABNORMAL LOW (ref 26.0–34.0)
MCHC: 29.9 g/dL — ABNORMAL LOW (ref 30.0–36.0)
MCV: 81.7 fL (ref 80.0–100.0)
Platelets: 206 K/uL (ref 150–400)
RBC: 3.89 MIL/uL — ABNORMAL LOW (ref 4.22–5.81)
RDW: 14 % (ref 11.5–15.5)
WBC: 21.6 K/uL — ABNORMAL HIGH (ref 4.0–10.5)
nRBC: 0 % (ref 0.0–0.2)

## 2024-10-11 LAB — BASIC METABOLIC PANEL WITH GFR
Anion gap: 11 (ref 5–15)
BUN: 9 mg/dL (ref 6–20)
CO2: 23 mmol/L (ref 22–32)
Calcium: 8.7 mg/dL — ABNORMAL LOW (ref 8.9–10.3)
Chloride: 102 mmol/L (ref 98–111)
Creatinine, Ser: 0.85 mg/dL (ref 0.61–1.24)
GFR, Estimated: >60 mL/min (ref >60)
Glucose, Bld: 117 mg/dL — ABNORMAL HIGH (ref 70–99)
Potassium: 3.6 mmol/L (ref 3.5–5.1)
Sodium: 135 mmol/L (ref 135–145)

## 2024-10-11 MED ORDER — ACETAMINOPHEN 325 MG PO TABS
650.0000 mg | ORAL_TABLET | Freq: Four times a day (QID) | ORAL | Status: DC | PRN
  Administered 2024-10-11: 650 mg via ORAL
  Filled 2024-10-11: qty 2

## 2024-10-11 MED ORDER — CARVEDILOL 3.125 MG PO TABS
3.1250 mg | ORAL_TABLET | Freq: Two times a day (BID) | ORAL | Status: DC
  Administered 2024-10-11 – 2024-10-12 (×2): 3.125 mg via ORAL
  Filled 2024-10-11 (×2): qty 1
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
## 2024-10-11 NOTE — Plan of Care (Signed)
  Problem: Fluid Volume: Goal: Hemodynamic stability will improve Outcome: Progressing   Problem: Clinical Measurements: Goal: Diagnostic test results will improve Outcome: Progressing Goal: Signs and symptoms of infection will decrease Outcome: Progressing   Problem: Respiratory: Goal: Ability to maintain adequate ventilation will improve Outcome: Progressing   Problem: Education: Goal: Knowledge of General Education information will improve Description: Including pain rating scale, medication(s)/side effects and non-pharmacologic comfort measures Outcome: Progressing   Problem: Health Behavior/Discharge Planning: Goal: Ability to manage health-related needs will improve Outcome: Progressing   Problem: Clinical Measurements: Goal: Ability to maintain clinical measurements within normal limits will improve Outcome: Progressing Goal: Will remain free from infection Outcome: Progressing Goal: Diagnostic test results will improve Outcome: Progressing Goal: Respiratory complications will improve Outcome: Progressing Goal: Cardiovascular complication will be avoided Outcome: Progressing   Problem: Activity: Goal: Risk for activity intolerance will decrease Outcome: Progressing   Problem: Nutrition: Goal: Adequate nutrition will be maintained Outcome: Progressing   Problem: Elimination: Goal: Will not experience complications related to bowel motility Outcome: Progressing Goal: Will not experience complications related to urinary retention Outcome: Progressing   Problem: Pain Managment: Goal: General experience of comfort will improve and/or be controlled Outcome: Progressing   Problem: Skin Integrity: Goal: Risk for impaired skin integrity will decrease Outcome: Progressing

## 2024-10-11 NOTE — Plan of Care (Signed)
  Problem: Nutrition: Goal: Adequate nutrition will be maintained Outcome: Progressing   Problem: Coping: Goal: Level of anxiety will decrease Outcome: Progressing   Problem: Pain Managment: Goal: General experience of comfort will improve and/or be controlled Outcome: Progressing   Problem: Skin Integrity: Goal: Risk for impaired skin integrity will decrease Outcome: Progressing

## 2024-10-12 ENCOUNTER — Other Ambulatory Visit (HOSPITAL_COMMUNITY): Payer: Self-pay

## 2024-10-12 DIAGNOSIS — I890 Lymphedema, not elsewhere classified: Secondary | ICD-10-CM | POA: Diagnosis not present

## 2024-10-12 DIAGNOSIS — L03115 Cellulitis of right lower limb: Secondary | ICD-10-CM | POA: Diagnosis not present

## 2024-10-12 DIAGNOSIS — L0390 Cellulitis, unspecified: Secondary | ICD-10-CM | POA: Diagnosis not present

## 2024-10-12 DIAGNOSIS — R69 Illness, unspecified: Secondary | ICD-10-CM | POA: Diagnosis not present

## 2024-10-12 LAB — CBC
HCT: 28.6 % — ABNORMAL LOW (ref 39.0–52.0)
Hemoglobin: 8.8 g/dL — ABNORMAL LOW (ref 13.0–17.0)
MCH: 24.6 pg — ABNORMAL LOW (ref 26.0–34.0)
MCHC: 30.8 g/dL (ref 30.0–36.0)
MCV: 79.9 fL — ABNORMAL LOW (ref 80.0–100.0)
Platelets: 229 K/uL (ref 150–400)
RBC: 3.58 MIL/uL — ABNORMAL LOW (ref 4.22–5.81)
RDW: 13.9 % (ref 11.5–15.5)
WBC: 19.7 K/uL — ABNORMAL HIGH (ref 4.0–10.5)
nRBC: 0 % (ref 0.0–0.2)

## 2024-10-12 LAB — RENAL FUNCTION PANEL
Albumin: 2.7 g/dL — ABNORMAL LOW (ref 3.5–5.0)
Anion gap: 10 (ref 5–15)
BUN: 8 mg/dL (ref 6–20)
CO2: 25 mmol/L (ref 22–32)
Calcium: 8.7 mg/dL — ABNORMAL LOW (ref 8.9–10.3)
Chloride: 101 mmol/L (ref 98–111)
Creatinine, Ser: 0.84 mg/dL (ref 0.61–1.24)
GFR, Estimated: >60 mL/min (ref >60)
Glucose, Bld: 126 mg/dL — ABNORMAL HIGH (ref 70–99)
Phosphorus: 2.7 mg/dL (ref 2.5–4.6)
Potassium: 3.3 mmol/L — ABNORMAL LOW (ref 3.5–5.1)
Sodium: 136 mmol/L (ref 135–145)

## 2024-10-12 MED ORDER — LINEZOLID 600 MG PO TABS
600.0000 mg | ORAL_TABLET | Freq: Two times a day (BID) | ORAL | 0 refills | Status: AC
  Filled 2024-10-12: qty 8, 4d supply, fill #0

## 2024-10-12 MED ORDER — LINEZOLID 600 MG PO TABS
600.0000 mg | ORAL_TABLET | Freq: Two times a day (BID) | ORAL | Status: DC
  Administered 2024-10-12: 600 mg via ORAL
  Filled 2024-10-12: qty 1

## 2024-10-12 NOTE — Plan of Care (Signed)
  Problem: Fluid Volume: Goal: Hemodynamic stability will improve Outcome: Completed/Met   Problem: Clinical Measurements: Goal: Diagnostic test results will improve Outcome: Completed/Met Goal: Signs and symptoms of infection will decrease Outcome: Completed/Met   Problem: Respiratory: Goal: Ability to maintain adequate ventilation will improve Outcome: Completed/Met   Problem: Education: Goal: Knowledge of General Education information will improve Description: Including pain rating scale, medication(s)/side effects and non-pharmacologic comfort measures Outcome: Completed/Met   Problem: Health Behavior/Discharge Planning: Goal: Ability to manage health-related needs will improve Outcome: Completed/Met   Problem: Clinical Measurements: Goal: Ability to maintain clinical measurements within normal limits will improve Outcome: Completed/Met Goal: Will remain free from infection Outcome: Completed/Met Goal: Diagnostic test results will improve Outcome: Completed/Met Goal: Respiratory complications will improve Outcome: Completed/Met Goal: Cardiovascular complication will be avoided Outcome: Completed/Met   Problem: Activity: Goal: Risk for activity intolerance will decrease Outcome: Completed/Met   Problem: Nutrition: Goal: Adequate nutrition will be maintained Outcome: Completed/Met   Problem: Coping: Goal: Level of anxiety will decrease Outcome: Completed/Met   Problem: Elimination: Goal: Will not experience complications related to bowel motility Outcome: Completed/Met Goal: Will not experience complications related to urinary retention Outcome: Completed/Met   Problem: Pain Managment: Goal: General experience of comfort will improve and/or be controlled Outcome: Completed/Met   Problem: Safety: Goal: Ability to remain free from injury will improve Outcome: Completed/Met   Problem: Skin Integrity: Goal: Risk for impaired skin integrity will  decrease Outcome: Completed/Met

## 2024-10-12 NOTE — TOC Initial Note (Addendum)
 Transition of Care [facility]) - Initial/Assessment Note    Patient Details  Name: Todd Parrish MRN: [id number] Date of Birth: October 31, 1981  Transition of Care [facility]) CM/SW Contact:    Todd LITTIE Agar, RN Phone Number:[phone]  10/12/2024, 11:53 AM  Clinical Narrative:                 Inpatient care manger following patient with MD request for follow up with wound care center for weekly dressings. Wound care center supervisor has reviewed chart and can not accept patient for appointment. Wound care center has recommended the lymphedema clinic. MD has been updated and agreeable with appt for lymphedema clinic. Lymphedema clinic is booked til Jan / Feb. CM attempted to call [facility] Lymphedema clinic. CM attempted to call [facility] there is no answer. Voicemail has been left. Team has been updated.   AVS has been updated with info to follow up with [facility] for Lymphedema clinic appointment.   Expected Discharge Plan: Home/Self Care Barriers to Discharge: No Barriers Identified   Patient Goals and CMS Choice Patient states their goals for this hospitalization and ongoing recovery are:: Ready to go home CMS Medicare.gov Compare Post Acute Care list provided to::  (n/a) Choice offered to / list presented to : NA [place] ownership interest in [facility].provided to::  (n/a)    Expected Discharge Plan and Services In-house Referral: NA Discharge Planning Services: CM Consult Post Acute Care Choice: NA Living arrangements for the past 2 months: Single Family Home Expected Discharge Date: 10/12/24               DME Arranged: N/A DME Agency: NA       HH Arranged: NA HH Agency: NA        Prior Living Arrangements/Services Living arrangements for the past 2 months: Single Family Home Lives with:: Self Patient language and need for interpreter reviewed:: Yes Do you feel safe going back to the place where you live?: Yes      Need for  Family Participation in Patient Care: No (Comment) Care giver support system in place?: Yes (comment) Current home services:  (n/a) Criminal Activity/Legal Involvement Pertinent to Current Situation/Hospitalization: No - Comment as needed  Activities of Daily Living   ADL Screening (condition at time of admission) Independently performs ADLs?: Yes (appropriate for developmental age) Is the patient deaf or have difficulty hearing?: No Does the patient have difficulty seeing, even when wearing glasses/contacts?: No Does the patient have difficulty concentrating, remembering, or making decisions?: No  Permission Sought/Granted Permission sought to share information with : Family Supports Permission granted to share information with : No              Emotional Assessment Appearance:: Appears stated age Attitude/Demeanor/Rapport: Gracious Affect (typically observed): Quiet Orientation: : Oriented to Self, Oriented to Place, Oriented to  Time, Oriented to Situation Alcohol / Substance Use: Not Applicable Psych Involvement: No (comment)  Admission diagnosis:  Lymphedema [I89.0] Cellulitis of right lower extremity [L03.115] Sepsis (HCC) [A41.9] Severe comorbid illness [R69] Sepsis due to cellulitis (HCC) [L03.90, A41.9] Patient Active Problem List   Diagnosis Date Noted   Sepsis (HCC) 10/09/2024   Sepsis due to cellulitis (HCC) 10/08/2024   Cellulitis of right lower extremity 05/22/2022   Cellulitis and abscess of right lower extremity 05/22/2022   Wound of right leg, initial encounter 05/22/2022   Cellulitis, leg 07/22/2020   Edema 07/15/2019   Protein-calorie malnutrition, severe 07/05/2019   Anemia of  chronic disease 01/25/2017   Personal history of DVT (deep vein thrombosis) 01/25/2017   History of pulmonary embolism 01/25/2017   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/2017   Chronic acquired lymphedema    Essential hypertension    Tibial plateau fracture     Pre-diabetes    Retroperitoneal bleed    PEA (Pulseless electrical activity) (HCC)    Cardiac arrest (HCC) 01/29/2016   Neuropathy 12/23/2015   Benign paroxysmal positional vertigo 12/23/2015   Class 3 severe obesity due to excess calories with serious comorbidity and body mass index (BMI) of 50.0 to 59.9 in adult [facility]) 12/12/2015   Constipation due to opioid therapy 12/03/2015   Seasonal allergies 12/03/2015   Human immunodeficiency virus (HIV) disease (HCC) 11/30/2015   Inguinal hernia    Right medial tibial plateau fracture 11/25/2015   PCP:  Pcp, No Pharmacy:   Todd Parrish - [facility] [address] [place] [address] Phone: [phone] Fax: [phone]  [id number] [place], [place] - [address] AT [address] [place] [address] Phone: [phone] Fax: [phone]  [facility] #[id number] [place], [place] - [address] AT [facility] [address] [facility] [address] [place] [address] Phone: [phone] Fax: [phone]  [facility] [address] [place] [address] Phone: [phone] Fax: [phone]     Social Drivers of Health (SDOH) Social History: SDOH Screenings   Food Insecurity: No Food Insecurity (10/09/2024)  Housing: High Risk (10/09/2024)  Transportation Needs: No Transportation Needs (10/09/2024)  Utilities: Not At Risk (10/09/2024)  Depression (PHQ2-9): Low Risk  (07/31/2022)  Financial Resource Strain: Medium Risk (12/31/2023)  Tobacco Use: Low Risk  (10/08/2024)   SDOH Interventions:     Readmission Risk Interventions     No data to display

## 2024-10-12 NOTE — Progress Notes (Signed)
 Discharge medication delivered to patient at the bedside in a secure bag

## 2024-10-12 NOTE — Plan of Care (Signed)
  Problem: Respiratory: Goal: Ability to maintain adequate ventilation will improve Outcome: Progressing   Problem: Education: Goal: Knowledge of General Education information will improve Description: Including pain rating scale, medication(s)/side effects and non-pharmacologic comfort measures Outcome: Progressing   Problem: Activity: Goal: Risk for activity intolerance will decrease Outcome: Progressing   Problem: Nutrition: Goal: Adequate nutrition will be maintained Outcome: Progressing   Problem: Coping: Goal: Level of anxiety will decrease Outcome: Progressing

## 2024-10-12 NOTE — TOC Transition Note (Signed)
 Transition of Care [facility]) - Discharge Note   Patient Details  Name: Todd Parrish MRN: [id number] Date of Birth: 10-05-1981  Transition of Care [facility]) CM/SW Contact:  [name], RN Phone [phone]  10/12/2024, 1:05 PM   Clinical Narrative:    Outpatient Lymphedema referral has been faxed to [facility] health. MD is aware patient has been updated. Appointment is tentatively schedules for Nov. 20. Faciliy to follow up with details and info has been provided for patient to follow up as well. No other needs noted.    Final next level of care: Home/Self Care Barriers to Discharge: No Barriers Identified   Patient Goals and CMS Choice Patient states their goals for this hospitalization and ongoing recovery are:: Ready to go home CMS Medicare.gov Compare Post Acute Care list provided to::  (n/a) Choice offered to / list presented to : NA [place] ownership interest in [facility].provided to::  (n/a)    Discharge Placement                       Discharge Plan and Services Additional resources added to the After Visit Summary for   In-house Referral: NA Discharge Planning Services: CM Consult Post Acute Care Choice: NA          DME Arranged: N/A DME Agency: NA       HH Arranged: NA HH Agency: NA        Social Drivers of Health (SDOH) Interventions SDOH Screenings   Food Insecurity: No Food Insecurity (10/09/2024)  Housing: High Risk (10/09/2024)  Transportation Needs: No Transportation Needs (10/09/2024)  Utilities: Not At Risk (10/09/2024)  Depression (PHQ2-9): Low Risk  (07/31/2022)  Financial Resource Strain: Medium Risk (12/31/2023)  Tobacco Use: Low Risk  (10/08/2024)     Readmission Risk Interventions     No data to display
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
## 2024-10-12 NOTE — Consult Note (Addendum)
 WOC Nurse Consult Note: Pt has chronic lymphedema and has been followed in the past by the outpatient wound care center and Dr Harden of the ortho team for full thickness wounds which healed.  He wears custom-fitted compression stockings to reduce the edema prior to admission but recently the right leg became too swollen for the compression stockings to fit.  He developed cellulitis and increased edema and weeping.  He is on systemic antibiotics to treat the cellulitis.  Left leg without open wounds or drainage Right leg with posterior partial thickness wounds, red and moist in a patchy area, approx 4X4X.1cm with intact skin in between, mod amt yellow drainage.   Dressing procedure/placement/frequency: Topical treatment orders provided for bedside nurses to perform as follows to provide antimicrobial benefits and absorb drainage: Change dressing to right leg Q Thurs/Sat/Mon as follows: Apply Aquacel [place] # [phone]) to right posterior leg, then cover with foam dressing.  Foam dressing can be changed weekly if not soiled.  Cover with kerlex and ace wrap to hold in place.  Pt plans to discharge soon.  Provided with 4 sets of each supply and demonstrated dressing change procedure.  He states he has a friend or his sister who will be able to assist with dressing changes.   Pt would like to resume follow-up at the outpatient wound care center.  Primary team; please order if desired.  This must be by physician referral and the case manager can assist if you agree.   Please re-consult if further assistance is needed.  Thank-you,  Stephane Fought MSN, RN, CWOCN, CWCN-AP, CNS Contact Mon-Fri 0700-1500: [phone]
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
## 2024-10-13 LAB — CULTURE, BLOOD (ROUTINE X 2)
Culture: NO GROWTH
Special Requests: ADEQUATE

## 2024-10-14 LAB — CULTURE, BLOOD (ROUTINE X 2): Culture: NO GROWTH

## 2024-10-17 ENCOUNTER — Other Ambulatory Visit: Payer: Self-pay

## 2024-10-18 ENCOUNTER — Other Ambulatory Visit (HOSPITAL_COMMUNITY): Payer: Self-pay

## 2024-10-19 ENCOUNTER — Ambulatory Visit: Admitting: Internal Medicine

## 2024-10-20 ENCOUNTER — Other Ambulatory Visit (HOSPITAL_COMMUNITY): Payer: Self-pay

## 2024-10-24 ENCOUNTER — Other Ambulatory Visit: Payer: Self-pay

## 2024-10-24 NOTE — Progress Notes (Signed)
=  Specialty Pharmacy Refill Coordination Note  Todd Parrish is a 43 y.o. male contacted today regarding refills of specialty medication(s) Bictegravir-Emtricitab-Tenofov (Biktarvy )   Patient requested [name] at [facility] [place] date: 10/24/24   Medication will be filled on: 10/24/24

## 2024-10-28 DIAGNOSIS — Z7689 Persons encountering health services in other specified circumstances: Secondary | ICD-10-CM | POA: Diagnosis not present

## 2024-10-28 DIAGNOSIS — N3000 Acute cystitis without hematuria: Secondary | ICD-10-CM | POA: Diagnosis not present

## 2024-10-28 DIAGNOSIS — R300 Dysuria: Secondary | ICD-10-CM | POA: Diagnosis not present

## 2024-11-02 ENCOUNTER — Other Ambulatory Visit: Payer: Self-pay

## 2024-11-02 NOTE — Progress Notes (Signed)
 Specialty Pharmacy Ongoing Clinical Assessment Note  Todd Parrish is a 43 y.o. male who is being followed by the specialty pharmacy service for RxSp HIV   Patient's specialty medication(s) reviewed today: Bictegravir-Emtricitab-Tenofov (Biktarvy )   Missed doses in the last 4 weeks: 0   Patient/Caregiver did not have any additional questions or concerns.   Therapeutic benefit summary: Patient is achieving benefit   Adverse events/side effects summary: No adverse events/side effects   Patient's therapy is appropriate to: Continue    Goals Addressed             This Visit's Progress    Achieve Undetectable HIV Viral Load < 20   On track    Patient is on track. Patient will maintain adherence. Viral load remains undetectable long term.          Follow up: 12 months  Delon CHRISTELLA Brow Specialty Pharmacist

## 2024-11-03 ENCOUNTER — Other Ambulatory Visit (HOSPITAL_COMMUNITY): Payer: Self-pay

## 2024-11-12 DIAGNOSIS — K047 Periapical abscess without sinus: Secondary | ICD-10-CM | POA: Diagnosis not present

## 2024-11-23 DIAGNOSIS — Z7689 Persons encountering health services in other specified circumstances: Secondary | ICD-10-CM | POA: Diagnosis not present

## 2024-11-23 DIAGNOSIS — L03115 Cellulitis of right lower limb: Secondary | ICD-10-CM | POA: Diagnosis not present

## 2024-11-23 DIAGNOSIS — I890 Lymphedema, not elsewhere classified: Secondary | ICD-10-CM | POA: Diagnosis not present

## 2024-11-23 DIAGNOSIS — D509 Iron deficiency anemia, unspecified: Secondary | ICD-10-CM | POA: Diagnosis not present

## 2024-11-23 DIAGNOSIS — L02415 Cutaneous abscess of right lower limb: Secondary | ICD-10-CM | POA: Diagnosis not present

## 2024-11-23 DIAGNOSIS — B20 Human immunodeficiency virus [HIV] disease: Secondary | ICD-10-CM | POA: Diagnosis not present

## 2024-11-23 DIAGNOSIS — R030 Elevated blood-pressure reading, without diagnosis of hypertension: Secondary | ICD-10-CM | POA: Diagnosis not present

## 2024-11-23 DIAGNOSIS — S82131A Displaced fracture of medial condyle of right tibia, initial encounter for closed fracture: Secondary | ICD-10-CM | POA: Diagnosis not present

## 2024-11-24 ENCOUNTER — Other Ambulatory Visit: Payer: Self-pay

## 2024-11-24 ENCOUNTER — Other Ambulatory Visit: Payer: Self-pay | Admitting: Internal Medicine

## 2024-11-24 DIAGNOSIS — B20 Human immunodeficiency virus [HIV] disease: Secondary | ICD-10-CM

## 2024-11-27 ENCOUNTER — Other Ambulatory Visit: Payer: Self-pay

## 2024-11-27 NOTE — Telephone Encounter (Signed)
 Last seen 08/2023. Patient has canceled/no-showed his last 4 appointments. Needs appointment for refills.   Giles Currie, BSN, RN

## 2024-11-28 ENCOUNTER — Other Ambulatory Visit: Payer: Self-pay

## 2024-11-28 DIAGNOSIS — B20 Human immunodeficiency virus [HIV] disease: Secondary | ICD-10-CM

## 2024-11-28 MED ORDER — BIKTARVY 50-200-25 MG PO TABS
1.0000 | ORAL_TABLET | Freq: Every day | ORAL | 0 refills | Status: AC
  Filled 2024-11-28 – 2024-12-25 (×3): qty 30, 30d supply, fill #0

## 2024-11-29 ENCOUNTER — Other Ambulatory Visit: Payer: Self-pay

## 2024-11-29 ENCOUNTER — Other Ambulatory Visit (HOSPITAL_COMMUNITY): Payer: Self-pay

## 2024-12-01 ENCOUNTER — Other Ambulatory Visit: Payer: Self-pay

## 2024-12-01 ENCOUNTER — Other Ambulatory Visit: Payer: Self-pay | Admitting: Pharmacy Technician

## 2024-12-01 NOTE — Progress Notes (Signed)
 Specialty Pharmacy Refill Coordination Note  Todd Parrish is a 43 y.o. male contacted today regarding refills of specialty medication(s) Bictegravir-Emtricitab-Tenofov (Biktarvy )   Patient requested [name] at [facility] [place] date: 12/04/24   Medication will be filled on: 12/01/24

## 2024-12-06 DIAGNOSIS — F411 Generalized anxiety disorder: Secondary | ICD-10-CM | POA: Diagnosis not present

## 2024-12-09 DIAGNOSIS — F411 Generalized anxiety disorder: Secondary | ICD-10-CM | POA: Diagnosis not present

## 2024-12-09 DIAGNOSIS — K0889 Other specified disorders of teeth and supporting structures: Secondary | ICD-10-CM | POA: Diagnosis not present

## 2024-12-09 DIAGNOSIS — K047 Periapical abscess without sinus: Secondary | ICD-10-CM | POA: Diagnosis not present

## 2024-12-11 DIAGNOSIS — F411 Generalized anxiety disorder: Secondary | ICD-10-CM | POA: Diagnosis not present

## 2024-12-13 ENCOUNTER — Other Ambulatory Visit: Payer: Self-pay

## 2024-12-13 NOTE — Progress Notes (Signed)
 BIKTARVY  RETURNED TO STOCK

## 2024-12-15 ENCOUNTER — Other Ambulatory Visit (HOSPITAL_COMMUNITY): Payer: Self-pay

## 2024-12-16 DIAGNOSIS — F411 Generalized anxiety disorder: Secondary | ICD-10-CM | POA: Diagnosis not present

## 2024-12-24 ENCOUNTER — Other Ambulatory Visit (HOSPITAL_COMMUNITY): Payer: Self-pay

## 2024-12-25 ENCOUNTER — Other Ambulatory Visit: Payer: Self-pay

## 2024-12-25 ENCOUNTER — Other Ambulatory Visit (HOSPITAL_COMMUNITY): Payer: Self-pay

## 2024-12-25 ENCOUNTER — Ambulatory Visit: Admitting: Internal Medicine

## 2025-01-05 ENCOUNTER — Telehealth: Payer: Self-pay

## 2025-01-05 NOTE — Telephone Encounter (Signed)
 Attempt to Re-Engage in Care  No office visit or HIV labs completed at [facility] within the last 12 months. Patient is considered out of care.   Last [facility] Visit: 08/25/23  Last HIV Viral Load:  HIV 1 RNA Quant  Date Value Ref Range Status  10/09/2024 <20 copies/mL Corrected    Comment:    (NOTE) HIV-1 RNA not detected The reportable range for this assay is 20 to 10,000,000 copies HIV-1 RNA/mL. Performed At: [facility] [address] [place], [place] [id number] Jennette Shorter MD Ey:[id number]     Last CD4 Count:  CD4 T Cell Abs  Date Value Ref Range Status  08/25/2023 1,023 400 - 1,790 /uL Final    Medication Dispense History:   Dispensed Sold Days Supply Quantity Provider Pharmacy  bictegravir-emtricitabine -tenofovir  AF (BIKTARVY ) 50-200-25 MG TABS tablet 12/25/2024 12/25/2024 30 30 tablet Dennise Kingsley, MD [place] [facility]  bictegravir-emtricitabine -tenofovir  AF (BIKTARVY ) 50-200-25 MG TABS tablet 11/03/2024 11/03/2024 30 30 tablet Dennise Kingsley, MD [place] [facility]  bictegravir-emtricitabine -tenofovir  AF (BIKTARVY ) 50-200-25 MG TABS tablet 09/01/2024 09/01/2024 30 30 tablet Dennise Kingsley, MD  - [facility]    Interventions: Called and spoke with [facility], scheduled appointment for 1/28.  Duration of Services: 5 minutes  Dorna Mallet, BSN, [facility]

## 2025-01-17 ENCOUNTER — Other Ambulatory Visit (HOSPITAL_COMMUNITY): Payer: Self-pay

## 2025-01-17 ENCOUNTER — Ambulatory Visit: Payer: Self-pay | Admitting: Internal Medicine

## 2025-01-18 ENCOUNTER — Other Ambulatory Visit: Payer: Self-pay | Admitting: Internal Medicine

## 2025-01-18 ENCOUNTER — Other Ambulatory Visit: Payer: Self-pay

## 2025-01-18 DIAGNOSIS — B20 Human immunodeficiency virus [HIV] disease: Secondary | ICD-10-CM

## 2025-01-19 ENCOUNTER — Other Ambulatory Visit: Payer: Self-pay

## 2025-01-19 ENCOUNTER — Other Ambulatory Visit (HOSPITAL_COMMUNITY): Payer: Self-pay

## 2025-01-19 NOTE — Telephone Encounter (Signed)
 Last dispensed 1/5, patient will receive additional refills at 2/5 appointment.  Quintavia Rogstad, BSN, RN

## 2025-01-25 ENCOUNTER — Other Ambulatory Visit: Payer: Self-pay

## 2025-01-25 ENCOUNTER — Ambulatory Visit: Admitting: Internal Medicine

## 2025-01-26 ENCOUNTER — Other Ambulatory Visit: Payer: Self-pay

## 2025-03-13 ENCOUNTER — Ambulatory Visit: Admitting: Internal Medicine
